# Patient Record
Sex: Female | Born: 1945 | Race: White | Hispanic: No | Marital: Married | Smoking: Former smoker
Health system: Southern US, Community
[De-identification: ages and names within clinical notes are randomized; demographics above are authoritative.]

## PROBLEM LIST (undated history)

## (undated) DIAGNOSIS — I10 Essential (primary) hypertension: Secondary | ICD-10-CM

## (undated) DIAGNOSIS — Z460 Encounter for fitting and adjustment of spectacles and contact lenses: Secondary | ICD-10-CM

## (undated) DIAGNOSIS — T8859XA Other complications of anesthesia, initial encounter: Secondary | ICD-10-CM

## (undated) DIAGNOSIS — E039 Hypothyroidism, unspecified: Secondary | ICD-10-CM

## (undated) DIAGNOSIS — D131 Benign neoplasm of stomach: Secondary | ICD-10-CM

## (undated) DIAGNOSIS — J189 Pneumonia, unspecified organism: Secondary | ICD-10-CM

## (undated) DIAGNOSIS — R569 Unspecified convulsions: Secondary | ICD-10-CM

## (undated) DIAGNOSIS — J8410 Pulmonary fibrosis, unspecified: Secondary | ICD-10-CM

## (undated) DIAGNOSIS — T4145XA Adverse effect of unspecified anesthetic, initial encounter: Secondary | ICD-10-CM

## (undated) DIAGNOSIS — Z800 Family history of malignant neoplasm of digestive organs: Secondary | ICD-10-CM

## (undated) DIAGNOSIS — K219 Gastro-esophageal reflux disease without esophagitis: Secondary | ICD-10-CM

## (undated) DIAGNOSIS — K635 Polyp of colon: Secondary | ICD-10-CM

## (undated) DIAGNOSIS — K222 Esophageal obstruction: Secondary | ICD-10-CM

## (undated) DIAGNOSIS — K52832 Lymphocytic colitis: Secondary | ICD-10-CM

## (undated) DIAGNOSIS — C801 Malignant (primary) neoplasm, unspecified: Secondary | ICD-10-CM

## (undated) DIAGNOSIS — D126 Benign neoplasm of colon, unspecified: Secondary | ICD-10-CM

## (undated) DIAGNOSIS — F329 Major depressive disorder, single episode, unspecified: Secondary | ICD-10-CM

## (undated) DIAGNOSIS — K449 Diaphragmatic hernia without obstruction or gangrene: Secondary | ICD-10-CM

## (undated) DIAGNOSIS — F411 Generalized anxiety disorder: Secondary | ICD-10-CM

## (undated) DIAGNOSIS — R0600 Dyspnea, unspecified: Secondary | ICD-10-CM

## (undated) DIAGNOSIS — D496 Neoplasm of unspecified behavior of brain: Secondary | ICD-10-CM

## (undated) DIAGNOSIS — E669 Obesity, unspecified: Secondary | ICD-10-CM

## (undated) DIAGNOSIS — IMO0002 Reserved for concepts with insufficient information to code with codable children: Secondary | ICD-10-CM

## (undated) DIAGNOSIS — E785 Hyperlipidemia, unspecified: Secondary | ICD-10-CM

## (undated) DIAGNOSIS — F3289 Other specified depressive episodes: Secondary | ICD-10-CM

## (undated) DIAGNOSIS — Z87442 Personal history of urinary calculi: Secondary | ICD-10-CM

## (undated) HISTORY — PX: APPENDECTOMY: SHX54

## (undated) HISTORY — DX: Polyp of colon: K63.5

## (undated) HISTORY — DX: Diaphragmatic hernia without obstruction or gangrene: K44.9

## (undated) HISTORY — DX: Gastro-esophageal reflux disease without esophagitis: K21.9

## (undated) HISTORY — DX: Obesity, unspecified: E66.9

## (undated) HISTORY — DX: Hypothyroidism, unspecified: E03.9

## (undated) HISTORY — DX: Major depressive disorder, single episode, unspecified: F32.9

## (undated) HISTORY — DX: Esophageal obstruction: K22.2

## (undated) HISTORY — DX: Pulmonary fibrosis, unspecified: J84.10

## (undated) HISTORY — DX: Benign neoplasm of colon, unspecified: D12.6

## (undated) HISTORY — PX: BACK SURGERY: SHX140

## (undated) HISTORY — PX: ABDOMINAL HYSTERECTOMY: SHX81

## (undated) HISTORY — PX: CARPAL TUNNEL RELEASE: SHX101

## (undated) HISTORY — PX: EYE SURGERY: SHX253

## (undated) HISTORY — DX: Generalized anxiety disorder: F41.1

## (undated) HISTORY — DX: Reserved for concepts with insufficient information to code with codable children: IMO0002

## (undated) HISTORY — DX: Other specified depressive episodes: F32.89

## (undated) HISTORY — DX: Hyperlipidemia, unspecified: E78.5

## (undated) HISTORY — DX: Lymphocytic colitis: K52.832

## (undated) HISTORY — DX: Benign neoplasm of stomach: D13.1

## (undated) HISTORY — PX: CRANIOTOMY: SHX93

## (undated) HISTORY — DX: Family history of malignant neoplasm of digestive organs: Z80.0

---
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
## 1981-10-29 ENCOUNTER — Encounter (INDEPENDENT_AMBULATORY_CARE_PROVIDER_SITE_OTHER): Payer: Self-pay | Admitting: *Deleted

## 1982-03-04 ENCOUNTER — Encounter (INDEPENDENT_AMBULATORY_CARE_PROVIDER_SITE_OTHER): Payer: Self-pay | Admitting: *Deleted

## 1987-04-19 ENCOUNTER — Encounter (INDEPENDENT_AMBULATORY_CARE_PROVIDER_SITE_OTHER): Payer: Self-pay | Admitting: *Deleted

## 1995-11-10 ENCOUNTER — Encounter (INDEPENDENT_AMBULATORY_CARE_PROVIDER_SITE_OTHER): Payer: Self-pay | Admitting: *Deleted

## 1997-07-27 HISTORY — PX: BRAIN SURGERY: SHX531

## 1997-08-31 ENCOUNTER — Encounter (INDEPENDENT_AMBULATORY_CARE_PROVIDER_SITE_OTHER): Payer: Self-pay | Admitting: *Deleted

## 1997-09-13 ENCOUNTER — Encounter: Payer: Self-pay | Admitting: Internal Medicine

## 1997-11-26 ENCOUNTER — Encounter (INDEPENDENT_AMBULATORY_CARE_PROVIDER_SITE_OTHER): Payer: Self-pay | Admitting: *Deleted

## 1998-01-03 ENCOUNTER — Inpatient Hospital Stay (HOSPITAL_COMMUNITY): Admission: EM | Admit: 1998-01-03 | Discharge: 1998-01-08 | Payer: Self-pay | Admitting: *Deleted

## 1998-01-08 ENCOUNTER — Inpatient Hospital Stay (HOSPITAL_COMMUNITY)
Admission: RE | Admit: 1998-01-08 | Discharge: 1998-02-01 | Payer: Self-pay | Admitting: Physical Medicine and Rehabilitation

## 1998-02-04 ENCOUNTER — Encounter
Admission: RE | Admit: 1998-02-04 | Discharge: 1998-05-05 | Payer: Self-pay | Admitting: Physical Medicine and Rehabilitation

## 1998-04-27 ENCOUNTER — Encounter
Admission: RE | Admit: 1998-04-27 | Discharge: 1998-07-26 | Payer: Self-pay | Admitting: Physical Medicine and Rehabilitation

## 1998-11-26 ENCOUNTER — Encounter (INDEPENDENT_AMBULATORY_CARE_PROVIDER_SITE_OTHER): Payer: Self-pay | Admitting: *Deleted

## 1999-09-22 ENCOUNTER — Encounter
Admission: RE | Admit: 1999-09-22 | Discharge: 1999-12-21 | Payer: Self-pay | Admitting: Physical Medicine and Rehabilitation

## 1999-10-01 ENCOUNTER — Ambulatory Visit (HOSPITAL_COMMUNITY): Admission: RE | Admit: 1999-10-01 | Discharge: 1999-10-01 | Payer: Self-pay | Admitting: Neurosurgery

## 1999-10-01 ENCOUNTER — Encounter: Payer: Self-pay | Admitting: Neurosurgery

## 2001-02-18 ENCOUNTER — Encounter (INDEPENDENT_AMBULATORY_CARE_PROVIDER_SITE_OTHER): Payer: Self-pay | Admitting: *Deleted

## 2002-02-27 ENCOUNTER — Encounter (INDEPENDENT_AMBULATORY_CARE_PROVIDER_SITE_OTHER): Payer: Self-pay | Admitting: *Deleted

## 2002-04-11 ENCOUNTER — Encounter: Admission: RE | Admit: 2002-04-11 | Discharge: 2002-07-10 | Payer: Self-pay | Admitting: *Deleted

## 2003-03-09 ENCOUNTER — Encounter: Payer: Self-pay | Admitting: Neurosurgery

## 2003-03-13 ENCOUNTER — Encounter: Payer: Self-pay | Admitting: Neurosurgery

## 2003-03-13 ENCOUNTER — Inpatient Hospital Stay (HOSPITAL_COMMUNITY): Admission: RE | Admit: 2003-03-13 | Discharge: 2003-03-16 | Payer: Self-pay | Admitting: Neurosurgery

## 2003-05-09 ENCOUNTER — Encounter: Admission: RE | Admit: 2003-05-09 | Discharge: 2003-07-26 | Payer: Self-pay | Admitting: Neurosurgery

## 2004-05-06 ENCOUNTER — Encounter: Payer: Self-pay | Admitting: Internal Medicine

## 2006-09-09 ENCOUNTER — Encounter: Admission: RE | Admit: 2006-09-09 | Discharge: 2006-11-18 | Payer: Self-pay | Admitting: Orthopedic Surgery

## 2006-10-05 ENCOUNTER — Ambulatory Visit: Payer: Self-pay | Admitting: Licensed Clinical Social Worker

## 2006-10-19 ENCOUNTER — Ambulatory Visit: Payer: Self-pay | Admitting: Licensed Clinical Social Worker

## 2006-10-29 ENCOUNTER — Ambulatory Visit: Payer: Self-pay | Admitting: Licensed Clinical Social Worker

## 2006-11-09 ENCOUNTER — Ambulatory Visit: Payer: Self-pay | Admitting: Licensed Clinical Social Worker

## 2006-11-19 ENCOUNTER — Ambulatory Visit: Payer: Self-pay | Admitting: Licensed Clinical Social Worker

## 2006-11-29 ENCOUNTER — Ambulatory Visit: Payer: Self-pay | Admitting: Licensed Clinical Social Worker

## 2006-12-14 ENCOUNTER — Ambulatory Visit: Payer: Self-pay | Admitting: Licensed Clinical Social Worker

## 2007-09-01 ENCOUNTER — Encounter: Admission: RE | Admit: 2007-09-01 | Discharge: 2007-09-01 | Payer: Self-pay | Admitting: Neurosurgery

## 2007-09-12 ENCOUNTER — Ambulatory Visit: Payer: Self-pay | Admitting: Internal Medicine

## 2007-09-30 ENCOUNTER — Ambulatory Visit: Payer: Self-pay | Admitting: Internal Medicine

## 2007-12-16 ENCOUNTER — Encounter: Admission: RE | Admit: 2007-12-16 | Discharge: 2007-12-16 | Payer: Self-pay | Admitting: Orthopedic Surgery

## 2008-11-22 DIAGNOSIS — K649 Unspecified hemorrhoids: Secondary | ICD-10-CM | POA: Insufficient documentation

## 2008-11-22 DIAGNOSIS — D126 Benign neoplasm of colon, unspecified: Secondary | ICD-10-CM | POA: Insufficient documentation

## 2008-11-22 DIAGNOSIS — F411 Generalized anxiety disorder: Secondary | ICD-10-CM | POA: Insufficient documentation

## 2008-11-22 DIAGNOSIS — IMO0002 Reserved for concepts with insufficient information to code with codable children: Secondary | ICD-10-CM

## 2008-11-22 DIAGNOSIS — E785 Hyperlipidemia, unspecified: Secondary | ICD-10-CM

## 2008-11-22 DIAGNOSIS — I499 Cardiac arrhythmia, unspecified: Secondary | ICD-10-CM | POA: Insufficient documentation

## 2008-11-22 DIAGNOSIS — K219 Gastro-esophageal reflux disease without esophagitis: Secondary | ICD-10-CM

## 2008-11-22 DIAGNOSIS — K449 Diaphragmatic hernia without obstruction or gangrene: Secondary | ICD-10-CM

## 2008-11-22 DIAGNOSIS — F329 Major depressive disorder, single episode, unspecified: Secondary | ICD-10-CM

## 2008-12-10 ENCOUNTER — Ambulatory Visit: Payer: Self-pay | Admitting: Internal Medicine

## 2008-12-10 DIAGNOSIS — K648 Other hemorrhoids: Secondary | ICD-10-CM | POA: Insufficient documentation

## 2008-12-10 DIAGNOSIS — K644 Residual hemorrhoidal skin tags: Secondary | ICD-10-CM | POA: Insufficient documentation

## 2008-12-10 DIAGNOSIS — Z860100 Personal history of colon polyps, unspecified: Secondary | ICD-10-CM | POA: Insufficient documentation

## 2010-04-15 ENCOUNTER — Encounter
Admission: RE | Admit: 2010-04-15 | Discharge: 2010-07-01 | Payer: Self-pay | Source: Home / Self Care | Admitting: Family Medicine

## 2010-07-23 ENCOUNTER — Telehealth: Payer: Self-pay | Admitting: Internal Medicine

## 2010-07-24 ENCOUNTER — Ambulatory Visit
Admission: RE | Admit: 2010-07-24 | Discharge: 2010-07-24 | Payer: Self-pay | Source: Home / Self Care | Attending: Internal Medicine | Admitting: Internal Medicine

## 2010-07-24 DIAGNOSIS — R197 Diarrhea, unspecified: Secondary | ICD-10-CM

## 2010-07-25 ENCOUNTER — Encounter: Payer: Self-pay | Admitting: Nurse Practitioner

## 2010-08-28 NOTE — Progress Notes (Signed)
Summary: Triage   Phone Note Call from Patient Call back at Home Phone [phone] Call back at [phone]   Caller: Patient Call For: Dr. Marina Goodell Reason for Call: Talk to Nurse Summary of Call: pt has had diarrhea for two weeks and she wants to been seen ASAP Initial call taken by: [place] Johnson,  July 23, 2010 4:48 PM  Follow-up for Phone Call        Loose stools x 2 weeks using Immodium but even sm. amts. of soft foods precipitate stool. Denies blood in stool and has no pain. Poor appetite.Given appt. with N.P. for this afternoon as Dr.Obadiah Dennard is supervising dr. No hx. of recent antibiotics. Follow-up by: Teryl Lucy RN,  July 24, 2010 8:16 AM

## 2010-08-28 NOTE — Assessment & Plan Note (Signed)
Summary: diarrhea x2 weeks    History of Present Illness Visit Type: Follow-up Visit Primary GI MD: Yancey Flemings MD Primary Provider: Yancey Flemings MD Chief Complaint: Diarrhea for [redacted]  weeks along with rectal bleeding for 2 weeks, Pt had to change to generic Lipitor, Changes in bowel habits after that History of Present Illness:   Patient is a 65 year old white female with a history of GERD,  colon polyps, hypertension, dyslipidemia. Here for a two week history of diarrhea. Having 3-4 loose stools a day, some of which are nocturnal. There is associated rectal bleeding (tissue) but that isn't new for her, she has known hemorrhoids.  She complains of urgency. Her stomach is noisy but she doesn't have any pain.  Immodium does help. No nausea. No fevers.  Recently started generic for Lipitor, no other new medications. She tried stopping the medication but didn't make any difference in the diarrha. No antibiotics in last few months. No sick contacts or out of country travel.  Colon polyps tubular adenoma March 2011   GI Review of Systems      Denies abdominal pain, acid reflux, belching, bloating, chest pain, dysphagia with liquids, dysphagia with solids, heartburn, loss of appetite, nausea, vomiting, vomiting blood, weight loss, and  weight gain.      Reports change in bowel habits, diarrhea, hemorrhoids, and  rectal bleeding.     Denies anal fissure, black tarry stools, constipation, diverticulosis, fecal incontinence, heme positive stool, irritable bowel syndrome, jaundice, light color stool, liver problems, and  rectal pain.    Current Medications (verified): 1)  Lipitor 40 Mg Tabs (Atorvastatin Calcium) .Marland Kitchen.. 1 By Mouth Once Daily 2)  Lipitor 20 Mg Tabs (Atorvastatin Calcium) .Marland Kitchen.. 1 Tablet By Mouth Once Daily 3)  Effexor Xr 75 Mg Xr24h-Cap (Venlafaxine Hcl) .Marland Kitchen.. 1 Tablet By Mouth Once Daily 4)  Hydrochlorothiazide 25 Mg Tabs (Hydrochlorothiazide) .Marland Kitchen.. 1 Tablet By Mouth Once Daily 5)  Caltrate  600+d 600-400 Mg-Unit Tabs (Calcium Carbonate-Vitamin D) .... 2 Tablets By Mouth Once Daily 6)  Vitamin C Cr 1000 Mg Cr-Tabs (Ascorbic Acid) .Marland Kitchen.. 1 Tablet By Mouth Once Daily 7)  Multivitamins  Tabs (Multiple Vitamin) .Marland Kitchen.. 1 Tablet By Mouth Once Daily 8)  Nabumetone 500 Mg Tabs (Nabumetone) .Marland Kitchen.. 1 Tablet By Mouth Two Times A Day 9)  Aspirin Ec 81 Mg Tbec (Aspirin) .Marland Kitchen.. 1 Tablet By Mouth Two Times A Day 10)  Nexium 40 Mg Cpdr (Esomeprazole Magnesium) .Marland Kitchen.. 1 Capsule By Mouth Once Daily 11)  Kls Aller-Tec 10 Mg Tabs (Cetirizine Hcl) 12)  Anusol-Hc 25 Mg Supp (Hydrocortisone Acetate) .... Insert 1 Per Rectum As Needed At Bedtime 13)  Anamantle Hc 3-0.5 % Crea (Lidocaine-Hydrocortisone Ace) .... Apply To Rectum As Needed 14)  Melatonin 5 Mg Caps (Melatonin) .... 2 By Mouth Once Daily  Allergies (verified): 1)  ! 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 1/2 pack a day Alcohol Use - yes: one drink a day Daily Caffeine Use: 4 cups a day Illicit Drug Use - no Patient  does not get regular exercise.   Review of Systems  The patient denies allergy/sinus, anemia, anxiety-new, arthritis/joint pain, back pain, blood in urine, breast changes/lumps, change in vision, confusion, cough, coughing up blood, depression-new, fainting, fatigue,  fever, headaches-new, hearing problems, heart murmur, heart rhythm changes, itching, menstrual pain, muscle pains/cramps, night sweats, nosebleeds, pregnancy symptoms, shortness of breath, skin rash, sleeping problems, sore throat, swelling of feet/legs, swollen lymph glands, thirst - excessive , urination - excessive , urination changes/pain, urine leakage, vision changes, and voice change.    Vital Signs:  Patient profile:   65 year old female Height:      65 inches Weight:      195 pounds  Vitals Entered By: Merri Ray CMA Duncan Dull) (July 24, 2010 3:26 PM)  Physical Exam  General:  Well developed, well nourished, no acute distress. Head:  Normocephalic and atraumatic. Eyes:  Conjunctiva pink, no icterus.  Neck:  no obvious masses  Chest Wall:  Symmetrical;  no deformities or tenderness. Lungs:  Clear throughout to auscultation. Heart:  Regular rate and rhythm;  Soft Murmur Abdomen:  Abdomen soft, nontender, nondistended. No obvious masses or hepatomegaly.Normal bowel sounds.  Rectal:  Huge external and internal hemorrhoids. Msk:  Symmetrical with no gross deformities. Normal posture. Extremities:  No palmar erythema, no edema.  Neurologic:  Alert and  oriented x4;  grossly normal neurologically. Skin:  Intact without significant lesions or rashes. Cervical Nodes:  No significant cervical adenopathy. Psych:  Alert and cooperative. Normal mood and affect.   Impression & Recommendations:  Problem # 1:  DIARRHEA-PRESUMED INFECTIOUS (ICD-009.3) Assessment New Two weeks of diarrhea, rule out infectious etiology. Abdominal exam benign, she looks okay. Will obtain stool studies, treat empirically with Flagyl. Low residue diet until antibiotics complete. Follow up with Dr. Marina Goodell.  Orders: T-C diff by PCR ([id number]) T-Culture, Stool ([id number]) T-Fecal WBC ([id number])  Problem # 2:  FAMILY HX COLON CANCER (ICD-V16.0) Assessment: Comment Only Up to date on her  colonoscopy  Problem # 3:  HEMORRHOIDS-INTERNAL (ICD-455.0) Assessment: Deteriorated Large external and internal hemorrhoids as well. Internal hemorrhoids largely inflamed. Patient continues to have rectal bleeding, especially if she doesn't use Anamantle on daily basis. Restart Analmantle daily for one week    Orders: T-C diff by PCR ([id number]) T-Culture, Stool ([id number]) T-Fecal WBC ([id number])  Patient Instructions: 1)  Please go to lab, basement level. 2)  We sent a perscription  for Metronidazole to [facility] [address] 3)  Continue Anamantle cream for 1 week.  4)  We made you an appointment with Dr Marina Goodell for Sep 15, 2010.   5)  Appointment card given. 6)  The medication list was reviewed and reconciled.  All changed / newly prescribed medications were explained.  A complete medication list was provided to the patient / caregiver. Prescriptions: METRONIDAZOLE 500 MG TABS (METRONIDAZOLE) Take tab 3 times x 10 days  #30 x 0   Entered by:   Lowry Ram NCMA   Authorized by:   Willette Cluster NP   Signed by:   Lowry Ram NCMA on 07/24/2010   Method used:   Electronically to        [facility] [address] #[id number]* (retail)       [address]       [place], [place]  [address]       Ph: [phone]       Fax: [phone]   RxID:   [id number]  ANUSOL-HC 25 MG SUPP (HYDROCORTISONE ACETATE) insert 1 Per Rectum as needed at bedtime  #24 x 0   Entered by:   Lowry Ram NCMA   Authorized by:   Willette Cluster NP   Signed by:   Lowry Ram NCMA on 07/24/2010   Method used:   Electronically to        [facility] [address] #[id number]* (retail)       [address]       [place], [place]  [address]       Ph: [phone]       Fax: [phone]   RxID:   [id number]  Pt decided to use [facility]. I called both perscriptions in.

## 2010-09-15 ENCOUNTER — Ambulatory Visit (INDEPENDENT_AMBULATORY_CARE_PROVIDER_SITE_OTHER): Payer: BC Managed Care – PPO | Admitting: Internal Medicine

## 2010-09-15 ENCOUNTER — Encounter: Payer: Self-pay | Admitting: Internal Medicine

## 2010-09-15 ENCOUNTER — Telehealth: Payer: Self-pay | Admitting: Internal Medicine

## 2010-09-15 DIAGNOSIS — Z9283 Personal history of failed moderate sedation: Secondary | ICD-10-CM

## 2010-09-15 DIAGNOSIS — R197 Diarrhea, unspecified: Secondary | ICD-10-CM

## 2010-09-15 DIAGNOSIS — Z86010 Personal history of colonic polyps: Secondary | ICD-10-CM

## 2010-09-23 NOTE — Procedures (Signed)
Summary: Colonoscopy/[place]  Colonoscopy/[place]   Imported By: Sherian Rein 09/16/2010 12:25:04  _____________________________________________________________________  External Attachment:    Type:   Image     Comment:   External Document

## 2010-09-23 NOTE — Progress Notes (Signed)
Summary[facility]   Imported By: Sherian Rein 09/16/2010 12:30:48  _____________________________________________________________________  External Attachment:    Type:   Image     Comment:   External Document

## 2010-09-23 NOTE — Assessment & Plan Note (Signed)
Summary: Follow up for diarrhea and rectal bleeding     History of Present Illness Visit Type: Follow-up Visit Primary GI MD: Yancey Flemings MD Primary Provider: Leverne Humbles, MD  Requesting Provider: na Chief Complaint: F/u from office visit with Gunnar Fusi for rectal bleeding and diarrhea. 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 She presents today for followup. She was seen in 7 weeks ago for a 2 week history of diarrhea and rectal bleeding. Bleeding secondary to hemorrhoids. Prescribe topical therapy. Stool studies negative except for  lactoferrin. Prescribed metronidazole without help. Stop Lipitor without help. Imodium ineffective. Some abnormal cramping with urgency. 4 episodes of incontinence , most recently last week. history of 4 in her small bowel movements per day. last colonoscopy, for family history of colon cancer in her mother , March 2009. the exam revealed hemorrhoids and  adenomatous polyps.   GI Review of Systems      Denies abdominal pain, acid reflux, belching, bloating, chest pain, dysphagia with liquids, dysphagia with solids, heartburn, loss of appetite, nausea, vomiting, vomiting blood, weight loss, and  weight gain.      Reports change in bowel habits, fecal incontinence, hemorrhoids, and  rectal bleeding.     Denies anal fissure, black tarry stools, constipation, diarrhea, diverticulosis, heme positive stool, irritable bowel syndrome, jaundice, light color stool, liver problems, and  rectal pain.    Current Medications (verified): 1)  Lipitor 40 Mg Tabs (Atorvastatin Calcium) .Marland Kitchen.. 1 By Mouth Once Daily 2)  Lipitor 20 Mg Tabs (Atorvastatin Calcium) .Marland Kitchen.. 1 Tablet By Mouth Once Daily 3)  Effexor Xr 75 Mg Xr24h-Cap (Venlafaxine Hcl) .... 3 Tablets By Mouth Once  Daily 4)  Hydrochlorothiazide 25 Mg Tabs (Hydrochlorothiazide) .Marland Kitchen.. 1 Tablet By Mouth Once Daily 5)  Caltrate 600+d 600-400 Mg-Unit Tabs (Calcium Carbonate-Vitamin D) .Marland Kitchen.. 1 Tablet By Mouth Two Times A Day 6)  Vitamin C Cr 1000 Mg Cr-Tabs (Ascorbic Acid) .Marland Kitchen.. 1 Tablet By Mouth Once Daily 7)  Multivitamins  Tabs (Multiple Vitamin) .Marland Kitchen.. 1 Tablet By Mouth Once Daily 8)  Nabumetone 500 Mg Tabs (Nabumetone) .Marland Kitchen.. 1 Tablet By Mouth Two Times A Day 9)  Aspirin Ec 81 Mg Tbec (Aspirin) .Marland Kitchen.. 1 Tablet By Mouth Two Times A Day 10)  Nexium 40 Mg Cpdr (Esomeprazole Magnesium) .Marland Kitchen.. 1 Capsule By Mouth Once Daily 11)  Kls Aller-Tec 10 Mg Tabs (Cetirizine Hcl) .... One Tablet By Mouth Once Daily 12)  Anusol-Hc 25 Mg Supp (Hydrocortisone Acetate) .... Insert 1 Per Rectum As Needed At Bedtime 13)  Anamantle Hc 3-0.5 % Crea (Lidocaine-Hydrocortisone Ace) .... Apply To Rectum As Needed 14)  Melatonin 5 Mg Caps (Melatonin) .... 2 By Mouth Once Daily 15)  Synthroid 50 Mcg Tabs (Levothyroxine Sodium) .... One Tablet By Mouth Once Daily  Allergies (verified): 1)  ! 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 craniotomy back surgery hysterectomy appendectomy Carpal Tunnel Release  Family History: Reviewed history from 12/10/2008 and no changes required. Family History of Breast Cancer: Mother  Family History of Colon Cancer:Mother  Lung Cancer: Uncle Bone Cancer: Grandmother MI: Father  Social History:  Reviewed history from 12/10/2008 and no changes required. Occupation: Retired Patient is a former smoker. 1/2 pack a day Alcohol Use - yes: one drink a day Daily Caffeine Use: 4 cups a  day Illicit Drug Use - no Patient does not get regular exercise.   Review of Systems  The patient denies allergy/sinus, anemia, anxiety-new, arthritis/joint pain, back pain, blood in urine, breast changes/lumps, change in vision, confusion, cough, coughing up blood, depression-new, fainting, fatigue, fever, headaches-new, hearing problems, heart murmur, heart rhythm changes, itching, menstrual pain, muscle pains/cramps, night sweats, nosebleeds, pregnancy symptoms, shortness of breath, skin rash, sleeping problems, sore throat, swelling of feet/legs, swollen lymph glands, thirst - excessive , urination - excessive , urination changes/pain, urine leakage, vision changes, and voice change.    Vital Signs:  Patient profile:   65 year old female Height:      65 inches Weight:      201 pounds BMI:     33.57 BSA:     1.98 Pulse rate:   72 / minute Pulse rhythm:   regular BP sitting:   128 / 64  (left arm) Cuff size:   regular  Vitals Entered By: Ok Anis CMA (September 15, 2010 1:50 PM)  Physical Exam  General:  Well developed, well nourished, no acute distress. Head:  Normocephalic and atraumatic. Eyes:  PERRLA, no icterus. Mouth:  No deformity or lesions,. Neck:  Supple; no masses or thyromegaly. Lungs:  Clear throughout to auscultation. Heart:  Regular rate and rhythm; no murmurs, rubs,  or bruits. Abdomen:  Soft, nontender and nondistended. No masses, hepatosplenomegaly or hernias noted. Normal bowel sounds. Rectal:   deferred Msk:  Symmetrical with no gross deformities. Normal posture. Pulses:  Normal pulses noted. Extremities:   no edema Neurologic:  Alert and  oriented x   Impression & Recommendations:  Problem # 1:  DIARRHEA (ICD-787.91)  9 week history of change in bowel habits. Now with cramping followed by urgency and loose stools. Some incontinence. suspect post infectious IBS or possibly microscopic colitis.   Plan :   #1. colonoscopy with biopsies. the nature of  the procedure as well as the risks, benefits, and alternatives were reviewed. she understood and agreed to proceed.  movi prep  prescribed. the patient instructed on  its use  #2. Difficult to sedate last time. We'll use propofol  Problem # 2:  PERSONAL HISTORY OF FAILED MODERATE SEDATION (ICD-V15.80) Assessment: Comment Only  Problem # 3:  PERSONAL HX COLONIC POLYPS (ICD-V12.72)  personal history of adenomatous polyps in 2009.  Problem # 4:  FAMILY HX COLON CANCER (ICD-V16.0)  mother  in her 23s  Problem # 5:  HEMORRHOIDS-INTERNAL (ICD-455.0)  likely cause for intermittent bleeding. will assess further colonoscopy.  Other Orders: Colonoscopy (Colon)  Patient Instructions: 1)  colonoscopy with Propoful 10/03/10 9:30 am arrive at 8:30 am 2)  Movi prep instructions given. 3)  Movi prep prescription sent to pharmacy for you to pick up. 4)  Colonoscopy and Flexible Sigmoidoscopy brochure given.  5)  Copy sent to : Leverne Humbles, MD  6)  The medication list was reviewed and reconciled.  All changed / newly prescribed medications were explained.  A complete medication list was provided to the patient / caregiver. Prescriptions: MOVIPREP 100 GM  SOLR (PEG-KCL-NACL-NASULF-NA ASC-C) As per prep instructions.  #1 x 0   Entered by:   Milford Cage NCMA   Authorized by:   Hilarie Fredrickson MD   Signed by:   Britta Mccreedy  Kem Boroughs on 09/15/2010   Method used:   Electronically to        [facility] #[id number]* (retail)       [address]       [place], [place]  [address]       Ph: [phone]       Fax: [phone]   RxID:   [id number]

## 2010-09-23 NOTE — Progress Notes (Signed)
Summary: resend rx    Phone Note Call from Patient Call back at Home Phone [phone]   Caller: Patient Call For: Dr Marina Goodell Summary of Call: rx was sent to the wrong pharmacy [phone] Initial call taken by: Tawni Levy,  September 15, 2010 4:58 PM    Prescriptions: MOVIPREP 100 GM  SOLR (PEG-KCL-NACL-NASULF-NA ASC-C) As per prep instructions.  #1 x 0   Entered by:   Milford Cage NCMA   Authorized by:   Hilarie Fredrickson MD   Signed by:   Russie Gulledge NCMA on 09/17/2010   Method used:   [phone]       [address]       [place], [place]  [address]       Ph: [phone]       Fax: [phone]   RxID:   [id number]

## 2010-09-23 NOTE — Procedures (Signed)
Summary: Colonoscopy/[place]  Colonoscopy/   Imported By: Sherian Rein 09/16/2010 12:21:22  _____________________________________________________________________  External Attachment:    Type:   Image     Comment:   External Document

## 2010-09-23 NOTE — Progress Notes (Signed)
[facility]   Imported By: Sherian Rein 09/16/2010 12:35:06  _____________________________________________________________________  External Attachment:    Type:   Image     Comment:   External Document

## 2010-09-23 NOTE — Letter (Signed)
Summary: [facility] Instructions  [facility]  [address] [place], [place] [address]   Phone: [phone]  Fax: [phone]       SHANEKQUA SCHAPER    1945-09-10    MRN: [id number]        Procedure Day /Date:FRIDAY 10/03/10     Arrival Time:8:30 AM     Procedure Time:9:30 AM     Location of Procedure:                    [facility] (4th Floor)  PREPARATION FOR COLONOSCOPY WITH MOVIPREP WITH PROPOFUL   Starting 5 days prior to your procedure _ _ do not eat nuts, seeds, popcorn, corn, beans, peas,  salads, or any raw vegetables.  Do not take any fiber supplements (e.g. Metamucil, Citrucel, and Benefiber).  THE DAY BEFORE YOUR PROCEDURE         DATE: 10/02/10  DAY: THURSDAY  1.  Drink clear liquids the entire day-NO SOLID FOOD  2.  Do not drink anything colored red or purple.  Avoid juices with pulp.  No orange juice.  3.  Drink at least 64 oz. (8 glasses) of fluid/clear liquids during the day to prevent dehydration and help the prep work efficiently.  CLEAR LIQUIDS INCLUDE: Water Jello Ice Popsicles Tea (sugar ok, no milk/cream) Powdered fruit flavored drinks Coffee (sugar ok, no milk/cream) Gatorade Juice: apple, white grape, white cranberry  Lemonade Clear bullion, consomm, broth Carbonated beverages (any kind) Strained chicken noodle soup Hard Candy                             4.  In the morning, mix first dose of MoviPrep solution:    Empty 1 Pouch A and 1 Pouch B into the disposable container    Add lukewarm drinking water to the top line of the container. Mix to dissolve    Refrigerate (mixed solution should be used within 24 hrs)  5.  Begin drinking the prep at 5:00 p.m. The MoviPrep container is divided by 4 marks.   Every 15 minutes drink the solution down to the next mark (approximately 8 oz) until the full liter is complete.   6.  Follow completed prep with 16 oz of clear liquid of your choice (Nothing red or purple).   Continue to drink clear liquids until bedtime.  7.  Before going to bed, mix second dose of MoviPrep solution:    Empty 1 Pouch A and 1 Pouch B into the disposable container    Add lukewarm drinking water to the top line of the container. Mix to dissolve    Refrigerate  THE DAY OF YOUR PROCEDURE      DATE: 10/03/10 DAY: FRIDAY  Beginning at 4:30 a.m. (5 hours before procedure):         1. Every 15 minutes, drink the solution down to the next mark (approx 8 oz) until the full liter is complete.  2. Follow completed prep with 16 oz. of clear liquid of your choice.    3. You may drink clear liquids until 7:30 AM (2 HOURS BEFORE PROCEDURE).   MEDICATION INSTRUCTIONS  Unless otherwise instructed, you should take regular prescription medications with a small sip of water   as early as possible the morning of your procedure.         OTHER INSTRUCTIONS  You will need a responsible adult at least 66 years of  age to accompany you and drive you home.   This person must remain in the waiting room during your procedure.  Wear loose fitting clothing that is easily removed.  Leave jewelry and other valuables at home.  However, you may wish to bring a book to read or  an iPod/MP3 player to listen to music as you wait for your procedure to start.  Remove all body piercing jewelry and leave at home.  Total time from sign-in until discharge is approximately 2-3 hours.  You should go home directly after your procedure and rest.  You can resume normal activities the  day after your procedure.  The day of your procedure you should not:   Drive   Make legal decisions   Operate machinery   Drink alcohol   Return to work  You will receive specific instructions about eating, activities and medications before you leave.    The above instructions have been reviewed and explained to me by   _______________________    I fully understand and can verbalize these instructions  _____________________________ Date _________

## 2010-09-23 NOTE — Progress Notes (Signed)
[facility]   Imported By: Sherian Rein 09/16/2010 12:32:22  _____________________________________________________________________  External Attachment:    Type:   Image     Comment:   External Document

## 2010-09-23 NOTE — Progress Notes (Signed)
Summary: [facility]   Imported By: Sherian Rein 09/16/2010 12:33:45  _____________________________________________________________________  External Attachment:    Type:   Image     Comment:   External Document

## 2010-09-23 NOTE — Progress Notes (Signed)
Summary: [facility]   Imported By: Sherian Rein 09/16/2010 12:36:21  _____________________________________________________________________  External Attachment:    Type:   Image     Comment:   External Document

## 2010-09-23 NOTE — Procedures (Signed)
Summary: Panendoscopy/  Panendoscopy/   Imported By: Sherian Rein 09/16/2010 12:23:00  _____________________________________________________________________  External Attachment:    Type:   Image     Comment:   External Document

## 2010-09-23 NOTE — Progress Notes (Signed)
Summary: [facility]   Imported By: Sherian Rein 09/16/2010 12:29:47  _____________________________________________________________________  External Attachment:    Type:   Image     Comment:   External Document

## 2010-10-03 ENCOUNTER — Encounter: Payer: Self-pay | Admitting: Internal Medicine

## 2010-10-03 ENCOUNTER — Other Ambulatory Visit: Payer: Self-pay | Admitting: Internal Medicine

## 2010-10-03 ENCOUNTER — Encounter (AMBULATORY_SURGERY_CENTER): Payer: BC Managed Care – PPO | Admitting: Internal Medicine

## 2010-10-03 DIAGNOSIS — K5289 Other specified noninfective gastroenteritis and colitis: Secondary | ICD-10-CM

## 2010-10-03 DIAGNOSIS — R197 Diarrhea, unspecified: Secondary | ICD-10-CM

## 2010-10-03 DIAGNOSIS — Z86010 Personal history of colonic polyps: Secondary | ICD-10-CM

## 2010-10-03 DIAGNOSIS — K648 Other hemorrhoids: Secondary | ICD-10-CM

## 2010-10-03 DIAGNOSIS — Z1211 Encounter for screening for malignant neoplasm of colon: Secondary | ICD-10-CM

## 2010-10-06 ENCOUNTER — Encounter: Payer: Self-pay | Admitting: Internal Medicine

## 2010-10-07 NOTE — Miscellaneous (Signed)
Summary: Levsinex rx  Clinical Lists Changes  Medications: Added new medication of HYOSCYAMINE SULFATE CR 0.375 MG XR12H-TAB (HYOSCYAMINE SULFATE) 1 by mouth two times a day - Signed Rx of HYOSCYAMINE SULFATE CR 0.375 MG XR12H-TAB (HYOSCYAMINE SULFATE) 1 by mouth two times a day;  #60 x 3;  Signed;  Entered by: Jennye Boroughs RN;  Authorized by: Hilarie Fredrickson MD;  Method used: [facility] [phone]*, [address] [place], [place], [place]  [address], Ph: [phone], Fax: [phone]    Prescriptions: HYOSCYAMINE SULFATE CR 0.375 MG XR12H-TAB (HYOSCYAMINE SULFATE) 1 by mouth two times a day  #60 x 3   Entered by:   Jennye Boroughs RN   Authorized by:   Hilarie Fredrickson MD   Signed by:   Jennye Boroughs RN on 10/03/2010   Method used:   Electronically to        [facility] [phone]* (retail)       [address]       [place], [place]  [address]       Ph: [phone]       Fax: [phone]   RxID:   [id number]

## 2010-10-07 NOTE — Procedures (Addendum)
Summary: Colonoscopy  Patient: [name] Note: All result statuses are Final unless otherwise noted.  Tests: (1) Colonoscopy (COL)   COL Colonoscopy           DONE     [facility]     [address]     [place], [place]  [address]          COLONOSCOPY PROCEDURE REPORT          PATIENT:  [name], [name]  MR#:  [id number]     BIRTHDATE:  May 09, 1946, 64 yrs. old  GENDER:  female     ENDOSCOPIST:  Wilhemina Bonito. Eda Keys, MD     REF. BY:  Office     PROCEDURE DATE:  10/03/2010     PROCEDURE:  Colonoscopy with biopsies     ASA CLASS:  Class II     INDICATIONS:  history of pre-cancerous (adenomatous) colon polyps,     surveillance and high-risk screening, unexplained diarrhea, family     history of colon cancer ; mom 46s CRC; inex [address]147 w/ TAs     MEDICATIONS:   MAC sedation, administered by CRNA, propofol     (Diprivan) 360 mg IV          DESCRIPTION OF PROCEDURE:   After the risks benefits and     alternatives of the procedure were thoroughly explained, informed     consent was obtained.  Digital rectal exam was performed and     revealed external hemorrhoids.   The LB CF-[id number]     endoscope was introduced through the anus and advanced to the     cecum, which was identified by both the appendix and ileocecal     valve, without limitations.Time to cecum = 7:06 min.  The quality     of the prep was excellent, using MoviPrep.  The instrument was     then slowly withdrawn (time = 13:19 min) as the colon was fully     examined.     <<PROCEDUREIMAGES>>          FINDINGS:  The terminal ileum appeared normal x 2cm.  A normal     appearing cecum, ileocecal valve, and appendiceal orifice were     identified. The ascending, hepatic flexure, transverse, splenic     flexure, descending, sigmoid colon, and rectum appeared     unremarkable.   Retroflexed views in the rectum revealed internal     hemorrhoids. Random colon bx taken.   The scope was then withdrawn     from the  patient and the procedure completed.          COMPLICATIONS:  None     ENDOSCOPIC IMPRESSION:     1) Normal terminal ileum     2) Normal colon     3) Internal and external hemorrhoids     RECOMMENDATIONS:     1) Await biopsy results     2) Levsinex 0.375 g po bid; # 60; 3 refills     3) Follow up colonoscopy in 5 years (MAC)     4) Out patient follow-up in 6 weeks.     ______________________________     Wilhemina Bonito. Eda Keys, MD          CC:  Bernadette Hoit, MD; The Patient          n.     eSIGNED:   Wilhemina Bonito. Eda Keys at 10/03/2010 10:44 AM  [place], [place], [phone]  Note: An exclamation mark (!) indicates a result that was not dispersed into the flowsheet. Document Creation Date: 10/03/2010 10:44 AM _______________________________________________________________________  (1) Order result status: Final Collection or observation date-time: 10/03/2010 10:34 Requested date-time:  Receipt date-time:  Reported date-time:  Referring Physician:   Ordering Physician: Fransico Setters [phone]) Specimen Source:  Source: Launa Grill Order Number: [id number] Lab site:   Appended Document: Colonoscopy recall 5 yrs     Procedures Next Due Date:    Colonoscopy: 09/2015

## 2010-10-11 ENCOUNTER — Encounter: Payer: Self-pay | Admitting: Internal Medicine

## 2010-10-14 NOTE — Letter (Addendum)
Summary: Patient Notice- Colon Biospy Results   Gastroenterology  [address] [place], [place] [address]   Phone: [phone]  Fax: [phone]        October 11, 2010 MRN: [id number]    [facility] [address] 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 Generally this is not a serious condition, but likely explains your problems with ongoing diarrhea.   Additional information/recommendations:   1. My nurse should have called you and initiated Entocort (budesonide) therapy. If not, please call.  2. Keep follow up office visit in about 6 weeks.  3. You should have a repeat colonoscopy examination because of your history of polyp in 5 years.   Please call us if you are having persistent problems or have questions about your condition that have not been fully answered at this time.  Sincerely,  Hilarie Fredrickson MD   This letter has been electronically signed by your physician.  Appended Document: Patient Notice- Colon Biospy Results letter mailed

## 2010-10-14 NOTE — Letter (Signed)
Summary: Appt Reminder 2  [facility]  [address] [place], [place] [address]   Phone: [phone]  Fax: [phone]        October 06, 2010 MRN: [id number]    [facility] [address] [place], [place]  [address]    Dear Ms. [name],   You have a return appointment with Dr. Marina Goodell on 11/17/10 at 10am.  Please remember to bring a complete list of the medicines you are taking, your insurance card and your co-pay.  If you have to cancel or reschedule this appointment, please call before 5:00 pm the evening before to avoid a cancellation fee.  If you have any questions or concerns, please call [phone].    Sincerely,    Selinda Michaels RN  Appended Document: Appt Reminder 2 Letter is mailed to the patient's home address

## 2010-10-27 ENCOUNTER — Telehealth: Payer: Self-pay | Admitting: Internal Medicine

## 2010-10-29 NOTE — Telephone Encounter (Signed)
Pt. Is wanting to know if she can have Rx.for Lomotil.  She is has been taking the Entocort and Hyoscyamine as prescribed but is having several loose stools per day.  She wants to try it and see if it helps.  Please advise.  Thanks.

## 2010-10-29 NOTE — Telephone Encounter (Signed)
Yes. Lomotil disp # 60; 1-2 po qd prn (1 refill)

## 2010-10-30 MED ORDER — DIPHENOXYLATE-ATROPINE 2.5-0.025 MG PO TABS: 1.0000 | ORAL_TABLET | Freq: Every day | ORAL | Status: DC | PRN

## 2010-10-30 MED ORDER — DIPHENOXYLATE-ATROPINE 2.5-0.025 MG PO TABS: 1.0000 | ORAL_TABLET | Freq: Every day | ORAL | Status: AC | PRN

## 2010-10-30 NOTE — Telephone Encounter (Signed)
Printed and faxed to pharmacy.  Pt. Notified.  L/M for pt. To call with name of pharmacy to fax to

## 2010-10-30 NOTE — Telephone Encounter (Signed)
Rx. Printed and faxed to [facility]

## 2010-10-30 NOTE — Telephone Encounter (Signed)
[facility]

## 2010-11-17 ENCOUNTER — Ambulatory Visit (INDEPENDENT_AMBULATORY_CARE_PROVIDER_SITE_OTHER): Payer: BC Managed Care – PPO | Admitting: Internal Medicine

## 2010-11-17 ENCOUNTER — Encounter: Payer: Self-pay | Admitting: Internal Medicine

## 2010-11-17 VITALS — BP 126/74 | HR 88 | Ht 65.0 in | Wt 208.0 lb

## 2010-11-17 DIAGNOSIS — Z860100 Personal history of colon polyps, unspecified: Secondary | ICD-10-CM

## 2010-11-17 DIAGNOSIS — K5289 Other specified noninfective gastroenteritis and colitis: Secondary | ICD-10-CM

## 2010-11-17 DIAGNOSIS — K644 Residual hemorrhoidal skin tags: Secondary | ICD-10-CM

## 2010-11-17 DIAGNOSIS — R197 Diarrhea, unspecified: Secondary | ICD-10-CM

## 2010-11-17 NOTE — Patient Instructions (Signed)
Resume Entocort daily as prescribed by Dr. Marina Goodell Resume Hemorrhoid medication prescribed by Dr. Marina Goodell. Follow-up in 2 months for follow-up.

## 2010-11-17 NOTE — Progress Notes (Signed)
HISTORY OF PRESENT ILLNESS:  [name] is a 65 y.o. female with the below listed medical history who was evaluated in February 2012 for chronic diarrhea. She subsequently underwent colonoscopy with ileal intubation 10/03/2010. The examination was normal except for internal and external hemorrhoids. Random colon biopsies returned showing changes consistent with lymphocytic colitis. On 10/11/2010 she was prescribed Entocort 9 mg daily. She took for approximately one week, then, for no good reason, discontinue the medication. She requested Lomotil. She takes Lomotil one daily. This seems to help her bowels. However, off medication, recurrent diarrhea. Also complaining of difficulties with her hemorrhoids. Suppositories and cream previously prescribed. She is not using either. No new complaints.  REVIEW OF SYSTEMS:  All non-GI ROS negative or unchanged from previous visit.  Past Medical History  Diagnosis Date  . Benign neoplasm of colon   . Depressive disorder, not elsewhere classified   . Anxiety state, unspecified   . Other and unspecified hyperlipidemia   . Cardiac dysrhythmia, unspecified   . Complications affecting other specified body systems, hypertension   . Esophageal reflux   . Obesity   . Hypothyroidism   . Hemorrhoids   . Colon polyps     hyperplastic  . Family history of colon cancer mother    Past Surgical History  Procedure Date  . Craniotomy   . Back surgery   . Abdominal hysterectomy   . Appendectomy   . Carpal tunnel release     Social History [name]  reports that she has quit smoking. She does not have any smokeless tobacco history on file. She reports that she drinks alcohol. She reports that she does not use illicit drugs.  family history includes Bone cancer in an unspecified family member; Breast cancer in her mother; Colon cancer in her mother; Heart attack in her father; and Lung cancer in an unspecified family member.  Allergies  Allergen  Reactions  . Sulfonamide Derivatives        PHYSICAL EXAMINATION:  Vital signs: BP 126/74  Pulse 88  Ht 5\' 5"  (1.651 m)  Wt 208 lb (94.348 kg)  BMI 34.61 kg/m2 General: Well-developed, well-nourished, no acute distress HEENT: Sclerae are anicteric, conjunctiva pink. Oral mucosa intact Lungs: Clear Heart: Regular Abdomen: soft, nontender, nondistended, no obvious ascites, no peritoneal signs, normal bowel sounds. No organomegaly. Extremities: No edema Psychiatric: alert and oriented x3. Cooperative   ASSESSMENT:  #1. Lymphocytic colitis. This explains her chronic diarrhea. #2. Internal and external hemorrhoids. Symptomatic #3. History of adenomatous polyps. No neoplasia on recent exam.   PLAN:  #1. Resume Entocort 9 mg daily #2. Lomotil when necessary. Might be able to eliminate if Entocort therapy effective #3. Resume her more medications and sitz baths #4. Routine office followup in 2 months. Contact the office in the interim for questions or problems #5. Routine surveillance colonoscopy around March 2017

## 2010-12-09 NOTE — Assessment & Plan Note (Signed)
[facility]                         GASTROENTEROLOGY OFFICE NOTE   Mackenzie, Brady                       MRN:          [id number]  DATE:09/12/2007                            DOB:          Apr 17, 1946    REASON FOR CONSULTATION:  Rectal bleeding.   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  She presents today regarding rectal bleeding.  The  patient does have a family history of colon cancer, and last underwent  colonoscopy in October of 2005.  She was found to have external  hemorrhoids, diverticulosis, and a 4-mm sessile sigmoid colon polyp  which was removed and found to be hyperplastic.  She reports to me  problems with intermittent rectal bleeding over the past 2 years.  However, this seems to be worse in the past 4 months.  As well, she has  had change in her bowel habits.  Previously regular bowel movements in  the morning after a cup of coffee, now more erratic bowel movements with  varying frequency.  She denies any abdominal pain or rectal pain  associated with the bleeding.  Blood has been noticed on the tissue, in  the bowl, and mixed with stool.  She has had no weight loss.   PAST MEDICAL HISTORY:  As above.   PAST SURGICAL HISTORY:  1. Status post craniotomy for removal of benign brain tumor about 10      years ago.  2. Status post back surgery.  3. Status post hysterectomy.  4. Status post appendectomy.  5. Status post carpel tunnel release.   ALLERGIES:  The patient is allergic to SULFA and SHELLFISH.   CURRENT MEDICATIONS:  1. Nasacort.  2. Lipitor 60 mg daily.  3. Effexor 75 mg daily.  4. Hydrochlorothiazide 25 mg daily.  5. Caltrate.  6. Vitamin C.  7. Multivitamin.  8. Nabumetone 500 mg b.i.d.  9. Aspirin 81 mg daily.  10.Nexium  40 mg daily.  11.Aller-Tec once daily.   FAMILY HISTORY:  Mother with colon cancer diagnosed in her late 5s.  Father with heart disease and diabetes.   SOCIAL HISTORY:  The patient is married with 3 children.  She has a high  school education.  She lives with her husband and she works as a Pensions consultant  for [employer].  She smokes a half a pack of cigarettes per day.  Occasionally uses alcohol.   REVIEW OF SYSTEMS:  Per diagnostic evaluation form.   PHYSICAL EXAMINATION:  A well-appearing female in no acute distress.  Blood pressure is 140/82, heart rate is 72, weight is 204.6 pounds.  She  is 5 feet 5 inches in height.  HEENT:  Sclerae anicteric, conjunctivae are pink, oral mucosa is intact,  no adenopathy.  LUNGS:  Clear.  HEART:  Regular.  ABDOMEN:  Soft without tenderness, masses or hernia.  Good bowel sounds  heard.  EXTREMITIES:  Without edema.  RECTAL EXAM:  Reveals non-inflamed external hemorrhoids.  Also appears  to be some internal hemorrhoids.  Stool is brown, soft, Hemoccult  negative.   IMPRESSION:  1. Recurrent rectal bleeding, likely due to hemorrhoids.  Today      hemorrhoids not inflamed appearing, and stool Hemoccult negative.  2. A change in bowel habits, nonspecific.  3. Family history of colon cancer.  4. Gastroesophageal reflux disease, currently asymptomatic on Nexium.  5. History of diverticulosis and hyperplastic colon polyp on most      recent colonoscopy.  6. Multiple general medical problems, under the care of Dr. Riley Nearing.   RECOMMENDATIONS:  1. Prescribe Anusol suppositories for presumed bleeding due to      hemorrhoids.  2. Schedule colonoscopy to rule out other causes for rectal bleeding.      The nature of the procedure, as well as the risks, benefits, and      alternatives were reviewed.  She understood and agreed to proceed.  3. Recommend fiber supplementation in the form of Metamucil for      irregular bowel habits.  4. Ongoing general medical  care with Dr. Riley Nearing.     [name]. Marina Goodell, MD  Electronically Signed    JNP/MedQ  DD: 09/12/2007  DT: 09/13/2007  Job #: [id number]   cc:   Mackenzie Bales, MD

## 2010-12-12 NOTE — Discharge Summary (Signed)
   NAMESHARIECE, VIVEIROS                          ACCOUNT NO.:  [id number]   MEDICAL RECORD NO.:  [id number]                   PATIENT TYPE:  INP   LOCATION:  3019                                 FACILITY:  [facility]   PHYSICIAN:  Hilda Lias, M.D.                DATE OF BIRTH:  [birth year]   DATE OF ADMISSION:  03/13/2003  DATE OF DISCHARGE:  03/16/2003                                 DISCHARGE SUMMARY   ADMISSION DIAGNOSES:  1. 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  2. Left L5-S1 synovial cyst   DISCHARGE DIAGNOSES:  1. 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  2. Left L5-S1 synovial cyst.   CLINICAL HISTORY:  The patient was admitted because of back and left leg  pain.  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   LABORATORY DATA:  Normal.   HOSPITAL COURSE:  The patient was taken to surgery where a left L5  hemilaminectomy with removal of herniated disk and foraminotomy and removal  of cyst was done.  After surgery, the patient complained of back and left  leg pain.  She had been seen by physical therapy and occupational therapy,  and she is feeling better today.  The pain has decreased.  She has not  __________of weakness or spasticity because of the brain tumor.  The wound  looks fine.  She is being discharged today.   CONDITION ON DISCHARGE:  Improvement.   MEDICATIONS:  Percocet, Neurontin, Diazepam.   DIET:  Regular.   ACTIVITY:  Not to drive for at least two weeks.   FOLLOWUP:  To be seen by me in 4-5 weeks.                                                    Hilda Lias, M.D.   EB/MEDQ  D:  03/16/2003  T:  03/17/2003  Job:  [id number]

## 2010-12-12 NOTE — H&P (Signed)
   [name], [name]                          ACCOUNT NO.:  [id number]   MEDICAL RECORD NO.:  [id number]                   PATIENT TYPE:  INP   LOCATION:  3172                                 FACILITY:  [facility]   PHYSICIAN:  Hilda Lias, M.D.                DATE OF BIRTH:  [birth year]   DATE OF ADMISSION:  03/13/2003  DATE OF DISCHARGE:                                HISTORY & PHYSICAL   Ms. [name] is a lady who came to see me because of back pain with radiation to  the left thigh and down to the knee which is getting worse.  The patient was  seen in the emergency room at [facility] and later on was seen by  [place] in the office.  The pain, when she came to see me about 10 days ago, was  going all the way down to the foot associated with weakness.  In the past,  this lady had a right brain tumor which resulted in some weakness in the  left foot.   PAST MEDICAL HISTORY:  1. Craniotomy many years ago.  2. Appendectomy.  3. Hysterectomy.   ALLERGIES:  SULFA.   SOCIAL HISTORY:  Positive for smoking and drinking socially.   PHYSICAL EXAMINATION:  GENERAL:  When the patient came to my office, she was  limping from the left leg.  HEENT:  There is a scar from previous surgery.  NECK:  Normal.  LUNGS: Clear.  CARDIAC:  Heart sounds normal.  ABDOMEN:  Normal.  EXTREMITIES:  Normal pulses.  NEUROLOGIC:  Mental status normal.  Cranial nerves normal.  Strength: She  has weak dorsiflexion and plantar flexion with some rigidity that could be  secondary to the old injury from the brain tumor.  Sensation normal.  Reflexes: She has hyperactivity with increased left ankle jerk.   LABORATORY DATA:  MRI showed that she has a severe case of arthropathy at  the level of 4-5 and 5-1 with synovial cyst.  There is evidence of  subluxation with flexion or extension.   CLINICAL IMPRESSION:  1. 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  2. Left L5-S1 herniated disk with a synovial cyst.    RECOMMENDATIONS:  The patient wants to proceed with surgery.  The procedure  will be a left L5 hemilaminectomy with diskectomy and removal of synovial  cyst.  She knows about the risks of infection, CSF leak, worsening of pain,  paralysis, need for further surgery.                                                Hilda Lias, M.D.    EB/MEDQ  D:  03/13/2003  T:  03/13/2003  Job:  [id number]

## 2010-12-12 NOTE — Op Note (Signed)
NAMELAKEIA, BRADSHAW                          ACCOUNT NO.:  [id number]   MEDICAL RECORD NO.:  [id number]                   PATIENT TYPE:  INP   LOCATION:  3019                                 FACILITY:  [facility]   PHYSICIAN:  Hilda Lias, M.D.                DATE OF BIRTH:  [birth year]   DATE OF PROCEDURE:  03/13/2003  DATE OF DISCHARGE:                                 OPERATIVE REPORT   PREOPERATIVE DIAGNOSES:  1. 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  2. Left L5-S1 herniated disk plus synovial cyst.   POSTOPERATIVE DIAGNOSES:  1. 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  2. Left L5-S1 herniated disk plus synovial cyst.   PROCEDURE:  Left L5 hemilaminectomy, left L4-5 diskectomy with removal of  free fragment, left L5-S1 diskectomy with removal of calcified synovial  cyst.   SURGEON:  Hilda Lias, M.D.   CLINICAL HISTORY:  Ms. Albright was admitted because of back pain with radiation  to the left leg. The pain is getting worse up to the point that she is  having quite a bit of weakness on the left side. The patient previously had  a brain tumor which left her some weakness in the left foot. Nevertheless,  the pain and the weakness are different. X-rays showed some changes at the  level of L5-S1 and L4-5 and surgery was advised.   DESCRIPTION OF PROCEDURE:  The patient then was taken to the OR and a  midline incision from L4-5 down to S1 was made. Muscle and fascia were  retracted laterally. Then, we found that the patient has quite a bit of  facet arthropathy to the point and there was no normal anatomy with the  lamina. We started drilling the lamina of L5 all the way until we did  hemilaminectomy. We found that in this, the patient has a calcified  ligament.  Removal was made with the help of the microscope. Then we  identified the S1 nerve root. We followed the S1 nerve root into the foramen  and later on proximally into the disk space. There was a bulging disk. We  entered the disk with doing  total gross diskectomy. Then, our attention was  at the level of L4-5. The L5 nerve root was surrounded by osteophytes and it  was swollen and reddish. Retraction finally at the L5 was made and indeed  there was a herniated disk with fragment going to the body of L4. An  incision was made and total gross diskectomy medial and laterally was  achieved. At the end, we have good decompression of the L5-S1 as well as the  L4 nerve root. Having done this, the area was irrigated. __________ and Depo-  Medrol were left in the dural space and the wound was closed with Vicryl and  Steri-Strips. The husband knew there was a high possibility that this lady  in the future may require fusion.  Hilda Lias, M.D.    EB/MEDQ  D:  03/13/2003  T:  03/14/2003  Job:  [id number]

## 2010-12-23 NOTE — Telephone Encounter (Signed)
Taken care of

## 2011-01-15 ENCOUNTER — Encounter: Payer: Self-pay | Admitting: Internal Medicine

## 2011-01-15 ENCOUNTER — Ambulatory Visit (INDEPENDENT_AMBULATORY_CARE_PROVIDER_SITE_OTHER): Payer: BC Managed Care – PPO | Admitting: Internal Medicine

## 2011-01-15 VITALS — BP 140/72 | HR 80 | Ht 65.0 in | Wt 211.4 lb

## 2011-01-15 DIAGNOSIS — Z86010 Personal history of colonic polyps: Secondary | ICD-10-CM

## 2011-01-15 DIAGNOSIS — K5289 Other specified noninfective gastroenteritis and colitis: Secondary | ICD-10-CM

## 2011-01-15 DIAGNOSIS — K52832 Lymphocytic colitis: Secondary | ICD-10-CM

## 2011-01-15 DIAGNOSIS — K219 Gastro-esophageal reflux disease without esophagitis: Secondary | ICD-10-CM

## 2011-01-15 NOTE — Progress Notes (Signed)
HISTORY OF PRESENT ILLNESS:  [name] is a 65 y.o. female who is followed in this office for GERD, adenomatous colon polyps, and lymphocytic colitis diagnosed March 2012. Last seen in the office 11/17/2010. At that time, on Lomotil only. She was prescribed and initiated Entocort 9 mg daily. She presents for followup. At this time, she is markedly improved. She continues on Entocort 9 mg daily as well as Lomotil, approximately once daily. She generally has 1 bowel movement per day which is formed. She may have up to 3 bowel movements per day, but may go several days without a bowel movement. She's had no further difficulties with urgency or incontinence. She is pleased. She is tolerating her medications without side effects. No new issues or complaints.  REVIEW OF SYSTEMS:  All non-GI ROS negative at this time.  Past Medical History  Diagnosis Date  . Benign neoplasm of colon   . Depressive disorder, not elsewhere classified   . Anxiety state, unspecified   . Other and unspecified hyperlipidemia   . Cardiac dysrhythmia, unspecified   . Complications affecting other specified body systems, hypertension   . Esophageal reflux   . Obesity   . Hypothyroidism   . Hemorrhoids   . Colon polyps     hyperplastic  . Family history of colon cancer mother  . Lymphocytic colitis     Past Surgical History  Procedure Date  . Craniotomy   . Back surgery   . Abdominal hysterectomy   . Appendectomy   . Carpal tunnel release     right    Social History [name]  reports that she quit smoking about 4 months ago. She does not have any smokeless tobacco history on file. She reports that she drinks alcohol. She reports that she does not use illicit drugs.  family history includes Bone cancer in her maternal grandmother; Breast cancer in her mother; Colon cancer in her mother; Heart attack in her father; and Lung cancer in her maternal uncle.  Allergies  Allergen Reactions  . Sulfonamide  Derivatives        PHYSICAL EXAMINATION: Vital signs: BP 140/72  Pulse 80  Ht 5\' 5"  (1.651 m)  Wt 211 lb 6.4 oz (95.89 kg)  BMI 35.18 kg/m2 General: Well-developed, well-nourished, no acute distress HEENT: Sclerae are anicteric, conjunctiva pink. Oral mucosa intact Lungs: Clear Heart: Regular Abdomen: soft, nontender, nondistended, no obvious ascites, no peritoneal signs, normal bowel sounds. No organomegaly. Extremities: No edema Psychiatric: alert and oriented x3. Cooperative      ASSESSMENT:  #1. Lymphocytic colitis. Currently asymptomatic on medical therapy #2. GERD. Asymptomatic on Nexium #3. History of adenomatous polyps. No recurrent neoplasia on recent colonoscopy   PLAN:  #1. Continue Entocort 9 mg daily for one month, then taper by 3 mg each month thereafter until discontinuing the medication. These instructions were written out for her by hand #2. Lomotil when necessary #3. Continue reflux precautions and Nexium #4. Surveillance colonoscopy around March 2017 #5. Routine GI followup in 3 months

## 2011-01-15 NOTE — Patient Instructions (Signed)
Office visit in 3 months. Call if needed. Follow Entocort instructions given by Dr. Marina Goodell.

## 2011-04-01 ENCOUNTER — Telehealth: Payer: Self-pay | Admitting: Internal Medicine

## 2011-04-01 NOTE — Telephone Encounter (Signed)
1. Continue on dose of Entocort to control diarrhea 2. Continue suppositories and cream for hemorrhoids 3. Sitz baths bid 4. Add fiber supplement such as metamucil 2 tablespoons in 12 oz water daily

## 2011-04-01 NOTE — Telephone Encounter (Signed)
Pt aware of Dr. Perry's recommendations. 

## 2011-04-01 NOTE — Telephone Encounter (Signed)
Pt has h/o Lymphocytic colitis, last seen on 01/15/11. Pt is supposed to follow-up in Sept. Pt was taking entocort 9mg  daily for 1 month and tapering down by 3mg  each month until discontinuing. Pt states she was down to 1 pill a day and started having 4-5 stools a day. She has started taking 6mg  of entocort in the am, she reports she is not taking any lomotil. All of the bowel movements have caused her hemorrhoids to flare. She is having pain and bleeding. She states she is using Anusol HC supp and some Lidocaine/cortisone cream but they are not helping. Dr. Marina Goodell please advise.

## 2011-04-15 ENCOUNTER — Encounter: Payer: Self-pay | Admitting: *Deleted

## 2011-04-16 ENCOUNTER — Encounter: Payer: Self-pay | Admitting: Internal Medicine

## 2011-04-16 ENCOUNTER — Ambulatory Visit (INDEPENDENT_AMBULATORY_CARE_PROVIDER_SITE_OTHER): Payer: BC Managed Care – PPO | Admitting: Internal Medicine

## 2011-04-16 VITALS — BP 114/84 | HR 84 | Ht 65.0 in | Wt 212.0 lb

## 2011-04-16 DIAGNOSIS — K52832 Lymphocytic colitis: Secondary | ICD-10-CM

## 2011-04-16 DIAGNOSIS — K5289 Other specified noninfective gastroenteritis and colitis: Secondary | ICD-10-CM

## 2011-04-16 DIAGNOSIS — Z860100 Personal history of colon polyps, unspecified: Secondary | ICD-10-CM

## 2011-04-16 DIAGNOSIS — K648 Other hemorrhoids: Secondary | ICD-10-CM

## 2011-04-16 DIAGNOSIS — K644 Residual hemorrhoidal skin tags: Secondary | ICD-10-CM

## 2011-04-16 MED ORDER — PRAMOXINE-HC 1-1 % EX CREA: TOPICAL_CREAM | CUTANEOUS | Status: AC

## 2011-04-16 MED ORDER — HYDROCORTISONE ACETATE 25 MG RE SUPP: 25.0000 mg | Freq: Every evening | RECTAL | Status: DC | PRN

## 2011-04-16 NOTE — Progress Notes (Signed)
HISTORY OF PRESENT ILLNESS:  [name] is a 65 y.o. female with the below listed medical history who presents today for followup regarding management of lymphocytic colitis diagnosed in March of 2012. She was last seen in June 2012. At that time she was on Entocort 9 mg daily and Lomotil one daily, reporting one formed bowel movement daily. She tapered Entocort as directed. However, at the 3 mg daily dose she experienced recurrent diarrhea. She returned to Entocort 6 mg daily and now reports one formed bowel movement daily. She is not requiring Lomotil. Her only other complaint is that of intermittent symptomatic hemorrhoids. She takes Metamucil, which he states helps.  REVIEW OF SYSTEMS:  All non-GI ROS negative.  Past Medical History  Diagnosis Date  . Benign neoplasm of colon   . Depressive disorder, not elsewhere classified   . Anxiety state, unspecified   . Other and unspecified hyperlipidemia   . Cardiac dysrhythmia, unspecified   . Complications affecting other specified body systems, hypertension   . Esophageal reflux   . Obesity   . Hypothyroidism   . Hemorrhoids   . Colon polyps     hyperplastic  . Family history of colon cancer mother  . Lymphocytic colitis     Past Surgical History  Procedure Date  . Craniotomy   . Back surgery   . Abdominal hysterectomy   . Appendectomy   . Carpal tunnel release     right    Social History [name]  reports that she quit smoking about 7 months ago. She has never used smokeless tobacco. She reports that she drinks alcohol. She reports that she does not use illicit drugs.  family history includes Bone cancer in her maternal grandmother; Breast cancer in her mother; Colon cancer in her mother; Heart attack in her father; and Lung cancer in her maternal uncle.  Allergies  Allergen Reactions  . Sulfonamide Derivatives        PHYSICAL EXAMINATION: Vital signs: BP 114/84  Pulse 84  Ht 5\' 5"  (1.651 m)  Wt 212 lb  (96.163 kg)  BMI 35.28 kg/m2  SpO2 99% General: Well-developed, well-nourished, no acute distress Abdomen: Not reexamined. Psychiatric: alert and oriented x3. Cooperative    ASSESSMENT:  #1. Lymphocytic colitis. Currently asymptomatic on Entocort 6 mg daily as monotherapy #2. Hematocrit hemorrhoids #3. History of adenomatous colon polyps and a family history of colon cancer in her mother at age 80s   PLAN:  #1. Continue Entocort 6 mg daily. We again discussed attempts at weaning the drug, to find the lowest dose to control symptoms. #2. Prescribe Anusol suppositories and Analpram cream for hemorrhoids #3. Continue Metamucil #4. Surveillance colonoscopy around March 2012 #5. Routine GI followup in 6 months. Contact the office in the interim for any questions or problems

## 2011-04-16 NOTE — Patient Instructions (Signed)
Anusol Suppositories and Analpram sent to your pharmacy. Follow-up in 6 months

## 2011-11-05 ENCOUNTER — Ambulatory Visit (INDEPENDENT_AMBULATORY_CARE_PROVIDER_SITE_OTHER): Payer: BC Managed Care – PPO | Admitting: Internal Medicine

## 2011-11-05 ENCOUNTER — Encounter: Payer: Self-pay | Admitting: Internal Medicine

## 2011-11-05 VITALS — BP 114/74 | HR 88 | Ht 65.0 in | Wt 198.5 lb

## 2011-11-05 DIAGNOSIS — K52832 Lymphocytic colitis: Secondary | ICD-10-CM

## 2011-11-05 DIAGNOSIS — K5289 Other specified noninfective gastroenteritis and colitis: Secondary | ICD-10-CM

## 2011-11-05 DIAGNOSIS — K219 Gastro-esophageal reflux disease without esophagitis: Secondary | ICD-10-CM

## 2011-11-05 DIAGNOSIS — K644 Residual hemorrhoidal skin tags: Secondary | ICD-10-CM

## 2011-11-05 DIAGNOSIS — Z86010 Personal history of colonic polyps: Secondary | ICD-10-CM

## 2011-11-05 DIAGNOSIS — K648 Other hemorrhoids: Secondary | ICD-10-CM

## 2011-11-05 NOTE — Patient Instructions (Signed)
Please follow up in one year 

## 2011-11-05 NOTE — Progress Notes (Signed)
HISTORY OF PRESENT ILLNESS:  [name] is a 66 y.o. female with the below listed medical history who is followed in this office for GERD, hemorrhoids, and lymphocytic colitis. She presents today for routine followup regarding management of these issues. She was last seen 04/16/2011. At that time, she was on Entocort 6 mg daily for her lymphocytic colitis. She was asymptomatic. We discussed her allergies toward weaning off drug. For her hemorrhoids she has used fiber and Analpram. Finally, for GERD she takes Nexium daily. She was able to wean off Entocort successfully. She has been on no therapy for lymphocytic colitis for about 4 months. She describes one formed bowel movement daily. She is pleased. 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 In terms of GERD, she has no symptoms on Nexium. Her last colonoscopy was in March of 2012. She does have a history of adenomatous polyps.  REVIEW OF SYSTEMS:  All non-GI ROS negative except for sinus an allergy trouble, arthritis, back pain, visual change, cough, depression, headaches, itching, muscle pain, muscle cramps, night sweats, nosebleeds, shortness of breath, insomnia  Past Medical History  Diagnosis Date  . Benign neoplasm of colon   . Depressive disorder, not elsewhere classified   . Anxiety state, unspecified   . Other and unspecified hyperlipidemia   . Cardiac dysrhythmia, unspecified   . Complications affecting other specified body systems, hypertension   . Esophageal reflux   . Obesity   . Hypothyroidism   . Hemorrhoids   . Colon polyps     hyperplastic  . Family history of colon cancer mother  . Lymphocytic colitis     Past Surgical History  Procedure Date  . Craniotomy   . Back surgery   . Abdominal hysterectomy   . Appendectomy   . Carpal tunnel release     right    Social History YAHAIRA BRUSKI  reports that she quit smoking about 14 months ago. She has never used smokeless  tobacco. She reports that she drinks alcohol. She reports that she does not use illicit drugs.  family history includes Bone cancer in her maternal grandmother; Breast cancer in her mother; Colon cancer in her mother; Heart attack in her father; and Lung cancer in her maternal uncle.  Allergies  Allergen Reactions  . Sulfonamide Derivatives        PHYSICAL EXAMINATION: Vital signs: BP 114/74  Pulse 88  Ht 5\' 5"  (1.651 m)  Wt 198 lb 8 oz (90.039 kg)  BMI 33.03 kg/m2 General: Well-developed, well-nourished, no acute distress HEENT: Sclerae are anicteric, conjunctiva pink. Oral mucosa intact Lungs: Clear Heart: Regular Abdomen: soft, nontender, nondistended, no obvious ascites, no peritoneal signs, normal bowel sounds. No organomegaly. Extremities: No edema Psychiatric: alert and oriented x3. Cooperative    ASSESSMENT:  #1. Lymphocytic colitis. Successfully weaned off Entocort. Asymptomatic #2. GERD. Asymptomatic on PPI #3. Hemorrhoids. Still with intermittent problems #4. History of adenomatous polyps. Last colonoscopy March of 2012 #5. General medical problems   PLAN:  #1. Expectant management with regards to lymphocytic colitis. She was advised that she could have a relapse. If she believes that she is having a relapse, she has been instructed to contact the office as we may wish to resume Entocort therapy #2. Continue reflux precautions and PPI for GERD #3.daily fiber, Analpram, and sitz baths for hemorrhoids #4. Surveillance colonoscopy around March of 2017 #5. Routine office followup in 1 year

## 2012-04-25 ENCOUNTER — Other Ambulatory Visit: Payer: Self-pay | Admitting: Internal Medicine

## 2012-04-26 ENCOUNTER — Telehealth: Payer: Self-pay

## 2012-04-26 NOTE — Telephone Encounter (Signed)
Pt had called saying her prescription for Lomotil was not at [facility] - It was faxed yesterday so I called the pharmacy, verified the fax number and told them to watch for it.  I then called the pt and told her I had refaxed it and to call me back if she had any more problems

## 2012-05-24 ENCOUNTER — Other Ambulatory Visit: Payer: Self-pay | Admitting: Internal Medicine

## 2012-06-07 ENCOUNTER — Telehealth: Payer: Self-pay | Admitting: Internal Medicine

## 2012-06-07 MED ORDER — BUDESONIDE 9 MG PO TB24: 9.0000 mg | ORAL_TABLET | Freq: Every day | ORAL | Status: DC

## 2012-06-07 NOTE — Telephone Encounter (Signed)
Resume entocort 9 mg daily. Follow up in 3 weeks

## 2012-06-07 NOTE — Telephone Encounter (Signed)
Pt states she has been having loose stools for about a month. States that every time she goes to the bathroom to urinate she has a loose stool. Pt is afraid that her colitis may be flaring up again. Per last OV note may consider resuming the entocort if symptoms return. Dr. Marina Goodell please advise.

## 2012-06-07 NOTE — Telephone Encounter (Signed)
Pt aware and rx sent to the pharmacy. Pt scheduled for OV with Dr. Marina Goodell 06/28/12@10 :45am.

## 2012-06-28 ENCOUNTER — Encounter: Payer: Self-pay | Admitting: Internal Medicine

## 2012-06-28 ENCOUNTER — Ambulatory Visit (INDEPENDENT_AMBULATORY_CARE_PROVIDER_SITE_OTHER): Payer: BC Managed Care – PPO | Admitting: Internal Medicine

## 2012-06-28 VITALS — BP 132/80 | HR 80 | Ht 63.5 in | Wt 196.1 lb

## 2012-06-28 DIAGNOSIS — Z860100 Personal history of colon polyps, unspecified: Secondary | ICD-10-CM

## 2012-06-28 DIAGNOSIS — K5289 Other specified noninfective gastroenteritis and colitis: Secondary | ICD-10-CM

## 2012-06-28 DIAGNOSIS — R197 Diarrhea, unspecified: Secondary | ICD-10-CM

## 2012-06-28 DIAGNOSIS — K219 Gastro-esophageal reflux disease without esophagitis: Secondary | ICD-10-CM

## 2012-06-28 DIAGNOSIS — K52831 Collagenous colitis: Secondary | ICD-10-CM

## 2012-06-28 MED ORDER — BUDESONIDE 9 MG PO TB24: 3.0000 mg | ORAL_TABLET | Freq: Three times a day (TID) | ORAL | Status: DC

## 2012-06-28 NOTE — Progress Notes (Signed)
HISTORY OF PRESENT ILLNESS:  [name] is a 66 y.o. female with the below listed medical history who is followed in this office for GERD,adenomatous colon polyps, hemorrhoids, and lymphocytic colitis. She was last seen in the office 11/05/2011 at which time she was asymptomatic off Entocort. She contacted the office 06/07/2012 reporting a one-month history of loose stools reminiscent of her problems with lymphocytic colitis. She was placed on Entocort 9 mg daily and presents today for followup. She reports improvement in symptoms. She is moving her bowels once or twice daily with semi-formed stool. Previously, 4-5 loose stools daily. She is having some inflammation and bleeding from her hemorrhoids. Last colonoscopy March 2012. Reflux symptoms are well controlled on PPI. No additional GI complaints such as abdominal pain or fever.  REVIEW OF SYSTEMS:  All non-GI ROS negative except for hearing problems, back pain, shortness of breath, insomnia, foot problems with anticipated surgery on the left foot  Past Medical History  Diagnosis Date  . Benign neoplasm of colon   . Depressive disorder, not elsewhere classified   . Anxiety state, unspecified   . Other and unspecified hyperlipidemia   . Cardiac dysrhythmia, unspecified   . Complications affecting other specified body systems, hypertension   . Esophageal reflux   . Obesity   . Hypothyroidism   . Hemorrhoids   . Colon polyps     hyperplastic  . Family history of colon cancer mother  . Lymphocytic colitis     Past Surgical History  Procedure Date  . Craniotomy   . Back surgery   . Abdominal hysterectomy   . Appendectomy   . Carpal tunnel release     right    Social History [name]  reports that she quit smoking about 21 months ago. She has never used smokeless tobacco. She reports that she drinks alcohol. She reports that she does not use illicit drugs.  family history includes Bone cancer in her maternal grandmother;  Breast cancer in her mother; Colon cancer in her mother; Heart attack in her father; and Lung cancer in her maternal uncle.  Allergies  Allergen Reactions  . Sulfonamide Derivatives        PHYSICAL EXAMINATION:  Vital signs: BP 132/80  Pulse 80  Ht 5' 3.5" (1.613 m)  Wt 196 lb 2 oz (88.962 kg)  BMI 34.20 kg/m2 General: Well-developed, well-nourished, no acute distress HEENT: Sclerae are anicteric, conjunctiva pink. Oral mucosa intact Lungs: Clear Heart: Regular Abdomen: soft,obese, nontender, nondistended, no obvious ascites, no peritoneal signs, normal bowel sounds. No organomegaly. Extremities: No edema Psychiatric: alert and oriented x3. Cooperative     ASSESSMENT:  #1. Lymphocytic colitis. Flare off Entocort. Improve with resumption of Entocort #2. Symptomatic hemorrhoids #3. GERD. Asymptomatic on Nexium #4. History of adenomatous colon polyps. Family history of colon cancer in her mother age 64's. No neoplasia on most recent exam  PLAN:  #1. Continue Entocort 9 mg daily. Medication refilled. #2. Antidiarrheals as needed #3. Hemorrhoid care information and medicated suppositories #4. Routine office followup in 6 weeks. Contact the office in the interim if needed. #5. Surveillance colonoscopy around March 2017

## 2012-06-28 NOTE — Patient Instructions (Addendum)
We have sent the following medications to your pharmacy for you to pick up at your convenience:  Entocort

## 2012-07-06 ENCOUNTER — Encounter (HOSPITAL_BASED_OUTPATIENT_CLINIC_OR_DEPARTMENT_OTHER): Payer: Self-pay | Admitting: *Deleted

## 2012-07-06 NOTE — Progress Notes (Signed)
Pt will need istat-called her pcp for ekg-notes and sleep study-did not need cpap

## 2012-07-07 ENCOUNTER — Ambulatory Visit (HOSPITAL_BASED_OUTPATIENT_CLINIC_OR_DEPARTMENT_OTHER)
Admission: RE | Admit: 2012-07-07 | Discharge: 2012-07-07 | Disposition: A | Discharge status: Home / Self Care | Payer: BC Managed Care – PPO | Source: Ambulatory Visit | Attending: Orthopedic Surgery | Admitting: Orthopedic Surgery

## 2012-07-07 ENCOUNTER — Encounter (HOSPITAL_BASED_OUTPATIENT_CLINIC_OR_DEPARTMENT_OTHER): Payer: Self-pay | Admitting: Anesthesiology

## 2012-07-07 ENCOUNTER — Encounter (HOSPITAL_BASED_OUTPATIENT_CLINIC_OR_DEPARTMENT_OTHER): Payer: Self-pay

## 2012-07-07 ENCOUNTER — Ambulatory Visit (HOSPITAL_BASED_OUTPATIENT_CLINIC_OR_DEPARTMENT_OTHER): Payer: BC Managed Care – PPO | Admitting: Anesthesiology

## 2012-07-07 ENCOUNTER — Encounter (HOSPITAL_BASED_OUTPATIENT_CLINIC_OR_DEPARTMENT_OTHER)
Admission: RE | Disposition: A | Discharge status: Home / Self Care | Payer: Self-pay | Source: Ambulatory Visit | Attending: Orthopedic Surgery

## 2012-07-07 DIAGNOSIS — Z90710 Acquired absence of both cervix and uterus: Secondary | ICD-10-CM | POA: Insufficient documentation

## 2012-07-07 DIAGNOSIS — E669 Obesity, unspecified: Secondary | ICD-10-CM | POA: Insufficient documentation

## 2012-07-07 DIAGNOSIS — E039 Hypothyroidism, unspecified: Secondary | ICD-10-CM | POA: Insufficient documentation

## 2012-07-07 DIAGNOSIS — M2040 Other hammer toe(s) (acquired), unspecified foot: Secondary | ICD-10-CM | POA: Insufficient documentation

## 2012-07-07 DIAGNOSIS — K219 Gastro-esophageal reflux disease without esophagitis: Secondary | ICD-10-CM | POA: Insufficient documentation

## 2012-07-07 DIAGNOSIS — F411 Generalized anxiety disorder: Secondary | ICD-10-CM | POA: Insufficient documentation

## 2012-07-07 DIAGNOSIS — Z803 Family history of malignant neoplasm of breast: Secondary | ICD-10-CM | POA: Insufficient documentation

## 2012-07-07 DIAGNOSIS — Z860100 Personal history of colon polyps, unspecified: Secondary | ICD-10-CM | POA: Insufficient documentation

## 2012-07-07 DIAGNOSIS — I10 Essential (primary) hypertension: Secondary | ICD-10-CM | POA: Insufficient documentation

## 2012-07-07 DIAGNOSIS — Z6833 Body mass index (BMI) 33.0-33.9, adult: Secondary | ICD-10-CM | POA: Insufficient documentation

## 2012-07-07 DIAGNOSIS — F3289 Other specified depressive episodes: Secondary | ICD-10-CM | POA: Insufficient documentation

## 2012-07-07 DIAGNOSIS — Z9089 Acquired absence of other organs: Secondary | ICD-10-CM | POA: Insufficient documentation

## 2012-07-07 DIAGNOSIS — F329 Major depressive disorder, single episode, unspecified: Secondary | ICD-10-CM | POA: Insufficient documentation

## 2012-07-07 DIAGNOSIS — Z800 Family history of malignant neoplasm of digestive organs: Secondary | ICD-10-CM | POA: Insufficient documentation

## 2012-07-07 HISTORY — DX: Encounter for fitting and adjustment of spectacles and contact lenses: Z46.0

## 2012-07-07 HISTORY — PX: HAMMERTOE RECONSTRUCTION WITH WEIL OSTEOTOMY: SHX5631

## 2012-07-07 LAB — POCT I-STAT, CHEM 8
BUN: 24 mg/dL — ABNORMAL HIGH (ref 6–23)
Calcium, Ion: 1.29 mmol/L (ref 1.13–1.30)
Chloride: 106 mEq/L (ref 96–112)
Creatinine, Ser: 0.7 mg/dL (ref 0.50–1.10)
Glucose, Bld: 119 mg/dL — ABNORMAL HIGH (ref 70–99)

## 2012-07-07 SURGERY — HAMMERTOE RECONSTRUCTION WITH WEIL OSTEOTOMY
Anesthesia: General | Site: Foot | Laterality: Left | Wound class: Clean

## 2012-07-07 MED ORDER — BUPIVACAINE HCL (PF) 0.5 % IJ SOLN
INTRAMUSCULAR | Status: DC | PRN
  Administered 2012-07-07: 20 mL

## 2012-07-07 MED ORDER — ONDANSETRON HCL 4 MG/2ML IJ SOLN
INTRAMUSCULAR | Status: DC | PRN
  Administered 2012-07-07: 4 mg via INTRAVENOUS

## 2012-07-07 MED ORDER — DEXAMETHASONE SODIUM PHOSPHATE 4 MG/ML IJ SOLN
INTRAMUSCULAR | Status: DC | PRN
  Administered 2012-07-07: 4 mg

## 2012-07-07 MED ORDER — BUPIVACAINE-EPINEPHRINE PF 0.5-1:200000 % IJ SOLN
INTRAMUSCULAR | Status: DC | PRN
  Administered 2012-07-07: 20 mL

## 2012-07-07 MED ORDER — BACITRACIN ZINC 500 UNIT/GM EX OINT
TOPICAL_OINTMENT | CUTANEOUS | Status: DC | PRN
  Administered 2012-07-07: 1 via TOPICAL

## 2012-07-07 MED ORDER — OXYCODONE HCL 5 MG/5ML PO SOLN: 5.0000 mg | Freq: Once | ORAL | Status: DC | PRN

## 2012-07-07 MED ORDER — DEXAMETHASONE SODIUM PHOSPHATE 4 MG/ML IJ SOLN
INTRAMUSCULAR | Status: DC | PRN
  Administered 2012-07-07: 10 mg via INTRAVENOUS

## 2012-07-07 MED ORDER — PROPOFOL 10 MG/ML IV BOLUS
INTRAVENOUS | Status: DC | PRN
  Administered 2012-07-07: 30 mg via INTRAVENOUS
  Administered 2012-07-07: 160 mg via INTRAVENOUS

## 2012-07-07 MED ORDER — LIDOCAINE HCL (CARDIAC) 20 MG/ML IV SOLN
INTRAVENOUS | Status: DC | PRN
  Administered 2012-07-07: 50 mg via INTRAVENOUS

## 2012-07-07 MED ORDER — OXYCODONE HCL 5 MG PO TABS: 5.0000 mg | ORAL_TABLET | ORAL | Status: DC | PRN

## 2012-07-07 MED ORDER — 0.9 % SODIUM CHLORIDE (POUR BTL) OPTIME
TOPICAL | Status: DC | PRN
  Administered 2012-07-07: 200 mL

## 2012-07-07 MED ORDER — LACTATED RINGERS IV SOLN
INTRAVENOUS | Status: DC
  Administered 2012-07-07 (×2): via INTRAVENOUS

## 2012-07-07 MED ORDER — MIDAZOLAM HCL 2 MG/2ML IJ SOLN
1.0000 mg | INTRAMUSCULAR | Status: DC | PRN
  Administered 2012-07-07: 2 mg via INTRAVENOUS

## 2012-07-07 MED ORDER — PROMETHAZINE HCL 25 MG/ML IJ SOLN: 6.2500 mg | INTRAMUSCULAR | Status: DC | PRN

## 2012-07-07 MED ORDER — OXYCODONE HCL 5 MG PO TABS: 5.0000 mg | ORAL_TABLET | Freq: Once | ORAL | Status: DC | PRN

## 2012-07-07 MED ORDER — FENTANYL CITRATE 0.05 MG/ML IJ SOLN
50.0000 ug | Freq: Once | INTRAMUSCULAR | Status: AC
  Administered 2012-07-07: 100 ug via INTRAVENOUS

## 2012-07-07 MED ORDER — HYDROMORPHONE HCL PF 1 MG/ML IJ SOLN: 0.2500 mg | INTRAMUSCULAR | Status: DC | PRN

## 2012-07-07 MED ORDER — CEFAZOLIN SODIUM-DEXTROSE 2-3 GM-% IV SOLR
INTRAVENOUS | Status: DC | PRN
  Administered 2012-07-07: 2 g via INTRAVENOUS

## 2012-07-07 MED ORDER — FENTANYL CITRATE 0.05 MG/ML IJ SOLN
INTRAMUSCULAR | Status: DC | PRN
  Administered 2012-07-07: 25 ug via INTRAVENOUS

## 2012-07-07 SURGICAL SUPPLY — 79 items
BANDAGE CONFORM 2  STR LF (GAUZE/BANDAGES/DRESSINGS) ×1 IMPLANT
BANDAGE CONFORM 3  STR LF (GAUZE/BANDAGES/DRESSINGS) ×4 IMPLANT
BANDAGE ESMARK 6X9 LF (GAUZE/BANDAGES/DRESSINGS) ×1 IMPLANT
BIT DRILL 2 (BIT) ×2 IMPLANT
BLADE AVERAGE 25X9 (BLADE) ×3 IMPLANT
BLADE SURG 15 STRL LF DISP TIS (BLADE) ×4 IMPLANT
BLADE SURG 15 STRL SS (BLADE) ×6
BNDG CMPR 9X6 STRL LF SNTH (GAUZE/BANDAGES/DRESSINGS) ×2
BNDG COHESIVE 4X5 TAN STRL (GAUZE/BANDAGES/DRESSINGS) ×3 IMPLANT
BNDG COHESIVE 6X5 TAN STRL LF (GAUZE/BANDAGES/DRESSINGS) ×3 IMPLANT
BNDG ESMARK 6X9 LF (GAUZE/BANDAGES/DRESSINGS) ×3
CHLORAPREP W/TINT 26ML (MISCELLANEOUS) ×3 IMPLANT
CLOTH BEACON ORANGE TIMEOUT ST (SAFETY) ×3 IMPLANT
COVER TABLE BACK 60X90 (DRAPES) ×3 IMPLANT
CUFF TOURNIQUET SINGLE 18IN (TOURNIQUET CUFF) IMPLANT
CUFF TOURNIQUET SINGLE 34IN LL (TOURNIQUET CUFF) ×2 IMPLANT
DRAPE C-ARM 42X72 X-RAY (DRAPES) ×1 IMPLANT
DRAPE EXTREMITY T 121X128X90 (DRAPE) ×3 IMPLANT
DRAPE INCISE IOBAN 66X45 STRL (DRAPES) ×1 IMPLANT
DRAPE OEC MINIVIEW 54X84 (DRAPES) ×4 IMPLANT
DRAPE U-SHAPE 47X51 STRL (DRAPES) ×3 IMPLANT
DRAPE U-SHAPE 76X120 STRL (DRAPES) ×1 IMPLANT
DRSG EMULSION OIL 3X3 NADH (GAUZE/BANDAGES/DRESSINGS) ×3 IMPLANT
DRSG PAD ABDOMINAL 8X10 ST (GAUZE/BANDAGES/DRESSINGS) ×3 IMPLANT
ELECT REM PT RETURN 9FT ADLT (ELECTROSURGICAL) ×3
ELECTRODE REM PT RTRN 9FT ADLT (ELECTROSURGICAL) ×2 IMPLANT
GAUZE SPONGE 4X4 16PLY XRAY LF (GAUZE/BANDAGES/DRESSINGS) IMPLANT
GLOVE BIO SURGEON STRL SZ 6.5 (GLOVE) ×2 IMPLANT
GLOVE BIO SURGEON STRL SZ8 (GLOVE) ×3 IMPLANT
GLOVE BIOGEL M STRL SZ7.5 (GLOVE) ×2 IMPLANT
GLOVE BIOGEL PI IND STRL 8 (GLOVE) ×3 IMPLANT
GLOVE BIOGEL PI INDICATOR 8 (GLOVE) ×2
GLOVE ECLIPSE 6.5 STRL STRAW (GLOVE) ×2 IMPLANT
GLOVE INDICATOR 6.5 STRL GRN (GLOVE) ×2 IMPLANT
GOWN BRE IMP PREV XXLGXLNG (GOWN DISPOSABLE) ×2 IMPLANT
GOWN PREVENTION PLUS XLARGE (GOWN DISPOSABLE) ×3 IMPLANT
GOWN PREVENTION PLUS XXLARGE (GOWN DISPOSABLE) ×3 IMPLANT
IMPLANT SMART TOE 19MM (Toe) IMPLANT
IMPLANT SMART TOE W/POST H 19M (Toe) ×2 IMPLANT
IMPLANT SMARTTOE 16MM STRAIGHT (Toe) ×6 IMPLANT
NEEDLE HYPO 22GX1.5 SAFETY (NEEDLE) IMPLANT
NS IRRIG 1000ML POUR BTL (IV SOLUTION) ×5 IMPLANT
PACK BASIN DAY SURGERY FS (CUSTOM PROCEDURE TRAY) ×3 IMPLANT
PAD CAST 4YDX4 CTTN HI CHSV (CAST SUPPLIES) ×3 IMPLANT
PADDING CAST ABS 4INX4YD NS (CAST SUPPLIES) ×1
PADDING CAST ABS COTTON 4X4 ST (CAST SUPPLIES) ×2 IMPLANT
PADDING CAST COTTON 4X4 STRL (CAST SUPPLIES) ×6
PADDING CAST COTTON 6X4 STRL (CAST SUPPLIES) ×3 IMPLANT
PASSER SUT SWANSON 36MM LOOP (INSTRUMENTS) IMPLANT
PENCIL BUTTON HOLSTER BLD 10FT (ELECTRODE) ×2 IMPLANT
SCREW SNAP OFF 2.0X12 (Screw) ×4 IMPLANT
SCREW SNAP OFF 2.0X14 (Screw) ×2 IMPLANT
SHEET MEDIUM DRAPE 40X70 STRL (DRAPES) ×4 IMPLANT
SLEEVE SCD COMPRESS KNEE MED (MISCELLANEOUS) ×2 IMPLANT
SPLINT FAST PLASTER 5X30 (CAST SUPPLIES)
SPLINT PLASTER CAST FAST 5X30 (CAST SUPPLIES) ×20 IMPLANT
SPONGE GAUZE 4X4 12PLY (GAUZE/BANDAGES/DRESSINGS) ×5 IMPLANT
SPONGE LAP 18X18 X RAY DECT (DISPOSABLE) ×3 IMPLANT
STOCKINETTE 6  STRL (DRAPES) ×1
STOCKINETTE 6 STRL (DRAPES) ×2 IMPLANT
SUCTION FRAZIER TIP 10 FR DISP (SUCTIONS) IMPLANT
SUT ETHILON 3 0 FSL (SUTURE) IMPLANT
SUT ETHILON 3 0 PS 1 (SUTURE) ×7 IMPLANT
SUT MERSILENE 2.0 SH NDLE (SUTURE) IMPLANT
SUT MNCRL AB 3-0 PS2 18 (SUTURE) ×3 IMPLANT
SUT MNCRL AB 4-0 PS2 18 (SUTURE) IMPLANT
SUT VIC AB 0 SH 27 (SUTURE) ×3 IMPLANT
SUT VIC AB 2-0 CT1 27 (SUTURE)
SUT VIC AB 2-0 CT1 TAPERPNT 27 (SUTURE) IMPLANT
SUT VIC AB 2-0 SH 18 (SUTURE) IMPLANT
SUT VIC AB 3-0 PS1 18 (SUTURE)
SUT VIC AB 3-0 PS1 18XBRD (SUTURE) IMPLANT
SYR BULB 3OZ (MISCELLANEOUS) ×3 IMPLANT
SYR CONTROL 10ML LL (SYRINGE) IMPLANT
TOWEL OR 17X24 6PK STRL BLUE (TOWEL DISPOSABLE) ×5 IMPLANT
TUBE CONNECTING 20X1/4 (TUBING) IMPLANT
UNDERPAD 30X30 INCONTINENT (UNDERPADS AND DIAPERS) ×3 IMPLANT
WATER STERILE IRR 1000ML POUR (IV SOLUTION) ×1 IMPLANT
YANKAUER SUCT BULB TIP NO VENT (SUCTIONS) IMPLANT

## 2012-07-07 NOTE — Addendum Note (Signed)
Addendum  created 07/07/12 1127 by Gar Gibbon, CRNA   Modules edited:Charges VN

## 2012-07-07 NOTE — H&P (Signed)
[name] is an 66 y.o. female.   Chief Complaint: left forefoot pain HPI: 66 y/o woman with painful left forefoot due to hammertoes and metatarsal overload.  She presents now for operative treatment.  Past Medical History  Diagnosis Date  . Benign neoplasm of colon   . Depressive disorder, not elsewhere classified   . Anxiety state, unspecified   . Other and unspecified hyperlipidemia   . Cardiac dysrhythmia, unspecified   . Complications affecting other specified body systems, hypertension   . Esophageal reflux   . Obesity   . Hypothyroidism   . Hemorrhoids   . Colon polyps     hyperplastic  . Family history of colon cancer mother  . Lymphocytic colitis   . Contact lens/glasses fitting     wears glassses or contacts    Past Surgical History  Procedure Date  . Craniotomy   . Back surgery   . Abdominal hysterectomy   . Appendectomy   . Carpal tunnel release     right  . Brain surgery 1999    benign tumor    Family History  Problem Relation Age of Onset  . Breast cancer Mother   . Colon cancer Mother   . Lung cancer Maternal Uncle   . Bone cancer Maternal Grandmother   . Heart attack Father    Social History:  reports that she quit smoking about 22 months ago. She has never used smokeless tobacco. She reports that she drinks alcohol. She reports that she does not use illicit drugs.  Allergies:  Allergies  Allergen Reactions  . Codeine Other (See Comments)    nightmares  . Sulfonamide Derivatives     Medications Prior to Admission  Medication Sig Dispense Refill  . Ascorbic Acid (VITAMIN C) 1000 MG tablet Take 1,000 mg by mouth daily.        Marland Kitchen aspirin 81 MG tablet Take 81 mg by mouth 2 (two) times daily.       Marland Kitchen atorvastatin (LIPITOR) 40 MG tablet Take 1 tablets by mouth once daily.      . Budesonide 9 MG TB24 Take 3 mg by mouth 3 (three) times daily.      Marland Kitchen buPROPion (WELLBUTRIN SR) 150 MG 12 hr tablet Take 150 mg by mouth daily.       . Calcium  Carbonate-Vitamin D 600-400 MG-UNIT per tablet Take 1 tablet by mouth 2 (two) times daily.        . cetirizine (ZYRTEC) 10 MG tablet Take 10 mg by mouth daily.        . cyclobenzaprine (FLEXERIL) 10 MG tablet Take 10 mg by mouth every 8 (eight) hours.      . diphenoxylate-atropine (LOMOTIL) 2.5-0.025 MG per tablet TAKE 1 TO 2 TABLETS BY MOUTH DAILY AS NEEDED FOR DIARRHEA/LOOSE STOOLS  60 tablet  1  . esomeprazole (NEXIUM) 40 MG capsule Take 40 mg by mouth daily before breakfast.        . hydrochlorothiazide 25 MG tablet Take 25 mg by mouth daily.       . hydrocortisone (ANUSOL-HC) 25 MG suppository PLACE 1 SUPPOSITORY (25 MG TOTAL) RECTALLY AT BEDTIME AS NEEDED.  100 suppository  0  . levothyroxine (SYNTHROID, LEVOTHROID) 88 MCG tablet Take 88 mcg by mouth daily.      . Melatonin 5 MG CAPS Take 1 capsule by mouth daily.       . Multiple Vitamin (MULTIVITAMINS PO) Take 1 tablet by mouth daily.        Marland Kitchen  nabumetone (RELAFEN) 500 MG tablet Take 500 mg by mouth 2 (two) times daily.       . Omega-3 Fatty Acids (FISH OIL) 1000 MG CAPS Take 1 capsule by mouth daily.       . sertraline (ZOLOFT) 25 MG tablet Take 25 mg by mouth daily.      . traMADol (ULTRAM) 50 MG tablet Take 50 mg by mouth every 8 (eight) hours as needed.        Results for orders placed during the hospital encounter of 2012/07/14 (from the past 48 hour(s))  POCT I-STAT, CHEM 8     Status: Abnormal   Collection Time   07-14-2012  6:53 AM      Component Value Range Comment   Sodium 140  135 - 145 mEq/L    Potassium 3.9  3.5 - 5.1 mEq/L    Chloride 106  96 - 112 mEq/L    BUN 24 (*) 6 - 23 mg/dL    Creatinine, Ser 4.09  0.50 - 1.10 mg/dL    Glucose, Bld 811 (*) 70 - 99 mg/dL    Calcium, Ion 9.14  7.82 - 1.30 mmol/L    TCO2 25  0 - 100 mmol/L    Hemoglobin 13.3  12.0 - 15.0 g/dL    HCT 95.6  21.3 - 08.6 %    No results found.  ROS  No recent f/c/v/n/wt loss  Blood pressure 116/78, pulse 71, temperature 98.2 F (36.8 C),  temperature source Oral, resp. rate 18, height 5' 3.5" (1.613 m), weight 88.111 kg (194 lb 4 oz), SpO2 97.00%. Physical Exam wn wd female in nad.  A and O x 4.  Mood and affect normal.  EOMI.  Respirations unlabored.  L forefoot with hammertoes.  Skin healthy and intact.  Pulses palpable.  Feels LT normally.  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  The risks and benefits of the alternative treatment options have been discussed in detail.  The patient wishes to proceed with surgery and specifically understands risks of bleeding, infection, nerve damage, blood clots, need for additional surgery, amputation and death.   [name] Jul 14, 2012, 7:23 AM

## 2012-07-07 NOTE — Anesthesia Preprocedure Evaluation (Signed)
Anesthesia Evaluation  Patient identified by MRN, date of birth, ID band Patient awake    Reviewed: Allergy & Precautions, H&P , NPO status , Patient's Chart, lab work & pertinent test results  Airway Mallampati: II TM Distance: >3 FB Neck ROM: Full    Dental   Pulmonary  breath sounds clear to auscultation        Cardiovascular hypertension, + Peripheral Vascular Disease Rhythm:Regular Rate:Normal     Neuro/Psych Anxiety Depression    GI/Hepatic GERD-  ,  Endo/Other  Hypothyroidism   Renal/GU      Musculoskeletal   Abdominal (+) + obese,   Peds  Hematology   Anesthesia Other Findings   Reproductive/Obstetrics                           Anesthesia Physical Anesthesia Plan  ASA: II  Anesthesia Plan: General   Post-op Pain Management:    Induction: Intravenous  Airway Management Planned: LMA  Additional Equipment:   Intra-op Plan:   Post-operative Plan: Extubation in OR  Informed Consent: I have reviewed the patients History and Physical, chart, labs and discussed the procedure including the risks, benefits and alternatives for the proposed anesthesia with the patient or authorized representative who has indicated his/her understanding and acceptance.     Plan Discussed with: CRNA and Surgeon  Anesthesia Plan Comments:         Anesthesia Quick Evaluation

## 2012-07-07 NOTE — Anesthesia Postprocedure Evaluation (Signed)
  Anesthesia Post-op Note  Patient: [name]  Procedure(s) Performed: Procedure(s) (LRB) with comments: HAMMERTOE RECONSTRUCTION WITH WEIL OSTEOTOMY (Left) - Left Second through Fourth Hammertoe Correction; Left Fifth Toe Flexor Tendon Release, and Left Second through Fourth Weil Osteotomy  Patient Location: PACU  Anesthesia Type:GA combined with regional for post-op pain  Level of Consciousness: awake and alert   Airway and Oxygen Therapy: Patient Spontanous Breathing  Post-op Pain: none  Post-op Assessment: Post-op Vital signs reviewed, Patient's Cardiovascular Status Stable, Respiratory Function Stable, Patent Airway, No signs of Nausea or vomiting, Adequate PO intake and Pain level controlled  Post-op Vital Signs: stable  Complications: No apparent anesthesia complications

## 2012-07-07 NOTE — Transfer of Care (Signed)
Immediate Anesthesia Transfer of Care Note  Patient: [name]  Procedure(s) Performed: Procedure(s) (LRB) with comments: HAMMERTOE RECONSTRUCTION WITH WEIL OSTEOTOMY (Left) - Left Second through Fourth Hammertoe Correction; Left Fifth Toe Flexor Tendon Release, and Left Second through Fourth Weil Osteotomy  Patient Location: PACU  Anesthesia Type:GA combined with regional for post-op pain  Level of Consciousness: awake and patient cooperative  Airway & Oxygen Therapy: Patient Spontanous Breathing and Patient connected to face mask oxygen  Post-op Assessment: Report given to PACU RN and Post -op Vital signs reviewed and stable  Post vital signs: Reviewed and stable  Complications: No apparent anesthesia complications

## 2012-07-07 NOTE — Brief Op Note (Signed)
07/07/2012  9:35 AM  PATIENT:  [name]  66 y.o. female  PRE-OPERATIVE DIAGNOSIS:  Left Second through Fifth  Hammertoes  POST-OPERATIVE DIAGNOSIS:  Left Second through Fifth  Hammertoes  Procedure(s): 1.  Left 2nd - 4th MT weil osteotomies 2.  Left 2nd -4th hammertoe corrections (PIP arthrodeses) 3.  Left 2nd - 4th percutaneous FDL tenotomies 4.  Left 5th toe open FDL and FDB tenotomies 5.  Fluoro  SURGEON:  Toni Arthurs, MD  ASSISTANT: n/a  ANESTHESIA:   General, regional  EBL:  minimal   TOURNIQUET:   Total Tourniquet Time Documented: Thigh (Left) - 87 minutes  COMPLICATIONS:  None apparent  DISPOSITION:  Extubated, awake and stable to recovery.  DICTATION ID:  [id number]

## 2012-07-07 NOTE — Progress Notes (Signed)
Assisted Dr. Gypsy Balsam with left, popliteal/saphenous block. Side rails up, monitors on throughout procedure. See vital signs in flow sheet. Tolerated Procedure well.

## 2012-07-07 NOTE — Anesthesia Procedure Notes (Addendum)
Anesthesia Regional Block:  Popliteal block  Pre-Anesthetic Checklist: ,, timeout performed, Correct Patient, Correct Site, Correct Laterality, Correct Procedure, Correct Position, site marked, Risks and benefits discussed,  Surgical consent,  Pre-op evaluation,  At surgeon's request and post-op pain management  Laterality: Left  Prep: chloraprep       Needles:  Injection technique: Single-shot  Needle Type: Echogenic Stimulator Needle      Needle Gauge: 22 and 22 G    Additional Needles:  Procedures: nerve stimulator Popliteal block  Nerve Stimulator or Paresthesia:  Response: 0.5 mA,   Additional Responses:   Narrative:  Start time: 07/07/2012 7:14 AM End time: 07/07/2012 7:25 AM Injection made incrementally with aspirations every 5 mL. Anesthesiologist: Dr Gypsy Balsam  Additional Notes: 4540-9811 L Popliteal N Block POP CHG prep, sterile tech #22 echo/stim needle w/stim down to .5ma Multiple neg asp Marc .5% 20cc+Marc .5% w/epi 1:200000 20cc+ decadron 4mg  No compl Dr Gypsy Balsam  Popliteal block Procedure Name: LMA Insertion Date/Time: 07/07/2012 7:43 AM Performed by: Gar Gibbon Pre-anesthesia Checklist: Patient identified, Emergency Drugs available, Suction available and Patient being monitored Patient Re-evaluated:Patient Re-evaluated prior to inductionOxygen Delivery Method: Circle System Utilized Preoxygenation: Pre-oxygenation with 100% oxygen Intubation Type: IV induction Ventilation: Mask ventilation without difficulty LMA: LMA with gastric port inserted LMA Size: 4.0 Number of attempts: 1 Placement Confirmation: positive ETCO2 Tube secured with: Tape Dental Injury: Teeth and Oropharynx as per pre-operative assessment

## 2012-07-07 NOTE — Op Note (Signed)
[name], ROES NO.:  [id number]  MEDICAL RECORD NO.:  [id number]  LOCATION:                                 FACILITY:  PHYSICIAN:  Toni Arthurs, MD             DATE OF BIRTH:  DATE OF PROCEDURE:  07/07/2012 DATE OF DISCHARGE:                              OPERATIVE REPORT   PREOPERATIVE DIAGNOSIS:  Left 2nd through 5th hammertoes.  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  PROCEDURE: 1. Left 2nd, 3rd, and 4th metatarsal Weil osteotomies. 2. Left 2nd, 3rd, and 4th hammertoe corrections (PIP arthrodesis). 3. Left 2nd, 3rd, and 4th toe percutaneous flexor digitorum longus     tenotomies. 4. Left 5th toe open flexor digitorum longus and flexor digitorum     brevis tenotomies. 5. Intraoperative interpretation of fluoroscopic imaging.  SURGEON:  Toni Arthurs, MD  ANESTHESIA:  General, regional.  ESTIMATED BLOOD LOSS:  Minimal.  TOURNIQUET TIME:  87 minutes at 225 mmHg.  COMPLICATIONS:  None apparent.  DISPOSITION:  Extubated awake and stable to recovery.  INDICATIONS FOR PROCEDURE:  The patient is a 66 year old female with a painful left forefoot deformity.  She has 2nd through 5th hammertoes. These strike the floor at the tips and cause painful calluses.  She presents now for operative treatment of these conditions.  She understands the risks and benefits, alternative treatment options and elects surgical treatment.  She specifically understands risks of bleeding, infection, nerve damage, blood clots, need for additional surgery, amputation, and death.  PROCEDURE IN DETAIL:  After preoperative consent was obtained and the correct operative site was identified, the patient was brought to the operating room and placed supine on the operating table.  General anesthesia was induced.  Preoperative antibiotics were administered. Surgical time-out was taken.  The left lower extremity was prepped and draped in standard sterile fashion  with tourniquet around the thigh. The extremity was exsanguinated and tourniquet was inflated to 225 mmHg. A longitudinal incision was made over the 2nd webspace.  The 2nd metatarsal head was exposed.  A Weil osteotomy was created and a small wedge of bone was removed dorsally.  The metatarsal head was allowed to translate proximally several mm.  The osteotomy was fixed with a partially threaded cancellous screw.  This was a Stryker spin screw. The 3rd metatarsal head was similarly exposed and a Weil osteotomy was made and fixed again with a partially threaded cancellous screw.  An incision was then made over the 4th metatarsal.  The  metatarsal head was exposed.  Again a Weil osteotomy was made.  The head was translated proximally several mm and fixed with a partially threaded cancellous screw.  Attention was then turned to the second toe PIP joint.  A transverse incision was made and sharp dissection was carried down to the level of the head of the proximal phalanx.  An oscillating saw was used to resect the head of the proximal phalanx just below the subchondral bone.  A 2 mm drill bit was then inserted up the shaft of the proximal phalanx and then down the base of the middle phalanx into the shaft  of the middle phalanx.  A small reamer was then used to resect the articular cartilage from the base of the middle phalanx.  A Stryker Smart toe device was then placed in the proximal phalanx.  The middle phalanx was reduced over the tip and the joint was held in a compressed position while the device warmed.  The positioning pin was then removed.  AP and lateral fluoroscopic images confirmed appropriate reduction of the joint and appropriate position of the implant.  This procedure was then repeated for the third and fourth toes.  Prior to inserting the devices, percutaneous flexor tenotomies were carried out by making a small stab incision in the distal flexion crease and releasing the  flexor digitorum longus tendon from the distal phalanx.  The FDL percutaneous tenotomies were done for the 2nd, 3rd, and 4th toes.  An oblique incision was then made over the plantar aspect of the 5th toe.  Sharp dissection was carried down through the skin and subcutaneous tissue.  The flexor tendon sheath was identified and opened sharply.  The flexor digitorum longus and flexor digitorum brevis tendons were both incised releasing them appropriately.  This allowed passive correction of the 5th toe.  Final AP and lateral views showed appropriate position and length of all hardware and appropriate reduction of the PIP joints and the Weil metatarsal osteotomies.  Wounds were all irrigated copiously.  The dorsal longitudinal incisions were closed with inverted simple sutures of 3-0 Monocryl and a running 3-0 nylon.  The transverse incisions were all closed with horizontal mattress sutures of 3-0 nylon.  The plantar incisions were all closed with simple sutures of 3-0 nylon.  Sterile dressings were applied followed by a compression wrap.  The tourniquet was released at 87 minutes.  The patient was awakened from anesthesia and transported to recovery room in stable condition.  FOLLOWUP PLAN:  The patient will be weightbearing as tolerated on her heel in a Darco shoe.  She will follow up with me in 2 weeks for suture removal and wound check.     Toni Arthurs, MD     JH/MEDQ  D:  07/07/2012  T:  07/07/2012  Job:  [id number]

## 2012-07-11 ENCOUNTER — Encounter (HOSPITAL_BASED_OUTPATIENT_CLINIC_OR_DEPARTMENT_OTHER): Payer: Self-pay | Admitting: Orthopedic Surgery

## 2012-07-27 HISTORY — PX: BACK SURGERY: SHX140

## 2012-12-28 ENCOUNTER — Other Ambulatory Visit: Payer: Self-pay | Admitting: Neurosurgery

## 2012-12-28 DIAGNOSIS — M545 Low back pain: Secondary | ICD-10-CM

## 2013-01-24 ENCOUNTER — Ambulatory Visit
Admission: RE | Admit: 2013-01-24 | Discharge: 2013-01-24 | Disposition: A | Discharge status: Home / Self Care | Payer: BC Managed Care – PPO | Source: Ambulatory Visit | Attending: Neurosurgery | Admitting: Neurosurgery

## 2013-01-24 VITALS — BP 130/78 | HR 67

## 2013-01-24 DIAGNOSIS — M545 Low back pain: Secondary | ICD-10-CM

## 2013-01-24 MED ORDER — MEPERIDINE HCL 100 MG/ML IJ SOLN
75.0000 mg | Freq: Once | INTRAMUSCULAR | Status: AC
  Administered 2013-01-24: 75 mg via INTRAMUSCULAR

## 2013-01-24 MED ORDER — DIAZEPAM 5 MG PO TABS
5.0000 mg | ORAL_TABLET | Freq: Once | ORAL | Status: AC
  Administered 2013-01-24: 5 mg via ORAL

## 2013-01-24 MED ORDER — ONDANSETRON HCL 4 MG/2ML IJ SOLN
4.0000 mg | Freq: Once | INTRAMUSCULAR | Status: AC
  Administered 2013-01-24: 4 mg via INTRAMUSCULAR

## 2013-01-24 MED ORDER — IOHEXOL 180 MG/ML  SOLN
15.0000 mL | Freq: Once | INTRAMUSCULAR | Status: AC | PRN
  Administered 2013-01-24: 15 mL via INTRATHECAL

## 2013-01-24 NOTE — Progress Notes (Signed)
Pt states she has been off wellbutrin, tramadol and zoloft for the past 3 days. Discharge instructions explained to pt.

## 2013-03-09 ENCOUNTER — Other Ambulatory Visit: Payer: Self-pay | Admitting: Neurosurgery

## 2013-03-31 ENCOUNTER — Encounter (HOSPITAL_COMMUNITY)
Admission: RE | Admit: 2013-03-31 | Discharge: 2013-03-31 | Disposition: A | Discharge status: Home / Self Care | Payer: BC Managed Care – PPO | Source: Ambulatory Visit | Attending: Anesthesiology | Admitting: Anesthesiology

## 2013-03-31 ENCOUNTER — Encounter (HOSPITAL_COMMUNITY): Payer: Self-pay | Admitting: Pharmacy Technician

## 2013-03-31 ENCOUNTER — Encounter (HOSPITAL_COMMUNITY): Payer: Self-pay

## 2013-03-31 ENCOUNTER — Encounter (HOSPITAL_COMMUNITY)
Admission: RE | Admit: 2013-03-31 | Discharge: 2013-03-31 | Disposition: A | Discharge status: Home / Self Care | Payer: BC Managed Care – PPO | Source: Ambulatory Visit | Attending: Neurosurgery | Admitting: Neurosurgery

## 2013-03-31 DIAGNOSIS — Z01812 Encounter for preprocedural laboratory examination: Secondary | ICD-10-CM | POA: Insufficient documentation

## 2013-03-31 DIAGNOSIS — Z01810 Encounter for preprocedural cardiovascular examination: Secondary | ICD-10-CM | POA: Insufficient documentation

## 2013-03-31 DIAGNOSIS — Z01818 Encounter for other preprocedural examination: Secondary | ICD-10-CM | POA: Insufficient documentation

## 2013-03-31 HISTORY — DX: Unspecified convulsions: R56.9

## 2013-03-31 HISTORY — DX: Neoplasm of unspecified behavior of brain: D49.6

## 2013-03-31 LAB — CBC
HCT: 39.2 % (ref 36.0–46.0)
Hemoglobin: 13.3 g/dL (ref 12.0–15.0)
MCH: 32 pg (ref 26.0–34.0)
MCHC: 33.9 g/dL (ref 30.0–36.0)
MCV: 94.5 fL (ref 78.0–100.0)
Platelets: 262 10*3/uL (ref 150–400)
RBC: 4.15 MIL/uL (ref 3.87–5.11)
RDW: 13.2 % (ref 11.5–15.5)
WBC: 9 10*3/uL (ref 4.0–10.5)

## 2013-03-31 LAB — COMPREHENSIVE METABOLIC PANEL
ALT: 28 U/L (ref 0–35)
AST: 28 U/L (ref 0–37)
Albumin: 4.2 g/dL (ref 3.5–5.2)
Alkaline Phosphatase: 94 U/L (ref 39–117)
BUN: 22 mg/dL (ref 6–23)
CO2: 27 mEq/L (ref 19–32)
Calcium: 11.2 mg/dL — ABNORMAL HIGH (ref 8.4–10.5)
Chloride: 98 mEq/L (ref 96–112)
Creatinine, Ser: 0.7 mg/dL (ref 0.50–1.10)
GFR calc Af Amer: >90 mL/min (ref >90)
GFR calc non Af Amer: 88 mL/min — ABNORMAL LOW (ref >90)
Glucose, Bld: 116 mg/dL — ABNORMAL HIGH (ref 70–99)
Potassium: 3.8 mEq/L (ref 3.5–5.1)
Sodium: 138 mEq/L (ref 135–145)
Total Bilirubin: 0.4 mg/dL (ref 0.3–1.2)
Total Protein: 7.1 g/dL (ref 6.0–8.3)

## 2013-03-31 LAB — ABO/RH: ABO/RH(D): AB NEG

## 2013-03-31 LAB — SURGICAL PCR SCREEN: MRSA, PCR: NEGATIVE

## 2013-03-31 LAB — TYPE AND SCREEN: ABO/RH(D): AB NEG

## 2013-03-31 NOTE — Pre-Procedure Instructions (Signed)
[name] [facility]  03/31/2013   Your procedure is scheduled on: September 17  Report to [facility] at 08:15 AM.  Call this number if you have problems the morning of surgery: [phone]   Remember:   Do not eat food or drink liquids after midnight.   Take these medicines the morning of surgery with A SIP OF WATER: Synthroid, Bupropion, Zyrtec, Lomotil, Nexium, Flonase, Hydrocodone (if needed), Sertraline, Tramadol (if needed)   STOP Vitamin C, Aspirin, Calcium, Fish Oil, Relafen September 10  Do not take Aspirin, Aleve, Naproxen, Advil, Ibuprofen, Vitamin, Herbs, or Supplements starting today  Do not wear jewelry, make-up or nail polish.  Do not wear lotions, powders, or perfumes. You may wear deodorant.  Do not shave 48 hours prior to surgery. Men may shave face and neck.  Do not bring valuables to the hospital.  [facility] is not responsible                   for any belongings or valuables.  Contacts, dentures or bridgework may not be worn into surgery.  Leave suitcase in the car. After surgery it may be brought to your room.  For patients admitted to the hospital, checkout time is 11:00 AM the day of  discharge.   Special Instructions: Shower using CHG 2 nights before surgery and the night before surgery.  If you shower the day of surgery use CHG.  Use special wash - you have one bottle of CHG for all showers.  You should use approximately 1/3 of the bottle for each shower.   Please read over the following fact sheets that you were given: Pain Booklet, Coughing and Deep Breathing, Blood Transfusion Information, MRSA Information and Surgical Site Infection Prevention

## 2013-04-03 ENCOUNTER — Encounter (HOSPITAL_COMMUNITY): Payer: Self-pay

## 2013-04-03 NOTE — Progress Notes (Signed)
Anesthesia Chart Review:  Patient is a 67 year old female scheduled for L3-4, L4-5, L5-S1 laminectomy/fusion on 04/12/13 by Dr. Jeral Fruit.  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 PCP is Dr Bernadette Hoit.  EKG on 03/31/13 showed NSR, cannot rule out anterior infarct (age undetermined), non-specific ST/T wave changes, LAD.  Non-specific ST abnormality is new since 03/09/03, but appears similar to her EKG on 04/12/12 from her PCP office--which was not felt to be acutely changed from here prior EKG there.  She has no documented history CAD/MI/CHF or DM.  No CV symptoms were documented from her PAT visit.  She had a sleep study in 06/2010 that did not show any significant OSA, but she did have very fragmented sleep with very poor sleep efficiency.  Preoperative CXR and labs noted.  She will be evaluated by her assigned anesthesiologist on the day of surgery, but if she remains asymptomatic from a CV standpoint then I would anticipate that she could proceed as planned.  [facility]/Anesthesiology Phone [phone] 04/03/2013 3:55 PM

## 2013-04-05 ENCOUNTER — Telehealth: Payer: Self-pay

## 2013-04-05 MED ORDER — BUDESONIDE 9 MG PO TB24: 3.0000 mg | ORAL_TABLET | Freq: Two times a day (BID) | ORAL | Status: DC

## 2013-04-05 MED ORDER — BUDESONIDE 3 MG PO CP24: 3.0000 mg | ORAL_CAPSULE | Freq: Three times a day (TID) | ORAL | Status: DC

## 2013-04-05 NOTE — Telephone Encounter (Signed)
Pharmacist called to clarify rx for Budesonide.  I sent rx for 6mg  instead of 9mg  total.  Sent new rx for 9mg  - 3mg  tablets 3 times a day

## 2013-04-05 NOTE — Telephone Encounter (Signed)
Refilled Entocort 

## 2013-04-11 MED ORDER — CEFAZOLIN SODIUM-DEXTROSE 2-3 GM-% IV SOLR
2.0000 g | INTRAVENOUS | Status: AC
  Administered 2013-04-12: 2 g via INTRAVENOUS
  Filled 2013-04-11: qty 50

## 2013-04-12 ENCOUNTER — Encounter (HOSPITAL_COMMUNITY)
Admission: RE | Disposition: A | Discharge status: Rehabilitation Facility | Payer: Self-pay | Source: Ambulatory Visit | Attending: Neurosurgery

## 2013-04-12 ENCOUNTER — Inpatient Hospital Stay (HOSPITAL_COMMUNITY): Payer: BC Managed Care – PPO | Admitting: *Deleted

## 2013-04-12 ENCOUNTER — Inpatient Hospital Stay (HOSPITAL_COMMUNITY)
Admission: RE | Admit: 2013-04-12 | Discharge: 2013-04-19 | DRG: 755 | Disposition: A | Discharge status: Rehabilitation Facility | Payer: BC Managed Care – PPO | Source: Ambulatory Visit | Attending: Neurosurgery | Admitting: Neurosurgery

## 2013-04-12 ENCOUNTER — Encounter (HOSPITAL_COMMUNITY): Payer: Self-pay | Admitting: Vascular Surgery

## 2013-04-12 ENCOUNTER — Encounter (HOSPITAL_COMMUNITY): Payer: Self-pay | Admitting: *Deleted

## 2013-04-12 ENCOUNTER — Inpatient Hospital Stay (HOSPITAL_COMMUNITY): Payer: BC Managed Care – PPO

## 2013-04-12 DIAGNOSIS — M216X9 Other acquired deformities of unspecified foot: Secondary | ICD-10-CM | POA: Diagnosis present

## 2013-04-12 DIAGNOSIS — Z87891 Personal history of nicotine dependence: Secondary | ICD-10-CM

## 2013-04-12 DIAGNOSIS — Z800 Family history of malignant neoplasm of digestive organs: Secondary | ICD-10-CM

## 2013-04-12 DIAGNOSIS — M51379 Other intervertebral disc degeneration, lumbosacral region without mention of lumbar back pain or lower extremity pain: Secondary | ICD-10-CM | POA: Diagnosis present

## 2013-04-12 DIAGNOSIS — E785 Hyperlipidemia, unspecified: Secondary | ICD-10-CM | POA: Diagnosis present

## 2013-04-12 DIAGNOSIS — D126 Benign neoplasm of colon, unspecified: Secondary | ICD-10-CM | POA: Diagnosis present

## 2013-04-12 DIAGNOSIS — F411 Generalized anxiety disorder: Secondary | ICD-10-CM | POA: Diagnosis present

## 2013-04-12 DIAGNOSIS — R569 Unspecified convulsions: Secondary | ICD-10-CM | POA: Diagnosis present

## 2013-04-12 DIAGNOSIS — M5137 Other intervertebral disc degeneration, lumbosacral region: Secondary | ICD-10-CM | POA: Diagnosis present

## 2013-04-12 DIAGNOSIS — F339 Major depressive disorder, recurrent, unspecified: Secondary | ICD-10-CM | POA: Diagnosis present

## 2013-04-12 DIAGNOSIS — E039 Hypothyroidism, unspecified: Secondary | ICD-10-CM | POA: Diagnosis present

## 2013-04-12 DIAGNOSIS — I10 Essential (primary) hypertension: Secondary | ICD-10-CM | POA: Diagnosis present

## 2013-04-12 DIAGNOSIS — E669 Obesity, unspecified: Secondary | ICD-10-CM | POA: Diagnosis present

## 2013-04-12 DIAGNOSIS — Z7982 Long term (current) use of aspirin: Secondary | ICD-10-CM

## 2013-04-12 DIAGNOSIS — Z6832 Body mass index (BMI) 32.0-32.9, adult: Secondary | ICD-10-CM

## 2013-04-12 DIAGNOSIS — Z79899 Other long term (current) drug therapy: Secondary | ICD-10-CM

## 2013-04-12 DIAGNOSIS — M5126 Other intervertebral disc displacement, lumbar region: Principal | ICD-10-CM | POA: Diagnosis present

## 2013-04-12 DIAGNOSIS — Z86011 Personal history of benign neoplasm of the brain: Secondary | ICD-10-CM

## 2013-04-12 DIAGNOSIS — K219 Gastro-esophageal reflux disease without esophagitis: Secondary | ICD-10-CM | POA: Diagnosis present

## 2013-04-12 SURGERY — POSTERIOR LUMBAR FUSION 3 LEVEL
Anesthesia: General | Site: Back | Wound class: Clean

## 2013-04-12 MED ORDER — DIAZEPAM 5 MG PO TABS
ORAL_TABLET | ORAL | Status: AC
  Filled 2013-04-12: qty 1

## 2013-04-12 MED ORDER — 0.9 % SODIUM CHLORIDE (POUR BTL) OPTIME
TOPICAL | Status: DC | PRN
  Administered 2013-04-12: 1000 mL

## 2013-04-12 MED ORDER — CEFAZOLIN SODIUM-DEXTROSE 2-3 GM-% IV SOLR
INTRAVENOUS | Status: AC
  Filled 2013-04-12: qty 50

## 2013-04-12 MED ORDER — DIPHENOXYLATE-ATROPINE 2.5-0.025 MG PO TABS: 1.0000 | ORAL_TABLET | Freq: Two times a day (BID) | ORAL | Status: DC | PRN

## 2013-04-12 MED ORDER — BUDESONIDE 3 MG PO CP24
3.0000 mg | ORAL_CAPSULE | Freq: Three times a day (TID) | ORAL | Status: DC
  Administered 2013-04-12 – 2013-04-16 (×13): 3 mg via ORAL
  Filled 2013-04-12 (×19): qty 1

## 2013-04-12 MED ORDER — ACETAMINOPHEN 325 MG PO TABS: 650.0000 mg | ORAL_TABLET | ORAL | Status: DC | PRN

## 2013-04-12 MED ORDER — ACETAMINOPHEN 650 MG RE SUPP: 650.0000 mg | RECTAL | Status: DC | PRN

## 2013-04-12 MED ORDER — NEOSTIGMINE METHYLSULFATE 1 MG/ML IJ SOLN
INTRAMUSCULAR | Status: DC | PRN
  Administered 2013-04-12: 5 mg via INTRAVENOUS

## 2013-04-12 MED ORDER — DIPHENHYDRAMINE HCL 12.5 MG/5ML PO ELIX: 12.5000 mg | ORAL_SOLUTION | Freq: Four times a day (QID) | ORAL | Status: DC | PRN

## 2013-04-12 MED ORDER — ONDANSETRON HCL 4 MG/2ML IJ SOLN
4.0000 mg | INTRAMUSCULAR | Status: DC | PRN
  Administered 2013-04-13 (×2): 4 mg via INTRAVENOUS
  Filled 2013-04-12 (×2): qty 2

## 2013-04-12 MED ORDER — LACTATED RINGERS IV SOLN
INTRAVENOUS | Status: DC | PRN
  Administered 2013-04-12 (×3): via INTRAVENOUS

## 2013-04-12 MED ORDER — HYDROMORPHONE HCL PF 1 MG/ML IJ SOLN
0.2500 mg | INTRAMUSCULAR | Status: DC | PRN
  Administered 2013-04-12 (×4): 0.5 mg via INTRAVENOUS

## 2013-04-12 MED ORDER — ROCURONIUM BROMIDE 100 MG/10ML IV SOLN
INTRAVENOUS | Status: DC | PRN
  Administered 2013-04-12: 30 mg via INTRAVENOUS
  Administered 2013-04-12: 50 mg via INTRAVENOUS

## 2013-04-12 MED ORDER — CEFAZOLIN SODIUM 1-5 GM-% IV SOLN
1.0000 g | Freq: Three times a day (TID) | INTRAVENOUS | Status: AC
  Administered 2013-04-12 – 2013-04-13 (×2): 1 g via INTRAVENOUS
  Filled 2013-04-12 (×3): qty 50

## 2013-04-12 MED ORDER — MORPHINE SULFATE (PF) 1 MG/ML IV SOLN
INTRAVENOUS | Status: AC
  Filled 2013-04-12: qty 25

## 2013-04-12 MED ORDER — MORPHINE SULFATE (PF) 1 MG/ML IV SOLN
INTRAVENOUS | Status: DC
  Administered 2013-04-12: 3 mg via INTRAVENOUS
  Administered 2013-04-12: 16:00:00 via INTRAVENOUS
  Administered 2013-04-12: 25 mg via INTRAVENOUS
  Filled 2013-04-12: qty 25

## 2013-04-12 MED ORDER — FENTANYL CITRATE 0.05 MG/ML IJ SOLN
INTRAMUSCULAR | Status: DC | PRN
  Administered 2013-04-12 (×3): 50 ug via INTRAVENOUS
  Administered 2013-04-12 (×2): 100 ug via INTRAVENOUS

## 2013-04-12 MED ORDER — HYDROCHLOROTHIAZIDE 25 MG PO TABS
25.0000 mg | ORAL_TABLET | Freq: Every day | ORAL | Status: DC
  Administered 2013-04-13 – 2013-04-19 (×5): 25 mg via ORAL
  Filled 2013-04-12 (×7): qty 1

## 2013-04-12 MED ORDER — OXYCODONE-ACETAMINOPHEN 5-325 MG PO TABS
1.0000 | ORAL_TABLET | ORAL | Status: DC | PRN
  Administered 2013-04-12 – 2013-04-13 (×3): 2 via ORAL
  Filled 2013-04-12 (×4): qty 2

## 2013-04-12 MED ORDER — PHENOL 1.4 % MT LIQD: 1.0000 | OROMUCOSAL | Status: DC | PRN

## 2013-04-12 MED ORDER — BUPROPION HCL ER (SR) 150 MG PO TB12
150.0000 mg | ORAL_TABLET | Freq: Every morning | ORAL | Status: DC
  Administered 2013-04-13 – 2013-04-18 (×6): 150 mg via ORAL
  Filled 2013-04-12 (×7): qty 1

## 2013-04-12 MED ORDER — SERTRALINE HCL 50 MG PO TABS
50.0000 mg | ORAL_TABLET | Freq: Every day | ORAL | Status: DC
  Administered 2013-04-13 – 2013-04-19 (×7): 50 mg via ORAL
  Filled 2013-04-12 (×7): qty 1

## 2013-04-12 MED ORDER — SODIUM CHLORIDE 0.9 % IJ SOLN: 3.0000 mL | INTRAMUSCULAR | Status: DC | PRN

## 2013-04-12 MED ORDER — MIDAZOLAM HCL 5 MG/5ML IJ SOLN
INTRAMUSCULAR | Status: DC | PRN
  Administered 2013-04-12: 2 mg via INTRAVENOUS

## 2013-04-12 MED ORDER — ARTIFICIAL TEARS OP OINT
TOPICAL_OINTMENT | OPHTHALMIC | Status: DC | PRN
  Administered 2013-04-12: 1 via OPHTHALMIC

## 2013-04-12 MED ORDER — HYDROMORPHONE HCL PF 1 MG/ML IJ SOLN
INTRAMUSCULAR | Status: AC
  Filled 2013-04-12: qty 1

## 2013-04-12 MED ORDER — SODIUM CHLORIDE 0.9 % IV SOLN: 250.0000 mL | INTRAVENOUS | Status: DC

## 2013-04-12 MED ORDER — LEVOTHYROXINE SODIUM 88 MCG PO TABS
88.0000 ug | ORAL_TABLET | Freq: Every day | ORAL | Status: DC
  Administered 2013-04-13 – 2013-04-19 (×7): 88 ug via ORAL
  Filled 2013-04-12 (×7): qty 1

## 2013-04-12 MED ORDER — LIDOCAINE HCL (CARDIAC) 20 MG/ML IV SOLN
INTRAVENOUS | Status: DC | PRN
  Administered 2013-04-12: 100 mg via INTRAVENOUS

## 2013-04-12 MED ORDER — GLYCOPYRROLATE 0.2 MG/ML IJ SOLN
INTRAMUSCULAR | Status: DC | PRN
  Administered 2013-04-12: .8 mg via INTRAVENOUS

## 2013-04-12 MED ORDER — DIPHENHYDRAMINE HCL 50 MG/ML IJ SOLN: 12.5000 mg | Freq: Four times a day (QID) | INTRAMUSCULAR | Status: DC | PRN

## 2013-04-12 MED ORDER — SODIUM CHLORIDE 0.9 % IV SOLN
INTRAVENOUS | Status: DC
  Administered 2013-04-13: 11:00:00 via INTRAVENOUS

## 2013-04-12 MED ORDER — LACTATED RINGERS IV SOLN
INTRAVENOUS | Status: DC
  Administered 2013-04-12: 09:00:00 via INTRAVENOUS

## 2013-04-12 MED ORDER — NALOXONE HCL 0.4 MG/ML IJ SOLN: 0.4000 mg | INTRAMUSCULAR | Status: DC | PRN

## 2013-04-12 MED ORDER — PROPOFOL 10 MG/ML IV BOLUS
INTRAVENOUS | Status: DC | PRN
  Administered 2013-04-12: 200 mg via INTRAVENOUS

## 2013-04-12 MED ORDER — ZOLPIDEM TARTRATE 5 MG PO TABS
5.0000 mg | ORAL_TABLET | Freq: Every evening | ORAL | Status: DC | PRN
  Administered 2013-04-13 – 2013-04-16 (×4): 5 mg via ORAL
  Filled 2013-04-12 (×4): qty 1

## 2013-04-12 MED ORDER — SODIUM CHLORIDE 0.9 % IJ SOLN
3.0000 mL | Freq: Two times a day (BID) | INTRAMUSCULAR | Status: DC
  Administered 2013-04-12 – 2013-04-18 (×12): 3 mL via INTRAVENOUS

## 2013-04-12 MED ORDER — PHENYLEPHRINE HCL 10 MG/ML IJ SOLN
INTRAMUSCULAR | Status: DC | PRN
  Administered 2013-04-12: 40 ug via INTRAVENOUS
  Administered 2013-04-12 (×2): 80 ug via INTRAVENOUS
  Administered 2013-04-12: 40 ug via INTRAVENOUS

## 2013-04-12 MED ORDER — BUPROPION HCL ER (SR) 150 MG PO TB12
150.0000 mg | ORAL_TABLET | Freq: Every day | ORAL | Status: DC
  Filled 2013-04-12: qty 1

## 2013-04-12 MED ORDER — ONDANSETRON HCL 4 MG/2ML IJ SOLN: 4.0000 mg | Freq: Once | INTRAMUSCULAR | Status: DC | PRN

## 2013-04-12 MED ORDER — ONDANSETRON HCL 4 MG/2ML IJ SOLN
INTRAMUSCULAR | Status: DC | PRN
  Administered 2013-04-12: 4 mg via INTRAVENOUS

## 2013-04-12 MED ORDER — THROMBIN 20000 UNITS EX SOLR
CUTANEOUS | Status: DC | PRN
  Administered 2013-04-12: 13:00:00 via TOPICAL

## 2013-04-12 MED ORDER — PANTOPRAZOLE SODIUM 40 MG PO TBEC: 40.0000 mg | DELAYED_RELEASE_TABLET | Freq: Every day | ORAL | Status: DC

## 2013-04-12 MED ORDER — PANTOPRAZOLE SODIUM 40 MG PO TBEC
40.0000 mg | DELAYED_RELEASE_TABLET | Freq: Every day | ORAL | Status: DC
  Administered 2013-04-12 – 2013-04-18 (×6): 40 mg via ORAL
  Filled 2013-04-12 (×7): qty 1

## 2013-04-12 MED ORDER — SODIUM CHLORIDE 0.9 % IJ SOLN: 9.0000 mL | INTRAMUSCULAR | Status: DC | PRN

## 2013-04-12 MED ORDER — BUPIVACAINE-EPINEPHRINE PF 0.5-1:200000 % IJ SOLN
INTRAMUSCULAR | Status: DC | PRN
  Administered 2013-04-12: 10 mL

## 2013-04-12 MED ORDER — LORATADINE 10 MG PO TABS
10.0000 mg | ORAL_TABLET | Freq: Every day | ORAL | Status: DC
  Administered 2013-04-13 – 2013-04-19 (×7): 10 mg via ORAL
  Filled 2013-04-12 (×7): qty 1

## 2013-04-12 MED ORDER — SENNA 8.6 MG PO TABS
1.0000 | ORAL_TABLET | Freq: Two times a day (BID) | ORAL | Status: DC
  Administered 2013-04-12 – 2013-04-19 (×14): 8.6 mg via ORAL
  Filled 2013-04-12 (×14): qty 1

## 2013-04-12 MED ORDER — ATORVASTATIN CALCIUM 40 MG PO TABS
40.0000 mg | ORAL_TABLET | Freq: Every day | ORAL | Status: DC
  Administered 2013-04-12 – 2013-04-18 (×7): 40 mg via ORAL
  Filled 2013-04-12 (×8): qty 1

## 2013-04-12 MED ORDER — DIAZEPAM 5 MG PO TABS
5.0000 mg | ORAL_TABLET | Freq: Four times a day (QID) | ORAL | Status: DC | PRN
  Administered 2013-04-12 – 2013-04-17 (×16): 5 mg via ORAL
  Filled 2013-04-12 (×15): qty 1

## 2013-04-12 MED ORDER — MENTHOL 3 MG MT LOZG: 1.0000 | LOZENGE | OROMUCOSAL | Status: DC | PRN

## 2013-04-12 MED ORDER — ONDANSETRON HCL 4 MG/2ML IJ SOLN: 4.0000 mg | Freq: Four times a day (QID) | INTRAMUSCULAR | Status: DC | PRN

## 2013-04-12 SURGICAL SUPPLY — 77 items
APL SKNCLS STERI-STRIP NONHPOA (GAUZE/BANDAGES/DRESSINGS) ×1
BENZOIN TINCTURE PRP APPL 2/3 (GAUZE/BANDAGES/DRESSINGS) ×2 IMPLANT
BLADE SURG ROTATE 9660 (MISCELLANEOUS) IMPLANT
BUR ACORN 6.0 (BURR) ×2 IMPLANT
BUR MATCHSTICK NEURO 3.0 LAGG (BURR) ×2 IMPLANT
CANISTER SUCTION 2500CC (MISCELLANEOUS) ×2 IMPLANT
CAP LOCKING REVERE (Cap) ×8 IMPLANT
CLOTH BEACON ORANGE TIMEOUT ST (SAFETY) ×2 IMPLANT
CONT SPEC 4OZ CLIKSEAL STRL BL (MISCELLANEOUS) ×2 IMPLANT
COVER BACK TABLE 24X17X13 BIG (DRAPES) IMPLANT
COVER TABLE BACK 60X90 (DRAPES) ×2 IMPLANT
DRAPE C-ARM 42X72 X-RAY (DRAPES) ×4 IMPLANT
DRAPE LAPAROTOMY 100X72X124 (DRAPES) ×2 IMPLANT
DRAPE POUCH INSTRU U-SHP 10X18 (DRAPES) ×2 IMPLANT
DRSG OPSITE POSTOP 4X10 (GAUZE/BANDAGES/DRESSINGS) ×1 IMPLANT
DRSG PAD ABDOMINAL 8X10 ST (GAUZE/BANDAGES/DRESSINGS) IMPLANT
DURAPREP 26ML APPLICATOR (WOUND CARE) ×2 IMPLANT
ELECT BLADE 4.0 EZ CLEAN MEGAD (MISCELLANEOUS) ×2
ELECT REM PT RETURN 9FT ADLT (ELECTROSURGICAL) ×2
ELECTRODE BLDE 4.0 EZ CLN MEGD (MISCELLANEOUS) IMPLANT
ELECTRODE REM PT RTRN 9FT ADLT (ELECTROSURGICAL) ×1 IMPLANT
EVACUATOR 1/8 PVC DRAIN (DRAIN) IMPLANT
GAUZE SPONGE 4X4 16PLY XRAY LF (GAUZE/BANDAGES/DRESSINGS) ×2 IMPLANT
GLOVE BIO SURGEON STRL SZ8 (GLOVE) ×1 IMPLANT
GLOVE BIOGEL M 8.0 STRL (GLOVE) ×3 IMPLANT
GLOVE BIOGEL PI IND STRL 7.5 (GLOVE) IMPLANT
GLOVE BIOGEL PI INDICATOR 7.5 (GLOVE) ×3
GLOVE ECLIPSE 7.0 STRL STRAW (GLOVE) ×1 IMPLANT
GLOVE EXAM NITRILE LRG STRL (GLOVE) ×2 IMPLANT
GLOVE EXAM NITRILE MD LF STRL (GLOVE) IMPLANT
GLOVE EXAM NITRILE XL STR (GLOVE) IMPLANT
GLOVE EXAM NITRILE XS STR PU (GLOVE) IMPLANT
GLOVE INDICATOR 7.5 STRL GRN (GLOVE) ×1 IMPLANT
GLOVE INDICATOR 8.5 STRL (GLOVE) ×1 IMPLANT
GLOVE SURG SS PI 8.0 STRL IVOR (GLOVE) ×4 IMPLANT
GOWN BRE IMP SLV AUR LG STRL (GOWN DISPOSABLE) ×3 IMPLANT
GOWN BRE IMP SLV AUR XL STRL (GOWN DISPOSABLE) ×3 IMPLANT
GOWN STRL REIN 2XL LVL4 (GOWN DISPOSABLE) ×3 IMPLANT
KIT BASIN OR (CUSTOM PROCEDURE TRAY) ×2 IMPLANT
KIT INFUSE LRG II (Orthopedic Implant) ×1 IMPLANT
KIT ROOM TURNOVER OR (KITS) ×2 IMPLANT
MASTERGRAFT MATX STRIP 24CC (Orthopedic Implant) ×2 IMPLANT
MATRIX MASTERGRAFT STRIP 24CC (Orthopedic Implant) IMPLANT
MILL MEDIUM DISP (BLADE) ×1 IMPLANT
NDL HYPO 18GX1.5 BLUNT FILL (NEEDLE) IMPLANT
NDL HYPO 21X1.5 SAFETY (NEEDLE) IMPLANT
NDL HYPO 25X1 1.5 SAFETY (NEEDLE) IMPLANT
NEEDLE HYPO 18GX1.5 BLUNT FILL (NEEDLE) IMPLANT
NEEDLE HYPO 21X1.5 SAFETY (NEEDLE) IMPLANT
NEEDLE HYPO 25X1 1.5 SAFETY (NEEDLE) IMPLANT
NS IRRIG 1000ML POUR BTL (IV SOLUTION) ×2 IMPLANT
PACK LAMINECTOMY NEURO (CUSTOM PROCEDURE TRAY) ×2 IMPLANT
PAD ARMBOARD 7.5X6 YLW CONV (MISCELLANEOUS) ×6 IMPLANT
PATTIES SURGICAL .5 X1 (DISPOSABLE) ×2 IMPLANT
PATTIES SURGICAL .5 X3 (DISPOSABLE) IMPLANT
ROD CURVED 5.5MMX85MM (Rod) ×2 IMPLANT
SCREW PEDICLE 5.5MMX40MM (Screw) ×6 IMPLANT
SCREW PEDICLE 5.5MMX50MM (Screw) ×2 IMPLANT
SPACER SUSTAIN O SM 8X22 10M (Spacer) ×1 IMPLANT
SPACER SUSTAIN O SML 8X22 12MM (Spacer) ×1 IMPLANT
SPONGE GAUZE 4X4 12PLY (GAUZE/BANDAGES/DRESSINGS) ×2 IMPLANT
SPONGE LAP 4X18 X RAY DECT (DISPOSABLE) IMPLANT
SPONGE NEURO XRAY DETECT 1X3 (DISPOSABLE) IMPLANT
SPONGE SURGIFOAM ABS GEL 100 (HEMOSTASIS) ×2 IMPLANT
STRIP CLOSURE SKIN 1/2X4 (GAUZE/BANDAGES/DRESSINGS) ×3 IMPLANT
SUT VIC AB 1 CT1 18XBRD ANBCTR (SUTURE) ×2 IMPLANT
SUT VIC AB 1 CT1 8-18 (SUTURE) ×4
SUT VIC AB 2-0 CP2 18 (SUTURE) ×4 IMPLANT
SUT VIC AB 3-0 SH 8-18 (SUTURE) ×4 IMPLANT
SYR 20CC LL (SYRINGE) IMPLANT
SYR 20ML ECCENTRIC (SYRINGE) ×2 IMPLANT
SYR 5ML LL (SYRINGE) IMPLANT
T CONNECTOR ADJ 33MM-41MM (Connector) ×1 IMPLANT
TOWEL OR 17X24 6PK STRL BLUE (TOWEL DISPOSABLE) ×2 IMPLANT
TOWEL OR 17X26 10 PK STRL BLUE (TOWEL DISPOSABLE) ×2 IMPLANT
TRAY FOLEY CATH 14FRSI W/METER (CATHETERS) ×2 IMPLANT
WATER STERILE IRR 1000ML POUR (IV SOLUTION) ×2 IMPLANT

## 2013-04-12 NOTE — Anesthesia Postprocedure Evaluation (Signed)
  Anesthesia Post-op Note  Patient: [name]  Procedure(s) Performed: Procedure(s) with comments: Lumbar Three Four, Lumbar Four Five, Lumbar Five Sacral One Laminectomy/Fusion (N/A) - POSTERIOR LUMBAR FUSION 3 LEVEL  Patient Location: PACU  Anesthesia Type:General  Level of Consciousness: awake, alert , oriented and patient cooperative  Airway and Oxygen Therapy: Patient Spontanous Breathing  Post-op Pain: moderate  Post-op Assessment: Post-op Vital signs reviewed, Patient's Cardiovascular Status Stable, Respiratory Function Stable, Patent Airway, No signs of Nausea or vomiting and Pain level controlled  Post-op Vital Signs: stable  Complications: No apparent anesthesia complications

## 2013-04-12 NOTE — Preoperative (Signed)
Beta Blockers   Reason not to administer Beta Blockers:Not Applicable 

## 2013-04-12 NOTE — Transfer of Care (Signed)
Immediate Anesthesia Transfer of Care Note  Patient: [name]  Procedure(s) Performed: Procedure(s) with comments: Lumbar Three Four, Lumbar Four Five, Lumbar Five Sacral One Laminectomy/Fusion (N/A) - POSTERIOR LUMBAR FUSION 3 LEVEL  Patient Location: PACU  Anesthesia Type:General  Level of Consciousness: awake, alert  and oriented  Airway & Oxygen Therapy: Patient Spontanous Breathing and Patient connected to nasal cannula oxygen  Post-op Assessment: Report given to PACU RN, Post -op Vital signs reviewed and stable and Patient moving all extremities X 4  Post vital signs: Reviewed and stable  Complications: No apparent anesthesia complications

## 2013-04-12 NOTE — Progress Notes (Signed)
Orthopedic Tech Progress Note Patient Details:  [name] 1946/03/24 [id number]  Patient ID: [name], female   DOB: 04/06/1946, 67 y.o.   MRN: [id number] Called [employer] with brace order  Nikki Dom 04/12/2013, 7:25 PM

## 2013-04-12 NOTE — Progress Notes (Signed)
Patient transferred to floor without brace. Paged ortho tech regarding patients lumbar brace. Awaiting call back.

## 2013-04-12 NOTE — Progress Notes (Signed)
Op note [phone]

## 2013-04-12 NOTE — H&P (Signed)
[name] is an 67 y.o. female.   Chief Complaint: lbp HPI: patient who came to my office with the complain of lbp, radiation to both legs, for several months. Pain gets better with sitting and worse with ambulation. 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 Benign neoplasm of colon   . Depressive disorder, not elsewhere classified   . Anxiety state, unspecified   . Other and unspecified hyperlipidemia   . Esophageal reflux   . Obesity   . Hypothyroidism   . Hemorrhoids   . Colon polyps     hyperplastic  . Family history of colon cancer mother  . Lymphocytic colitis   . Contact lens/glasses fitting     wears glassses or contacts  . Complications affecting other specified body systems, hypertension     No cardiologist   . Seizures     x 1  . Brain tumor     Past Surgical History  Procedure Laterality Date  . Craniotomy    . Back surgery    . Abdominal hysterectomy    . Appendectomy    . Carpal tunnel release      right  . Brain surgery  1999    benign tumor  . Hammertoe reconstruction with weil osteotomy  07/07/2012    Procedure: HAMMERTOE RECONSTRUCTION WITH WEIL OSTEOTOMY;  Surgeon: Toni Arthurs, MD;  Location: [facility];  Service: Orthopedics;  Laterality: Left;  Left Second through Fourth Hammertoe Correction; Left Fifth Toe Flexor Tendon Release, and Left Second through Fourth Weil Osteotomy    Family History  Problem Relation Age of Onset  . Breast cancer Mother   . Colon cancer Mother   . Lung cancer Maternal Uncle   . Bone cancer Maternal Grandmother   . Heart attack Father    Social History:  reports that she quit smoking about 2 years ago. She has never used smokeless tobacco. She reports that she drinks about 3.5 ounces of alcohol per week. She reports that she does not use illicit drugs.  Allergies:  Allergies  Allergen Reactions  . Codeine Other (See Comments)    nightmares  .  Hydrocodone-Acetaminophen Itching    Has to take benadryl with to tolerate  . Hydrocodone Itching    Has to take benadryl to tolerate med  . Sulfonamide Derivatives Rash    Medications Prior to Admission  Medication Sig Dispense Refill  . Ascorbic Acid (VITAMIN C) 1000 MG tablet Take 1,000 mg by mouth daily.        Marland Kitchen aspirin 81 MG tablet Take 81 mg by mouth 2 (two) times daily.       Marland Kitchen atorvastatin (LIPITOR) 40 MG tablet Take 1 tablets by mouth once daily.      Marland Kitchen buPROPion (WELLBUTRIN SR) 150 MG 12 hr tablet Take 150 mg by mouth daily.       . Calcium Carbonate-Vitamin D 600-400 MG-UNIT per tablet Take 1 tablet by mouth 2 (two) times daily.        . cetirizine (ZYRTEC) 10 MG tablet Take 10 mg by mouth daily. "aller-tec"      . diphenoxylate-atropine (LOMOTIL) 2.5-0.025 MG per tablet Take 1 tablet by mouth 2 (two) times daily.      Marland Kitchen esomeprazole (NEXIUM) 40 MG capsule Take 40 mg by mouth daily before breakfast.        . fluticasone (FLONASE) 50 MCG/ACT nasal spray Place 2 sprays into  the nose daily.      . hydrochlorothiazide 25 MG tablet Take 25 mg by mouth daily.       Marland Kitchen HYDROcodone-acetaminophen (NORCO) 10-325 MG per tablet Take 1 tablet by mouth every 6 (six) hours as needed for pain.      . hydrocortisone (ANUSOL-HC) 25 MG suppository PLACE 1 SUPPOSITORY (25 MG TOTAL) RECTALLY AT BEDTIME AS NEEDED.  100 suppository  0  . hydrocortisone acetate 0.5 % cream Apply 1 application topically daily as needed (for hemorrhoids).      Marland Kitchen levothyroxine (SYNTHROID, LEVOTHROID) 88 MCG tablet Take 88 mcg by mouth daily.      . Melatonin 5 MG CAPS Take 10 mg by mouth daily.       . nabumetone (RELAFEN) 500 MG tablet Take 500 mg by mouth 2 (two) times daily.       . Omega-3 Fatty Acids (FISH OIL) 1000 MG CAPS Take 1 capsule by mouth 2 (two) times daily.       . sertraline (ZOLOFT) 50 MG tablet Take 50 mg by mouth daily.      . traMADol (ULTRAM) 50 MG tablet Take 50 mg by mouth every 8 (eight) hours as  needed.      . budesonide (ENTOCORT EC) 3 MG 24 hr capsule Take 1 capsule (3 mg total) by mouth 3 (three) times daily.  270 capsule  1  . Budesonide 9 MG TB24 Take 3 mg by mouth 2 (two) times daily.  540 tablet  1    No results found for this or any previous visit (from the past 48 hour(s)). No results found.  Review of Systems  Constitutional: Negative.   HENT: Positive for neck pain.   Eyes: Negative.   Respiratory: Negative.   Cardiovascular:       Arterial hypertension  Gastrointestinal:       Indigestion  Genitourinary: Positive for urgency.  Musculoskeletal: Positive for back pain.  Skin: Negative.   Neurological: Positive for sensory change and focal weakness.  Psychiatric/Behavioral: Positive for depression.    There were no vitals taken for this visit. Physical Exam hent, scar from previous craniotomy. Neck, nl. Cv, nl. Lungs, clear. Abdomen, soft. Extremities foot drop. Neuro, mild df right foot. Foot dropleft . SLR  Positive at 50 degrees. 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   Assessment/Plan Decompression and fusion from l3 to s1. Patient and husband are aware of risks and benefits with the surgery  [name] 04/12/2013, 8:50 AM

## 2013-04-12 NOTE — Anesthesia Preprocedure Evaluation (Addendum)
Anesthesia Evaluation  Patient identified by MRN, date of birth, ID band Patient awake    Reviewed: Allergy & Precautions, H&P , NPO status , Patient's Chart, lab work & pertinent test results  Airway Mallampati: II  Neck ROM: Full  Mouth opening: Limited Mouth Opening  Dental  (+) Teeth Intact and Dental Advisory Given   Pulmonary former smoker,          Cardiovascular hypertension, + dysrhythmias     Neuro/Psych Seizures -,  PSYCHIATRIC DISORDERS    GI/Hepatic GERD-  ,  Endo/Other  Hypothyroidism   Renal/GU      Musculoskeletal   Abdominal   Peds  Hematology   Anesthesia Other Findings Hx of brain tumor  Reproductive/Obstetrics                          Anesthesia Physical Anesthesia Plan  ASA: II  Anesthesia Plan: General   Post-op Pain Management:    Induction: Intravenous  Airway Management Planned: Oral ETT  Additional Equipment:   Intra-op Plan:   Post-operative Plan: Extubation in OR  Informed Consent: I have reviewed the patients History and Physical, chart, labs and discussed the procedure including the risks, benefits and alternatives for the proposed anesthesia with the patient or authorized representative who has indicated his/her understanding and acceptance.     Plan Discussed with: CRNA, Anesthesiologist and Surgeon  Anesthesia Plan Comments:         Anesthesia Quick Evaluation

## 2013-04-13 ENCOUNTER — Encounter (HOSPITAL_COMMUNITY): Payer: Self-pay | Admitting: *Deleted

## 2013-04-13 ENCOUNTER — Other Ambulatory Visit: Payer: Self-pay

## 2013-04-13 ENCOUNTER — Telehealth: Payer: Self-pay

## 2013-04-13 MED ORDER — DEXAMETHASONE SODIUM PHOSPHATE 4 MG/ML IJ SOLN
6.0000 mg | INTRAMUSCULAR | Status: AC
  Administered 2013-04-13 – 2013-04-14 (×3): 6 mg via INTRAVENOUS
  Filled 2013-04-13 (×3): qty 2

## 2013-04-13 MED ORDER — DIPHENOXYLATE-ATROPINE 2.5-0.025 MG PO TABS: 1.0000 | ORAL_TABLET | Freq: Two times a day (BID) | ORAL | Status: DC

## 2013-04-13 MED ORDER — INFLUENZA VAC SPLIT QUAD 0.5 ML IM SUSP
0.5000 mL | INTRAMUSCULAR | Status: AC
  Administered 2013-04-14: 0.5 mL via INTRAMUSCULAR
  Filled 2013-04-13: qty 0.5

## 2013-04-13 MED ORDER — OXYCODONE HCL 5 MG PO TABS
15.0000 mg | ORAL_TABLET | ORAL | Status: DC | PRN
  Administered 2013-04-13 (×3): 15 mg via ORAL
  Filled 2013-04-13 (×4): qty 3

## 2013-04-13 MED ORDER — PNEUMOCOCCAL VAC POLYVALENT 25 MCG/0.5ML IJ INJ
0.5000 mL | INJECTION | INTRAMUSCULAR | Status: AC
  Administered 2013-04-14: 0.5 mL via INTRAMUSCULAR
  Filled 2013-04-13: qty 0.5

## 2013-04-13 MED ORDER — OXYCODONE-ACETAMINOPHEN 5-325 MG PO TABS
1.0000 | ORAL_TABLET | ORAL | Status: DC | PRN
  Administered 2013-04-13 – 2013-04-14 (×4): 2 via ORAL
  Filled 2013-04-13 (×5): qty 2

## 2013-04-13 MED FILL — Heparin Sodium (Porcine) Inj 1000 Unit/ML: INTRAMUSCULAR | Qty: 30 | Status: AC

## 2013-04-13 MED FILL — Sodium Chloride IV Soln 0.9%: INTRAVENOUS | Qty: 1000 | Status: AC

## 2013-04-13 NOTE — Consult Note (Signed)
Physical Medicine and Rehabilitation Consult Reason for Consult: Lumbar radiculopathy Referring Physician: Dr. Jeral Fruit   HPI: [name] is a 67 y.o. 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 Admitted 04/12/2013 with progressive low back pain radiating to her extremities. X-rays and imaging revealed degenerative disc disease as well as lumbar L3-4 central disc protrusion with stenosis/radiculopathy. Underwent bilateral L3, 4, 5 laminectomy facetectomy was interbody fusion with pedicle screws 04/13/2013 per Dr. Jeral Fruit. Postoperative pain management. Back brace when out of bed. Physical and occupational therapy evaluations pending. M.D. is requested physical medicine rehabilitation consult to consider inpatient rehabilitation services   Review of Systems  Gastrointestinal:       GERD  Musculoskeletal: Positive for back pain and falls.  Neurological: Positive for seizures and weakness.  Psychiatric/Behavioral: Positive for depression.       Anxiety  All other systems reviewed and are negative.   Past Medical History  Diagnosis Date  . Benign neoplasm of colon   . Depressive disorder, not elsewhere classified   . Anxiety state, unspecified   . Other and unspecified hyperlipidemia   . Esophageal reflux   . Obesity   . Hypothyroidism   . Hemorrhoids   . Colon polyps     hyperplastic  . Family history of colon cancer mother  . Lymphocytic colitis   . Contact lens/glasses fitting     wears glassses or contacts  . Complications affecting other specified body systems, hypertension     No cardiologist   . Seizures     x 1  . Brain tumor    Past Surgical History  Procedure Laterality Date  . Craniotomy    . Back surgery    . Abdominal hysterectomy    . Appendectomy    . Carpal tunnel release     right  . Brain surgery  1999    benign tumor  . Hammertoe reconstruction with weil osteotomy  07/07/2012    Procedure: HAMMERTOE RECONSTRUCTION WITH WEIL OSTEOTOMY;  Surgeon: Toni Arthurs, MD;  Location: [facility];  Service: Orthopedics;  Laterality: Left;  Left Second through Fourth Hammertoe Correction; Left Fifth Toe Flexor Tendon Release, and Left Second through Fourth Weil Osteotomy   Family History  Problem Relation Age of Onset  . Breast cancer Mother   . Colon cancer Mother   . Lung cancer Maternal Uncle   . Bone cancer Maternal Grandmother   . Heart attack Father    Social History:  reports that she quit smoking about 2 years ago. She has never used smokeless tobacco. She reports that she drinks about 3.5 ounces of alcohol per week. She reports that she does not use illicit drugs. Allergies:  Allergies  Allergen Reactions  . Codeine Other (See Comments)    nightmares  . Hydrocodone-Acetaminophen Itching    Has to take benadryl with to tolerate  . Hydrocodone Itching    Has to take benadryl to tolerate med  . Sulfonamide Derivatives Rash   Medications Prior to Admission  Medication Sig Dispense Refill  . Ascorbic Acid (VITAMIN C) 1000 MG tablet Take 1,000 mg by mouth daily.        Marland Kitchen aspirin 81 MG tablet Take 81 mg by mouth 2 (two) times daily.       Marland Kitchen atorvastatin (LIPITOR) 40 MG tablet Take 1 tablets by mouth once daily.      Marland Kitchen  budesonide (ENTOCORT EC) 3 MG 24 hr capsule Take 1 capsule (3 mg total) by mouth 3 (three) times daily.  270 capsule  1  . buPROPion (WELLBUTRIN SR) 150 MG 12 hr tablet Take 150 mg by mouth daily.       . Calcium Carbonate-Vitamin D 600-400 MG-UNIT per tablet Take 1 tablet by mouth 2 (two) times daily.        . cetirizine (ZYRTEC) 10 MG tablet Take 10 mg by mouth daily. "aller-tec"      . diphenoxylate-atropine (LOMOTIL) 2.5-0.025 MG per tablet Take 1 tablet by mouth 2 (two) times daily.      Marland Kitchen esomeprazole (NEXIUM) 40 MG capsule  Take 40 mg by mouth daily before breakfast.        . fluticasone (FLONASE) 50 MCG/ACT nasal spray Place 2 sprays into the nose daily.      . hydrochlorothiazide 25 MG tablet Take 25 mg by mouth daily.       Marland Kitchen HYDROcodone-acetaminophen (NORCO) 10-325 MG per tablet Take 1 tablet by mouth every 6 (six) hours as needed for pain.      . hydrocortisone (ANUSOL-HC) 25 MG suppository PLACE 1 SUPPOSITORY (25 MG TOTAL) RECTALLY AT BEDTIME AS NEEDED.  100 suppository  0  . hydrocortisone acetate 0.5 % cream Apply 1 application topically daily as needed (for hemorrhoids).      Marland Kitchen levothyroxine (SYNTHROID, LEVOTHROID) 88 MCG tablet Take 88 mcg by mouth daily.      . Melatonin 5 MG CAPS Take 10 mg by mouth daily.       . nabumetone (RELAFEN) 500 MG tablet Take 500 mg by mouth 2 (two) times daily.       . Omega-3 Fatty Acids (FISH OIL) 1000 MG CAPS Take 1 capsule by mouth 2 (two) times daily.       . sertraline (ZOLOFT) 50 MG tablet Take 50 mg by mouth daily.      . traMADol (ULTRAM) 50 MG tablet Take 50 mg by mouth every 8 (eight) hours as needed.      . Budesonide 9 MG TB24 Take 3 mg by mouth 2 (two) times daily.  540 tablet  1    Home: Home Living Family/patient expects to be discharged to:: Private residence Living Arrangements: Spouse/significant other  Functional History:   Functional Status:  Mobility:          ADL:    Cognition: Cognition Orientation Level: Oriented X4    Blood pressure 112/56, pulse 86, temperature 98.9 F (37.2 C), temperature source Oral, resp. rate 18, SpO2 94.00%. Physical Exam  Vitals reviewed. Constitutional: She is oriented to person, place, and time. She appears well-developed.  Eyes: EOM are normal.  Neck: Normal range of motion. Neck supple. No thyromegaly present.  Cardiovascular: Normal rate and regular rhythm.   Pulmonary/Chest: Effort normal and breath sounds normal. No respiratory distress.  Abdominal: Soft. Bowel sounds are normal. She exhibits  no distension.  Neurological: She is alert and oriented to person, place, and time.  Follows full commands  Skin:  Back incision is dressed    No results found for this or any previous visit (from the past 24 hour(s)). Dg Lumbar Spine 2-3 Views  04/12/2013   CLINICAL DATA:  Lumbar fusion L3 through S1.  EXAM: LUMBAR SPINE - 2-3 VIEW; DG C-ARM 1-60 MIN  COMPARISON:  CT myelogram 01/24/2013  FINDINGS: AP and lateral C-arm images were obtained. Placement of bilateral pedicle screws at L3, L4, L5, and S1. Posterior  rods are not placed at this time. Interbody spacers at L3-4 and L4-5 in good position.  IMPRESSION: Lumbar fusion as above.   Electronically Signed   By: Marlan Palau M.D.   On: 04/12/2013 15:37   Dg Lumbar Spine 1 View  04/12/2013   CLINICAL DATA:  L3-S1 posterior fusion.  EXAM: LUMBAR SPINE - 1 VIEW  COMPARISON:  CT myelogram 01/24/2013  FINDINGS: Posterior surgical instruments are noted projecting over the superior aspect of the L4 vertebral body and the S1-2 level.  IMPRESSION: Intraoperative localization as above.   Electronically Signed   By: Charlett Nose M.D.   On: 04/12/2013 15:30   Dg C-arm 1-60 Min  04/12/2013   CLINICAL DATA:  Lumbar fusion L3 through S1.  EXAM: LUMBAR SPINE - 2-3 VIEW; DG C-ARM 1-60 MIN  COMPARISON:  CT myelogram 01/24/2013  FINDINGS: AP and lateral C-arm images were obtained. Placement of bilateral pedicle screws at L3, L4, L5, and S1. Posterior rods are not placed at this time. Interbody spacers at L3-4 and L4-5 in good position.  IMPRESSION: Lumbar fusion as above.   Electronically Signed   By: Marlan Palau M.D.   On: 04/12/2013 15:37    Assessment/Plan: Diagnosis: lumbar hnp s/p fusion  Both PT and OT are already recommending HH services. I will defer a formal consult.   Thanks  Ranelle Oyster, MD, [facility] Physical Medicine & Rehabilitation    04/13/2013

## 2013-04-13 NOTE — Progress Notes (Signed)
Patients PCA turned off and morphine turned off during the night due to patient not being able to rest with all the beeping and nasal cannula and pt felt she did not need it at this time. No morphine has been self delivered since last night. Pt currently is taking percocet and valium for pain and is tolerating well. Will ask MD to d/c PCA orders upon rounds this AM. Brace delivered this morning and foley catheter removed at 0940. Catheter output was 280cc amber/cloudy since 0700. Fluids encouraged. Will continue to monitor. Marcee Jacobs, [place] 04/13/2013 9:46 AM

## 2013-04-13 NOTE — Evaluation (Signed)
Occupational Therapy Evaluation Patient Details Name: [name] MRN: [id number] DOB: 13-Nov-1945 Today's Date: 04/13/2013 Time: 5621-3086 OT Time Calculation (min): 38 min  OT Assessment / Plan / Recommendation History of present illness 67 yo female s/p Bilateral L3, L4, L5 laminectomy and facetectomy. PLIF L3-4 L4-5 L5 - S1 Pt with hx of craniotomy and Lt foot droop   Clinical Impression   Patient is s/p 3 level fusion surgery resulting in functional limitations due to the deficits listed below (see OT problem list).  Patient will benefit from skilled OT acutely to increase independence and safety with ADLS to allow discharge HHOT. Pt currently with Lt LE buckle and will need improvement in controlling LT LE prior to safe d/c home. If pt does not progress with LT LE then recommendation could change.     OT Assessment  Patient needs continued OT Services    Follow Up Recommendations  Home health OT    Barriers to Discharge      Equipment Recommendations  None recommended by OT    Recommendations for Other Services    Frequency  Min 2X/week    Precautions / Restrictions Precautions Precautions: Back Precaution Comments: back handout provided Required Braces or Orthoses: Other Brace/Splint Other Brace/Splint: Left foot AFO   Pertinent Vitals/Pain 3 out 10 at EOB sitting    ADL  Eating/Feeding: Set up Where Assessed - Eating/Feeding: Chair Grooming: Teeth care;Wash/dry face;Set up Where Assessed - Grooming: Supported sitting Lower Body Dressing: Moderate assistance Where Assessed - Lower Body Dressing: Unsupported sitting Toilet Transfer: Moderate assistance Toilet Transfer Method: Sit to stand Toilet Transfer Equipment: Raised toilet seat with arms (or 3-in-1 over toilet) Toileting - Clothing Manipulation and Hygiene: Maximal assistance Where Assessed - Toileting Clothing Manipulation and Hygiene: Sit to stand from 3-in-1 or toilet Equipment Used: Back brace;Gait  belt;Rolling walker Transfers/Ambulation Related to ADLs: Pt with static standing and demonstrates Lt LE weakness. pt completed stand pivot  and static standing during session. pt with left LE buckle during session x3. Pt with a fear of falling due to past falls ADL Comments: Pt supine on arrival and educated on back precautions. Pt provided handout and educated. Pt s spouse present for all education. Pt educated on brace proper positioning and will need further return demo from patient in next session. pt completed oral care in chair due to Lt LE weakness. Pt with heavy use of BIL UE on rw at this time and not safe for static standing at sink    OT Diagnosis: Generalized weakness;Acute pain  OT Problem List: Decreased strength;Decreased activity tolerance;Impaired balance (sitting and/or standing);Decreased safety awareness;Decreased knowledge of use of DME or AE;Decreased knowledge of precautions;Pain OT Treatment Interventions: Self-care/ADL training;Therapeutic exercise;DME and/or AE instruction;Therapeutic activities;Patient/family education;Balance training   OT Goals(Current goals can be found in the care plan section) Acute Rehab OT Goals Patient Stated Goal: to return home to dogs OT Goal Formulation: With patient/family Time For Goal Achievement: 04/27/13 Potential to Achieve Goals: Good  Visit Information  Last OT Received On: 04/13/13 Assistance Needed: +1 History of Present Illness: 67 yo female s/p Bilateral L3, L4, L5 laminectomy and facetectomy. PLIF L3-4 L4-5 L5 - S1 Pt with hx of craniotomy and Lt foot droop       Prior Functioning     Home Living Family/patient expects to be discharged to:: Private residence Living Arrangements: Spouse/significant other (2 dogs boxers) Available Help at Discharge: Available PRN/intermittently (husband works from home but does require some travel) Type of  Home: House Home Access: Stairs to enter [employer] of Steps:  3 Entrance Stairs-Rails: None Home Layout: One level;Able to live on main level with bedroom/bathroom Home Equipment: Shower seat - built in;Hand held shower head;Grab bars - tub/shower;Grab bars - toilet;Cane - single point;Walker - 2 wheels;Crutches Prior Function Level of Independence: Independent Communication Communication: No difficulties Dominant Hand: Right         Vision/Perception Vision - History Baseline Vision: Wears contacts Patient Visual Report: No change from baseline Vision - Assessment Vision Assessment: Vision not tested   Cognition  Cognition Arousal/Alertness: Awake/alert Behavior During Therapy: WFL for tasks assessed/performed Overall Cognitive Status: Within Functional Limits for tasks assessed    Extremity/Trunk Assessment Upper Extremity Assessment Upper Extremity Assessment: Overall WFL for tasks assessed Lower Extremity Assessment Lower Extremity Assessment: Defer to PT evaluation (Left LE buckling during evaluation) Cervical / Trunk Assessment Cervical / Trunk Assessment: Normal     Mobility Bed Mobility Bed Mobility: Supine to Sit;Sitting - Scoot to [place] of Bed;Rolling Left;Left Sidelying to Sit Rolling Left: 4: Min guard;With rail Left Sidelying to Sit: 4: Min guard;HOB elevated;With rails Supine to Sit: 4: Min assist;With rails;HOB elevated Sitting - Scoot to Edge of Bed: 4: Min assist Details for Bed Mobility Assistance: pt educated on sequence and good return demo Transfers Transfers: Sit to Stand;Stand to [employer] to Stand: 3: Mod assist;With upper extremity assist;From bed Stand to Sit: 3: Mod assist;With upper extremity assist;To chair/3-in-1 Details for Transfer Assistance: Pt with uncontrolled descend to chair. Pt states "that has been a problem for me"     Exercise     Balance     End of Session OT - End of Session Equipment Utilized During Treatment: Gait belt Activity Tolerance: Patient tolerated treatment well Patient  left: in chair;with call bell/phone within reach;with family/visitor present Nurse Communication: Mobility status;Precautions  GO     [name] 04/13/2013, 9:31 AM Pager: [phone]

## 2013-04-13 NOTE — Progress Notes (Signed)
Pt had 300 ml of urine output during shift. Urine is very concentrated. Leaving foley in until brace arrives. Will pass on to oncoming shift to inform Dr. Jeral Fruit. PO fluids encouraged. Bladder scan showed various amounts in the bladder, none surpassing 100 ml. Will continue to monitor.

## 2013-04-13 NOTE — Progress Notes (Signed)
Removed hemovac per MD order. Site looks unremarkable with no drainage, incision site with steris clean,dry,intact. No new drainage on opsite dressing since last marked at 0800. Voiding post foley removal a total of 400cc dark, yellow concentrated urine. Continued running IV fluids due to occasional nausea and lack of appetite. Ambulated in hallway 75 feet with min assist + walker. Encouraged to drink plenty of water and to continue trying to ambulate in halls with assistance as tolerated to ease pain in her legs. Will continue to monitor.

## 2013-04-13 NOTE — Telephone Encounter (Signed)
Refilled Lomotil 

## 2013-04-13 NOTE — Progress Notes (Signed)
Patient ID: [name], female   DOB: 01-15-1946, 67 y.o.   MRN: [id number] doing well. Off morphine. ambulating with help. Minimal drainage.

## 2013-04-13 NOTE — Progress Notes (Signed)
Utilization review completed. Virginio Isidore, RN, BSN. 

## 2013-04-13 NOTE — Evaluation (Signed)
Physical Therapy Evaluation Patient Details Name: [name] MRN: [id number] DOB: 1945-12-07 Today's Date: 04/13/2013 Time: 0931-1002 PT Time Calculation (min): 31 min  PT Assessment / Plan / Recommendation History of Present Illness  67 yo female s/p Bilateral L3, L4, L5 laminectomy and facetectomy. PLIF L3-4 L4-5 L5 - S1 Pt with hx of craniotomy and Lt foot droop  Clinical Impression  Patient is s/p Bilateral L3, L4, L5 laminectomy and facetectomy surgery resulting in the deficits listed below (see PT Problem List).  Patient will benefit from skilled PT to increase their independence and safety with mobility (while adhering to their precautions) to allow discharge home with her spouse and follow-up HHPT. Pt does demonstrate Left quad weakness and has some knee buckling with transfers and gait. Will continue to focus on LE strengthening and increasing Independence for d/c home.     PT Assessment  Patient needs continued PT services    Follow Up Recommendations  Home health PT    Does the patient have the potential to tolerate intense rehabilitation      Barriers to Discharge        Equipment Recommendations  None recommended by PT    Recommendations for Other Services     Frequency Min 5X/week    Precautions / Restrictions Precautions Precautions: Back Precaution Booklet Issued: No Precaution Comments: Pt able to recall 3/3 back precautions from OT training. Required Braces or Orthoses: Spinal Brace Spinal Brace: Lumbar corset;Applied in sitting position Other Brace/Splint: L  AFO Restrictions Weight Bearing Restrictions: No   Pertinent Vitals/Pain       Mobility  Bed Mobility Bed Mobility: Sit to Supine Rolling Left: 4: Min guard;With rail Left Sidelying to Sit: 4: Min guard;HOB elevated;With rails Supine to Sit: 4: Min assist;With rails;HOB elevated Sitting - Scoot to Edge of Bed: 4: Min assist Sit to Supine: 4: Min assist;HOB flat Details for Bed Mobility  Assistance: cues for technique Transfers Sit to Stand: 4: Min guard;With upper extremity assist;From chair/3-in-1 Stand to Sit: 4: Min guard;To bed;With upper extremity assist Details for Transfer Assistance: cues for hand placement, definite need for UE use and weight shifted over R LE due to Left quad weakness. Ambulation/Gait Ambulation/Gait Assistance: 4: Min assist Ambulation Distance (Feet): 25 Feet Assistive device: Rolling walker Ambulation/Gait Assistance Details: Left knee bucking with turns to left and when weight shifting over Left LE Gait Pattern: Step-to pattern;Decreased stride length;Decreased weight shift to left;Left steppage Gait velocity: decreased Stairs: No    Exercises Total Joint Exercises Long Arc Quad: AROM;Strengthening;Left;10 reps;Seated   PT Diagnosis: Difficulty walking  PT Problem List: Decreased strength;Decreased activity tolerance;Decreased balance;Decreased mobility;Decreased knowledge of precautions PT Treatment Interventions: DME instruction;Gait training;Stair training;Functional mobility training;Therapeutic activities;Therapeutic exercise;Balance training;Patient/family education     PT Goals(Current goals can be found in the care plan section) Acute Rehab PT Goals Patient Stated Goal: To go home PT Goal Formulation: With patient Time For Goal Achievement: 04/20/13 Potential to Achieve Goals: Good  Visit Information  Last PT Received On: 04/13/13 Assistance Needed: +1 History of Present Illness: 67 yo female s/p Bilateral L3, L4, L5 laminectomy and facetectomy. PLIF L3-4 L4-5 L5 - S1 Pt with hx of craniotomy and Lt foot droop       Prior Functioning  Home Living Family/patient expects to be discharged to:: Private residence Living Arrangements: Spouse/significant other (2 dogs boxers) Available Help at Discharge: Available PRN/intermittently (husband works from home but does require some travel) Type of Home: House Home Access: Stairs  to enter Entrance Stairs-Number of Steps: 3 Entrance Stairs-Rails: None Home Layout: One level;Able to live on main level with bedroom/bathroom Home Equipment: Shower seat - built in;Hand held shower head;Grab bars - tub/shower;Grab bars - toilet;Cane - single point;Walker - 2 wheels;Crutches Prior Function Level of Independence: Independent Communication Communication: No difficulties Dominant Hand: Right    Cognition  Cognition Arousal/Alertness: Awake/alert Behavior During Therapy: WFL for tasks assessed/performed Overall Cognitive Status: Within Functional Limits for tasks assessed    Extremity/Trunk Assessment Upper Extremity Assessment Upper Extremity Assessment: Defer to OT evaluation Lower Extremity Assessment Lower Extremity Assessment: LLE deficits/detail LLE Deficits / Details: left quad 3/5, hip flexion 2/5 ankle H/O foot drop ( NT due to in AFO and sneakers) Cervical / Trunk Assessment Cervical / Trunk Assessment: Normal   Balance Balance Balance Assessed: Yes Static Sitting Balance Static Sitting - Balance Support: No upper extremity supported Static Sitting - Level of Assistance: 7: Independent Static Standing Balance Static Standing - Balance Support: Bilateral upper extremity supported Static Standing - Level of Assistance: 5: Stand by assistance  End of Session PT - End of Session Equipment Utilized During Treatment: Gait belt;Back brace;Other (comment) (Left AFO) Activity Tolerance: Patient tolerated treatment well Patient left: in bed;with call bell/phone within reach;with family/visitor present Nurse Communication: Mobility status  GP     Greggory Stallion 04/13/2013, 10:14 AM

## 2013-04-13 NOTE — Op Note (Signed)
NAMEJERRIS, KELTZ NO.:  [id number]  MEDICAL RECORD NO.:  [id number]  LOCATION:  [id number]                        FACILITY:  [facility]  PHYSICIAN:  Hilda Lias, M.D.   DATE OF BIRTH:  August 08, 1945  DATE OF PROCEDURE:  04/12/2013 DATE OF DISCHARGE:                              OPERATIVE REPORT   PREOPERATIVE DIAGNOSIS:  L3-4, L4-5, L5-S1 degenerative disk disease. Chronic radiculopathy.  Status post lumbar diskectomy.  Status post cerebral meningioma.  POSTOPERATIVE DIAGNOSIS:  L3-4, L4-5, L5-S1 degenerative disk disease. Chronic radiculopathy.  Status post lumbar diskectomy.  Status post cerebral meningioma.  PROCEDURE:  Bilateral L3, L4, L5 laminectomy and facetectomy. Diskectomy bilateral at the level of 3-4.  Diskectomy at the level 4-5 right.  Lysis of adhesions.  Interbody fusion with 1 single cage of 12 at L3-4, 1 single cage of staying at the L4-5.  Pedicle screws at L3-L4, L5-S1 bilaterally, posterolateral arthrodesis with BMP and autograft. Cell Saver.  C-arm.  SURGEON:  Hilda Lias, M.D.  ASSISTANT:  Dr. Wynetta Emery.  CLINICAL HISTORY:  Ms. Kreuser is a lady who in the past had craniotomy by me because of a meningioma.  The patient has a chronic footdrop.  Many years ago, she had lumbar intervention.  Lately, she had been complaining of back pain with radiation to both legs which gets worse with activity.  Now, she has difficulty walking because of the pain that she is having.  She had failed with conservative treatment.  X-rays show severe degenerative disk disease with foraminal stenosis at the level of 3-4, 4-5, 5-1.  In view of no improvement, surgery was advised.  She and her family knew the risks and benefits of the surgery.  DESCRIPTION OF PROCEDURE:  The patient was taken to the OR, and after intubation, she was positioned in a prone manner.  The back was cleaned first with Betadine and later on with DuraPrep.  Drapes were applied. Midline  incision from L2-3 down to L5-S1 was made and muscle were retracted all the way laterally.  We identified the spinous process.  I will remove the spinous process of L3, 4, 5.  With the drill, with bilateral laminectomy, we proceed with a facetectomy.  The facet of the L5-1 was quite loose.  Then, we proceeded with lysis of adhesions mostly on the left side over the previous surgery.  We entered the disk space at the L3-4, 1st in the right side and then in the left side and total diskectomy was done.  We were able to introduce one single cage of 12 x 22 in the midline.  The cage had BMP and autograft.  At the level of 4- 5, I was only able to go into the disk space from the right side with total diskectomy was accomplished.  Because of the history, I was able to go from the left side.  Then, a single cage of 12 x 22 was inserted. At the level 5-1, the area was quite narrow.  It was difficult to insert including a needle.  Then, the 2 disk with filled up with the rest of the bone graft and BMP.  Then, using the C-arm 1st in  AP view and then a lateral view, we probed the pedicle for 3, 4, 5, 1.  We introduced a total of 8 screws with a size 5.5 x 40 only at the level of S1, it was a 50 mm.  Prior to introduction of the screws, we feel the hole just to be sure that we were surrounded by bone.  Then, the screw were connected with the rod and kept in place with caps.  A cross-link from right to left was done.  Then, we went laterally and we removed the periosteum of 3-4, 4-5, 5-1, and a mix of BMP Vitoss and autograft was used for arthrodesis.  We went back and look at the foramen and there was plenty of space for the nerve root.  Then, the area was irrigated.  Valsalva maneuver was negative. From then on, the wound was closed with Vicryl and Steri-Strips.  The drain was left in the epidural space.          ______________________________ Hilda Lias, M.D.     EB/MEDQ  D:  04/12/2013   T:  04/13/2013  Job:  [id number]

## 2013-04-14 MED ORDER — GABAPENTIN 300 MG PO CAPS
300.0000 mg | ORAL_CAPSULE | Freq: Three times a day (TID) | ORAL | Status: DC
  Administered 2013-04-14 – 2013-04-19 (×15): 300 mg via ORAL
  Filled 2013-04-14 (×17): qty 1

## 2013-04-14 MED ORDER — OXYCODONE HCL 5 MG PO TABS
15.0000 mg | ORAL_TABLET | ORAL | Status: DC | PRN
  Administered 2013-04-14 – 2013-04-17 (×11): 15 mg via ORAL
  Filled 2013-04-14 (×11): qty 3

## 2013-04-14 NOTE — Progress Notes (Signed)
Occupational Therapy Treatment Patient Details Name: SHADELL BRENN MRN: [id number] DOB: [birth year] Today's Date: 04/14/2013 Time: 8469-6295 OT Time Calculation (min): 49 min  OT Assessment / Plan / Recommendation  History of present illness 67 yo female s/p Bilateral L3, L4, L5 laminectomy and facetectomy. PLIF L3-4 L4-5 L5 - S1 Pt with hx of craniotomy and Lt foot droop   OT comments  Pt with incr muscle spasms and pain in LT LE this session. Pt limited by inability to maintain static standing on LT LE. Pt relies heavily on Rt LE and BIL UE with RW use. Pt states "I don't know why it wants to curl up like this" Pt with poor rest and eventful PM hours 04/13/13. Pt progressing with education during LB dressing task.   Follow Up Recommendations  Home health OT    Barriers to Discharge       Equipment Recommendations  None recommended by OT    Recommendations for Other Services    Frequency Min 2X/week   Progress towards OT Goals Progress towards OT goals: Progressing toward goals  Plan Discharge plan remains appropriate    Precautions / Restrictions Precautions Precautions: Back Precaution Comments: recalled 3 out 3 precautions Spinal Brace: Lumbar corset;Applied in sitting position Other Brace/Splint: don AFO shoe left LE Restrictions Weight Bearing Restrictions: No   Pertinent Vitals/Pain 7 out 10 RN entered room and medication requested and educated on spasms in LT LE    ADL  Eating/Feeding: Set up Where Assessed - Eating/Feeding: Chair Grooming: Teeth care;Minimal assistance Where Assessed - Grooming: Supported standing (pt unable to maintain static standing on LT LE) Lower Body Dressing: Minimal assistance Where Assessed - Lower Body Dressing: Supported sitting (using AE to help don socks / shoes, AFO) Toilet Transfer: Moderate assistance Toilet Transfer Method: Sit to stand Toilet Transfer Equipment: Raised toilet seat with arms (or 3-in-1 over toilet) Toileting -  Clothing Manipulation and Hygiene: Moderate assistance Where Assessed - Toileting Clothing Manipulation and Hygiene: Sit to stand from 3-in-1 or toilet Equipment Used: Back brace;Gait belt;Rolling walker Transfers/Ambulation Related to ADLs: pt with excellent sequence to bathroom since. ONce at sink pt with Lt LE muscle spasms and unable to maintain weight bearing on LT LE. Pt TWB at this time. Pt educated on using RW to back up to 3n1 over toilet . Pt using heavy UE use on RW and RT LE to complete transfer. Pt needed repeat education for sequence to exit bathroom due to attempting to lead iwth RT LE incr fall risk. Pt with education reinforced that sequence is opposite for ambulation forward vs back ward ADL Comments: Pt in chair this AM tearful and painful. Pt reports pain throughout session 6-7 out 10. Pt reports a rough night with use of bed pan and urgency to void. Pt with multiple linen changes due to spilled bed pans. Pt and spouse state "it was just aweful" Pt s/p breakfast with some nausea. Pt educated on using shoe horn , sock aid and reacher for LB dressing. Pt needed (A) with Lt LE due to foot drop. Pt would benefit from continued practice. Pt completed sink grooming and toilet transfer this session. Pt return to supine due to fatigue and pain.    OT Diagnosis:    OT Problem List:   OT Treatment Interventions:     OT Goals(current goals can now be found in the care plan section) Acute Rehab OT Goals Patient Stated Goal: to return hom to dogs OT Goal Formulation: With patient/family  Time For Goal Achievement: 04/27/13 Potential to Achieve Goals: Good ADL Goals Pt Will Perform Grooming: with supervision;standing Pt Will Perform Lower Body Bathing: with supervision;sit to/from stand Pt Will Perform Lower Body Dressing: with supervision;sit to/from stand Pt Will Transfer to Toilet: with supervision;bedside commode Additional ADL Goal #1: Pt will don/ doff  brace MOD I  Visit  Information  Last OT Received On: 04/14/13 Assistance Needed: +1 History of Present Illness: 67 yo female s/p Bilateral L3, L4, L5 laminectomy and facetectomy. PLIF L3-4 L4-5 L5 - S1 Pt with hx of craniotomy and Lt foot droop    Subjective Data      Prior Functioning       Cognition  Cognition Arousal/Alertness: Awake/alert Behavior During Therapy: WFL for tasks assessed/performed Overall Cognitive Status: Within Functional Limits for tasks assessed    Mobility  Bed Mobility Bed Mobility: Sit to Sidelying Left;Sit to Supine Sit to Supine: 3: Mod assist;HOB flat Sit to Sidelying Left: 3: Mod assist;HOB flat Details for Bed Mobility Assistance: pt needed (A) to lift LtLE into bed surface. Pt educated on preventing twisting with UB. Pt asking "when will my left side be strong again" Transfers Transfers: Sit to Stand;Stand to Sit Sit to Stand: 3: Mod assist;With upper extremity assist;From chair/3-in-1 Stand to Sit: 3: Mod assist;With upper extremity assist;To chair/3-in-1 Details for Transfer Assistance: pt with uncontrolled descend to 3n1 and bed surface. Pt needed cues for safe hand placement. Pt pushing with UB from chair and needing (A) due to LT LE weakness    Exercises      Balance Static Standing Balance Static Standing - Balance Support: Bilateral upper extremity supported;During functional activity Static Standing - Level of Assistance: 3: Mod assist;Other (comment) (LT LE weakness muscle spasms)   End of Session OT - End of Session Activity Tolerance: Patient tolerated treatment well Patient left: in bed;with call bell/phone within reach;with family/visitor present Nurse Communication: Mobility status;Precautions  GO     Boone Master B 04/14/2013, 9:35 AM Pager: [phone]

## 2013-04-14 NOTE — Progress Notes (Signed)
Patient ID: [name], female   DOB: June 29, 1946, 67 y.o.   MRN: [id number] Having more incisional pain and pain in left leg. Wound dry. Will get rehabilitation to re-evaluate her.

## 2013-04-14 NOTE — Clinical Social Work Note (Signed)
CSW received consult for SNF placement. At this time, PT is recommending home health PT for pt at time of discharge. CSW signing off. Please re-consult if discharge disposition changes.  Darlyn Chamber, MSW, LCSWA Clinical Social Work [phone]

## 2013-04-14 NOTE — Progress Notes (Signed)
Physical Therapy Treatment Patient Details Name: [name] MRN: [id number] DOB: 03/01/1946 Today's Date: 04/14/2013 Time: 4098-1191 PT Time Calculation (min): 23 min  PT Assessment / Plan / Recommendation  History of Present Illness 67 yo female s/p Bilateral L3, L4, L5 laminectomy and facetectomy. PLIF L3-4 L4-5 L5 - S1 Pt with hx of craniotomy and Lt foot droop   PT Comments   Patient seems discouraged by slow progress and continued pain with mobility.  Despite pain medication very painful in back of hips with ambulation.  May need increased time to rehab and unless supportive help at home, may benefit from CIR versus SNF.  Will discuss next session prior to updating recommendations.  Follow Up Recommendations  Home health PT;Supervision/Assistance - 24 hour     Does the patient have the potential to tolerate intense rehabilitation   Potentially  Barriers to Discharge        Equipment Recommendations  None recommended by PT    Recommendations for Other Services    Frequency Min 5X/week   Progress towards PT Goals Progress towards PT goals: Progressing toward goals  Plan Current plan remains appropriate    Precautions / Restrictions Precautions Precautions: Back Precaution Booklet Issued: No Required Braces or Orthoses: Spinal Brace Spinal Brace: Lumbar corset;Applied in sitting position Other Brace/Splint: don AFO shoe left LE   Pertinent Vitals/Pain C/o hip pain with ambulation (unrated)    Mobility  Bed Mobility Rolling Left: 4: Min assist;With rail Left Sidelying to Sit: 4: Min guard;With rails;HOB flat Sit to Sidelying Left: 3: Mod assist Details for Bed Mobility Assistance: pt needed (A) to lift LtLE into bed surface. Pt educated on preventing twisting with UB Transfers Sit to Stand: 4: Min assist;3: Mod assist;With upper extremity assist;From chair/3-in-1;From bed Stand to Sit: 4: Min assist;With upper extremity assist;To chair/3-in-1;To bed Details for  Transfer Assistance: cues for hand placement, demonstrated controlled descent, increased assist from low surface of chair Ambulation/Gait Ambulation/Gait Assistance: 4: Min assist Ambulation Distance (Feet): 35 Feet Assistive device: Rolling walker Ambulation/Gait Assistance Details: pain with each step in back of hips; one episode left knee buckling Gait Pattern: Step-to pattern;Decreased stride length;Decreased weight shift to left;Left steppage     PT Goals (current goals can now be found in the care plan section)    Visit Information  Last PT Received On: 04/14/13 Assistance Needed: +1 History of Present Illness: 67 yo female s/p Bilateral L3, L4, L5 laminectomy and facetectomy. PLIF L3-4 L4-5 L5 - S1 Pt with hx of craniotomy and Lt foot droop    Subjective Data   I know this is good for me, but it is so hard   Cognition  Cognition Arousal/Alertness: Awake/alert Behavior During Therapy: WFL for tasks assessed/performed Overall Cognitive Status: Within Functional Limits for tasks assessed    Balance     End of Session PT - End of Session Equipment Utilized During Treatment: Gait belt;Back brace Activity Tolerance: Patient limited by fatigue;Patient limited by pain Patient left: in bed;with call bell/phone within reach;with family/visitor present   GP     [facility] 04/14/2013, 5:40 PM Sheran Lawless, PT [phone] 04/14/2013

## 2013-04-14 NOTE — Progress Notes (Signed)
Patient ID: [name], female   DOB: November 09, 1945, 67 y.o.   MRN: [id number] Pain under better control. Continue pt/ot

## 2013-04-15 MED ORDER — MORPHINE SULFATE 2 MG/ML IJ SOLN
2.0000 mg | INTRAMUSCULAR | Status: DC | PRN
  Administered 2013-04-15 – 2013-04-16 (×2): 4 mg via INTRAVENOUS
  Administered 2013-04-16 (×3): 2 mg via INTRAVENOUS
  Filled 2013-04-15 (×5): qty 1
  Filled 2013-04-15: qty 2

## 2013-04-15 MED ORDER — KETOROLAC TROMETHAMINE 30 MG/ML IJ SOLN
30.0000 mg | Freq: Four times a day (QID) | INTRAMUSCULAR | Status: AC
  Administered 2013-04-15 – 2013-04-17 (×9): 30 mg via INTRAVENOUS
  Filled 2013-04-15 (×11): qty 1

## 2013-04-15 MED ORDER — MORPHINE SULFATE 4 MG/ML IJ SOLN
INTRAMUSCULAR | Status: AC
  Administered 2013-04-15: 4 mg
  Filled 2013-04-15: qty 1

## 2013-04-15 NOTE — Progress Notes (Signed)
Physical Therapy Treatment Patient Details Name: [name] MRN: [id number] DOB: 05-Dec-1945 Today's Date: 04/15/2013 Time: 4098-1191 PT Time Calculation (min): 42 min  PT Assessment / Plan / Recommendation  History of Present Illness 67 yo female Underwent Bilateral L3, L4, L5 laminectomy and facetectomy. PLIF L3-4 L4-5 L5 - S1 Pt with hx of craniotomy and Lt foot droop   PT Comments   Lengthy discussion with pt re: d/c plans. She may have her daughter come stay with her (when her husband is traveling) or go stay with her husband's aunt. Either way, she is sure she can have 24 hr assist if husband must go out of town. Currently her LLE strength has diminished compared to prior to surgery and she is having more difficulty placing her LLE and maintaining knee extension. Will continue to follow and consider increased level of care for additional rehab if progress remains slow. Pt is open to this idea, however hopes to go directly home.    Follow Up Recommendations  Home health PT;Supervision/Assistance - 24 hour     Does the patient have the potential to tolerate intense rehabilitation     Barriers to Discharge        Equipment Recommendations  None recommended by PT    Recommendations for Other Services    Frequency Min 5X/week   Progress towards PT Goals Progress towards PT goals: Progressing toward goals  Plan Current plan remains appropriate    Precautions / Restrictions Precautions Precautions: Back Precaution Comments: lethargic with difficulty remembering; able to recall 3/3 with cues Required Braces or Orthoses: Spinal Brace Spinal Brace: Lumbar corset;Applied in sitting position Other Brace/Splint: don AFO shoe left LE   Pertinent Vitals/Pain Reports pain in her lower back, buttocks, and posterior/upper thighs; pt reports she doesn't like to use the stronger pain medicine because it makes her too "loopy"    Mobility  Bed Mobility Bed Mobility: Rolling Left;Left  Sidelying to Sit;Sitting - Scoot to Edge of Bed Rolling Left: 4: Min guard;With rail Left Sidelying to Sit: 4: Min assist;With rails Sitting - Scoot to Edge of Bed: 5: Supervision Details for Bed Mobility Assistance: required use of rail Transfers Transfers: Sit to Stand;Stand to Sit Sit to Stand: 4: Min assist;With upper extremity assist Stand to Sit: 4: Min assist;With upper extremity assist Details for Transfer Assistance: observed PT (A) required for sit<>Stand Ambulation/Gait Ambulation/Gait Assistance: 3: Mod assist;4: Min assist Ambulation Distance (Feet): 55 Feet Assistive device: Rolling walker;Other (Comment) (Lt AFO; shoes) Ambulation/Gait Assistance Details: vc for safe use of RW (pt with tendency to push it too far ahead); vc for placing Lt foot flat (she tends to take a short step and only place ball of foot down, knee then less stable and near buckles) Gait Pattern: Step-to pattern;Decreased stride length;Decreased dorsiflexion - left;Left flexed knee in stance;Antalgic;Wide base of support Gait velocity: decreased    Exercises     PT Diagnosis:    PT Problem List:   PT Treatment Interventions:     PT Goals (current goals can now be found in the care plan section) Acute Rehab PT Goals Patient Stated Goal: to return hom to dogs  Visit Information  Last PT Received On: 04/15/13 Assistance Needed: +1 History of Present Illness: 67 yo female Underwent Bilateral L3, L4, L5 laminectomy and facetectomy. PLIF L3-4 L4-5 L5 - S1 Pt with hx of craniotomy and Lt foot droop    Subjective Data  Subjective: Reports her LLE is weaker now than prior to  surgery Patient Stated Goal: to return hom to dogs   Cognition  Cognition Arousal/Alertness: Awake/alert Behavior During Therapy: WFL for tasks assessed/performed Overall Cognitive Status: Within Functional Limits for tasks assessed    Balance     End of Session PT - End of Session Equipment Utilized During Treatment: Gait  belt;Back brace Activity Tolerance: Patient limited by pain;Patient limited by fatigue Patient left: in chair;with call bell/phone within reach Nurse Communication: Mobility status   GP     [name] 04/15/2013, 3:40 PM Pager [phone]

## 2013-04-15 NOTE — Progress Notes (Signed)
Pt c/o of severe pain,had tab oxycodone IR 15mg  at 0351,next due at 0800,Dr Jones(on call)paged and notified,ordered iv morphine 2-4mg  PRN for pain,same commenced at 0630,pt reassured,will continue to monitor. [name], [name]

## 2013-04-15 NOTE — Progress Notes (Addendum)
Occupational Therapy Treatment Patient Details Name: [name] MRN: [id number] DOB: 30-Nov-1945 Today's Date: 04/15/2013 Time: 8413-2440 OT Time Calculation (min): 24 min  OT Assessment / Plan / Recommendation  History of present illness 67 yo female s/p Bilateral L3, L4, L5 laminectomy and facetectomy. PLIF L3-4 L4-5 L5 - S1 Pt with hx of craniotomy and Lt foot droop    OT comments  Pt demonstrates incr use of AE for don of AFO however remains requiring (A). Pt is unable to complete LB dressing without (A) and breaking precautions. Pt plans to have spouse and extended family (A) upon dc. Pt is unable to complete sit<>stand without physical (A). Pt may need continued therapy prior to return home. Ot to continue to follow.  Follow Up Recommendations  Home health OT    Barriers to Discharge       Equipment Recommendations  None recommended by OT    Recommendations for Other Services    Frequency Min 2X/week   Progress towards OT Goals Progress towards OT goals: Progressing toward goals  Plan Discharge plan remains appropriate    Precautions / Restrictions Precautions Precautions: Back Required Braces or Orthoses: Spinal Brace Spinal Brace: Lumbar corset;Applied in sitting position Other Brace/Splint: don AFO shoe left LE   Pertinent Vitals/Pain Reports fatigue    ADL  Lower Body Dressing: Minimal assistance Where Assessed - Lower Body Dressing: Unsupported sitting (use of AE required. see adl section) Equipment Used: Gait belt;Back brace;Rolling walker;Other (comment) (AFO shoe) Transfers/Ambulation Related to ADLs: Pt needing assistance for sit <>Stand position ADL Comments: session focused on donning shoes. Pt provided tennis shoes, AE (reacher sock aide, shoe horn. Pt currently has no show footie socks which has made sock aid use difficult. pt only able to don first half of sock on sock aid due to size of sock. Pt pulling sock aid and sock coming off mid foot. Pt using  reacher to don right sock fully. Pt needed min (A) with right sock. Pt could have an improved level of independence with a longer sock. Pt with much improved use of shoe horn to keep shoe open. Pt able to place left foot into shoe and get shoe on left foot. Pt needed (A) with tying bil shoes and recommend elastic shoe laces. Pt needed total (A) to strap AFO . Pt is unable to use reacher to pull straps onto AFO.     OT Diagnosis:    OT Problem List:   OT Treatment Interventions:     OT Goals(current goals can now be found in the care plan section) Acute Rehab OT Goals Patient Stated Goal: to return hom to dogs OT Goal Formulation: With patient/family Time For Goal Achievement: 04/27/13 Potential to Achieve Goals: Good ADL Goals Pt Will Perform Grooming: with supervision;standing Pt Will Perform Lower Body Bathing: with supervision;sit to/from stand Pt Will Perform Lower Body Dressing: with supervision;sit to/from stand Pt Will Transfer to Toilet: with supervision;bedside commode Additional ADL Goal #1: Pt will don/ doff  brace MOD I  Visit Information  Last OT Received On: 04/15/13 Assistance Needed: +1 History of Present Illness: 67 yo female s/p fall with T12 & L1 compression fxs. Underwent Bilateral L3, L4, L5 laminectomy and facetectomy. PLIF L3-4 L4-5 L5 - S1 Pt with hx of craniotomy and Lt foot droop    Subjective Data      Prior Functioning  Home Living Family/patient expects to be discharged to:: Private residence Living Arrangements: Spouse/significant other Available Help at Discharge: Available  PRN/intermittently Type of Home: House Home Access: Stairs to enter [employer] of Steps: 3 Entrance Stairs-Rails: None Home Layout: One level;Able to live on main level with bedroom/bathroom    Cognition  Cognition Arousal/Alertness: Awake/alert Behavior During Therapy: WFL for tasks assessed/performed Overall Cognitive Status: Within Functional Limits for tasks  assessed    Mobility  Bed Mobility Rolling Left: 4: Min guard;With rail Left Sidelying to Sit: 4: Min assist;With rails Details for Bed Mobility Assistance: required use of rail Transfers Details for Transfer Assistance: observed PT (A) required for sit<>Stand    Exercises      Balance     End of Session OT - End of Session Activity Tolerance: Patient tolerated treatment well Patient left: Other (comment);in bed (with PT Larita Fife) Nurse Communication: Mobility status;Precautions  GO     Harolyn Rutherford 04/15/2013, 3:32 PM Pager: [phone]

## 2013-04-15 NOTE — Progress Notes (Signed)
Filed Vitals:   04/14/13 1700 04/14/13 2200 04/15/13 0127 04/15/13 0534  BP: 131/68 134/52 145/66 112/89  Pulse:  82 84 80  Temp:  98.8 F (37.1 C) 98.5 F (36.9 C) 98.4 F (36.9 C)  TempSrc:  Oral Oral Oral  Resp:  18 18 18   Height:      Weight:      SpO2: 97% 94% 93% 98%    The patient complaining about pain in the buttocks extending to the posterior thighs. Has weakness in the lower extremities, that limits activity. Started on IV morphine, as well as Percocet and OxyIR by mouth. Also taking muscle relaxants. Working with PT and OT, progress limited so far. Dressing dry and intact.  Plan: Continued to progress through postoperative recovery. Continue PT and OT. Add Toradol 30 mg IV every 6 hours for 2 days. Spoke with patient and her husband about plans for care.  Hewitt Shorts, MD 04/15/2013, 7:51 AM

## 2013-04-16 NOTE — Progress Notes (Signed)
Patient ID: [name], female   DOB: 1946-07-08, 67 y.o.   MRN: [id number] She still has considerable pain in her hips and legs. She is somewhat better after treatment with Decadron. She still has weakness in her legs left greater than right. Some of it is chronic but she feels like it is worse after surgery. Her incision looks okay. She has significant foot drop on the left but she states this is old. Continue therapy. I think she probably need rehabilitation.

## 2013-04-16 NOTE — Plan of Care (Signed)
Problem: Acute Rehab PT Goals(only PT should resolve) Goal: Pt Will Ambulate Outcome: Not Progressing Patient with increased pain, limiting mobility today

## 2013-04-16 NOTE — Progress Notes (Signed)
Physical Therapy Treatment Patient Details Name: [name] MRN: [id number] DOB: May 10, 1946 Today's Date: 04/16/2013 Time: 4098-1191 PT Time Calculation (min): 23 min  PT Assessment / Plan / Recommendation  History of Present Illness 67 yo female Underwent Bilateral L3, L4, L5 laminectomy and facetectomy. PLIF L3-4 L4-5 L5 - S1 Pt with hx of craniotomy and Lt foot droop   PT Comments   Patient having more difficulty with mobility today due to increased pain and weakness.  Do not feel patient will be able to discharge home at this point.  Recommend Inpatient Rehab consult for more intense therapies prior to returning home.  Follow Up Recommendations  CIR     Does the patient have the potential to tolerate intense rehabilitation     Barriers to Discharge        Equipment Recommendations  None recommended by PT    Recommendations for Other Services Rehab consult  Frequency Min 5X/week   Progress towards PT Goals Progress towards PT goals: Not progressing toward goals - comment (Patient with increased weakness/pain limiting mobility)  Plan Discharge plan needs to be updated    Precautions / Restrictions Precautions Precautions: Back Required Braces or Orthoses: Spinal Brace Spinal Brace: Lumbar corset;Applied in sitting position Other Brace/Splint: AFO Lt foot Restrictions Weight Bearing Restrictions: No   Pertinent Vitals/Pain Increased pain with mobility today.    Mobility  Bed Mobility Bed Mobility: Rolling Left;Left Sidelying to Sit Rolling Left: 4: Min guard;With rail Left Sidelying to Sit: 4: Min assist;HOB elevated;With rails Details for Bed Mobility Assistance: Verbal cues for technique.  Assist to raise trunk to sitting position. Transfers Transfers: Sit to Stand;Stand to Sit Sit to Stand: 3: Mod assist;With upper extremity assist;From bed Stand to Sit: 4: Min assist;With upper extremity assist;With armrests;To bed Details for Transfer Assistance: Verbal cues  for hand placement.  Patient with difficulty coming to standing today, she reports due to back pain. Ambulation/Gait Ambulation/Gait Assistance: 3: Mod assist (+1 for chair) Ambulation Distance (Feet): 6 Feet Assistive device: Rolling walker Ambulation/Gait Assistance Details: Patient with flexed posture.  Difficulty getting rt foot flat during stance.  Lt knee buckling during gait.   Gait Pattern: Step-to pattern;Decreased stride length;Decreased dorsiflexion - left;Left flexed knee in stance;Antalgic;Wide base of support Gait velocity: decreased General Gait Details: Distance limited today due to pain, and weakness LLE.      PT Goals (current goals can now be found in the care plan section)    Visit Information  Last PT Received On: 04/16/13 Assistance Needed: +1 History of Present Illness: 67 yo female Underwent Bilateral L3, L4, L5 laminectomy and facetectomy. PLIF L3-4 L4-5 L5 - S1 Pt with hx of craniotomy and Lt foot droop    Subjective Data  Subjective: Patient c/o back pain.  Requiring encouragement to participate.   Cognition  Cognition Arousal/Alertness: Awake/alert Behavior During Therapy: WFL for tasks assessed/performed Overall Cognitive Status: Within Functional Limits for tasks assessed    Balance     End of Session PT - End of Session Equipment Utilized During Treatment: Gait belt;Back brace (Lt LE AFO) Activity Tolerance: Patient limited by pain;Patient limited by fatigue Patient left: in chair;with call bell/phone within reach;with family/visitor present Nurse Communication: Mobility status;Patient requests pain meds   GP     Vena Austria 04/16/2013, 3:49 PM Durenda Hurt. Renaldo Fiddler, [facility] Acute Rehab Services Pager [phone]

## 2013-04-17 ENCOUNTER — Inpatient Hospital Stay (HOSPITAL_COMMUNITY): Payer: BC Managed Care – PPO

## 2013-04-17 DIAGNOSIS — M47817 Spondylosis without myelopathy or radiculopathy, lumbosacral region: Secondary | ICD-10-CM

## 2013-04-17 DIAGNOSIS — IMO0002 Reserved for concepts with insufficient information to code with codable children: Secondary | ICD-10-CM

## 2013-04-17 LAB — URINE MICROSCOPIC-ADD ON

## 2013-04-17 LAB — URINALYSIS, ROUTINE W REFLEX MICROSCOPIC
Bilirubin Urine: NEGATIVE
Glucose, UA: NEGATIVE mg/dL
Hgb urine dipstick: NEGATIVE
Ketones, ur: NEGATIVE mg/dL
pH: 7.5 (ref 5.0–8.0)

## 2013-04-17 MED ORDER — HYDROMORPHONE HCL 2 MG PO TABS: 4.0000 mg | ORAL_TABLET | ORAL | Status: DC | PRN

## 2013-04-17 MED ORDER — HYDROMORPHONE HCL 2 MG PO TABS
2.0000 mg | ORAL_TABLET | ORAL | Status: DC | PRN
  Administered 2013-04-17 – 2013-04-19 (×4): 2 mg via ORAL
  Filled 2013-04-17 (×4): qty 1

## 2013-04-17 MED ORDER — CYCLOBENZAPRINE HCL 10 MG PO TABS
10.0000 mg | ORAL_TABLET | Freq: Three times a day (TID) | ORAL | Status: DC | PRN
  Administered 2013-04-17 – 2013-04-18 (×2): 10 mg via ORAL
  Filled 2013-04-17 (×2): qty 1

## 2013-04-17 MED ORDER — DEXAMETHASONE 4 MG PO TABS
4.0000 mg | ORAL_TABLET | Freq: Four times a day (QID) | ORAL | Status: DC
  Administered 2013-04-17 – 2013-04-19 (×9): 4 mg via ORAL
  Filled 2013-04-17 (×12): qty 1

## 2013-04-17 NOTE — Progress Notes (Signed)
Pt.'s daughter on the unit and notified of suicide precautions in place, as she has been sitting with the patient the majority of the day. Per pt.'s daughter she has noticed the pt. talking to people that are currently deceased and also people currently living. Pt.'s daughter verbalized understanding of precautions and is aware that a staff member must be with the patient at all times.

## 2013-04-17 NOTE — Progress Notes (Signed)
Upon this RN's assessment of pt.; pt.'s husband stated that he was "frustrated and angry with wife's current condition" and inability to bear weight on the LLE. Also, noted per pt. has been having to void multiple times per hour; c/o urgency as well. Bladder Scan obtained showing 571 cc. Pt. In and out cathed for 600 cc clear yellow urine. Dr. Jeral Fruit paged and notified of husband's concerns and that pt. Is retaining urine; new orders received. Will monitor.

## 2013-04-17 NOTE — Consult Note (Signed)
Physical Medicine and Rehabilitation Consult Reason for Consult:weakness,  Referring Physician: Newell Coral   HPI: [name] is a 67 y.o. 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 Admitted 04/12/2013 with progressive low back pain radiating to her extremities. X-rays and imaging revealed degenerative disc disease as well as lumbar L3-4 central disc protrusion with stenosis/radiculopathy. Underwent bilateral L3, 4, 5 laminectomy facetectomy was interbody fusion with pedicle screws 04/13/2013 per Dr. Jeral Fruit. Postoperative pain management. Back brace when out of bed. Physical and occupational therapy evaluations pending. M.D. is requested physical medicine rehabilitation consult to consider inpatient rehabilitation services after she failed to progress with therapies.    Left LE weakness, back pain A 12 point review of systems has been performed and if not noted above is otherwise negative.   Past Medical History  Diagnosis Date  . Benign neoplasm of colon   . Depressive disorder, not elsewhere classified   . Anxiety state, unspecified   . Other and unspecified hyperlipidemia   . Esophageal reflux   . Obesity   . Hypothyroidism   . Hemorrhoids   . Colon polyps     hyperplastic  . Family history of colon cancer mother  . Lymphocytic colitis   . Contact lens/glasses fitting     wears glassses or contacts  . Complications affecting other specified body systems, hypertension     No cardiologist   . Seizures     x 1  . Brain tumor    Past Surgical History  Procedure Laterality Date  . Craniotomy    . Back surgery    . Abdominal hysterectomy    . Appendectomy    . Carpal tunnel release      right  . Brain surgery  1999    benign tumor  . Hammertoe reconstruction with weil osteotomy  07/07/2012    Procedure:  HAMMERTOE RECONSTRUCTION WITH WEIL OSTEOTOMY;  Surgeon: Toni Arthurs, MD;  Location: [facility];  Service: Orthopedics;  Laterality: Left;  Left Second through Fourth Hammertoe Correction; Left Fifth Toe Flexor Tendon Release, and Left Second through Fourth Weil Osteotomy   Family History  Problem Relation Age of Onset  . Breast cancer Mother   . Colon cancer Mother   . Lung cancer Maternal Uncle   . Bone cancer Maternal Grandmother   . Heart attack Father    Social History:  reports that she quit smoking about 2 years ago. She has never used smokeless tobacco. She reports that she drinks about 3.5 ounces of alcohol per week. She reports that she does not use illicit drugs. Allergies:  Allergies  Allergen Reactions  . Codeine Other (See Comments)    nightmares  . Hydrocodone-Acetaminophen Itching    Has to take benadryl with to tolerate  . Hydrocodone Itching    Has to take benadryl to tolerate med  . Sulfonamide Derivatives Rash   Medications Prior to Admission  Medication Sig Dispense Refill  . Ascorbic Acid (VITAMIN C) 1000 MG tablet Take 1,000 mg by mouth daily.        Marland Kitchen aspirin 81 MG tablet Take 81 mg by mouth 2 (two) times daily.       Marland Kitchen atorvastatin (LIPITOR) 40 MG tablet Take 1 tablets by mouth once daily.      . budesonide (ENTOCORT EC) 3 MG 24 hr capsule Take 1 capsule (  3 mg total) by mouth 3 (three) times daily.  270 capsule  1  . buPROPion (WELLBUTRIN SR) 150 MG 12 hr tablet Take 150 mg by mouth daily.       . Calcium Carbonate-Vitamin D 600-400 MG-UNIT per tablet Take 1 tablet by mouth 2 (two) times daily.        . cetirizine (ZYRTEC) 10 MG tablet Take 10 mg by mouth daily. "aller-tec"      . esomeprazole (NEXIUM) 40 MG capsule Take 40 mg by mouth daily before breakfast.        . fluticasone (FLONASE) 50 MCG/ACT nasal spray Place 2 sprays into the nose daily.      . hydrochlorothiazide 25 MG tablet Take 25 mg by mouth daily.       Marland Kitchen  HYDROcodone-acetaminophen (NORCO) 10-325 MG per tablet Take 1 tablet by mouth every 6 (six) hours as needed for pain.      . hydrocortisone (ANUSOL-HC) 25 MG suppository PLACE 1 SUPPOSITORY (25 MG TOTAL) RECTALLY AT BEDTIME AS NEEDED.  100 suppository  0  . hydrocortisone acetate 0.5 % cream Apply 1 application topically daily as needed (for hemorrhoids).      Marland Kitchen levothyroxine (SYNTHROID, LEVOTHROID) 88 MCG tablet Take 88 mcg by mouth daily.      . Melatonin 5 MG CAPS Take 10 mg by mouth daily.       . nabumetone (RELAFEN) 500 MG tablet Take 500 mg by mouth 2 (two) times daily.       . Omega-3 Fatty Acids (FISH OIL) 1000 MG CAPS Take 1 capsule by mouth 2 (two) times daily.       . sertraline (ZOLOFT) 50 MG tablet Take 50 mg by mouth daily.      . traMADol (ULTRAM) 50 MG tablet Take 50 mg by mouth every 8 (eight) hours as needed.      . Budesonide 9 MG TB24 Take 3 mg by mouth 2 (two) times daily.  540 tablet  1    Home: Home Living Family/patient expects to be discharged to:: Private residence Living Arrangements: Spouse/significant other Available Help at Discharge: Available PRN/intermittently Type of Home: House Home Access: Stairs to enter [employer] of Steps: 3 Entrance Stairs-Rails: None Home Layout: One level;Able to live on main level with bedroom/bathroom Home Equipment: Shower seat - built in;Hand held shower head;Grab bars - tub/shower;Grab bars - toilet;Cane - single point;Walker - 2 wheels;Crutches  Functional History:   Functional Status:  Mobility: Bed Mobility Bed Mobility: Rolling Left;Left Sidelying to Sit Rolling Left: 4: Min guard;With rail Left Sidelying to Sit: 4: Min assist;HOB elevated;With rails Supine to Sit: 4: Min assist;With rails;HOB elevated Sitting - Scoot to Edge of Bed: 5: Supervision Sit to Supine: 3: Mod assist;HOB flat Sit to Sidelying Left: 3: Mod assist Transfers Transfers: Sit to Stand;Stand to Sit Sit to Stand: 3: Mod assist;With  upper extremity assist;From bed Stand to Sit: 4: Min assist;With upper extremity assist;With armrests;To bed Ambulation/Gait Ambulation/Gait Assistance: 3: Mod assist (+1 for chair) Ambulation Distance (Feet): 6 Feet Assistive device: Rolling walker Ambulation/Gait Assistance Details: Patient with flexed posture.  Difficulty getting rt foot flat during stance.  Lt knee buckling during gait.   Gait Pattern: Step-to pattern;Decreased stride length;Decreased dorsiflexion - left;Left flexed knee in stance;Antalgic;Wide base of support Gait velocity: decreased General Gait Details: Distance limited today due to pain, and weakness LLE. Stairs: No    ADL: ADL Eating/Feeding: Set up Where Assessed - Eating/Feeding: Chair Grooming: Teeth care;Minimal assistance  Where Assessed - Grooming: Supported standing (pt unable to maintain static standing on LT LE) Lower Body Dressing: Minimal assistance Where Assessed - Lower Body Dressing: Unsupported sitting (use of AE required. see adl section) Toilet Transfer: Moderate assistance Toilet Transfer Method: Sit to stand Toilet Transfer Equipment: Raised toilet seat with arms (or 3-in-1 over toilet) Equipment Used: Gait belt;Back brace;Rolling walker;Other (comment) (AFO shoe) Transfers/Ambulation Related to ADLs: Pt needing assistance for sit <>Stand position ADL Comments: session focused on donning shoes. Pt provided tennis shoes, AE (reacher sock aide, shoe horn. Pt currently has no show footie socks which has made sock aid use difficult. pt only able to don first half of sock on sock aid due to size of sock. Pt pulling sock aid and sock coming off mid foot. Pt using reacher to don right sock fully. Pt needed min (A) with right sock. Pt could have an improved level of independence with a longer sock. Pt with much improved use of shoe horn to keep shoe open. Pt able to place left foot into shoe and get shoe on left foot. Pt needed (A) with tying bil shoes  and recommend elastic shoe laces. Pt needed total (A) to strap AFO . Pt is unable to use reacher to pull straps onto AFO.   Cognition: Cognition Overall Cognitive Status: Within Functional Limits for tasks assessed Orientation Level: Oriented X4 Cognition Arousal/Alertness: Awake/alert Behavior During Therapy: WFL for tasks assessed/performed Overall Cognitive Status: Within Functional Limits for tasks assessed  Blood pressure 145/68, pulse 75, temperature 97.6 F (36.4 C), temperature source Axillary, resp. rate 18, height 5\' 5"  (1.651 m), weight 87.5 kg (192 lb 14.4 oz), SpO2 97.00%.   General: Alert and oriented x 3, No apparent distress HEENT: Head is normocephalic, atraumatic, PERRLA, EOMI, sclera anicteric, oral mucosa pink and moist, dentition intact, ext ear canals clear,  Neck: Supple without JVD or lymphadenopathy Heart: Reg rate and rhythm. No murmurs rubs or gallops Chest: CTA bilaterally without wheezes, rales, or rhonchi; no distress Abdomen: Soft, non-tender, non-distended, bowel sounds positive. Extremities: No clubbing, cyanosis, or edema. Pulses are 2+ Skin: Clean and intact without signs of breakdown Neuro: Mild LLE weakness, heel cord contracture, mild left deltoid weakness, some pain inhibition.  Musculoskeletal: back expectedly tender.  Psych: Pt's affect is appropriate. Pt is cooperative   No results found for this or any previous visit (from the past 24 hour(s)). No results found.  Assessment/Plan: Diagnosis: lumbar DDD with stenosis and radiculopathy s/p L3-5 fusion, lami, prior brain tumor resection 1. Does the need for close, 24 hr/day medical supervision in concert with the patient's rehab needs make it unreasonable for this patient to be served in a less intensive setting? Yes 2. Co-Morbidities requiring supervision/potential complications: obesity, significant pain 3. Due to bladder management, bowel management, safety, skin/wound care, disease  management, medication administration, pain management and patient education, does the patient require 24 hr/day rehab nursing? Yes 4. Does the patient require coordinated care of a physician, rehab nurse, PT (1-2 hrs/day, 5 days/week) and OT (1-2 hrs/day, 5 days/week) to address physical and functional deficits in the context of the above medical diagnosis(es)? Yes Addressing deficits in the following areas: balance, endurance, locomotion, strength, transferring, bowel/bladder control, bathing, dressing, feeding, grooming and toileting 5. Can the patient actively participate in an intensive therapy program of at least 3 hrs of therapy per day at least 5 days per week? Yes 6. The potential for patient to make measurable gains while on inpatient rehab is  good 7. Anticipated functional outcomes upon discharge from inpatient rehab are supervision with PT, supervision to minimal assist with OT, n/a with SLP. 8. Estimated rehab length of stay to reach the above functional goals is: 2 weeks 9. Does the patient have adequate social supports to accommodate these discharge functional goals? Yes 10. Anticipated D/C setting: Home 11. Anticipated post D/C treatments: HH therapy 12. Overall Rehab/Functional Prognosis: good  RECOMMENDATIONS: This patient's condition is appropriate for continued rehabilitative care in the following setting: CIR Patient has agreed to participate in recommended program. Yes Note that insurance prior authorization may be required for reimbursement for recommended care.  Comment: Rehab RN to follow up. The pt's pain has been a major factor and has inhibited her expected progress. She's also had some issues related to the tolerance of pain meds as well residual weakness from her brain tumore/resection   Ranelle Oyster, MD, [place] [facility]     04/17/2013

## 2013-04-17 NOTE — Progress Notes (Signed)
Occupational Therapy Treatment Patient Details Name: [name] MRN: [id number] DOB: [birth year] Today's Date: 04/17/2013 Time: 1314-1400 OT Time Calculation (min): 46 min  OT Assessment / Plan / Recommendation  History of present illness 67 yo female Underwent Bilateral L3, L4, L5 laminectomy and facetectomy. PLIF L3-4 L4-5 L5 - S1 Pt with hx of craniotomy and Lt foot droop   OT comments  Recommending CIR due to poor progression toward goals with incr LT LE weakness. Pt currently does not tolerate static standing on LT LE. Pt with cognitive deficits questions due to medications. Pt is unsafe to d/c home at this time. Family agreeable to CIR consult.  Follow Up Recommendations  CIR    Barriers to Discharge       Equipment Recommendations  Other (comment) (Defer to CIR)    Recommendations for Other Services Rehab consult  Frequency Min 2X/week   Progress towards OT Goals Progress towards OT goals: Not progressing toward goals - comment  Plan Discharge plan needs to be updated    Precautions / Restrictions Precautions Precautions: Back Required Braces or Orthoses: Spinal Brace Spinal Brace: Lumbar corset;Applied in sitting position Other Brace/Splint: AFO Lt foot   Pertinent Vitals/Pain 8 out 10 left hip pain    ADL  Lower Body Bathing: +2 Total assistance Lower Body Bathing: Patient Percentage: 0% Where Assessed - Lower Body Bathing: Supported sit to stand (total (A) for peri - two person assist to static stand) Toilet Transfer: Maximal assistance Toilet Transfer Method: Stand pivot Toilet Transfer Equipment: Raised toilet seat with arms (or 3-in-1 over toilet) Toileting - Clothing Manipulation and Hygiene: +2 Total assistance Toileting - Clothing Manipulation and Hygiene: Patient Percentage: 0% Where Assessed - Toileting Clothing Manipulation and Hygiene: Sit to stand from 3-in-1 or toilet Tub/Shower Transfer: Maximal assistance (chair to 3n1 in shower no  threshold) Tub/Shower Transfer Method: Stand pivot Equipment Used: Gait belt;Back brace;Rolling walker (AFO not don for pending shower) Transfers/Ambulation Related to ADLs: Pt completed sit<>Stand with posterior lean. pt started crying standing "I can't" Pt impulsive and attempting to return to sitting. Pt transferred sit<>stand  stand pivot right to chair. pt pushed in chair to bathroom. Pt describes shoot paining and will have a sudden pushing posteriorly (retropulsion). Pt transfered chair<>3n1. Pt required 3n1<>chair<>bed to return to supine ADL Comments: Pt supine in bed on arrival. Family descibes patient has having hallunications and seeing deceased parents at night. Pt does not report to OT any cognitive changes at this time. Pt describes pain supine. pt agreeable to OOB> pt very impulsive and decr ability to complete bed transfer. pt with decr sequence for bed mobility compared to previous session. pt sit<>stand with only TWD weight bearing and progressively pulling LT LE into NWB status. pt unable to complete any transfer with weight bearing. pt static standing on RT LE only. Pt holding LT LE in a hip extension, knee flexion, posteriorly tucked position. Pt encouraged to demonstrate knee extension pt tearful and states "I CAN"T" Pt describes shooting pain at the hip. RN Victorino Dike arriving and providing muscle relaxer at this time. Pt while on 3n1 for possible shower voiding bladder. pt stating "i can't take a shower put me back in the bed" Pt then voiding bladder to due poor recall of no bucket with pending shower. pt demonstrates sustained attention level and poor recall of short term memory. Pt with cognitive deficits present. Pt positioned in supine at end of session due  unable to tolerate upright posture.    OT  Diagnosis:    OT Problem List:   OT Treatment Interventions:     OT Goals(current goals can now be found in the care plan section) Acute Rehab OT Goals Patient Stated Goal: to  return hom to dogs OT Goal Formulation: With patient/family Time For Goal Achievement: 04/27/13 Potential to Achieve Goals: Good ADL Goals Pt Will Perform Grooming: with supervision;standing Pt Will Perform Lower Body Bathing: with supervision;sit to/from stand Pt Will Perform Lower Body Dressing: with supervision;sit to/from stand Pt Will Transfer to Toilet: with supervision;bedside commode Additional ADL Goal #1: Pt will don/ doff  brace MOD I  Visit Information  Last OT Received On: 04/17/13 Assistance Needed: +2 (Lt LE buckling- need anything more than stand pivot) History of Present Illness: 67 yo female Underwent Bilateral L3, L4, L5 laminectomy and facetectomy. PLIF L3-4 L4-5 L5 - S1 Pt with hx of craniotomy and Lt foot droop    Subjective Data      Prior Functioning       Cognition  Cognition Arousal/Alertness: Suspect due to medications;Lethargic Behavior During Therapy: Impulsive Overall Cognitive Status: Impaired/Different from baseline Area of Impairment: Attention;Memory;Safety/judgement;Awareness;Problem solving Current Attention Level: Sustained Memory: Decreased short-term memory Safety/Judgement: Decreased awareness of safety;Decreased awareness of deficits Awareness: Emergent;Anticipatory Problem Solving: Slow processing;Difficulty sequencing    Mobility  Bed Mobility Bed Mobility: Supine to Sit;Rolling Left;Left Sidelying to Sit;Sitting - Scoot to Edge of Bed Rolling Left: 3: Mod assist;With rail Left Sidelying to Sit: 3: Mod assist;HOB elevated;With rails Supine to Sit: 3: Mod assist;With rails;HOB elevated Sitting - Scoot to Edge of Bed: 3: Mod assist;With rail Details for Bed Mobility Assistance: v/c for sequence and (A) for pad to complete progression to EOB. Pt with Lt LE catching on bed rail without awareness Transfers Transfers: Sit to Stand;Stand to Sit Sit to Stand: 2: Max assist;With upper extremity assist;From bed Stand to Sit: 2: Max  assist;With upper extremity assist;To bed Details for Transfer Assistance: vc for hand placement, upright posture, and left LE placement    Exercises      Balance Static Sitting Balance Static Sitting - Balance Support: Bilateral upper extremity supported;Feet supported Static Sitting - Level of Assistance: 5: Stand by assistance;2: Max assist (sudden retroplusion posterior pushing backward) Static Standing Balance Static Standing - Balance Support: Bilateral upper extremity supported;During functional activity Static Standing - Level of Assistance: 2: Max assist   End of Session OT - End of Session Activity Tolerance: Patient limited by pain;Other (comment) (impulsive) Patient left: in bed;with call bell/phone within reach;with family/visitor present (daughter) Nurse Communication: Mobility status;Precautions  GO   Pt attempted this AM and awaiting MD Botero arrival. Family and patient agreeable to CIR consult. Family very concerned with poor progression and mental changes starting Saturday PM hours. Family educated on OT plan for treatment. Family requesting a shower and agreeable to s/p meeting MD Botero this AM. MD Jeral Fruit agreeable to Shower after this visit. Pt transferred to shower and declining shower once in bathroom. Daughter present and educated on patients decline to shower.  Boone Master B 04/17/2013, 3:32 PM Pager: [phone]

## 2013-04-17 NOTE — Progress Notes (Signed)
Rehab Admissions Coordinator Note:  Patient was screened by Brock Ra for appropriateness for an Inpatient Acute Rehab Consult.  At this time, pt already has order for an Inpatient Rehab consult.  Brock Ra 04/17/2013, 8:55 AM  I can be reached at [phone].

## 2013-04-17 NOTE — Progress Notes (Signed)
PT Cancellation Note  Patient Details Name: [name] MRN: [id number] DOB: July 03, 1946   Cancelled Treatment:    Reason Eval/Treat Not Completed: Pain limiting ability to participate;Medical issues which prohibited therapy.  Patient with increased agitation/decreased cognitition today - ?due to pain meds.  OT reported to PT difficulty during OT session. Will hold PT and return in am.   Vena Austria 04/17/2013, 3:45 PM Durenda Hurt. Renaldo Fiddler, [facility] Acute Rehab Services Pager [phone]

## 2013-04-17 NOTE — Progress Notes (Signed)
This RN called to pt.'s room for assistance with using the bedpan. Upon entering pt.'s room; pt. stated "I just want to slit my throat." When this writer addressed this statement with the patient, the pt. stated " I'm so sorry. I have 6 grand-children and I don't want to miss a minute, I would never harm myself. I was just frustrated at everything." Pt. asked to notify staff ASAP if thoughts of harming herself arise. However, pt. remains confused and hallucinatory intermittently. Dr. Jeral Fruit notified; pt. to be placed on Suicide precautions with sitter at bedside. Dr. Jeral Fruit to call Psych Consult. CN and AC made aware.

## 2013-04-17 NOTE — Progress Notes (Signed)
I met with pt and her daughter at bedside. Discussed an inpt rehab admission pending insurance approval. I will begin approval process today. [phone]

## 2013-04-17 NOTE — Progress Notes (Signed)
Patient ID: [name], female   DOB: 12-06-1945, 67 y.o.   MRN: [id number] Crying because of sciatic pain, no weakness in right foot. Left foot weakness is old. Wound dry. Seen by rehabilitation medicine.encouraged to be oob and take a shower

## 2013-04-18 DIAGNOSIS — F339 Major depressive disorder, recurrent, unspecified: Secondary | ICD-10-CM

## 2013-04-18 MED ORDER — BUPROPION HCL ER (SR) 100 MG PO TB12
200.0000 mg | ORAL_TABLET | Freq: Every morning | ORAL | Status: DC
  Administered 2013-04-19: 200 mg via ORAL
  Filled 2013-04-18: qty 2

## 2013-04-18 MED ORDER — FLEET ENEMA 7-19 GM/118ML RE ENEM
1.0000 | ENEMA | Freq: Every day | RECTAL | Status: DC | PRN
  Administered 2013-04-18: 1 via RECTAL
  Filled 2013-04-18: qty 1

## 2013-04-18 NOTE — Progress Notes (Signed)
Physical Therapy Treatment Patient Details Name: [name] MRN: [id number] DOB: [birth year] Today's Date: 04/18/2013 Time: 1478-2956 PT Time Calculation (min): 30 min  PT Assessment / Plan / Recommendation  History of Present Illness 67 yo female Underwent Bilateral L3, L4, L5 laminectomy and facetectomy. PLIF L3-4 L4-5 L5 - S1 Pt with hx of craniotomy and Lt foot droop   PT Comments   Pt cognitively much better today. Had pain medicine ~one hour prior to session, however still having 8/10 pain limiting her mobility. Pt very motivated; has had slow progress due to pain, incr LLE weakness post-op, and recent confusion (likely due to pain medicine?).    Follow Up Recommendations  CIR     Does the patient have the potential to tolerate intense rehabilitation     Barriers to Discharge        Equipment Recommendations  None recommended by PT    Recommendations for Other Services    Frequency Min 5X/week   Progress towards PT Goals Progress towards PT goals: Progressing toward goals  Plan Current plan remains appropriate    Precautions / Restrictions Precautions Precautions: Back Precaution Comments: able to state 1/3 precautions; re-instructed Required Braces or Orthoses: Spinal Brace Spinal Brace: Lumbar corset;Applied in sitting position Other Brace/Splint: AFO Lt foot   Pertinent Vitals/Pain 8/10 back/buttocks during mobility; 6/10 at rest in chair    Mobility  Bed Mobility Bed Mobility: Rolling Left;Left Sidelying to Sit;Sitting - Scoot to [place] of Bed Rolling Left: 4: Min assist;With rail Left Sidelying to Sit: 4: Min assist;With rails;HOB flat Sitting - Scoot to [place] of Bed: 4: Min guard Details for Bed Mobility Assistance: vc for technique/sequencing to maintain back precautions Transfers Transfers: Sit to Stand;Stand to Sit;Stand Pivot Transfers Sit to Stand: 1: +2 Total assist;With upper extremity assist Sit to Stand: Patient Percentage: 60% Stand to Sit: 4: Min  assist;With upper extremity assist Stand Pivot Transfers: 1: +2 Total assist Stand Pivot Transfers: Patient Percentage: 70% Details for Transfer Assistance: bed to [facility] without AFO due to urgency with pt able to bear weight through LLE; prior to stand from [facility], AFO and shoes donned by PT; assist to extend hips/knees--pt limited due to pain and weakness Ambulation/Gait Ambulation/Gait Assistance: 1: +2 Total assist Ambulation/Gait: Patient Percentage: 70% Ambulation Distance (Feet): 5 Feet Assistive device: Rolling walker Ambulation/Gait Assistance Details: Better able to keep Lt foot in contact with the floor, although still on ball of foot (?flexor tone causing knee flexion) Gait Pattern: Step-to pattern;Decreased stride length;Decreased dorsiflexion - left;Left flexed knee in stance;Antalgic;Wide base of support Gait velocity: decreased    Exercises     PT Diagnosis:    PT Problem List:   PT Treatment Interventions:     PT Goals (current goals can now be found in the care plan section)    Visit Information  Last PT Received On: 04/18/13 Assistance Needed: +2 (occ Lt knee buckles) History of Present Illness: 67 yo female Underwent Bilateral L3, L4, L5 laminectomy and facetectomy. PLIF L3-4 L4-5 L5 - S1 Pt with hx of craniotomy and Lt foot droop    Subjective Data  Subjective: Reports sciatic type pain   Cognition  Cognition Arousal/Alertness: Awake/alert Behavior During Therapy: WFL for tasks assessed/performed Overall Cognitive Status: Within Functional Limits for tasks assessed    Balance  Static Sitting Balance Static Sitting - Balance Support: Bilateral upper extremity supported;Feet supported Static Sitting - Level of Assistance: 4: Min Oncologist Standing - Balance Support: Bilateral  upper extremity supported Static Standing - Level of Assistance: 4: Min assist  End of Session PT - End of Session Equipment Utilized During Treatment: Gait  belt;Back brace;Other (comment) (Lt AFO) Activity Tolerance: Patient limited by fatigue;Patient limited by pain Patient left: in chair;with call bell/phone within reach;with family/visitor present   GP     Latrecia Capito 04/18/2013, 9:54 AM Pager [phone]

## 2013-04-18 NOTE — PMR Pre-admission (Signed)
PMR Admission Coordinator Pre-Admission Assessment  Patient: [name] is an 67 y.o., female MRN: [id number] DOB: [birth year] Height: 5\' 5"  (165.1 cm) Weight: 87.5 kg (192 lb 14.4 oz)              Insurance Information HMO:     PPO: yes     PCP:      IPA:      80/20:      OTHER:  PRIMARY: Charlotte Crumb      Policy#: [id number]      Subscriber: spouse CM Name: Darl Pikes      Phone#: [phone]     Fax#: [phone] Pre-Cert#: [id number]      Employer: spouse update needed in 7 days Benefits:  Phone #: [phone]     Name: 04/17/13 Eff. Date: 02/25/12     Deduct: $750      Out of Pocket Max: $3000 includes deductible      Life Max: none CIR: 80%      SNF: 80% 60 days per year Outpatient: 80%     Co-Pay: 50 visits combined Home Health: 80%      Co-Pay: 20% DME: 80%     Co-Pay: 20% Providers: in network  SECONDARY: Medicare part A only      Policy#: [id number]      Subscriber: pt Has never used [facility]  Emergency Contact Information Contact Information   Name Relation Home Work Mobile   Serpa,Alan Spouse [phone]  [phone]     Current Medical History  Patient Admitting Diagnosis: lumbar DDD with stenosis and radiculopathy s/p L3-5 fusion, lami, prior brain tumor resection  History of Present Illness: [name] is a 67 y.o. 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   Admitted 04/12/2013 with progressive low back pain radiating to her extremities. X-rays and imaging revealed degenerative disc disease as well as lumbar L3-4 central disc protrusion with stenosis/radiculopathy. Underwent bilateral L3, 4, 5 laminectomy facetectomy was interbody fusion with pedicle screws 04/13/2013 per Dr. Jeral Fruit. Postoperative pain limiting her functional progress to date. Back brace when out of bed.   Psychiatry consulted on  9/23 due to verbalizations on 9/22 of depression and suicidal ideations. Safety sitter at bedside. Patient endorsed symptoms of depression and anxiety but denied suicidal or homicidal ideation. Major depressive disorder with recommendations to contract for safety, increase Wellbutrin to 200 mg po QD, continue Zoloft 50 mg po QD.  Past Medical History  Past Medical History  Diagnosis Date  . Benign neoplasm of colon   . Depressive disorder, not elsewhere classified   . Anxiety state, unspecified   . Other and unspecified hyperlipidemia   . Esophageal reflux   . Obesity   . Hypothyroidism   . Hemorrhoids   . Colon polyps     hyperplastic  . Family history of colon cancer mother  . Lymphocytic colitis   . Contact lens/glasses fitting     wears glassses or contacts  . Complications affecting other specified body systems, hypertension     No cardiologist   . Seizures     x 1  . Brain tumor     Family History  family history includes Bone cancer in her maternal grandmother; Breast cancer in her mother; Colon cancer in her mother; Heart attack in her father; Lung cancer in her maternal uncle.  Prior Rehab/Hospitalizations: CIR 1999 after  brain tumor resection  Current Medications  Current facility-administered medications:0.9 %  sodium chloride infusion, 250 mL, Intravenous, Continuous, Karn Cassis, MD;  0.9 %  sodium chloride infusion, , Intravenous, Continuous, Karn Cassis, MD, Last Rate: 75 mL/hr at 04/13/13 1030;  acetaminophen (TYLENOL) suppository 650 mg, 650 mg, Rectal, Q4H PRN, Karn Cassis, MD;  acetaminophen (TYLENOL) tablet 650 mg, 650 mg, Oral, Q4H PRN, Karn Cassis, MD atorvastatin (LIPITOR) tablet 40 mg, 40 mg, Oral, QHS, Karn Cassis, MD, 40 mg at 04/18/13 2114;  buPROPion [facility] SR) 12 hr tablet 200 mg, 200 mg, Oral, q morning - 10a, Nehemiah Settle, MD;  cyclobenzaprine (FLEXERIL) tablet 10 mg, 10 mg, Oral, TID PRN, Karn Cassis, MD,  10 mg at 04/18/13 1017;  dexamethasone (DECADRON) tablet 4 mg, 4 mg, Oral, Q6H, Karn Cassis, MD, 4 mg at 04/19/13 0506 diphenoxylate-atropine (LOMOTIL) 2.5-0.025 MG per tablet 1 tablet, 1 tablet, Oral, BID PRN, Karn Cassis, MD;  gabapentin (NEURONTIN) capsule 300 mg, 300 mg, Oral, TID, Karn Cassis, MD, 300 mg at 04/18/13 2114;  hydrochlorothiazide (HYDRODIURIL) tablet 25 mg, 25 mg, Oral, Daily, Karn Cassis, MD, 25 mg at 04/18/13 1015;  HYDROmorphone (DILAUDID) tablet 2-4 mg, 2-4 mg, Oral, Q4H PRN, Karn Cassis, MD, 2 mg at 04/18/13 2330 lactated ringers infusion, , Intravenous, Continuous, Karn Cassis, MD, Last Rate: 50 mL/hr at 04/12/13 0911;  levothyroxine (SYNTHROID, LEVOTHROID) tablet 88 mcg, 88 mcg, Oral, Daily, Karn Cassis, MD, 88 mcg at 04/18/13 1016;  loratadine (CLARITIN) tablet 10 mg, 10 mg, Oral, Daily, Karn Cassis, MD, 10 mg at 04/18/13 1015;  menthol-cetylpyridinium (CEPACOL) lozenge 3 mg, 1 lozenge, Oral, PRN, Karn Cassis, MD ondansetron [facility]) injection 4 mg, 4 mg, Intravenous, Q4H PRN, Karn Cassis, MD, 4 mg at 04/13/13 2035;  pantoprazole (PROTONIX) EC tablet 40 mg, 40 mg, Oral, QHS, Lauren Bajbus, RPH, 40 mg at 04/18/13 2117;  phenol (CHLORASEPTIC) mouth spray 1 spray, 1 spray, Mouth/Throat, PRN, Karn Cassis, MD;  senna [facility]) tablet 8.6 mg, 1 tablet, Oral, BID, Karn Cassis, MD, 8.6 mg at 04/18/13 2114 sertraline (ZOLOFT) tablet 50 mg, 50 mg, Oral, Daily, Karn Cassis, MD, 50 mg at 04/18/13 1016;  sodium chloride 0.9 % injection 3 mL, 3 mL, Intravenous, Q12H, Karn Cassis, MD, 3 mL at 04/18/13 2115;  sodium chloride 0.9 % injection 3 mL, 3 mL, Intravenous, PRN, Karn Cassis, MD;  sodium phosphate (FLEET) 7-19 GM/118ML enema 1 enema, 1 enema, Rectal, Daily PRN, Karn Cassis, MD, 1 enema at 04/18/13 1529  Patients Current Diet: General  Precautions / Restrictions Precautions Precautions: Back Precaution Booklet  Issued: No Precaution Comments: able to state 1/3 precautions; re-instructed Spinal Brace: Lumbar corset;Applied in sitting position Other Brace/Splint: AFO Lt foot Restrictions Weight Bearing Restrictions: No   Prior Activity Level  patient actvie transporting grandchildren to activities pta. Drove, wore l AFO and used cane pta Home Assistive Devices / Equipment Home Assistive Devices/Equipment: Cane (specify quad or straight) Home Equipment: Shower seat - built in;Hand held shower head;Grab bars - tub/shower;Grab bars - toilet;Cane - single point;Walker - 2 wheels;Crutches  Prior Functional Level Prior Function Level of Independence: Independent  Current Functional Level Cognition  Overall Cognitive Status: Within Functional Limits for tasks assessed Current Attention Level: Sustained Orientation Level: Oriented to person;Oriented to place Safety/Judgement: Decreased awareness of safety;Decreased awareness of deficits    Extremity Assessment (includes Sensation/Coordination)  ADLs  Eating/Feeding: Set up Where Assessed - Eating/Feeding: Chair Grooming: Teeth care;Minimal assistance Where Assessed - Grooming: Supported standing (pt unable to maintain static standing on LT LE) Lower Body Bathing: +2 Total assistance Lower Body Bathing: Patient Percentage: 0% Where Assessed - Lower Body Bathing: Supported sit to stand (total (A) for peri - two person assist to static stand) Lower Body Dressing: Minimal assistance Where Assessed - Lower Body Dressing: Unsupported sitting (use of AE required. see adl section) Toilet Transfer: Maximal assistance Toilet Transfer Method: Stand pivot Toilet Transfer Equipment: Raised toilet seat with arms (or 3-in-1 over toilet) Toileting - Clothing Manipulation and Hygiene: +2 Total assistance Toileting - Clothing Manipulation and Hygiene: Patient Percentage: 0% Where Assessed - Toileting Clothing Manipulation and Hygiene: Sit to stand  from 3-in-1 or toilet Tub/Shower Transfer: Maximal assistance (chair to 3n1 in shower no threshold) Tub/Shower Transfer Method: Stand pivot Equipment Used: Gait belt;Back brace;Rolling walker (AFO not don for pending shower) Transfers/Ambulation Related to ADLs: Pt completed sit<>Stand with posterior lean. pt started crying standing "I can't" Pt impulsive and attempting to return to sitting. Pt transferred sit<>stand  stand pivot right to chair. pt pushed in chair to bathroom. Pt describes shoot paining and will have a sudden pushing posteriorly (retropulsion). Pt transfered chair<>3n1. Pt required 3n1<>chair<>bed to return to supine ADL Comments: Pt supine in bed on arrival. Family descibes patient has having hallunications and seeing deceased parents at night. Pt does not report to OT any cognitive changes at this time. Pt describes pain supine. pt agreeable to OOB> pt very impulsive and decr ability to complete bed transfer. pt with decr sequence for bed mobility compared to previous session. pt sit<>stand with only TWD weight bearing and progressively pulling LT LE into NWB status. pt unable to complete any transfer with weight bearing. pt static standing on RT LE only. Pt holding LT LE in a hip extension, knee flexion, posteriorly tucked position. Pt encouraged to demonstrate knee extension pt tearful and states "I CAN"T" Pt describes shooting pain at the hip. RN Victorino Dike arriving and providing muscle relaxer at this time. Pt while on 3n1 for possible shower voiding bladder. pt stating "i can't take a shower put me back in the bed" Pt then voiding bladder to due poor recall of no bucket with pending shower. pt demonstrates sustained attention level and poor recall of short term memory. Pt with cognitive deficits present. Pt positioned in supine at end of session due  unable to tolerate upright posture.    Mobility  Bed Mobility: Rolling Left;Left Sidelying to Sit;Sitting - Scoot to [place] of Bed Rolling  Left: 4: Min assist;With rail Left Sidelying to Sit: 4: Min assist;With rails;HOB flat Supine to Sit: 3: Mod assist;With rails;HOB elevated Sitting - Scoot to Edge of Bed: 4: Min guard Sit to Supine: 3: Mod assist;HOB flat Sit to Sidelying Left: 3: Mod assist    Transfers  Transfers: Sit to Stand;Stand to Sit;Stand Pivot Transfers Sit to Stand: 1: +2 Total assist;With upper extremity assist Sit to Stand: Patient Percentage: 60% Stand to Sit: 4: Min assist;With upper extremity assist Stand Pivot Transfers: 1: +2 Total assist Stand Pivot Transfers: Patient Percentage: 70%    Ambulation / Gait / Stairs / Wheelchair Mobility  Ambulation/Gait Ambulation/Gait Assistance: 1: +2 Total assist Ambulation/Gait: Patient Percentage: 70% Ambulation Distance (Feet): 5 Feet Assistive device: Rolling walker Ambulation/Gait Assistance Details: Better able to keep Lt foot in contact with the floor, although still on ball of foot (?flexor tone causing  knee flexion) Gait Pattern: Step-to pattern;Decreased stride length;Decreased dorsiflexion - left;Left flexed knee in stance;Antalgic;Wide base of support Gait velocity: decreased General Gait Details: Distance limited today due to pain, and weakness LLE. Stairs: No    Posture / Balance Static Sitting Balance Static Sitting - Balance Support: Bilateral upper extremity supported;Feet supported Static Sitting - Level of Assistance: 4: Min Oncologist Standing - Balance Support: Bilateral upper extremity supported Static Standing - Level of Assistance: 4: Min assist    Special needs/care consideration Bowel mgmt: continent, enema 9/23 with results Bladder mgmt: continent    Previous Home Environment Living Arrangements: Spouse/significant other  Lives With: Spouse Available Help at Discharge: Available PRN/intermittently Type of Home: House Home Layout: One level;Able to live on main level with bedroom/bathroom Home  Access: Stairs to enter Entrance Stairs-Rails: None Entrance Stairs-Number of Steps: 3 Bathroom Shower/Tub: Health visitor: Handicapped height Home Care Services: No  Discharge Living Setting Plans for Discharge Living Setting: Patient's home;Lives with (comment);Other (Comment) (spouse) Type of Home at Discharge: House Discharge Home Access: Stairs to enter Entrance Stairs-Rails: None Entrance Stairs-Number of Steps: 3 steps Discharge Bathroom Shower/Tub: Walk-in shower Discharge Bathroom Toilet: Handicapped height Does the patient have any problems obtaining your medications?: No  Social/Family/Support Systems Patient Roles: Spouse;Parent Contact Information: Talana Slatten, spouse Anticipated Caregiver's Contact Information: home [phone]; cell [phone] Ability/Limitations of Caregiver: spouse works in home and travels at times; Forensic scientist Discharge Plan Discussed with Primary Caregiver: Yes Is Caregiver In Agreement with Plan?: Yes Does Caregiver/Family have Issues with Lodging/Transportation while Pt is in Rehab?: No    Goals/Additional Needs Patient/Family Goal for Rehab: supervision with PT, supervision to min assist OT, n/a SLP Expected length of stay: [facility] 2 weeks Special Service Needs: psych consult 9/23 with depressive disorder diagnosis and not suicidal sitter to be removed Pt/Family Agrees to Admission and willing to participate: Yes Program Orientation Provided & Reviewed with Pt/Caregiver Including Roles  & Responsibilities: Yes   Decrease burden of Care through IP rehab admission: n/a  Possible need for SNF placement upon discharge:not anticipated  Patient Condition: This patient's medical and functional status has changed since the consult dated: 04/17/13 in which the Rehabilitation Physician determined and documented that the patient's condition is appropriate for intensive rehabilitative care in an inpatient rehabilitation facility.  See "History of Present Illness" (above) for medical update. Functional changes are: overall mod assist with transfers and mobilized 5 feet. Patient's medical and functional status update has been discussed with the Rehabilitation physician and patient remains appropriate for inpatient rehabilitation. Will admit to inpatient rehab today.  Preadmission Screen Completed By:  Clois Dupes, 04/19/2013 9:19 AM ______________________________________________________________________   Discussed status with Dr. Riley Kill on 04/19/13 at  0919 and received telephone approval for admission today.  Admission Coordinator:  Kandas, Oliveto, time 6578 Date 04/19/13.

## 2013-04-18 NOTE — Progress Notes (Signed)
Pt evaluated by Psychiatric  Md , denied any suicidal ideation family at bed side and twin granddaughters visiting .pt calm , alert  awake and oriented x3 Ate 75% of dinner.new orders noted but safety sitter order not d/c by Md. RN will continue to monitor.

## 2013-04-18 NOTE — Progress Notes (Signed)
I met with patient, spouse, and sitter at bedside. Await insurance approval and psych clearance to admit pt to inpt rehab hopefully tomorrow. [phone]

## 2013-04-18 NOTE — Consult Note (Signed)
Reason for Consult: Depression and suicidal ideations Referring Physician: Dr. [name] is an 67 y.o. female.  HPI: [name] is a 67 y.o. female with history of depression and has been taking medication from PCP and no previous history of [facility] admissions. She endorses symptoms of depression and anxiety but denied suicidal or homicidal ideation. She has no history of suicidal attempt or behaviors. Reportedly has strong support from her family and she has attachment with her six grand children and has faith which works against her suicidal thoughts. She has son who fought with cancer. She stated that she dealt with worse moments of her life.   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 Admitted 04/12/2013 with progressive low back pain radiating to her extremities. X-rays and imaging revealed degenerative disc disease as well as lumbar L3-4 central disc protrusion with stenosis/radiculopathy. Underwent bilateral L3, 4, 5 laminectomy facetectomy was interbody fusion with pedicle screws 04/13/2013 per Dr. Jeral Fruit. Postoperative pain management. Back brace when out of bed. Physical and occupational therapy evaluations pending. M.D. is requested physical medicine rehabilitation consult to consider inpatient rehabilitation services after she failed to progress with therapies   MSE: Patient has depressed mood and tearful affect. She has normal speech and thought process. She has denied SI/HI and no current evidence of psychosis  Past Medical History  Diagnosis Date  . Benign neoplasm of colon   . Depressive disorder, not elsewhere classified   . Anxiety state, unspecified   . Other and unspecified hyperlipidemia   . Esophageal reflux   . Obesity   . Hypothyroidism   . Hemorrhoids   . Colon polyps    hyperplastic  . Family history of colon cancer mother  . Lymphocytic colitis   . Contact lens/glasses fitting     wears glassses or contacts  . Complications affecting other specified body systems, hypertension     No cardiologist   . Seizures     x 1  . Brain tumor     Past Surgical History  Procedure Laterality Date  . Craniotomy    . Back surgery    . Abdominal hysterectomy    . Appendectomy    . Carpal tunnel release      right  . Brain surgery  1999    benign tumor  . Hammertoe reconstruction with weil osteotomy  07/07/2012    Procedure: HAMMERTOE RECONSTRUCTION WITH WEIL OSTEOTOMY;  Surgeon: Toni Arthurs, MD;  Location: [facility];  Service: Orthopedics;  Laterality: Left;  Left Second through Fourth Hammertoe Correction; Left Fifth Toe Flexor Tendon Release, and Left Second through Fourth Weil Osteotomy    Family History  Problem Relation Age of Onset  . Breast cancer Mother   . Colon cancer Mother   . Lung cancer Maternal Uncle   . Bone cancer Maternal Grandmother   . Heart attack Father     Social History:  reports that she quit smoking about 2 years ago. She has never used smokeless tobacco. She reports that she drinks about 3.5 ounces of alcohol per week. She reports that she does not use illicit drugs.  Allergies:  Allergies  Allergen Reactions  . Codeine Other (See Comments)    nightmares  . Hydrocodone-Acetaminophen Itching    Has to take benadryl with to tolerate  . Hydrocodone Itching  Has to take benadryl to tolerate med  . Sulfonamide Derivatives Rash    Medications: I have reviewed the patient's current medications.  Results for orders placed during the hospital encounter of 04/12/13 (from the past 48 hour(s))  URINALYSIS, ROUTINE W REFLEX MICROSCOPIC     Status: Abnormal   Collection Time    04/17/13 11:51 AM      Result Value Range   Color, Urine YELLOW  YELLOW   APPearance CLOUDY (*) CLEAR   Specific Gravity, Urine 1.008   1.005 - 1.030   pH 7.5  5.0 - 8.0   Glucose, UA NEGATIVE  NEGATIVE mg/dL   Hgb urine dipstick NEGATIVE  NEGATIVE   Bilirubin Urine NEGATIVE  NEGATIVE   Ketones, ur NEGATIVE  NEGATIVE mg/dL   Protein, ur NEGATIVE  NEGATIVE mg/dL   Urobilinogen, UA 1.0  0.0 - 1.0 mg/dL   Nitrite NEGATIVE  NEGATIVE   Leukocytes, UA MODERATE (*) NEGATIVE  URINE MICROSCOPIC-ADD ON     Status: Abnormal   Collection Time    04/17/13 11:51 AM      Result Value Range   Squamous Epithelial / LPF FEW (*) RARE   WBC, UA 3-6  <3 WBC/hpf   Bacteria, UA RARE  RARE   Urine-Other AMORPHOUS URATES/PHOSPHATES      Dg Lumbar Spine 2-3 Views  04/17/2013   *RADIOLOGY REPORT*  Clinical Data: Bilateral hip pain after lumbar fusion 04/12/2013  LUMBAR SPINE - 2-3 VIEW  Comparison: Intraoperative imaging 04/12/2013  Findings: Evidence of L3 S1 fusion re-identified without evidence for hardware failure.  Stable changes of disc degenerative change at L3-L4 and L5 S1 predominately.  Normal alignment allowing for technique.  IMPRESSION: Expected postoperative appearance after L3 S1 diffusion.   Original Report Authenticated By: Christiana Pellant, M.D.    Positive for anxiety, bad mood, depression and sleep disturbance Blood pressure 133/62, pulse 81, temperature 98.2 F (36.8 C), temperature source Oral, resp. rate 18, height 5\' 5"  (1.651 m), weight 87.5 kg (192 lb 14.4 oz), SpO2 98.00%.   Assessment/Plan: Major depressive disorder Recurrent  Recommendation:  Patient contract for safety and does not meet criteria of acute psych hospital Increase Wellbutrin 200 mg PO QD Continue Zoloft 50 mg PO QD Monitor for adverse effects Appreciate psych consult and may conatct [phone] for further assistance if needed  Zohan Shiflet,JANARDHAHA R. 04/18/2013, 5:06 PM

## 2013-04-18 NOTE — Progress Notes (Signed)
Patient ID: [name], female   DOB: [birth year], 67 y.o.   MRN: [id number] Much better than yesterday. Decrease of pain. C/o some spasms. Wound dry. Rehabilitation seeing about transfer if ok with her insurance.

## 2013-04-18 NOTE — Progress Notes (Signed)
Spoke with Dr Jeral Fruit to notify of need for Psych consult to clear patient and remove suicide sitter to facilitate transfer to CIR.  Dr Jeral Fruit to call psych consult.

## 2013-04-19 ENCOUNTER — Encounter (HOSPITAL_COMMUNITY): Payer: Self-pay | Admitting: *Deleted

## 2013-04-19 ENCOUNTER — Inpatient Hospital Stay (HOSPITAL_COMMUNITY)
Admission: RE | Admit: 2013-04-19 | Discharge: 2013-04-26 | DRG: 462 | Disposition: A | Discharge status: Home / Self Care | Payer: BC Managed Care – PPO | Source: Intra-hospital | Attending: Physical Medicine & Rehabilitation | Admitting: Physical Medicine & Rehabilitation

## 2013-04-19 DIAGNOSIS — Z86011 Personal history of benign neoplasm of the brain: Secondary | ICD-10-CM

## 2013-04-19 DIAGNOSIS — F329 Major depressive disorder, single episode, unspecified: Secondary | ICD-10-CM | POA: Diagnosis present

## 2013-04-19 DIAGNOSIS — M5137 Other intervertebral disc degeneration, lumbosacral region: Secondary | ICD-10-CM | POA: Diagnosis present

## 2013-04-19 DIAGNOSIS — F3289 Other specified depressive episodes: Secondary | ICD-10-CM | POA: Diagnosis present

## 2013-04-19 DIAGNOSIS — Z981 Arthrodesis status: Secondary | ICD-10-CM

## 2013-04-19 DIAGNOSIS — G4700 Insomnia, unspecified: Secondary | ICD-10-CM | POA: Diagnosis present

## 2013-04-19 DIAGNOSIS — Z5189 Encounter for other specified aftercare: Principal | ICD-10-CM

## 2013-04-19 DIAGNOSIS — M216X9 Other acquired deformities of unspecified foot: Secondary | ICD-10-CM | POA: Diagnosis present

## 2013-04-19 DIAGNOSIS — F411 Generalized anxiety disorder: Secondary | ICD-10-CM | POA: Diagnosis present

## 2013-04-19 DIAGNOSIS — I10 Essential (primary) hypertension: Secondary | ICD-10-CM | POA: Diagnosis present

## 2013-04-19 DIAGNOSIS — A498 Other bacterial infections of unspecified site: Secondary | ICD-10-CM | POA: Diagnosis present

## 2013-04-19 DIAGNOSIS — N390 Urinary tract infection, site not specified: Secondary | ICD-10-CM

## 2013-04-19 DIAGNOSIS — IMO0002 Reserved for concepts with insufficient information to code with codable children: Secondary | ICD-10-CM

## 2013-04-19 DIAGNOSIS — D126 Benign neoplasm of colon, unspecified: Secondary | ICD-10-CM | POA: Diagnosis present

## 2013-04-19 DIAGNOSIS — K219 Gastro-esophageal reflux disease without esophagitis: Secondary | ICD-10-CM | POA: Diagnosis present

## 2013-04-19 DIAGNOSIS — M79609 Pain in unspecified limb: Secondary | ICD-10-CM

## 2013-04-19 DIAGNOSIS — E039 Hypothyroidism, unspecified: Secondary | ICD-10-CM | POA: Diagnosis present

## 2013-04-19 DIAGNOSIS — K5900 Constipation, unspecified: Secondary | ICD-10-CM | POA: Diagnosis present

## 2013-04-19 DIAGNOSIS — E669 Obesity, unspecified: Secondary | ICD-10-CM | POA: Diagnosis present

## 2013-04-19 DIAGNOSIS — M7989 Other specified soft tissue disorders: Secondary | ICD-10-CM

## 2013-04-19 DIAGNOSIS — E785 Hyperlipidemia, unspecified: Secondary | ICD-10-CM | POA: Diagnosis present

## 2013-04-19 DIAGNOSIS — M47817 Spondylosis without myelopathy or radiculopathy, lumbosacral region: Secondary | ICD-10-CM

## 2013-04-19 DIAGNOSIS — M51379 Other intervertebral disc degeneration, lumbosacral region without mention of lumbar back pain or lower extremity pain: Secondary | ICD-10-CM | POA: Diagnosis present

## 2013-04-19 DIAGNOSIS — Z87891 Personal history of nicotine dependence: Secondary | ICD-10-CM

## 2013-04-19 DIAGNOSIS — M5416 Radiculopathy, lumbar region: Secondary | ICD-10-CM

## 2013-04-19 MED ORDER — PANTOPRAZOLE SODIUM 40 MG PO TBEC
40.0000 mg | DELAYED_RELEASE_TABLET | Freq: Every day | ORAL | Status: DC
  Administered 2013-04-19 – 2013-04-25 (×7): 40 mg via ORAL
  Filled 2013-04-19 (×7): qty 1

## 2013-04-19 MED ORDER — GABAPENTIN 300 MG PO CAPS
300.0000 mg | ORAL_CAPSULE | Freq: Three times a day (TID) | ORAL | Status: DC
  Administered 2013-04-19 – 2013-04-26 (×21): 300 mg via ORAL
  Filled 2013-04-19 (×24): qty 1

## 2013-04-19 MED ORDER — DEXAMETHASONE 2 MG PO TABS
2.0000 mg | ORAL_TABLET | Freq: Four times a day (QID) | ORAL | Status: DC
  Administered 2013-04-19 – 2013-04-25 (×23): 2 mg via ORAL
  Filled 2013-04-19 (×28): qty 1

## 2013-04-19 MED ORDER — LORATADINE 10 MG PO TABS
10.0000 mg | ORAL_TABLET | Freq: Every day | ORAL | Status: DC
  Administered 2013-04-20 – 2013-04-26 (×7): 10 mg via ORAL
  Filled 2013-04-19 (×8): qty 1

## 2013-04-19 MED ORDER — SERTRALINE HCL 50 MG PO TABS
50.0000 mg | ORAL_TABLET | Freq: Every day | ORAL | Status: DC
  Administered 2013-04-20 – 2013-04-26 (×7): 50 mg via ORAL
  Filled 2013-04-19 (×9): qty 1

## 2013-04-19 MED ORDER — ONDANSETRON HCL 4 MG PO TABS: 4.0000 mg | ORAL_TABLET | Freq: Four times a day (QID) | ORAL | Status: DC | PRN

## 2013-04-19 MED ORDER — ONDANSETRON HCL 4 MG/2ML IJ SOLN: 4.0000 mg | Freq: Four times a day (QID) | INTRAMUSCULAR | Status: DC | PRN

## 2013-04-19 MED ORDER — ATORVASTATIN CALCIUM 40 MG PO TABS
40.0000 mg | ORAL_TABLET | Freq: Every day | ORAL | Status: DC
  Administered 2013-04-19 – 2013-04-25 (×7): 40 mg via ORAL
  Filled 2013-04-19 (×8): qty 1

## 2013-04-19 MED ORDER — BUPROPION HCL ER (SR) 100 MG PO TB12
200.0000 mg | ORAL_TABLET | Freq: Every day | ORAL | Status: DC
  Administered 2013-04-20 – 2013-04-24 (×5): 200 mg via ORAL
  Filled 2013-04-19 (×6): qty 2

## 2013-04-19 MED ORDER — HYDROMORPHONE HCL 2 MG PO TABS
2.0000 mg | ORAL_TABLET | ORAL | Status: DC | PRN
  Administered 2013-04-19 – 2013-04-20 (×2): 4 mg via ORAL
  Administered 2013-04-24 – 2013-04-25 (×3): 2 mg via ORAL
  Filled 2013-04-19: qty 1
  Filled 2013-04-19 (×2): qty 2
  Filled 2013-04-19 (×2): qty 1

## 2013-04-19 MED ORDER — SENNA 8.6 MG PO TABS
1.0000 | ORAL_TABLET | Freq: Two times a day (BID) | ORAL | Status: DC
  Administered 2013-04-19 – 2013-04-26 (×14): 8.6 mg via ORAL
  Filled 2013-04-19 (×16): qty 1

## 2013-04-19 MED ORDER — METHOCARBAMOL 500 MG PO TABS
500.0000 mg | ORAL_TABLET | Freq: Four times a day (QID) | ORAL | Status: DC | PRN
  Administered 2013-04-24 (×2): 500 mg via ORAL
  Filled 2013-04-19 (×2): qty 1

## 2013-04-19 MED ORDER — LEVOTHYROXINE SODIUM 88 MCG PO TABS
88.0000 ug | ORAL_TABLET | Freq: Every day | ORAL | Status: DC
Start: 2013-04-20 — End: 2013-04-26
  Administered 2013-04-20 – 2013-04-26 (×7): 88 ug via ORAL
  Filled 2013-04-19 (×8): qty 1

## 2013-04-19 MED ORDER — DIPHENOXYLATE-ATROPINE 2.5-0.025 MG PO TABS: 1.0000 | ORAL_TABLET | Freq: Two times a day (BID) | ORAL | Status: DC | PRN

## 2013-04-19 MED ORDER — HYDROCHLOROTHIAZIDE 25 MG PO TABS
25.0000 mg | ORAL_TABLET | Freq: Every day | ORAL | Status: DC
  Administered 2013-04-20 – 2013-04-26 (×7): 25 mg via ORAL
  Filled 2013-04-19 (×8): qty 1

## 2013-04-19 MED ORDER — ACETAMINOPHEN 325 MG PO TABS
650.0000 mg | ORAL_TABLET | ORAL | Status: DC | PRN
  Administered 2013-04-21 (×2): 650 mg via ORAL
  Administered 2013-04-22 – 2013-04-23 (×3): 325 mg via ORAL
  Administered 2013-04-23: 650 mg via ORAL
  Filled 2013-04-19 (×6): qty 2

## 2013-04-19 MED ORDER — ACETAMINOPHEN 650 MG RE SUPP: 650.0000 mg | RECTAL | Status: DC | PRN

## 2013-04-19 NOTE — Progress Notes (Signed)
OT NOTE  Cir approval with pending admission today per [facility] admission coordinator. Ot to defer next session to CIR therapist.    Mateo Flow   OTR/L Pager: [phone] Office: [phone] .

## 2013-04-19 NOTE — Progress Notes (Signed)
Report given to rehab nurse.  Patient to be transferred to 4w23 after 1300.  Will continue to monitor. Lance Bosch, RN

## 2013-04-19 NOTE — Progress Notes (Signed)
Bilateral lower extremity venous duplex:  No evidence of DVT, superficial thrombosis, or Baker's Cyst.   

## 2013-04-19 NOTE — Progress Notes (Signed)
Subjective: Patient report less pain  Objective: Vital signs in last 24 hours: Temp:  [98.2 F (36.8 C)-98.7 F (37.1 C)] 98.7 F (37.1 C) (09/24 0936) Pulse Rate:  [68-88] 80 (09/24 0936) Resp:  [18] 18 (09/24 0936) BP: (103-146)/(62-94) 117/72 mmHg (09/24 0936) SpO2:  [96 %-98 %] 98 % (09/24 0936)  Intake/Output from previous day: 09/23 0701 - 09/24 0700 In: 960 [P.O.:960] Out: 550 [Urine:550] Intake/Output this shift: Total I/O In: 240 [P.O.:240] Out: -  Wound dry. Able to walk more.   Lab Results: No results found for this basename: WBC, HGB, HCT, PLT,  in the last 72 hours BMET No results found for this basename: NA, K, CL, CO2, GLUCOSE, BUN, CREATININE, CALCIUM,  in the last 72 hours  Studies/Results: No results found.  Assessment/Plan: Waiting to be transfer to rehabilitation  LOS: 7 days  To rehab   [name] M 04/19/2013, 12:01 PM

## 2013-04-19 NOTE — Progress Notes (Signed)
I met with pt, spouse, and sitter at bedside. I have insurance approval to admit pt to inpt rehab today. I will contact Dr. Jeral Fruit to give medical clearance to admit today and to clarify need for sitter. [phone]

## 2013-04-19 NOTE — Progress Notes (Signed)
Patient being transferred to 4W23.  Lance Bosch, RN

## 2013-04-19 NOTE — Discharge Summary (Signed)
Physician Discharge Summary  Patient ID: [name] MRN: [id number] DOB/AGE: March 01, 1946 67 y.o.  Admit date: 04/19/2013 Discharge date: 04/19/2013  Admission Diagnoses:lumbar degenerative disc disease  Discharge Diagnoses: same Active Problems:   * No active hospital problems. *   Discharged Condition: stable  Hospital Course: surgery  Consults: psych  Significant Diagnostic Studies myelo  Treatments:  3 level lumbar fusion  Discharge Exam: There were no vitals taken for this visit. Wound dry. Old foot drop  Disposition: rehabilitation medicine     Medication List    ASK your doctor about these medications       aspirin 81 MG tablet  Take 81 mg by mouth 2 (two) times daily.     atorvastatin 40 MG tablet  Commonly known as:  LIPITOR  Take 1 tablets by mouth once daily.     Budesonide 9 MG Tb24  Take 3 mg by mouth 2 (two) times daily.     budesonide 3 MG 24 hr capsule  Commonly known as:  ENTOCORT EC  Take 1 capsule (3 mg total) by mouth 3 (three) times daily.     buPROPion 150 MG 12 hr tablet  Commonly known as:  WELLBUTRIN SR  Take 150 mg by mouth daily.     Calcium Carbonate-Vitamin D 600-400 MG-UNIT per tablet  Take 1 tablet by mouth 2 (two) times daily.     cetirizine 10 MG tablet  Commonly known as:  ZYRTEC  Take 10 mg by mouth daily. "aller-tec"     diphenoxylate-atropine 2.5-0.025 MG per tablet  Commonly known as:  LOMOTIL  Take 1 tablet by mouth 2 (two) times daily.     esomeprazole 40 MG capsule  Commonly known as:  NEXIUM  Take 40 mg by mouth daily before breakfast.     Fish Oil 1000 MG Caps  Take 1 capsule by mouth 2 (two) times daily.     fluticasone 50 MCG/ACT nasal spray  Commonly known as:  FLONASE  Place 2 sprays into the nose daily.     hydrochlorothiazide 25 MG tablet  Commonly known as:  HYDRODIURIL  Take 25 mg by mouth daily.     HYDROcodone-acetaminophen 10-325 MG per tablet  Commonly known as:  NORCO  Take 1  tablet by mouth every 6 (six) hours as needed for pain.     hydrocortisone 25 MG suppository  Commonly known as:  ANUSOL-HC  PLACE 1 SUPPOSITORY (25 MG TOTAL) RECTALLY AT BEDTIME AS NEEDED.     hydrocortisone acetate 0.5 % cream  Apply 1 application topically daily as needed (for hemorrhoids).     levothyroxine 88 MCG tablet  Commonly known as:  SYNTHROID, LEVOTHROID  Take 88 mcg by mouth daily.     Melatonin 5 MG Caps  Take 10 mg by mouth daily.     nabumetone 500 MG tablet  Commonly known as:  RELAFEN  Take 500 mg by mouth 2 (two) times daily.     sertraline 50 MG tablet  Commonly known as:  ZOLOFT  Take 50 mg by mouth daily.     traMADol 50 MG tablet  Commonly known as:  ULTRAM  Take 50 mg by mouth every 8 (eight) hours as needed.     vitamin C 1000 MG tablet  Take 1,000 mg by mouth daily.         Signed: Karn Cassis 04/19/2013, 3:02 PM

## 2013-04-19 NOTE — H&P (Signed)
Physical Medicine and Rehabilitation Admission H&P      CC: Back and leg pain, confusion. : HPI: [name] is a 67 y.o. 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 Admitted 04/12/2013 with progressive low back pain radiating to her extremities. X-rays and imaging revealed degenerative disc disease as well as lumbar L3-4 central disc protrusion with stenosis/radiculopathy. Underwent bilateral L3, 4, 5 laminectomy facetectomy was interbody fusion with pedicle screws 04/13/2013 per Dr. Jeral Fruit. Postoperative pain management with Dilaudid as well as Decadron tapered to 2 mg every 6 hours 04/19/2013.. Back brace when out of bed. There was some reported postoperative confusion and suicidal ideations. Patient was placed on suicide watch with psychiatry followup and her Wellbutrin was increased to 200 mg daily also advised to continue Zoloft. Her mental status continued to improve suicide watch was discontinued. Physical and occupational therapy evaluations completed with recommendations for physical medicine rehabilitation consult to consider inpatient rehabilitation services..  Patient was felt to be a good candidate for inpatient rehabilitation services and was admitted for a comprehensive rehabilitation program   Review of Systems  Gastrointestinal:        GERD  Musculoskeletal: Positive for myalgias and joint pain.  Neurological: Positive for headaches.  Psychiatric/Behavioral: Positive for depression. The patient has insomnia.         Anxiety  All other systems reviewed and are negative.    Past Medical History   Diagnosis  Date   .  Benign neoplasm of colon     .  Depressive disorder, not elsewhere classified     .  Anxiety state, unspecified     .  Other and unspecified hyperlipidemia     .  Esophageal reflux     .  Obesity      .  Hypothyroidism     .  Hemorrhoids     .  Colon polyps         hyperplastic   .  Family history of colon cancer  mother   .  Lymphocytic colitis     .  Contact lens/glasses fitting         wears glassses or contacts   .  Complications affecting other specified body systems, hypertension         No cardiologist    .  Seizures         x 1   .  Brain tumor      Past Surgical History   Procedure  Laterality  Date   .  Craniotomy       .  Back surgery       .  Abdominal hysterectomy       .  Appendectomy       .  Carpal tunnel release           right   .  Brain surgery    1999       benign tumor   .  Hammertoe reconstruction with weil osteotomy    07/07/2012       Procedure: HAMMERTOE RECONSTRUCTION WITH WEIL OSTEOTOMY;  Surgeon: Toni Arthurs, MD;  Location: [facility];  Service: Orthopedics;  Laterality: Left;  Left Second through Fourth Hammertoe Correction; Left Fifth Toe Flexor Tendon Release, and Left Second through Fourth Weil Osteotomy    Family History   Problem  Relation  Age  of Onset   .  Breast cancer  Mother     .  Colon cancer  Mother     .  Lung cancer  Maternal Uncle     .  Bone cancer  Maternal Grandmother     .  Heart attack  Father      Social History: reports that she quit smoking about 2 years ago. She has never used smokeless tobacco. She reports that she drinks about 3.5 ounces of alcohol per week. She reports that she does not use illicit drugs. Allergies:   Allergies   Allergen  Reactions   .  Codeine  Other (See Comments)       nightmares   .  Hydrocodone-Acetaminophen  Itching       Has to take benadryl with to tolerate   .  Hydrocodone  Itching       Has to take benadryl to tolerate med   .  Sulfonamide Derivatives  Rash    Medications Prior to Admission   Medication  Sig  Dispense  Refill   .  Ascorbic Acid (VITAMIN C) 1000 MG tablet  Take 1,000 mg by mouth daily.           Marland Kitchen  aspirin 81 MG tablet  Take 81 mg by mouth 2  (two) times daily.          Marland Kitchen  atorvastatin (LIPITOR) 40 MG tablet  Take 1 tablets by mouth once daily.         .  budesonide (ENTOCORT EC) 3 MG 24 hr capsule  Take 1 capsule (3 mg total) by mouth 3 (three) times daily.   270 capsule   1   .  buPROPion (WELLBUTRIN SR) 150 MG 12 hr tablet  Take 150 mg by mouth daily.          .  Calcium Carbonate-Vitamin D 600-400 MG-UNIT per tablet  Take 1 tablet by mouth 2 (two) times daily.           .  cetirizine (ZYRTEC) 10 MG tablet  Take 10 mg by mouth daily. "aller-tec"         .  esomeprazole (NEXIUM) 40 MG capsule  Take 40 mg by mouth daily before breakfast.           .  fluticasone (FLONASE) 50 MCG/ACT nasal spray  Place 2 sprays into the nose daily.         .  hydrochlorothiazide 25 MG tablet  Take 25 mg by mouth daily.          Marland Kitchen  HYDROcodone-acetaminophen (NORCO) 10-325 MG per tablet  Take 1 tablet by mouth every 6 (six) hours as needed for pain.         .  hydrocortisone (ANUSOL-HC) 25 MG suppository  PLACE 1 SUPPOSITORY (25 MG TOTAL) RECTALLY AT BEDTIME AS NEEDED.   100 suppository   0   .  hydrocortisone acetate 0.5 % cream  Apply 1 application topically daily as needed (for hemorrhoids).         Marland Kitchen  levothyroxine (SYNTHROID, LEVOTHROID) 88 MCG tablet  Take 88 mcg by mouth daily.         .  Melatonin 5 MG CAPS  Take 10 mg by mouth daily.          .  nabumetone (RELAFEN) 500 MG tablet  Take 500 mg by mouth 2 (two) times daily.          .  Omega-3  Fatty Acids (FISH OIL) 1000 MG CAPS  Take 1 capsule by mouth 2 (two) times daily.          .  sertraline (ZOLOFT) 50 MG tablet  Take 50 mg by mouth daily.         .  traMADol (ULTRAM) 50 MG tablet  Take 50 mg by mouth every 8 (eight) hours as needed.         .  Budesonide 9 MG TB24  Take 3 mg by mouth 2 (two) times daily.   540 tablet   1      Home: Home Living Family/patient expects to be discharged to:: Private residence Living Arrangements: Spouse/significant other Available Help at Discharge:  Available PRN/intermittently Type of Home: House Home Access: Stairs to enter [employer] of Steps: 3 Entrance Stairs-Rails: None Home Layout: One level;Able to live on main level with bedroom/bathroom Home Equipment: Shower seat - built in;Hand held shower head;Grab bars - tub/shower;Grab bars - toilet;Cane - single point;Walker - 2 wheels;Crutches  Lives With: Spouse    Functional History:   Functional Status:   Mobility: Bed Mobility Bed Mobility: Rolling Left;Left Sidelying to Sit;Sitting - Scoot to [place] of Bed Rolling Left: 4: Min assist;With rail Left Sidelying to Sit: 4: Min assist;With rails;HOB flat Supine to Sit: 3: Mod assist;With rails;HOB elevated Sitting - Scoot to Edge of Bed: 4: Min guard Sit to Supine: 3: Mod assist;HOB flat Sit to Sidelying Left: 3: Mod assist Transfers Transfers: Sit to Stand;Stand to Sit;Stand Pivot Transfers Sit to Stand: 1: +2 Total assist;With upper extremity assist Sit to Stand: Patient Percentage: 60% Stand to Sit: 4: Min assist;With upper extremity assist Stand Pivot Transfers: 1: +2 Total assist Stand Pivot Transfers: Patient Percentage: 70% Ambulation/Gait Ambulation/Gait Assistance: 1: +2 Total assist Ambulation/Gait: Patient Percentage: 70% Ambulation Distance (Feet): 5 Feet Assistive device: Rolling walker Ambulation/Gait Assistance Details: Better able to keep Lt foot in contact with the floor, although still on ball of foot (?flexor tone causing knee flexion) Gait Pattern: Step-to pattern;Decreased stride length;Decreased dorsiflexion - left;Left flexed knee in stance;Antalgic;Wide base of support Gait velocity: decreased General Gait Details: Distance limited today due to pain, and weakness LLE. Stairs: No   ADL: ADL Eating/Feeding: Set up Where Assessed - Eating/Feeding: Chair Grooming: Teeth care;Minimal assistance Where Assessed - Grooming: Supported standing (pt unable to maintain static standing on LT  LE) Lower Body Bathing: +2 Total assistance Where Assessed - Lower Body Bathing: Supported sit to stand (total (A) for peri - two person assist to static stand) Lower Body Dressing: Minimal assistance Where Assessed - Lower Body Dressing: Unsupported sitting (use of AE required. see adl section) Toilet Transfer: Maximal assistance Toilet Transfer Method: Stand pivot Toilet Transfer Equipment: Raised toilet seat with arms (or 3-in-1 over toilet) Tub/Shower Transfer: Maximal assistance (chair to 3n1 in shower no threshold) Tub/Shower Transfer Method: Stand pivot Equipment Used: Gait belt;Back brace;Rolling walker (AFO not don for pending shower) Transfers/Ambulation Related to ADLs: Pt completed sit<>Stand with posterior lean. pt started crying standing "I can't" Pt impulsive and attempting to return to sitting. Pt transferred sit<>stand  stand pivot right to chair. pt pushed in chair to bathroom. Pt describes shoot paining and will have a sudden pushing posteriorly (retropulsion). Pt transfered chair<>3n1. Pt required 3n1<>chair<>bed to return to supine ADL Comments: Pt supine in bed on arrival. Family descibes patient has having hallunications and seeing deceased parents at night. Pt does not report to OT any cognitive changes at this time. Pt  describes pain supine. pt agreeable to OOB> pt very impulsive and decr ability to complete bed transfer. pt with decr sequence for bed mobility compared to previous session. pt sit<>stand with only TWD weight bearing and progressively pulling LT LE into NWB status. pt unable to complete any transfer with weight bearing. pt static standing on RT LE only. Pt holding LT LE in a hip extension, knee flexion, posteriorly tucked position. Pt encouraged to demonstrate knee extension pt tearful and states "I CAN"T" Pt describes shooting pain at the hip. RN Victorino Dike arriving and providing muscle relaxer at this time. Pt while on 3n1 for possible shower voiding bladder. pt  stating "i can't take a shower put me back in the bed" Pt then voiding bladder to due poor recall of no bucket with pending shower. pt demonstrates sustained attention level and poor recall of short term memory. Pt with cognitive deficits present. Pt positioned in supine at end of session due  unable to tolerate upright posture.   Cognition: Cognition Overall Cognitive Status: Within Functional Limits for tasks assessed Orientation Level: Oriented to person;Oriented to place Cognition Arousal/Alertness: Awake/alert Behavior During Therapy: WFL for tasks assessed/performed Overall Cognitive Status: Within Functional Limits for tasks assessed Area of Impairment: Attention;Memory;Safety/judgement;Awareness;Problem solving Current Attention Level: Sustained Memory: Decreased short-term memory Safety/Judgement: Decreased awareness of safety;Decreased awareness of deficits Awareness: Emergent;Anticipatory Problem Solving: Slow processing;Difficulty sequencing   Physical Exam: Blood pressure 117/72, pulse 80, temperature 98.7 F (37.1 C), temperature source Oral, resp. rate 18, height 5\' 5"  (1.651 m), weight 87.5 kg (192 lb 14.4 oz), SpO2 98.00%. General: Alert and oriented x 3, No apparent distress . She made good eye contact with examiner. She did have some difficulty recalling the past few days but long-term memory was intact HEENT: Head is normocephalic, atraumatic, PERRLA, EOMI, sclera anicteric, oral mucosa pink and moist, dentition intact, ext ear canals clear,   Neck: Supple without JVD or lymphadenopathy   Heart: Reg rate and rhythm. No murmurs rubs or gallops   Chest: CTA bilaterally without wheezes, rales, or rhonchi; no distress   Abdomen: Soft, non-tender, non-distended, bowel sounds positive.   Extremities: No clubbing, cyanosis, or edema. Pulses are 2+   Skin: Clean and intact without signs of breakdown and Steri-Strips in place. No drainage Neuro: Mild LLE weakness, heel cord  contracture, mild left deltoid weakness, some pain inhibition. Otherwise strength was 5 RUE, 4+ LUE, 3-4/5 RLE and 3/5 LLE prox greater than distal. No consistent sensory findings.  Improve mentation today although still a little disconnected at times. No focal CN abnormalities.  Musculoskeletal: back expectedly tender.   Psych: Pt's affect is appropriate. Pt is cooperative with exam      Results for orders placed during the hospital encounter of 04/12/13 (from the past 48 hour(s))   URINALYSIS, ROUTINE W REFLEX MICROSCOPIC     Status: Abnormal     Collection Time      04/17/13 11:51 AM       Result  Value  Range     Color, Urine  YELLOW   YELLOW     APPearance  CLOUDY (*)  CLEAR     Specific Gravity, Urine  1.008   1.005 - 1.030     pH  7.5   5.0 - 8.0     Glucose, UA  NEGATIVE   NEGATIVE mg/dL     Hgb urine dipstick  NEGATIVE   NEGATIVE     Bilirubin Urine  NEGATIVE   NEGATIVE  Ketones, ur  NEGATIVE   NEGATIVE mg/dL     Protein, ur  NEGATIVE   NEGATIVE mg/dL     Urobilinogen, UA  1.0   0.0 - 1.0 mg/dL     Nitrite  NEGATIVE   NEGATIVE     Leukocytes, UA  MODERATE (*)  NEGATIVE   URINE MICROSCOPIC-ADD ON     Status: Abnormal     Collection Time      04/17/13 11:51 AM       Result  Value  Range     Squamous Epithelial / LPF  FEW (*)  RARE     WBC, UA  3-6   <3 WBC/hpf     Bacteria, UA  RARE   RARE     Urine-Other  AMORPHOUS URATES/PHOSPHATES       Dg Lumbar Spine 2-3 Views   04/17/2013   *RADIOLOGY REPORT*  Clinical Data: Bilateral hip pain after lumbar fusion 04/12/2013  LUMBAR SPINE - 2-3 VIEW  Comparison: Intraoperative imaging 04/12/2013  Findings: Evidence of L3 S1 fusion re-identified without evidence for hardware failure.  Stable changes of disc degenerative change at L3-L4 and L5 S1 predominately.  Normal alignment allowing for technique.  IMPRESSION: Expected postoperative appearance after L3 S1 diffusion.   Original Report Authenticated By: Christiana Pellant, M.D.      Post Admission Physician Evaluation: Functional deficits secondary  to lumbar stenosis with radiculopathy s/p lumbar decompression and fusion L3-S1. Has hx of right brain tumor s/p resection as well. Patient is admitted to receive collaborative, interdisciplinary care between the physiatrist, rehab nursing staff, and therapy team. Patient's level of medical complexity and substantial therapy needs in context of that medical necessity cannot be provided at a lesser intensity of care such as a SNF. Patient has experienced substantial functional loss from his/her baseline which was documented above under the "Functional History" and "Functional Status" headings.  Judging by the patient's diagnosis, physical exam, and functional history, the patient has potential for functional progress which will result in measurable gains while on inpatient rehab.  These gains will be of substantial and practical use upon discharge  in facilitating mobility and self-care at the household level. Physiatrist will provide 24 hour management of medical needs as well as oversight of the therapy plan/treatment and provide guidance as appropriate regarding the interaction of the two. 24 hour rehab nursing will assist with bladder management, bowel management, safety, skin/wound care, disease management, medication administration, pain management and patient education  and help integrate therapy concepts, techniques,education, etc. PT will assess and treat for/with: Lower extremity strength, range of motion, stamina, balance, functional mobility, safety, adaptive techniques and equipment, NMR, pain mgt, orthotic mgt.   Goals are: supervision to mod I. OT will assess and treat for/with: ADL's, functional mobility, safety, upper extremity strength, adaptive techniques and equipment, NMR, pain mgt, don/doffing of brace.   Goals are: mod I to min assist. SLP will assess and treat for/with: n/a.  Goals are: n/a. Case Management and  Social Worker will assess and treat for psychological issues and discharge planning. Team conference will be held weekly to assess progress toward goals and to determine barriers to discharge. Patient will receive at least 3 hours of therapy per day at least 5 days per week. ELOS: 10-12 days        Prognosis:  excellent     Medical Problem List and Plan: 1. lumbar DDD with stenosis and radiculopathy. Status post L3-5 fusion, laminectomy, prior brain tumor resection 2. DVT  Prophylaxis/Anticoagulation: SCDs. Check vascular studies 3. Pain Management: Neurontin 300 mg 3 times a day, Dilaudid 2-4 mg every 4 hours as needed and Robaxin as needed. Patient also with Decadron therapy with planned taper. Monitor mental status related to narcotics----minimize narcs as possible. Pt understands that she's sensitive to these meds. 4. Mood/depression. Zoloft 50 mg daily, Wellbutrin 200 mg daily. Provide emotional support and 5. Neuropsych: This patient is capable of making decisions on her own behalf. 6. Hypertension. Hydrochlorothiazide 25 mg daily. Monitor with increased mobility 7. Hypothyroidism. Synthroid 8. Hyperlipidemia. Lipitor 9. Constipation. Adjust bowel program as needed 10. Uro: urine malodorous---check urine cultre, ua positive  Ranelle Oyster, MD, [facility] Physical Medicine & Rehabilitation  04/19/2013

## 2013-04-20 ENCOUNTER — Inpatient Hospital Stay (HOSPITAL_COMMUNITY): Payer: BC Managed Care – PPO

## 2013-04-20 ENCOUNTER — Inpatient Hospital Stay (HOSPITAL_COMMUNITY): Payer: BC Managed Care – PPO | Admitting: Occupational Therapy

## 2013-04-20 DIAGNOSIS — IMO0002 Reserved for concepts with insufficient information to code with codable children: Secondary | ICD-10-CM

## 2013-04-20 DIAGNOSIS — M47817 Spondylosis without myelopathy or radiculopathy, lumbosacral region: Secondary | ICD-10-CM

## 2013-04-20 DIAGNOSIS — N390 Urinary tract infection, site not specified: Secondary | ICD-10-CM

## 2013-04-20 LAB — CBC WITH DIFFERENTIAL/PLATELET
Basophils Relative: 0 % (ref 0–1)
Eosinophils Absolute: 0 10*3/uL (ref 0.0–0.7)
Eosinophils Relative: 0 % (ref 0–5)
HCT: 32.2 % — ABNORMAL LOW (ref 36.0–46.0)
Hemoglobin: 10.8 g/dL — ABNORMAL LOW (ref 12.0–15.0)
Lymphocytes Relative: 13 % (ref 12–46)
MCH: 32 pg (ref 26.0–34.0)
MCHC: 33.5 g/dL (ref 30.0–36.0)
MCV: 95.3 fL (ref 78.0–100.0)
Monocytes Absolute: 1.3 10*3/uL — ABNORMAL HIGH (ref 0.1–1.0)
Monocytes Relative: 9 % (ref 3–12)
RBC: 3.38 MIL/uL — ABNORMAL LOW (ref 3.87–5.11)

## 2013-04-20 LAB — COMPREHENSIVE METABOLIC PANEL
Albumin: 3.2 g/dL — ABNORMAL LOW (ref 3.5–5.2)
BUN: 19 mg/dL (ref 6–23)
Creatinine, Ser: 0.69 mg/dL (ref 0.50–1.10)
GFR calc Af Amer: >90 mL/min (ref >90)
Total Protein: 6.8 g/dL (ref 6.0–8.3)

## 2013-04-20 MED ORDER — CIPROFLOXACIN HCL 250 MG PO TABS
250.0000 mg | ORAL_TABLET | Freq: Two times a day (BID) | ORAL | Status: DC
  Administered 2013-04-20 – 2013-04-26 (×13): 250 mg via ORAL
  Filled 2013-04-20 (×15): qty 1

## 2013-04-20 NOTE — Progress Notes (Signed)
Patient information reviewed and entered into eRehab system by Allecia Bells, RN, CRRN, PPS Coordinator.  Information including medical coding and functional independence measure will be reviewed and updated through discharge.     Per nursing patient was given "Data Collection Information Summary for Patients in Inpatient Rehabilitation Facilities with attached "Privacy Act Statement-Health Care Records" upon admission.  

## 2013-04-20 NOTE — Progress Notes (Signed)
Physical Therapy Session Note  Patient Details  Name: [name] MRN: [id number] Date of Birth: 1945-08-08  Today's Date: 04/20/2013 Time: 1300-1330 Time Calculation (min): 30 min  Short Term Goals: Week 1:  PT Short Term Goal 1 (Week 1): Pt will be able to transfer with S with LRAD PT Short Term Goal 2 (Week 1): Pt will be able to gait with LRAD x 150' with S PT Short Term Goal 3 (Week 1): Pt will be able to demonstrate dynamic balance during functional tasks with S PT Short Term Goal 4 (Week 1): Pt will be able to go up/down curb step with min A  Skilled Therapeutic Interventions/Progress Updates:   Pt donned LSO EOB independently and transferred with RW with min A. Session focused on gait training with RW on unit (all the way to the gym!) with extra time and cues for posture, step length with overall steady to min A. Started on Nustep on level 3 x 5 for endurance and strengthening and wanted to continue to work until next therapist came with supervision.  Therapy Documentation Precautions:  Precautions Precautions: Back;Fall Precaution Booklet Issued: No Precaution Comments: Patient able to verbalize 3/3 back precautions with min verbal cues Required Braces or Orthoses: Spinal Brace Spinal Brace: Lumbar corset;Applied in sitting position Other Brace/Splint: AFO Lt foot Restrictions Weight Bearing Restrictions: No  Pain: Denies pain.  See FIM for current functional status  Therapy/Group: Individual Therapy  Karolee Stamps [facility] 04/20/2013, 1:42 PM

## 2013-04-20 NOTE — Progress Notes (Signed)
Subjective/Complaints: Complains of urinary frequency and incontinence. Pain under control A 12 point review of systems has been performed and if not noted above is otherwise negative.   Objective: Vital Signs: Blood pressure 125/68, pulse 68, temperature 98.2 F (36.8 C), temperature source Oral, resp. rate 19, SpO2 95.00%. No results found. No results found for this basename: WBC, HGB, HCT, PLT,  in the last 72 hours No results found for this basename: NA, K, CL, CO, GLUCOSE, BUN, CREATININE, CALCIUM,  in the last 72 hours CBG (last 3)  No results found for this basename: GLUCAP,  in the last 72 hours  Wt Readings from Last 3 Encounters:  04/13/13 87.5 kg (192 lb 14.4 oz)  04/13/13 87.5 kg (192 lb 14.4 oz)  03/31/13 87.454 kg (192 lb 12.8 oz)    Physical Exam:  General: Alert and oriented x 3, No apparent distress . She made good eye contact with examiner. She did have some difficulty recalling the past few days but long-term memory was intact  HEENT: Head is normocephalic, atraumatic, PERRLA, EOMI, sclera anicteric, oral mucosa pink and moist, dentition intact, ext ear canals clear,  Neck: Supple without JVD or lymphadenopathy  Heart: Reg rate and rhythm. No murmurs rubs or gallops  Chest: CTA bilaterally without wheezes, rales, or rhonchi; no distress  Abdomen: Soft, non-tender, non-distended, bowel sounds positive.  Extremities: No clubbing, cyanosis, or edema. Pulses are 2+  Skin: Clean and intact without signs of breakdown and Steri-Strips in place. No drainage  Neuro: Mild LLE weakness, heel cord contracture, mild left deltoid weakness, some pain inhibition. Otherwise strength was 5 RUE, 4+ LUE, 3-4/5 RLE and 3/5 LLE prox greater than distal. No consistent sensory findings. Improve mentation today although still a little disconnected at times. No focal CN abnormalities.  Musculoskeletal: back expectedly tender.  Psych: Pt's affect is appropriate. Pt is cooperative with  exam   Assessment/Plan: 1. Functional deficits secondary to lumbar DDD with stenosis and radiculopathy s/p L3-5 fusion/lami which require 3+ hours per day of interdisciplinary therapy in a comprehensive inpatient rehab setting. Physiatrist is providing close team supervision and 24 hour management of active medical problems listed below. Physiatrist and rehab team continue to assess barriers to discharge/monitor patient progress toward functional and medical goals. FIM:                   Comprehension Comprehension Mode: Auditory Comprehension: 6-Follows complex conversation/direction: With extra time/assistive device  Expression Expression Mode: Verbal Expression: 6-Expresses complex ideas: With extra time/assistive device  Social Interaction Social Interaction: 6-Interacts appropriately with others with medication or extra time (anti-anxiety, antidepressant).  Problem Solving Problem Solving: 6-Solves complex problems: With extra time  Memory Memory: 6-More than reasonable amt of time  Medical Problem List and Plan:  1. lumbar DDD with stenosis and radiculopathy. Status post L3-5 fusion, laminectomy, prior brain tumor resection   -decadron taper 2. DVT Prophylaxis/Anticoagulation: SCDs. Check vascular studies  3. Pain Management: Neurontin 300 mg 3 times a day, Dilaudid 2-4 mg every 4 hours as needed and Robaxin as needed.  Monitor mental status related to narcotics----minimize narcs as possible. Pt understands that she's sensitive to these meds.  4. Mood/depression. Zoloft 50 mg daily, Wellbutrin 200 mg daily. Provide emotional support and  5. Neuropsych: This patient is capable of making decisions on her own behalf.  6. Hypertension. Hydrochlorothiazide 25 mg daily.   7. Hypothyroidism. Synthroid  8. Hyperlipidemia. Lipitor  9. Constipation. Adjust bowel program as needed  10. Uro:  urine malodorous---having incontinence and frequency  -begin empiric cipro with ucx  pending  LOS (Days) 1 A FACE TO FACE EVALUATION WAS PERFORMED  SWARTZ,ZACHARY T 04/20/2013 8:35 AM

## 2013-04-20 NOTE — Evaluation (Signed)
Physical Therapy Assessment and Plan  Patient Details  Name: [name] MRN: [id number] Date of Birth: 07/22/1946  PT Diagnosis: Abnormality of gait, Difficulty walking, Low back pain, Muscle spasms and Muscle weakness Rehab Potential: Good ELOS: 10-12 days   Today's Date: 04/20/2013 Time: 0930-1030 Time Calculation (min): 60 min  Problem List:  Patient Active Problem List   Diagnosis Date Noted  . Lumbar radiculopathy 04/19/2013  . DIARRHEA 09/15/2010  . PERSONAL HISTORY OF FAILED MODERATE SEDATION 09/15/2010  . DIARRHEA-PRESUMED INFECTIOUS 07/24/2010  . HEMORRHOIDS-INTERNAL 12/10/2008  . HEMORRHOIDS-EXTERNAL 12/10/2008  . PERSONAL HX COLONIC POLYPS 12/10/2008  . COLONIC POLYPS, HYPERPLASTIC 11/22/2008  . HYPERLIPIDEMIA 11/22/2008  . ANXIETY 11/22/2008  . DEPRESSION 11/22/2008  . ABNORMAL HEART RHYTHMS 11/22/2008  . HEMORRHOIDS 11/22/2008  . GERD 11/22/2008  . HIATAL HERNIA 11/22/2008  . HYPERTENSION NEC 11/22/2008    Past Medical History:  Past Medical History  Diagnosis Date  . Benign neoplasm of colon   . Depressive disorder, not elsewhere classified   . Anxiety state, unspecified   . Other and unspecified hyperlipidemia   . Esophageal reflux   . Obesity   . Hypothyroidism   . Hemorrhoids   . Colon polyps     hyperplastic  . Family history of colon cancer mother  . Lymphocytic colitis   . Contact lens/glasses fitting     wears glassses or contacts  . Complications affecting other specified body systems, hypertension     No cardiologist   . Seizures     x 1  . Brain tumor    Past Surgical History:  Past Surgical History  Procedure Laterality Date  . Craniotomy    . Back surgery    . Abdominal hysterectomy    . Appendectomy    . Carpal tunnel release      right  . Brain surgery  1999    benign tumor  . Hammertoe reconstruction with weil osteotomy  07/07/2012    Procedure: HAMMERTOE RECONSTRUCTION WITH WEIL OSTEOTOMY;  Surgeon: Toni Arthurs, MD;   Location: [facility];  Service: Orthopedics;  Laterality: Left;  Left Second through Fourth Hammertoe Correction; Left Fifth Toe Flexor Tendon Release, and Left Second through Fourth Weil Osteotomy    Assessment & Plan Clinical Impression: Patient is a 67 y.o. 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 Admitted 04/12/2013 with progressive low back pain radiating to her extremities. X-rays and imaging revealed degenerative disc disease as well as lumbar L3-4 central disc protrusion with stenosis/radiculopathy. Underwent bilateral L3, 4, 5 laminectomy facetectomy was interbody fusion with pedicle screws 04/13/2013 per Dr. Jeral Fruit. Postoperative pain management with Dilaudid as well as Decadron tapered to 2 mg every 6 hours 04/19/2013.. Back brace when out of bed. There was some reported postoperative confusion and suicidal ideations. Patient was placed on suicide watch with psychiatry followup and her Wellbutrin was increased to 200 mg daily also advised to continue Zoloft. Her mental status continued to improve suicide watch was discontinued. Physical and occupational therapy evaluations completed with recommendations for physical medicine rehabilitation consult to consider inpatient rehabilitation services.. Patient was felt to be a good candidate for inpatient rehabilitation services and was admitted for a comprehensive rehabilitation program on 04/19/2013 .   Patient currently requires min to mod  with mobility secondary to muscle weakness and muscle joint  tightness, decreased cardiorespiratoy endurance, unbalanced muscle activation and decreased standing balance, decreased balance strategies and difficulty maintaining precautions.  Prior to hospitalization, patient was modified independent  with  mobility and lived with Spouse in a House home.  Home access is level entry into the basement area; 3 (1 + 1) steps to enter front entranceStairs to enter;Level entry (threshold to garage; 1 step up x 2 -3 for other entrance ).  Patient will benefit from skilled PT intervention to maximize safe functional mobility, minimize fall risk and decrease caregiver burden for planned discharge home with 24 hour supervision.  Anticipate patient will HHPT v OPPT at discharge.  PT - End of Session Endurance Deficit: Yes Endurance Deficit Description: Overall weakness PT Assessment Rehab Potential: Good PT Patient demonstrates impairments in the following area(s): Balance;Endurance;Motor;Pain;Skin Integrity PT Transfers Functional Problem(s): Bed Mobility;Bed to Chair;Car;Furniture;Floor PT Locomotion Functional Problem(s): Ambulation;Stairs PT Plan PT Intensity: Minimum of 1-2 x/day ,45 to 90 minutes PT Frequency: 5 out of 7 days PT Duration Estimated Length of Stay: 10-12 days PT Treatment/Interventions: Ambulation/gait training;Balance/vestibular training;Community reintegration;Discharge planning;Disease management/prevention;DME/adaptive equipment instruction;Functional mobility training;Neuromuscular re-education;Pain management;Patient/family education;Psychosocial support;Skin care/wound management;Splinting/orthotics;Stair training;Therapeutic Activities;Therapeutic Exercise;UE/LE Strength taining/ROM;UE/LE Coordination activities;Wheelchair propulsion/positioning PT Transfers Anticipated Outcome(s): mod I basic transfers; S car PT Locomotion Anticipated Outcome(s): mod I gait; S stairs PT Recommendation Follow Up Recommendations:  (HHPT V OPPT) Patient destination: Home Equipment Recommended: Rolling walker with 5" wheels  Skilled Therapeutic Intervention Individual treatment focused on basic transfers with and without AD, gait training with RW with focus on posture, contraction at knee on LLE  due to instability at knee, stair negotiation, and balance. Pt demonstrated 3 episodes of L knee buckling during mobility requiring mod to max A to recover (most assist needed when knee buckled on stairs).   PT Evaluation Precautions/Restrictions Precautions Precautions: Back;Fall Precaution Booklet Issued: No Precaution Comments: Patient able to verbalize 3/3 back precautions with min verbal cues Required Braces or Orthoses: Spinal Brace Spinal Brace: Lumbar corset;Applied in sitting position Other Brace/Splint: AFO Lt foot Restrictions Weight Bearing Restrictions: No Pain Reports generalized back pain and L ankle feeling tight - no intervention needed at this time per pt. Home Living/Prior Functioning Home Living Available Help at Discharge: Family Type of Home: House Home Access: Stairs to enter;Level entry (threshold to garage; 1 step up x 2 -3 for other entrance ) Entrance Stairs-Number of Steps: level entry into the basement area; 3 (1 + 1) steps to enter front entrance Entrance Stairs-Rails: Can reach both Home Layout: One level;Able to live on main level with bedroom/bathroom Additional Comments: Patient states she will live in the "basement" where her husband works; in the basement there is a full bath, bedroom, and small kitchen  Lives With: Spouse Prior Function Level of Independence: Independent with basic ADLs;Independent with homemaking with ambulation;Independent with gait;Independent with transfers  Able to Take Stairs?: Yes Driving: Yes Vocation: Retired Optometrist - History Baseline Vision: Wears contacts Patient Visual Report: No change from baseline Vision - Assessment Eye Alignment: Within Functional Limits Vision Assessment: Vision not tested Perception Perception: Within Functional Limits Praxis Praxis: Intact  Cognition Overall Cognitive Status: Within Functional Limits for tasks assessed Orientation Level: Oriented X4 Memory: Appears  intact Awareness: Appears intact Safety/Judgment: Appears intact Sensation Sensation Light Touch: Appears Intact Stereognosis: Appears Intact (BUEs) Hot/Cold: Appears Intact (BUEs) Proprioception: Appears Intact (BUEs) Coordination Gross Motor Movements are Fluid and Coordinated: Yes Fine Motor Movements are Fluid and Coordinated: Yes (BUEs) Motor  Motor Motor:  (  generalized weakness; decreased control/strength in L knee)  Locomotion  Ambulation Ambulation/Gait Assistance: 4: Min assist  Trunk/Postural Assessment  Cervical Assessment Cervical Assessment: Within Functional Limits Thoracic Assessment Thoracic Assessment: Exceptions to [facility] (back precautions; lumbar corset) Lumbar Assessment Lumbar Assessment: Exceptions to [facility] (back precautions; lumbar corset) Postural Control Postural Control: Within Functional Limits  Balance Static Sitting Balance Static Sitting - Level of Assistance: 6: Modified independent (Device/Increase time) Static Standing Balance Static Standing - Level of Assistance: 4: Min assist (without AD) Dynamic Standing Balance Dynamic Standing - Level of Assistance: 3: Mod assist (without UE support; min A wit RW) Extremity Assessment  RUE Assessment RUE Assessment: Within Functional Limits (can benefit from BUE strengthening) LUE Assessment LUE Assessment: Within Functional Limits (can benefit from BUE strengthening) RLE Assessment RLE Assessment: Within Functional Limits (decreased muscular endurance) LLE Assessment LLE Assessment: Exceptions to [facility] LLE Strength LLE Overall Strength Comments: grossly 3+/5; decreased ability to maintain contraction at knee; episodes of buckling noted  FIM:  FIM - Bed/Chair Transfer Bed/Chair Transfer Assistive Devices: Bed rails;HOB elevated Bed/Chair Transfer: 3: Chair or W/C > Bed: Mod A (lift or lower assist) FIM - Locomotion: Wheelchair Locomotion: Wheelchair: 1: Total Assistance/staff pushes wheelchair  (Pt<25%) (gait to be primary means) FIM - Locomotion: Ambulation Locomotion: Ambulation Assistive Devices: Walker - Rolling;Orthosis Ambulation/Gait Assistance: 4: Min assist Locomotion: Ambulation: 2: Travels 50 - 149 ft with minimal assistance (Pt.>75%) FIM - Locomotion: Stairs Locomotion: Building control surveyor: Hand rail - 2 Locomotion: Stairs: 1: Up and Down < 4 stairs with maximal assistance (Pt: 25 - 49%) (max A to recover due to L knee buckling)   Refer to Care Plan for Long Term Goals  Recommendations for other services: None  Discharge Criteria: Patient will be discharged from PT if patient refuses treatment 3 consecutive times without medical reason, if treatment goals not met, if there is a change in medical status, if patient makes no progress towards goals or if patient is discharged from hospital.  The above assessment, treatment plan, treatment alternatives and goals were discussed and mutually agreed upon: by patient  Tedd Sias 04/20/2013, 10:47 AM

## 2013-04-20 NOTE — Evaluation (Signed)
Occupational Therapy Assessment and Plan & Session Notes  Patient Details  Name: [name] MRN: [id number] Date of Birth: 04-Dec-1945  OT Diagnosis: abnormal posture, acute pain, lumbago (low back pain) and muscle weakness (generalized) Rehab Potential: Rehab Potential: Excellent ELOS: 10-14 days   Today's Date: 04/20/2013 Time: 0930-1030 Time Calculation (min): 60 min  Problem List:  Patient Active Problem List   Diagnosis Date Noted  . Lumbar radiculopathy 04/19/2013  . DIARRHEA 09/15/2010  . PERSONAL HISTORY OF FAILED MODERATE SEDATION 09/15/2010  . DIARRHEA-PRESUMED INFECTIOUS 07/24/2010  . HEMORRHOIDS-INTERNAL 12/10/2008  . HEMORRHOIDS-EXTERNAL 12/10/2008  . PERSONAL HX COLONIC POLYPS 12/10/2008  . COLONIC POLYPS, HYPERPLASTIC 11/22/2008  . HYPERLIPIDEMIA 11/22/2008  . ANXIETY 11/22/2008  . DEPRESSION 11/22/2008  . ABNORMAL HEART RHYTHMS 11/22/2008  . HEMORRHOIDS 11/22/2008  . GERD 11/22/2008  . HIATAL HERNIA 11/22/2008  . HYPERTENSION NEC 11/22/2008    Past Medical History:  Past Medical History  Diagnosis Date  . Benign neoplasm of colon   . Depressive disorder, not elsewhere classified   . Anxiety state, unspecified   . Other and unspecified hyperlipidemia   . Esophageal reflux   . Obesity   . Hypothyroidism   . Hemorrhoids   . Colon polyps     hyperplastic  . Family history of colon cancer mother  . Lymphocytic colitis   . Contact lens/glasses fitting     wears glassses or contacts  . Complications affecting other specified body systems, hypertension     No cardiologist   . Seizures     x 1  . Brain tumor    Past Surgical History:  Past Surgical History  Procedure Laterality Date  . Craniotomy    . Back surgery    . Abdominal hysterectomy    . Appendectomy    . Carpal tunnel release      right  . Brain surgery  1999    benign tumor  . Hammertoe reconstruction with weil osteotomy  07/07/2012    Procedure: HAMMERTOE RECONSTRUCTION WITH  WEIL OSTEOTOMY;  Surgeon: Toni Arthurs, MD;  Location: [facility];  Service: Orthopedics;  Laterality: Left;  Left Second through Fourth Hammertoe Correction; Left Fifth Toe Flexor Tendon Release, and Left Second through Fourth Weil Osteotomy   Clinical Impression: [name] is a 67 y.o. 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 Admitted 04/12/2013 with progressive low back pain radiating to her extremities. X-rays and imaging revealed degenerative disc disease as well as lumbar L3-4 central disc protrusion with stenosis/radiculopathy. Underwent bilateral L3, 4, 5 laminectomy facetectomy was interbody fusion with pedicle screws 04/13/2013 per Dr. Jeral Fruit. Postoperative pain management with Dilaudid as well as Decadron tapered to 2 mg every 6 hours 04/19/2013.. Back brace when out of bed. There was some reported postoperative confusion and suicidal ideations. Patient was placed on suicide watch with psychiatry followup and her Wellbutrin was increased to 200 mg daily also advised to continue Zoloft. Her mental status continued to improve suicide watch was discontinued. Physical and occupational therapy evaluations completed with recommendations for physical medicine rehabilitation consult to consider inpatient rehabilitation services.. Patient was felt to be a good candidate for inpatient rehabilitation services and was admitted for a comprehensive rehabilitation program. Patient transferred to CIR on 04/19/2013 .    Patient currently requires min-total  assist with basic self-care skills and IADL secondary to  muscle weakness, muscle joint tightness and muscle paralysis and decreased sitting balance, decreased standing balance, decreased postural control, decreased balance strategies and difficulty maintaining precautions.  Prior  to hospitalization, patient could complete ADLs and IADLs independently; with AFO-> RLE.  Patient will benefit from skilled intervention to increase independence with basic self-care skills prior to discharge home with care partner.  Anticipate patient will require intermittent supervision and additional OT is TBD.  OT - End of Session Activity Tolerance: Tolerates 10 - 20 min activity with multiple rests Endurance Deficit: Yes Endurance Deficit Description: Overall weakness OT Assessment Rehab Potential: Excellent Barriers to Discharge:  (none known at this time) OT Patient demonstrates impairments in the following area(s): Balance;Edema;Endurance;Motor;Pain;Safety;Sensory;Skin Integrity OT Basic ADL's Functional Problem(s): Grooming;Dressing;Bathing;Toileting OT Advanced ADL's Functional Problem(s): Light Housekeeping OT Transfers Functional Problem(s): Toilet;Tub/Shower OT Additional Impairment(s): None (can benefit from BUE strengthening) OT Plan OT Intensity: Minimum of 1-2 x/day, 45 to 90 minutes OT Frequency: 5 out of 7 days OT Duration/Estimated Length of Stay: 10-14 days OT Treatment/Interventions: Balance/vestibular training;Community reintegration;Discharge planning;DME/adaptive equipment instruction;Functional mobility training;Pain management;Patient/family education;Psychosocial support;Self Care/advanced ADL retraining;Skin care/wound managment;Splinting/orthotics;Therapeutic Activities;Therapeutic Exercise;UE/LE Strength taining/ROM;UE/LE Coordination activities;Wheelchair propulsion/positioning OT Self Feeding Anticipated Outcome(s): Independent OT Basic Self-Care Anticipated Outcome(s): Set-up -> mod I OT Toileting Anticipated Outcome(s): mod I OT Bathroom Transfers Anticipated Outcome(s): mod I OT Recommendation Recommendations for Other Services:  (none at this time) Patient destination: Home Follow Up Recommendations:  (additional OT is TBD) Equipment Recommended: 3  in 1 bedside comode;Tub/shower bench  Precautions/Restrictions  Precautions Precautions: Back;Fall Precaution Booklet Issued: No Precaution Comments: Patient able to verbalize 3/3 back precautions with min verbal cues Required Braces or Orthoses: Spinal Brace Spinal Brace: Lumbar corset;Applied in sitting position Other Brace/Splint: AFO Lt foot Restrictions Weight Bearing Restrictions: No  General Chart Reviewed: Yes Family/Caregiver Present: No  Pain Pain Assessment Pain Assessment: No/denies pain ("my back feels a little tired") Pain Score: 0-No pain  Home Living/Prior Functioning Home Living Available Help at Discharge: Family (Husband works out of home) Type of Home: House Home Access: Level entry;Stairs to enter [employer] of Steps: level entry into the basement area; 3 steps to enter front entrance Entrance Stairs-Rails: Can reach both (main entrance) Home Layout: One level;Able to live on main level with bedroom/bathroom Additional Comments: Patient states she will live in the "basement" where her husband works; in the basement there is a full bath, bedroom, and small kitchen  Lives With: Spouse IADL History Homemaking Responsibilities: No Current License: Yes 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 Leisure and Hobbies: spend time with grandchildren, travel to [place], [place] to watch shows Prior Function Level of Independence: Independent with basic ADLs;Independent with homemaking with ambulation;Independent with gait;Independent with transfers  Able to Take Stairs?: Yes Driving: Yes Vocation: Retired  ADL - See FIM  Vision/Perception  Vision - History Baseline Vision: Wears contacts Patient Visual Report: No change from baseline Vision - Assessment Eye Alignment: Within Functional Limits Vision Assessment: Vision not tested Perception Perception: Within Functional Limits Praxis Praxis: Intact    Cognition Overall Cognitive Status: Within Functional Limits for tasks assessed Orientation Level: Oriented X4 Memory: Appears intact Awareness: Appears intact Safety/Judgment: Appears intact  Sensation Sensation Light Touch: Appears Intact Stereognosis: Appears Intact (BUEs) Hot/Cold: Appears Intact (BUEs) Proprioception: Appears Intact (BUEs) Coordination Gross Motor Movements are Fluid and Coordinated: Yes Fine Motor Movements are Fluid and Coordinated: Yes (BUEs)  Motor  Motor Motor:  (generalized weakness; decreased control/strength in L knee)  Trunk/Postural Assessment  Cervical Assessment Cervical Assessment: Within Functional Limits Thoracic Assessment Thoracic Assessment: Exceptions to [facility] (back precautions; lumbar corset) Lumbar Assessment Lumbar Assessment: Exceptions to [facility] (back precautions; lumbar corset) Postural Control Postural Control: Within Functional Limits   Balance Static Sitting Balance Static Sitting - Level of Assistance: 6: Modified independent (Device/Increase time) Static Standing Balance Static Standing - Level of Assistance: 4: Min assist (without AD) Dynamic Standing Balance Dynamic Standing - Level of Assistance: 3: Mod assist (without UE support; min A wit RW)  Extremity/Trunk Assessment RUE Assessment RUE Assessment: Within Functional Limits (can benefit from BUE strengthening) LUE Assessment LUE Assessment: Within Functional Limits (can benefit from BUE strengthening)  FIM:  FIM - Eating Eating Activity: 7: Complete independence:no helper FIM - Grooming Grooming Steps: Wash, rinse, dry face;Wash, rinse, dry hands;Oral care, brush teeth, clean dentures;Brush, comb hair Grooming: 5: Set-up assist to obtain items FIM - Bathing Bathing Steps Patient Completed: Chest;Right Arm;Left Arm;Abdomen Bathing: 2: Max-Patient completes 3-4 21f 10 parts or 25-49% FIM - Upper Body Dressing/Undressing Upper body dressing/undressing steps  patient completed: Thread/unthread right bra strap;Thread/unthread left bra strap;Thread/unthread right sleeve of pullover shirt/dresss;Thread/unthread left sleeve of pullover shirt/dress;Put head through opening of pull over shirt/dress;Pull shirt over trunk Upper body dressing/undressing: 4: Min-Patient completed 75 plus % of tasks FIM - Lower Body Dressing/Undressing Lower body dressing/undressing: 1: Total-Patient completed less than 25% of tasks FIM - Press photographer Assistive Devices: Bed rails;HOB elevated Bed/Chair Transfer: 3: Chair or W/C > Bed: Mod A (lift or lower assist)   Refer to Care Plan for Long Term Goals  Recommendations for other services: None at this time.  Discharge Criteria: Patient will be discharged from OT if patient refuses treatment 3 consecutive times without medical reason, if treatment goals not met, if there is a change in medical status, if patient makes no progress towards goals or if patient is discharged from hospital.  The above assessment, treatment plan, treatment alternatives and goals were discussed and mutually agreed upon: by patient  --------------------------------------------------------------------------------------------------------------------------------  SESSION NOTES  Session #1 [phone] - 55 Minutes Individual Therapy No complaints of pain Initial 1:1 occupational therapy evaluation completed. Focused skilled intervention on bed mobility, UB/LB bathing & dressing seated edge of bed, sit<>stands, dynamic standing balance/tolerance/endurance, edge of bed -> w/c transfers, grooming tasks seated at sink in w/c, and overall activity tolerance/endurance. Patient seems motivated to be as independent as possible. At end of session, left patient seated in w/c beside bed with call bell & phone within reach.   Session #2 [id number] - 45 Minutes Individual Therapy No complaints of pain Patient found on NuStep machine.  Patient stood from NuStep then ambulated -> ADL apartment for focus on bed mobility, toilet transfer, shower stall transfer using blue simulated block, functional ambulation/mobility, sit <>stands, and overall activity tolerance/endurance. Patient then ambulated -> gym and therapist administered elastic shoe strings to increase independence with LB dressing. Therapist propelled patient back to room and left patient seated in w/c with call bell & phone within reach.   Maylie Ashton 04/20/2013, 11:07 AM

## 2013-04-21 ENCOUNTER — Inpatient Hospital Stay (HOSPITAL_COMMUNITY): Payer: BC Managed Care – PPO | Admitting: Occupational Therapy

## 2013-04-21 ENCOUNTER — Inpatient Hospital Stay (HOSPITAL_COMMUNITY): Payer: BC Managed Care – PPO

## 2013-04-21 LAB — URINE CULTURE: Colony Count: >=100000

## 2013-04-21 MED ORDER — OXYBUTYNIN CHLORIDE 5 MG PO TABS
5.0000 mg | ORAL_TABLET | Freq: Every day | ORAL | Status: DC
  Administered 2013-04-21 – 2013-04-24 (×4): 5 mg via ORAL
  Filled 2013-04-21 (×5): qty 1

## 2013-04-21 NOTE — Progress Notes (Signed)
Occupational Therapy Session Notes  Patient Details  Name: [name] MRN: [id number] Date of Birth: 20-Sep-1945  Today's Date: 04/21/2013  Short Term Goals: Week 1:  OT Short Term Goal 1 (Week 1): Patient will complete UB/LB bathing at shower level with minimal assistance OT Short Term Goal 2 (Week 1): Patient will complete shower stall transfer with minimal assistance OT Short Term Goal 3 (Week 1): Patient will complete LB dressing with moderate assistance OT Short Term Goal 4 (Week 1): Patient will complete toilet transfer with minimal assistance  Skilled Therapeutic Interventions/Progress Updates:   Session #1 [phone] - 70 Minutes Individual Therapy No complaints of pain Patient found seated in w/c upon entering room. Patient stood with rolling walker, needing min verbal cues to lock w/c brakes. Patient ambulated into bathroom with rolling walker and performed shower stall transfer with minimal assistance onto tub transfer bench. UB/LB bathing completed from here at a supervision/set-up level with use of long handled sponge to increase independence with LB bathing. Patient then ambulated from shower-> w/c with minimal assistance and performed UB/LB dressing in sit<>stand position using reacher, sock aid, and long handled shoe horn prn. Focused skilled intervention on functional ambulation/mobility using RW, functional transfers, sit<>stands, dynamic standing balance/tolerance/endurance, overall activity tolerance/endurance, use of AE prn to increase independence with LB ADLs. Patient ambulated -> dresser drawer to gather some grooming items and patient then sat in w/c at sink to complete grooming tasks. At end of session, left patient seated in w/c with call bell & phone within reach.   Session #2 1305-1330 - 25 Minutes Individual Therapy No complaints of pain Patient found seated in w/c talking with SW. Therapist propelled patient -> therapy gym and patient engaged in BUE  strengthening exercises using ergometer in standing for 10 minutes. Patient's LLE started to buckle and became weak the longer she stood. Therapist assisted patient back to room and left patient seated in w/c with call bell & phone within reach.   Precautions:  Precautions Precautions: Back;Fall Precaution Booklet Issued: No Precaution Comments: Patient able to verbalize 3/3 back precautions with min verbal cues Required Braces or Orthoses: Spinal Brace Spinal Brace: Lumbar corset;Applied in sitting position Other Brace/Splint: AFO Lt foot Restrictions Weight Bearing Restrictions: No  See FIM for current functional status  Lesli Issa 04/21/2013, 7:31 AM

## 2013-04-21 NOTE — Progress Notes (Signed)
Subjective/Complaints: Still having urinary frequency especially at night. Pain overall well controlled. Did well with therapy although she still feels that her left leg wants to give out.  A 12 point review of systems has been performed and if not noted above is otherwise negative.   Objective: Vital Signs: Blood pressure 128/70, pulse 68, temperature 98.2 F (36.8 C), temperature source Oral, resp. rate 18, SpO2 96.00%. No results found.  Recent Labs  04/20/13 0915  WBC 14.6*  HGB 10.8*  HCT 32.2*  PLT 435*    Recent Labs  04/20/13 0915  NA 134*  K 3.9  CL 100  GLUCOSE 128*  BUN 19  CREATININE 0.69  CALCIUM 11.2*   CBG (last 3)  No results found for this basename: GLUCAP,  in the last 72 hours  Wt Readings from Last 3 Encounters:  04/13/13 87.5 kg (192 lb 14.4 oz)  04/13/13 87.5 kg (192 lb 14.4 oz)  03/31/13 87.454 kg (192 lb 12.8 oz)    Physical Exam:  General: Alert and oriented x 3, No apparent distress . She made good eye contact with examiner. She did have some difficulty recalling the past few days but long-term memory was intact  HEENT: Head is normocephalic, atraumatic, PERRLA, EOMI, sclera anicteric, oral mucosa pink and moist, dentition intact, ext ear canals clear,  Neck: Supple without JVD or lymphadenopathy  Heart: Reg rate and rhythm. No murmurs rubs or gallops  Chest: CTA bilaterally without wheezes, rales, or rhonchi; no distress  Abdomen: Soft, non-tender, non-distended, bowel sounds positive.  Extremities: No clubbing, cyanosis, or edema. Pulses are 2+  Skin: Clean and intact without signs of breakdown and Steri-Strips in place. No drainage  Neuro: Mild LLE weakness, heel cord contracture, mild left deltoid weakness, some pain inhibition. Otherwise strength was 5 RUE, 4+ LUE, 3-4/5 RLE and 3/5 LLE prox greater than distal. No consistent sensory findings. Improve mentation today although still a little disconnected at times. No focal CN  abnormalities.  Musculoskeletal: back expectedly tender.  Psych: Pt's affect is appropriate. Pt is cooperative with exam   Assessment/Plan: 1. Functional deficits secondary to lumbar DDD with stenosis and radiculopathy s/p L3-5 fusion/lami which require 3+ hours per day of interdisciplinary therapy in a comprehensive inpatient rehab setting. Physiatrist is providing close team supervision and 24 hour management of active medical problems listed below. Physiatrist and rehab team continue to assess barriers to discharge/monitor patient progress toward functional and medical goals. FIM: FIM - Bathing Bathing Steps Patient Completed: Chest;Right Arm;Left Arm;Abdomen Bathing: 2: Max-Patient completes 3-4 76f 10 parts or 25-49%  FIM - Upper Body Dressing/Undressing Upper body dressing/undressing steps patient completed: Thread/unthread right bra strap;Thread/unthread left bra strap;Thread/unthread right sleeve of pullover shirt/dresss;Thread/unthread left sleeve of pullover shirt/dress;Put head through opening of pull over shirt/dress;Pull shirt over trunk Upper body dressing/undressing: 4: Min-Patient completed 75 plus % of tasks FIM - Lower Body Dressing/Undressing Lower body dressing/undressing: 1: Total-Patient completed less than 25% of tasks     FIM - Diplomatic Services operational officer Devices: Elevated toilet seat;Walker;Grab bars Toilet Transfers: 4-To toilet/BSC: Min A (steadying Pt. > 75%);4-From toilet/BSC: Min A (steadying Pt. > 75%)  FIM - Bed/Chair Transfer Bed/Chair Transfer Assistive Devices: Bed rails;HOB elevated Bed/Chair Transfer: 3: Chair or W/C > Bed: Mod A (lift or lower assist)  FIM - Locomotion: Wheelchair Locomotion: Wheelchair: 1: Total Assistance/staff pushes wheelchair (Pt<25%) (gait to be primary means) FIM - Locomotion: Ambulation Locomotion: Ambulation Assistive Devices: Walker - Rolling;Orthosis Ambulation/Gait Assistance: 4: Min  assist Locomotion: Ambulation: 2: Travels 50 - 149 ft with minimal assistance (Pt.>75%)  Comprehension Comprehension Mode: Auditory Comprehension: 6-Follows complex conversation/direction: With extra time/assistive device  Expression Expression Mode: Verbal Expression: 6-Expresses complex ideas: With extra time/assistive device  Social Interaction Social Interaction: 6-Interacts appropriately with others with medication or extra time (anti-anxiety, antidepressant). (medications)  Problem Solving Problem Solving: 6-Solves complex problems: With extra time  Memory Memory: 6-More than reasonable amt of time  Medical Problem List and Plan:  1. lumbar DDD with stenosis and radiculopathy. Status post L3-5 fusion, laminectomy, prior brain tumor resection   -decadron taper 2. DVT Prophylaxis/Anticoagulation: SCDs. Check vascular studies  3. Pain Management: Neurontin 300 mg 3 times a day, Dilaudid 2-4 mg every 4 hours as needed and Robaxin as needed.  Good balance at present.  4. Mood/depression. Zoloft 50 mg daily, Wellbutrin 200 mg daily.  5. Neuropsych: This patient is capable of making decisions on her own behalf.  6. Hypertension. Hydrochlorothiazide 25 mg daily.   7. Hypothyroidism. Synthroid  8. Hyperlipidemia. Lipitor  9. Constipation. Adjust bowel program as needed  10. Uro: urine malodorous---having incontinence and frequency  -100k e coli, cx sensitivity pending  -will add low dose ditropan temporarily at night to help with some of her frequency   LOS (Days) 2 A FACE TO FACE EVALUATION WAS PERFORMED  [name] T 04/21/2013 8:22 AM

## 2013-04-21 NOTE — Progress Notes (Signed)
Patient ID: [name], female   DOB: [birth year], 67 y.o.   MRN: [id number] Much better. Decrease of pain with increase of activity

## 2013-04-21 NOTE — IPOC Note (Signed)
Overall Plan of Care [facility]) Patient Details Name: [name] MRN: [id number] DOB: [birth year]  Admitting Diagnosis: lumbar fusion  Hospital Problems: Principal Problem:   Lumbar radiculopathy     Functional Problem List: Nursing Bladder;Bowel;Endurance;Medication Management;Pain;Safety;Skin Integrity;Behavior  PT Balance;Endurance;Motor;Pain;Skin Integrity  OT Balance;Edema;Endurance;Motor;Pain;Safety;Sensory;Skin Integrity  SLP    TR         Basic ADL's: OT Grooming;Dressing;Bathing;Toileting     Advanced  ADL's: OT Light Housekeeping     Transfers: PT Bed Mobility;Bed to Chair;Car;Furniture;Floor  OT Toilet;Tub/Shower     Locomotion: PT Ambulation;Stairs     Additional Impairments: OT None (can benefit from BUE strengthening)  SLP        TR      Anticipated Outcomes Item Anticipated Outcome  Self Feeding Independent  Swallowing      Basic self-care  Set-up -> mod I  Toileting  mod I   Bathroom Transfers mod I  Bowel/Bladder  patient remain continent of bowel and  bladder with timed toileting  Transfers  mod I basic transfers; S car  Locomotion  mod I gait; S stairs  Communication     Cognition     Pain  3 or less on scale 0-10  Safety/Judgment  Min assist   Therapy Plan: PT Intensity: Minimum of 1-2 x/day ,45 to 90 minutes PT Frequency: 5 out of 7 days PT Duration Estimated Length of Stay: 10-12 days OT Intensity: Minimum of 1-2 x/day, 45 to 90 minutes OT Frequency: 5 out of 7 days OT Duration/Estimated Length of Stay: 10-14 days         Team Interventions: Nursing Interventions Bladder Management;Bowel Management;Patient/Family Education;Medication Management;Skin Care/Wound Management;Cognitive Remediation/Compensation;Pain Management;Discharge Planning;Psychosocial Support  PT interventions Ambulation/gait training;Balance/vestibular training;Community reintegration;Discharge planning;Disease management/prevention;DME/adaptive  equipment instruction;Functional mobility training;Neuromuscular re-education;Pain management;Patient/family education;Psychosocial support;Skin care/wound management;Splinting/orthotics;Stair training;Therapeutic Activities;Therapeutic Exercise;UE/LE Strength taining/ROM;UE/LE Coordination activities;Wheelchair propulsion/positioning  OT Interventions Balance/vestibular training;Community reintegration;Discharge planning;DME/adaptive equipment instruction;Functional mobility training;Pain management;Patient/family education;Psychosocial support;Self Care/advanced ADL retraining;Skin care/wound managment;Splinting/orthotics;Therapeutic Activities;Therapeutic Exercise;UE/LE Strength taining/ROM;UE/LE Coordination activities;Wheelchair propulsion/positioning  SLP Interventions    TR Interventions    SW/CM Interventions      Team Discharge Planning: Destination: PT-Home ,OT- Home , SLP-  Projected Follow-up: PT- (HHPT V OPPT), OT-   (additional OT is TBD), SLP-  Projected Equipment Needs: PT-Rolling walker with 5" wheels, OT- 3 in 1 bedside comode;Tub/shower bench, SLP-  Patient/family involved in discharge planning: PT- Patient,  OT-Patient, SLP-   MD ELOS: 10-12 days Medical Rehab Prognosis:  Excellent Assessment: The patient has been admitted for CIR therapies. The team will be addressing, functional mobility, strength, stamina, balance, safety, adaptive techniques/equipment, self-care, bowel and bladder mgt, patient and caregiver education, pain mgt, back precautions, donning/doffing of TLSO. Goals have been set at mod I to occasional supervision.    Ranelle Oyster, MD, FAAPMR      See Team Conference Notes for weekly updates to the plan of care

## 2013-04-21 NOTE — Progress Notes (Signed)
Physical Therapy Session Note  Patient Details  Name: [name] MRN: [id number] Date of Birth: 01/22/1946  Today's Date: 04/21/2013 Time: 1530-1600 Time Calculation (min): 30 min  Short Term Goals: Week 1:  PT Short Term Goal 1 (Week 1): Pt will be able to transfer with S with LRAD PT Short Term Goal 2 (Week 1): Pt will be able to gait with LRAD x 150' with S PT Short Term Goal 3 (Week 1): Pt will be able to demonstrate dynamic balance during functional tasks with S PT Short Term Goal 4 (Week 1): Pt will be able to go up/down curb step with min A  Skilled Therapeutic Interventions/Progress Updates:   Family education with pt's husband to check him off on bed/chair and toilet transfers to provide necessary S and education on times when L knee buckles to stay close to provide A if needed. Kinetron in standing for strengthening and focus on knee extension on L with cues to decrease support from UE's to challenge pt. Progressed to balance activity in standing on Kinetron while throwing, bouncing, and catching a ball; LOB to R with min/mod A to recover and use of UE's. Gait back to room with focus on normalizing gait pattern for step through instead of step-to pattern and improved cadence noted as well.  Therapy Documentation Precautions:  Precautions Precautions: Back;Fall Precaution Booklet Issued: No Precaution Comments: Patient able to verbalize 3/3 back precautions with min verbal cues Required Braces or Orthoses: Spinal Brace Spinal Brace: Lumbar corset;Applied in sitting position Other Brace/Splint: AFO Lt foot Restrictions Weight Bearing Restrictions: No  Pain:  Denies pain.  See FIM for current functional status  Therapy/Group: Individual Therapy  Karolee Stamps [facility] 04/21/2013, 4:01 PM

## 2013-04-21 NOTE — Progress Notes (Signed)
Physical Therapy Session Note  Patient Details  Name: [name] MRN: [id number] Date of Birth: 1946/05/18  Today's Date: 04/21/2013 Time: 1000-1055 Time Calculation (min): 55 min  Short Term Goals: Week 1:  PT Short Term Goal 1 (Week 1): Pt will be able to transfer with S with LRAD PT Short Term Goal 2 (Week 1): Pt will be able to gait with LRAD x 150' with S PT Short Term Goal 3 (Week 1): Pt will be able to demonstrate dynamic balance during functional tasks with S PT Short Term Goal 4 (Week 1): Pt will be able to go up/down curb step with min A  Skilled Therapeutic Interventions/Progress Updates:    Session focused on w/c mobility for endurance and strengthening, neuro re-ed for balance training and ankle strategies on compliant surface while reaching outside BOS and completing various activities without use of UEs, side stepping and toe taps for balance and functional strengthening without UE support, and gait with RW x 65' with steady A. No episodes of L knee buckling, but cues to contract at quad when single limb WB.  Therapy Documentation Precautions:  Precautions Precautions: Back;Fall Precaution Booklet Issued: No Precaution Comments: Patient able to verbalize 3/3 back precautions with min verbal cues Required Braces or Orthoses: Spinal Brace Spinal Brace: Lumbar corset;Applied in sitting position Other Brace/Splint: AFO Lt foot Restrictions Weight Bearing Restrictions: No Pain: Pain Assessment Pain Assessment: No/denies pain  See FIM for current functional status  Therapy/Group: Individual Therapy  Karolee Stamps [facility] 04/21/2013, 12:07 PM

## 2013-04-22 DIAGNOSIS — D126 Benign neoplasm of colon, unspecified: Secondary | ICD-10-CM

## 2013-04-22 NOTE — Progress Notes (Signed)
Patient ID: [name], female   DOB: 06-07-1946, 67 y.o.   MRN: [id number] Subjective/Complaints: Pain overall well controlled. Did well with therapy, but reports her left leg has anterior shin numbness   Objective: Vital Signs: Blood pressure 131/63, pulse 70, temperature 97.9 F (36.6 C), temperature source Oral, resp. rate 18, SpO2 99.00%. No results found.  Recent Labs  04/20/13 0915  WBC 14.6*  HGB 10.8*  HCT 32.2*  PLT 435*    Recent Labs  04/20/13 0915  NA 134*  K 3.9  CL 100  GLUCOSE 128*  BUN 19  CREATININE 0.69  CALCIUM 11.2*   CBG (last 3)  No results found for this basename: GLUCAP,  in the last 72 hours  Wt Readings from Last 3 Encounters:  04/13/13 87.5 kg (192 lb 14.4 oz)  04/13/13 87.5 kg (192 lb 14.4 oz)  03/31/13 87.454 kg (192 lb 12.8 oz)    Physical Exam:  General: Alert and oriented x 3, No apparent distress . She made good eye contact with examiner.  Neck: Supple without JVD or lymphadenopathy  Heart: Reg rate and rhythm. No murmurs. No edema BLE Chest: CTA bilaterally without wheezes, rales, or rhonchi; no distress  Skin: Clean and intact without signs of breakdown and Steri-Strips in place. No drainage  Neuro: Mild LLE weakness with foot drop brace in place, chronic heel cord contracture. Otherwise strength was 5 RUE, 4+ LUE, 3-4/5 RLE and 3/5 LLE prox greater than distal. No consistent sensory findings.  No apparent CN abnormalities.  Musculoskeletal: back expectedly tender.  Psych: Pt's affect is appropriate. Pt is cooperative with exam   Assessment/Plan: Functional deficits secondary to lumbar DDD with stenosis and radiculopathy s/p L3-5 fusion/lami which require 3+ hours per day of interdisciplinary therapy in a comprehensive inpatient rehab setting. Physiatrist is providing close team supervision and 24 hour management of active medical problems listed below. Physiatrist and rehab team continue to assess barriers to  discharge/monitor patient progress toward functional and medical goals.   Medical Problem List and Plan:  1. lumbar DDD with stenosis and radiculopathy. 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 DVT Prophylaxis/Anticoagulation: SCDs.   3. Pain Management: Neurontin 300 mg 3 times a day, Dilaudid 2-4 mg every 4 hours as needed and Robaxin as needed.  Good balance at present.  4. Mood/depression. Zoloft 50 mg daily, Wellbutrin 200 mg daily.  5. Neuropsych: This patient is capable of making decisions on her own behalf.  6. Hypertension. Hydrochlorothiazide 25 mg daily.   7. Hypothyroidism. Synthroid  8. Hyperlipidemia. Lipitor  9. Constipation. Adjust bowel program as needed  10.  e coli UTI - on Cipro and low dose ditropan at night to help with some of her frequency   LOS (Days) 3 A FACE TO FACE EVALUATION WAS PERFORMED  Rene Paci 04/22/2013 12:04 PM

## 2013-04-22 NOTE — Plan of Care (Signed)
Problem: SCI BLADDER ELIMINATION Goal: RH STG MANAGE BLADDER WITH ASSISTANCE STG Manage Bladder With min Assistance  Outcome: Progressing Patient began Ditropan this evening  Problem: RH SKIN INTEGRITY Goal: RH STG SKIN FREE OF INFECTION/BREAKDOWN No new skin breakdown or infection while on rehab. Healing of the back incision without any complications.  Outcome: Progressing Incision OTA, clean dry and intact  Problem: RH PAIN MANAGEMENT Goal: RH STG PAIN MANAGED AT OR BELOW PT'S PAIN GOAL 3 or less on scale 0-10  Outcome: Progressing Pain level 1

## 2013-04-23 ENCOUNTER — Inpatient Hospital Stay (HOSPITAL_COMMUNITY): Payer: BC Managed Care – PPO

## 2013-04-23 ENCOUNTER — Inpatient Hospital Stay (HOSPITAL_COMMUNITY): Payer: BC Managed Care – PPO | Admitting: Physical Therapy

## 2013-04-23 NOTE — Progress Notes (Signed)
Occupational Therapy Session Note  Patient Details  Name: [name] MRN: [id number] Date of Birth: 1945/12/27  Today's Date: 04/23/2013 Time: 0800-0900 Time Calculation (min): 60 min  Short Term Goals: Week 1:  OT Short Term Goal 1 (Week 1): Patient will complete UB/LB bathing at shower level with minimal assistance OT Short Term Goal 2 (Week 1): Patient will complete shower stall transfer with minimal assistance OT Short Term Goal 3 (Week 1): Patient will complete LB dressing with moderate assistance OT Short Term Goal 4 (Week 1): Patient will complete toilet transfer with minimal assistance  Skilled Therapeutic Interventions/Progress Updates: Therapeutic activities with emphasis on functional mobility using RW, endurance, transfer skills (toilet and car), and educational review/trial of alternate AD (4-wheeled walker w/seat).   Patient able to complete toileting with standby assist to transfer from RW to toilet and completed clothing management and hygiene, with distant supervision.  Patient donned shoes using reacher and shoe horn with extra time and min assist to problem-solve for donning AFO.   Patient completed functional mobility to rehab gym, approx 300', with 1 rest before standing for upper arm ergometry X 6 minutes, level 1.3.   Patient completed car transfer using simulator with husband present and verbal cues for positioning for safety.   Patient ambulated 20' using 4-wheeled walker w/seat with good safety awareness and control and reported interest in continued use of device, if feasible.    Therapy Documentation Precautions:  Precautions Precautions: Back;Fall Precaution Booklet Issued: No Precaution Comments: Patient able to verbalize 3/3 back precautions with min verbal cues Required Braces or Orthoses: Spinal Brace Spinal Brace: Lumbar corset;Applied in sitting position Other Brace/Splint: AFO Lt foot Restrictions Weight Bearing Restrictions: No General:   Vital  Signs: Therapy Vitals Pulse Rate: 76 BP: 114/79 mmHg Pain: Pain Assessment Pain Assessment: 0-10 Pain Score: 2  Pain Type: Neuropathic pain Pain Location: Leg Pain Orientation: Right Pain Descriptors / Indicators: Burning Pain Frequency: Intermittent Pain Onset: With Activity Patients Stated Pain Goal: 0 Pain Intervention(s): Medication (See eMAR);Distraction  See FIM for current functional status  Therapy/Group: Individual Therapy  Georgeanne Nim 04/23/2013, 9:33 AM

## 2013-04-23 NOTE — Progress Notes (Signed)
Physical Therapy Note  Patient Details  Name: YOANA STAIB MRN: [id number] Date of Birth: October 28, 1945 Today's Date: 04/23/2013  1345-1425 (40 minutes) individual Pain: no reported pain Focus of treatment: therapeutic exercises focused on Left LE strengthening/ activity tolerance Treatment: wc mobility- SBA on unit 120 feet X 2; Nustep level 5 X 10 minutes LEs only; standing with assist of rail LT LE  Hip flexion to 8 inch step X 10 ; hip abduction X 10, up /down 4 inch step focusing on concentric/eccentric control (pt has decreased strength at end of concentric and eccentric range).    Mckoy Bhakta,JIM 04/23/2013, 2:23 PM

## 2013-04-23 NOTE — Progress Notes (Signed)
Occupational Therapy Note  Patient Details  Name: [name] MRN: [id number] Date of Birth: [birth year] Today's Date: 04/23/2013  Time: 1300-1330 Pt denies pain Individual Therapy  Pt sitting in w/c upon arrival.  Focus on doffing/donning Lt shoe and adhering to back precautions.  Pt initially attempted using long metal shoe horn to place at back of shoe to assist with slipping Lt foot into shoe.  This was unsuccessful with multiple attempts.  Attempted same task unsuccessfully with foot stool.  Pt is able to cross LLE onto right knee and don shoe successfully.  Pt requires assistance attaching distal velcro strap on AFO.  Previous therapist recommended sewing a longer strap onto current strap to provided additional material to grab with reacher.  Pt stated she would try this once at home.   Lavone Neri [facility] 04/23/2013, 2:58 PM

## 2013-04-23 NOTE — Progress Notes (Signed)
Physical Therapy Session Note  Patient Details  Name: [name] MRN: [id number] Date of Birth: 18-Jan-1946  Today's Date: 04/23/2013 Time: 0930-1030 Time Calculation (min): 60 min  Short Term Goals: Week 1:  PT Short Term Goal 1 (Week 1): Pt will be able to transfer with S with LRAD PT Short Term Goal 2 (Week 1): Pt will be able to gait with LRAD x 150' with S PT Short Term Goal 3 (Week 1): Pt will be able to demonstrate dynamic balance during functional tasks with S PT Short Term Goal 4 (Week 1): Pt will be able to go up/down curb step with min A  Skilled Therapeutic Interventions/Progress Updates:    Session focused on gait outdoor and off unit for community mobility with RW with emphasis on safety and endurance, stair negotiation x 10 steps with steady A with Bilateral rails, and Nustep on level 5 with LE's only x 10 min for functional strengthening and endurance. Pt's husband present during session and educated on community mobility as well. Pt making excellent progress. Plan to discuss with primary team tomorrow for d/c plan earlier this week.  Therapy Documentation Precautions:  Precautions Precautions: Back;Fall Precaution Booklet Issued: No Precaution Comments: Patient able to verbalize 3/3 back precautions with min verbal cues Required Braces or Orthoses: Spinal Brace Spinal Brace: Lumbar corset;Applied in sitting position Other Brace/Splint: AFO Lt foot Restrictions Weight Bearing Restrictions: No  Pain: Denies pain.  See FIM for current functional status  Therapy/Group: Individual Therapy  Karolee Stamps [facility] 04/23/2013, 12:06 PM

## 2013-04-23 NOTE — Progress Notes (Signed)
Patient ID: [name], female   DOB: [birth year], 67 y.o.   MRN: [id number]  Subjective/Complaints: Pain overall well controlled. Did well with therapy, but reports her left leg has anterior shin numbness   Objective: Vital Signs: Blood pressure 114/79, pulse 76, temperature 97.6 F (36.4 C), temperature source Oral, resp. rate 18, SpO2 98.00%. No results found. No results found for this basename: WBC, HGB, HCT, PLT,  in the last 72 hours No results found for this basename: NA, K, CL, CO, GLUCOSE, BUN, CREATININE, CALCIUM,  in the last 72 hours CBG (last 3)  No results found for this basename: GLUCAP,  in the last 72 hours  Wt Readings from Last 3 Encounters:  04/13/13 87.5 kg (192 lb 14.4 oz)  04/13/13 87.5 kg (192 lb 14.4 oz)  03/31/13 87.454 kg (192 lb 12.8 oz)    Physical Exam:  General: Alert and oriented x 3, No apparent distress . She made good eye contact with examiner.  Neck: Supple without JVD or lymphadenopathy  Heart: Reg rate and rhythm. No murmurs. No edema BLE Chest: CTA bilaterally without wheezes, rales, or rhonchi; no distress  Skin: Clean and intact without signs of breakdown and Steri-Strips in place. No drainage  Neuro: Mild LLE weakness with foot drop brace in place, chronic heel cord contracture. Otherwise strength was 5 RUE, 4+ LUE, 3-4/5 RLE and 3/5 LLE prox greater than distal. No consistent sensory findings.  No apparent CN abnormalities.  Musculoskeletal: back expectedly tender.  Psych: Pt's affect is appropriate. Pt is cooperative with exam   Assessment/Plan: Functional deficits secondary to lumbar DDD with stenosis and radiculopathy s/p L3-5 fusion/lami which require 3+ hours per day of interdisciplinary therapy in a comprehensive inpatient rehab setting. Physiatrist is providing close team supervision and 24 hour management of active medical problems listed below. Physiatrist and rehab team continue to assess barriers to discharge/monitor patient  progress toward functional and medical goals.   Medical Problem List and Plan:  1. lumbar DDD with stenosis and radiculopathy. 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 DVT Prophylaxis/Anticoagulation: SCDs.   3. Pain Management: Neurontin 300 mg 3 times a day, Dilaudid 2-4 mg every 4 hours as needed and Robaxin as needed.  Good balance at present.  4. Mood/depression. Zoloft 50 mg daily, Wellbutrin 200 mg daily.  5. Neuropsych: This patient is capable of making decisions on her own behalf.  6. Hypertension. Hydrochlorothiazide 25 mg daily.   7. Hypothyroidism. Synthroid  8. Hyperlipidemia. Lipitor  9. Constipation. Adjust bowel program as needed  10.  e coli UTI - on Cipro and low dose ditropan at night to help with some of her frequency   LOS (Days) 4 A FACE TO FACE EVALUATION WAS PERFORMED  Rene Paci 04/23/2013 10:29 AM

## 2013-04-23 NOTE — Progress Notes (Signed)
[facility] Individual Statement of Services  Patient Name:  [name]  Date:  04/23/2013  Welcome to the [facility]patient [facility].  Our goal is to provide you with an individualized program based on your diagnosis and situation, designed to meet your specific needs.  With this comprehensive rehabilitation program, you will be expected to participate in at least 3 hours of rehabilitation therapies Monday-Friday, with modified therapy programming on the weekends.  Your rehabilitation program will include the following services:  Physical Therapy (PT), Occupational Therapy (OT), 24 hour per day rehabilitation nursing, Therapeutic Recreaction (TR), Neuropsychology, Case Management (Social Worker), Rehabilitation Medicine, Nutrition Services and Pharmacy Services  Weekly team conferences will be held on Tuesdays to discuss your progress.  Your Social Worker will talk with you frequently to get your input and to update you on team discussions.  Team conferences with you and your family in attendance may also be held.  Expected length of stay: 2 weeks  Overall anticipated outcome: supervision to modified independent  Depending on your progress and recovery, your program may change. Your Social Worker will coordinate services and will keep you informed of any changes. Your Social Worker's name and contact numbers are listed  below.  The following services may also be recommended but are not provided by the Inpatient [facility] Center:   Driving Evaluations  Home Health Rehabiltiation Services  Outpatient Rehabilitation Services  Vocational Rehabilitation   Arrangements will be made to provide these services after discharge if needed.  Arrangements include referral to agencies that provide these services.  Your insurance has been verified to be:  [employer] BCBS, Medicare Part A Your primary doctor is:  Dr. Riley Nearing  Pertinent information will be shared with your  doctor and your insurance company.  Social Worker:  [place], [place] [phone] or [phone]   Information discussed with and copy given to patient by: Amada Jupiter, 04/23/2013, 2:44 PM

## 2013-04-23 NOTE — Progress Notes (Addendum)
Social Work Assessment and Plan Social Work Assessment and Plan  Patient Details  Name: [name] MRN: [id number] Date of Birth: 02-Mar-1946  Today's Date: 04/23/2013  Problem List:  Patient Active Problem List   Diagnosis Date Noted  . Lumbar radiculopathy 04/19/2013  . DIARRHEA 09/15/2010  . PERSONAL HISTORY OF FAILED MODERATE SEDATION 09/15/2010  . DIARRHEA-PRESUMED INFECTIOUS 07/24/2010  . HEMORRHOIDS-INTERNAL 12/10/2008  . HEMORRHOIDS-EXTERNAL 12/10/2008  . PERSONAL HX COLONIC POLYPS 12/10/2008  . COLONIC POLYPS, HYPERPLASTIC 11/22/2008  . HYPERLIPIDEMIA 11/22/2008  . ANXIETY 11/22/2008  . DEPRESSION 11/22/2008  . ABNORMAL HEART RHYTHMS 11/22/2008  . HEMORRHOIDS 11/22/2008  . GERD 11/22/2008  . HIATAL HERNIA 11/22/2008  . HYPERTENSION NEC 11/22/2008   Past Medical History:  Past Medical History  Diagnosis Date  . Benign neoplasm of colon   . Depressive disorder, not elsewhere classified   . Anxiety state, unspecified   . Other and unspecified hyperlipidemia   . Esophageal reflux   . Obesity   . Hypothyroidism   . Hemorrhoids   . Colon polyps     hyperplastic  . Family history of colon cancer mother  . Lymphocytic colitis   . Contact lens/glasses fitting     wears glassses or contacts  . Complications affecting other specified body systems, hypertension     No cardiologist   . Seizures     x 1  . Brain tumor    Past Surgical History:  Past Surgical History  Procedure Laterality Date  . Craniotomy    . Back surgery    . Abdominal hysterectomy    . Appendectomy    . Carpal tunnel release      right  . Brain surgery  1999    benign tumor  . Hammertoe reconstruction with weil osteotomy  07/07/2012    Procedure: HAMMERTOE RECONSTRUCTION WITH WEIL OSTEOTOMY;  Surgeon: Toni Arthurs, MD;  Location: [facility];  Service: Orthopedics;  Laterality: Left;  Left Second through Fourth Hammertoe Correction; Left Fifth Toe Flexor Tendon Release, and  Left Second through Fourth Weil Osteotomy   Social History:  reports that she quit smoking about 2 years ago. She has never used smokeless tobacco. She reports that she drinks about 3.5 ounces of alcohol per week. She reports that she does not use illicit drugs.  Family / Support Systems Marital Status: Married How Long?: 14 yrs  Patient Roles: Spouse;Parent Spouse/Significant Other: spouse, [name] @ [phone] or [phone] Children: pt has 2 adult children (plus one child deceased) - both living locally and close by to pt.  Dtr,[name] and son, [name] Anticipated Caregiver: husband aware pt will likely require 24/7 need to coordinate. Ability/Limitations of Caregiver: spouse works in home and travels at times; Medical illustrator: 24/7 Family Dynamics: pt describes all family as very supportive  Social History Preferred language: English Religion: [employer] Cultural Background: NA Education: HS Read: Yes Write: Yes Employment Status: Retired Date Retired/Disabled/Unemployed: 2005 Fish farm manager Issues: none Guardian/Conservator: none   Abuse/Neglect Physical Abuse: Denies Verbal Abuse: Denies Sexual Abuse: Denies Exploitation of patient/patient's resources: Denies Self-Neglect: Denies  Emotional Status Pt's affect, behavior adn adjustment status: Pt very pleasant and talkative.  Tangential at times and needs subtle redirection.  Speaks mostly about her family and her hopes to regain independence.  Attempted to address her emotional adjustment and questioning specifically about whether she is having any s/s of emotional distress.  (Noted depression and suicidal ideations seen on acute:Marland Kitchen  Pt quickly  states, "I am fine. I feel good.  I have so much support."  She denies any depressing symptoms.  Will need to monitor and potentially request neuropsych involvement. Recent Psychosocial Issues: none Pyschiatric History: Pt reports that "I just  start crying so much easier now when I get frustrated."  Chart indicates h/o depression and anxiety, however, pt notes no s/s currently. Substance Abuse History: None  Patient / Family Perceptions, Expectations & Goals Pt/Family understanding of illness & functional limitations: Pt and family with general understanding of surgery performed and current limitations/ need for CIR. Premorbid pt/family roles/activities: pt was independent at home usually with a cane for mobility. Anticipated changes in roles/activities/participation: Pt will likely require 24/7 at d/c with family needing to assume some caregiver roles. Pt/family expectations/goals: pt hoping for mod i goals if possible  [employer]: None Premorbid Home Care/DME Agencies: None Transportation available at discharge: yes Resource referrals recommended: Neuropsychology  Discharge Planning Living Arrangements: Spouse/significant other Support Systems: Spouse/significant other;Children Type of Residence: Private residence [employer] Resources: Media planner (specify);Medicare Chief Technology Officer BCBS (primary);  Part A only with [facility]) Financial Resources: Restaurant manager, fast food Screen Referred: No Living Expenses: Database administrator Management: Spouse Does the patient have any problems obtaining your medications?: No Home Management: pt and spouse share Patient/Family Preliminary Plans: Pt plans to return home with her spouse and family to support Barriers to Discharge: Family Support (Husband does travel with his work) Social Work Anticipated Follow Up Needs: HH/OP Expected length of stay: ELOS 2 weeks  Clinical Impression Pleasant, talkative woman here after back surgery.  Tangential at times but fully oriented.  Denies any significant emotional distress and describes very supportive family. She denies any concerns about d/c, however, it appears husband may have to leave home for work at times. Will monitor and  assist with d/c planning needs.  [name] 04/23/2013, 2:41 PM

## 2013-04-24 ENCOUNTER — Inpatient Hospital Stay (HOSPITAL_COMMUNITY): Payer: BC Managed Care – PPO | Admitting: Occupational Therapy

## 2013-04-24 ENCOUNTER — Inpatient Hospital Stay (HOSPITAL_COMMUNITY): Payer: BC Managed Care – PPO | Admitting: Physical Therapy

## 2013-04-24 ENCOUNTER — Inpatient Hospital Stay (HOSPITAL_COMMUNITY): Payer: BC Managed Care – PPO

## 2013-04-24 MED ORDER — BUPROPION HCL ER (SR) 150 MG PO TB12
150.0000 mg | ORAL_TABLET | Freq: Every day | ORAL | Status: DC
  Administered 2013-04-25 – 2013-04-26 (×2): 150 mg via ORAL
  Filled 2013-04-24 (×3): qty 1

## 2013-04-24 NOTE — Progress Notes (Signed)
Occupational Therapy Session Notes  Patient Details  Name: [name] MRN: [id number] Date of Birth: 05/27/1946  Today's Date: 04/24/2013  Short Term Goals: Week 1:  OT Short Term Goal 1 (Week 1): Patient will complete UB/LB bathing at shower level with minimal assistance OT Short Term Goal 2 (Week 1): Patient will complete shower stall transfer with minimal assistance OT Short Term Goal 3 (Week 1): Patient will complete LB dressing with moderate assistance OT Short Term Goal 4 (Week 1): Patient will complete toilet transfer with minimal assistance  Skilled Therapeutic Interventions/Progress Updates:   Session #1 [phone] - 55 Minutes Individual Therapy No complaints of pain Patient found seated edge of bed with back brace donned upon entering room, RN giving medications. Patient stood with RW and ambulated around room to gather necessary items. Patient then ambulated into bathroom for toilet transfer onto elevated toilet seat and shower stall transfer onto tub transfer bench. UB/LB bathing at shower level completed from here in seated position; lateral leans for peri care. Patient stayed seated on bench to donn bra, shirt, and back brace. Patient ambulated out of shower and performed LB dressing in sit<>stand position from w/c level using AE appropriately and prn. Patient is motivated to discharge the beginning of this week. Patient is currently functioning at an overall supervision -> mod I level. At end of session, left patient seated in w/c in room with breakfast tray, call bell, and phone within reach.   Session #2 [id number] - 45 Minutes Individual Therapy No complaints of pain Patient found seated in w/c in room. Therapist made patient mod I within room. Patient then ambulated from room -> ADL apartment for light housekeeping task of making bed and furniture transfer on/off couch. Patient then ambulated to therapy gym for therapeutic activity focusing on dynamic standing  balance/tolerance/endurance, adhering to back precautions, functional ambulation, and overall activity tolerance/endurance. After activity, patient engaged in BUE strengthening exercises using ergometer in standing position for 10 minutes then ambulated back to room with RW. Left patient seated in w/c with call bell, phone, and RW within reach.   Precautions:  Precautions Precautions: Back;Fall Precaution Booklet Issued: No Precaution Comments: Patient able to verbalize 3/3 back precautions with min verbal cues Required Braces or Orthoses: Spinal Brace Spinal Brace: Lumbar corset;Applied in sitting position Other Brace/Splint: AFO Lt foot Restrictions Weight Bearing Restrictions: No  See FIM for current functional status  Charonda Hefter 04/24/2013, 7:25 AM

## 2013-04-24 NOTE — Progress Notes (Signed)
Physical Therapy Session Note  Patient Details  Name: [name] MRN: [id number] Date of Birth: 1946-02-16  Today's Date: 04/24/2013 Time: 835-930 Time Calculation (min): 55 min  Short Term Goals: Week 1:  PT Short Term Goal 1 (Week 1): Pt will be able to transfer with S with LRAD PT Short Term Goal 2 (Week 1): Pt will be able to gait with LRAD x 150' with S PT Short Term Goal 3 (Week 1): Pt will be able to demonstrate dynamic balance during functional tasks with S PT Short Term Goal 4 (Week 1): Pt will be able to go up/down curb step with min A  Skilled Therapeutic Interventions/Progress Updates:    Pt started session late in order to finish up with breakfast. Gait down to therapy gym with RW with S; improved cadence noted and cues for posture intermittently. Completed toileting in ADL apartment mod I with RW. Introduced curb step with RW for home entry and completed with steady A x 2 repetitions; safe technique noted. Cues still needed for which foot to lead with going up/down. Focused on gait training without AD (min A needed with 3 episodes of LOB) to challenge balance and reinforce need for AD at this time upon d/c. Administered Berg Balance test (see results below) and discussed with pt need for RW at this time (epsecially with examples pt giving of multiple falls at home due to LOB) and pt in agreement and understanding. She reports she already has 2 available RW's at home that she can use for one inside the house and the other outside the house.   Therapy Documentation Precautions:  Precautions Precautions: Back;Fall Precaution Booklet Issued: No Precaution Comments: Patient able to verbalize 3/3 back precautions with min verbal cues Required Braces or Orthoses: Spinal Brace Spinal Brace: Lumbar corset;Applied in sitting position Other Brace/Splint: AFO Lt foot Restrictions Weight Bearing Restrictions: No   Pain: Pain Assessment Pain Assessment: No/denies pain Pain Score:  0-No pain  Balance: Standardized Balance Assessment Standardized Balance Assessment: Berg Balance Test Berg Balance Test Sit to Stand: Able to stand  independently using hands Standing Unsupported: Able to stand 2 minutes with supervision Sitting with Back Unsupported but Feet Supported on Floor or Stool: Able to sit safely and securely 2 minutes Stand to Sit: Controls descent by using hands Transfers: Able to transfer with verbal cueing and /or supervision Standing Unsupported with Eyes Closed: Able to stand 10 seconds with supervision Standing Ubsupported with Feet Together: Able to place feet together independently and stand for 1 minute with supervision From Standing, Reach Forward with Outstretched Arm: Can reach forward >5 cm safely (2") (limited due to back precautions) From Standing Position, Pick up Object from Floor: Unable to try/needs assist to keep balance (limited due to back precautions ) From Standing Position, Turn to Look Behind Over each Shoulder: Turn sideways only but maintains balance (limited due to back precautions) Turn 360 Degrees: Needs close supervision or verbal cueing Standing Unsupported, Alternately Place Feet on Step/Stool: Needs assistance to keep from falling or unable to try Standing Unsupported, One Foot in Front: Needs help to step but can hold 15 seconds Standing on One Leg: Unable to try or needs assist to prevent fall Total Score: 27  See FIM for current functional status  Therapy/Group: Individual Therapy  Karolee Stamps [facility] 04/24/2013, 11:47 AM

## 2013-04-24 NOTE — Progress Notes (Signed)
Physical Therapy Note  Patient Details  Name: [name] MRN: [id number] Date of Birth: Jun 05, 1946 Today's Date: 04/24/2013  1000-1025 (25 minutes) individual Pain: no reported pain Focus of treatment: Therapeutic exercise focused on bilateral LE strengthening/ activity tolerance Treatment: wc mobility- mod independent on unit > 150 feet level; Nustep Level 5 (increased from level 4 on 9/28) X 10 minutes LEs only (percieived exertion + 13 somewhat hard); up/down 4 inch step alternating LEs for quad control with RW support X 10.    Selah Klang,JIM 04/24/2013, 10:08 AM

## 2013-04-24 NOTE — Progress Notes (Signed)
Subjective/Complaints: Urinary sx better overall. Pain under control. Left leg still feels heavy.   A 12 point review of systems has been performed and if not noted above is otherwise negative.   Objective: Vital Signs: Blood pressure 113/67, pulse 72, temperature 98.1 F (36.7 C), temperature source Oral, resp. rate 18, SpO2 97.00%. No results found. No results found for this basename: WBC, HGB, HCT, PLT,  in the last 72 hours No results found for this basename: NA, K, CL, CO, GLUCOSE, BUN, CREATININE, CALCIUM,  in the last 72 hours CBG (last 3)  No results found for this basename: GLUCAP,  in the last 72 hours  Wt Readings from Last 3 Encounters:  04/13/13 87.5 kg (192 lb 14.4 oz)  04/13/13 87.5 kg (192 lb 14.4 oz)  03/31/13 87.454 kg (192 lb 12.8 oz)    Physical Exam:  General: Alert and oriented x 3, No apparent distress . She made good eye contact with examiner. She did have some difficulty recalling the past few days but long-term memory was intact  HEENT: Head is normocephalic, atraumatic, PERRLA, EOMI, sclera anicteric, oral mucosa pink and moist, dentition intact, ext ear canals clear,  Neck: Supple without JVD or lymphadenopathy  Heart: Reg rate and rhythm. No murmurs rubs or gallops  Chest: CTA bilaterally without wheezes, rales, or rhonchi; no distress  Abdomen: Soft, non-tender, non-distended, bowel sounds positive.  Extremities: No clubbing, cyanosis, or edema. Pulses are 2+  Skin: Clean and intact without signs of breakdown and Steri-Strips in place. No drainage  Neuro: Mild LLE weakness, heel cord contracture, mild left deltoid weakness, some pain inhibition. Otherwise strength was 5 RUE, 4+ LUE, 3-4/5 RLE and 3/5 LLE prox greater than distal. No consistent sensory findings. Improve mentation today although still a little disconnected at times. No focal CN abnormalities.  Musculoskeletal: back expectedly tender.  Psych: Pt's affect is appropriate. Pt is cooperative  with exam   Assessment/Plan: 1. Functional deficits secondary to lumbar DDD with stenosis and radiculopathy s/p L3-5 fusion/lami which require 3+ hours per day of interdisciplinary therapy in a comprehensive inpatient rehab setting. Physiatrist is providing close team supervision and 24 hour management of active medical problems listed below. Physiatrist and rehab team continue to assess barriers to discharge/monitor patient progress toward functional and medical goals.  Encouraged pt that left knee buckling and some of sensory changes are likely related to L3/L4 nerve root involvement. May benefit form a left knee sleev to help support knee in standing.   FIM: FIM - Bathing Bathing Steps Patient Completed: Chest;Right Arm;Left Arm;Abdomen;Front perineal area;Buttocks;Right upper leg;Left upper leg;Right lower leg (including foot);Left lower leg (including foot) Bathing: 6: Assistive device (Comment)  FIM - Upper Body Dressing/Undressing Upper body dressing/undressing steps patient completed: Thread/unthread right sleeve of pullover shirt/dresss;Thread/unthread left sleeve of pullover shirt/dress;Put head through opening of pull over shirt/dress;Pull shirt over trunk Upper body dressing/undressing: 7: Complete Independence: No helper FIM - Lower Body Dressing/Undressing Lower body dressing/undressing steps patient completed: Thread/unthread right underwear leg;Pull underwear up/down;Thread/unthread right pants leg;Thread/unthread left underwear leg;Thread/unthread left pants leg;Pull pants up/down;Don/Doff right sock;Don/Doff left sock;Don/Doff right shoe;Don/Doff left shoe Lower body dressing/undressing: 6: Assistive device (Comment)  FIM - Toileting Toileting steps completed by patient: Adjust clothing prior to toileting;Performs perineal hygiene;Adjust clothing after toileting Toileting Assistive Devices: Grab bar or rail for support Toileting: 6: Assistive device: No helper  FIM -  Diplomatic Services operational officer Devices: Grab bars;Walker;Elevated toilet seat Toilet Transfers: 6-Assistive device: No helper  FIM -  Bed/Chair Transport planner Devices: Therapist, occupational: 5: Bed > Chair or W/C: Supervision (verbal cues/safety issues);5: Chair or W/C > Bed: Supervision (verbal cues/safety issues)  FIM - Locomotion: Wheelchair Locomotion: Wheelchair: 5: Travels 150 ft or more: maneuvers on rugs and over door sills with supervision, cueing or coaxing FIM - Locomotion: Ambulation Locomotion: Ambulation Assistive Devices: Walker - Rolling;Orthosis Ambulation/Gait Assistance: 5: Supervision Locomotion: Ambulation: 5: Travels 150 ft or more with supervision/safety issues  Comprehension Comprehension Mode: Auditory Comprehension: 7-Follows complex conversation/direction: With no assist  Expression Expression Mode: Verbal Expression: 7-Expresses complex ideas: With no assist  Social Interaction Social Interaction: 7-Interacts appropriately with others - No medications needed.  Problem Solving Problem Solving: 6-Solves complex problems: With extra time  Memory Memory: 6-More than reasonable amt of time  Medical Problem List and Plan:  1. lumbar DDD with stenosis and radiculopathy. Status post L3-5 fusion, laminectomy, prior brain tumor resection   -decadron taper 2. DVT Prophylaxis/Anticoagulation: SCDs.   3. Pain Management: Neurontin 300 mg 3 times a day, Dilaudid 2-4 mg every 4 hours as needed and Robaxin as needed.  Good balance at present.  4. Mood/depression. Zoloft 50 mg daily, Wellbutrin 200 mg daily---decrease to 150mg  per pt request 5. Neuropsych: This patient is capable of making decisions on her own behalf.  6. Hypertension. Hydrochlorothiazide 25 mg daily.  Good control in general 7. Hypothyroidism. Synthroid  8. Hyperlipidemia. Lipitor  9. Constipation. Adjust bowel program as needed  10. Uro:    -100k e coli,  sensitive to cipro  -continue qhs ditropan  -encouraged pt that this should continue to improve after dc  LOS (Days) 5 A FACE TO FACE EVALUATION WAS PERFORMED  SWARTZ,ZACHARY T 04/24/2013 8:31 AM

## 2013-04-25 ENCOUNTER — Inpatient Hospital Stay (HOSPITAL_COMMUNITY): Payer: BC Managed Care – PPO

## 2013-04-25 ENCOUNTER — Inpatient Hospital Stay (HOSPITAL_COMMUNITY): Payer: BC Managed Care – PPO | Admitting: Physical Therapy

## 2013-04-25 ENCOUNTER — Inpatient Hospital Stay (HOSPITAL_COMMUNITY): Payer: BC Managed Care – PPO | Admitting: Occupational Therapy

## 2013-04-25 DIAGNOSIS — N390 Urinary tract infection, site not specified: Secondary | ICD-10-CM

## 2013-04-25 DIAGNOSIS — IMO0002 Reserved for concepts with insufficient information to code with codable children: Secondary | ICD-10-CM

## 2013-04-25 DIAGNOSIS — M47817 Spondylosis without myelopathy or radiculopathy, lumbosacral region: Secondary | ICD-10-CM

## 2013-04-25 MED ORDER — OXYBUTYNIN CHLORIDE 5 MG PO TABS
5.0000 mg | ORAL_TABLET | Freq: Every day | ORAL | Status: DC
  Administered 2013-04-25 – 2013-04-26 (×2): 5 mg via ORAL
  Filled 2013-04-25 (×4): qty 1

## 2013-04-25 MED ORDER — OXYBUTYNIN CHLORIDE 5 MG PO TABS
10.0000 mg | ORAL_TABLET | Freq: Every day | ORAL | Status: DC
  Administered 2013-04-25: 10 mg via ORAL
  Filled 2013-04-25 (×3): qty 2

## 2013-04-25 MED ORDER — DEXAMETHASONE 2 MG PO TABS
2.0000 mg | ORAL_TABLET | Freq: Three times a day (TID) | ORAL | Status: DC
  Administered 2013-04-25 – 2013-04-26 (×3): 2 mg via ORAL
  Filled 2013-04-25 (×6): qty 1

## 2013-04-25 NOTE — Discharge Summary (Signed)
NAMELANINA, LARRANAGA NO.:  [id number]  MEDICAL RECORD NO.:  [id number]  LOCATION:  4W23C                        FACILITY:  [facility]  PHYSICIAN:  Ranelle Oyster, M.D.DATE OF BIRTH:  04/19/1946  DATE OF ADMISSION:  04/19/2013 DATE OF DISCHARGE:                              DISCHARGE SUMMARY   DISCHARGE DIAGNOSES: 1. Lumbar degenerative disk disease with stenosis and radiculopathy,     status post fusion laminectomy. 2. Sequential compression devices for deep vein thrombosis     prophylaxis. 3. Pain management. 4. Depression. 5. Hypertension. 6. Hypothyroidism. 7. Hyperlipidemia. 8. Escherichia coli urinary tract infection.  HISTORY OF PRESENT ILLNESS:  This 67 year old female with history of benign brain tumor, status post resection in 1999, with resultant left foot drop and did receive the inpatient rehab services.  Also history of back surgery several years ago.  Admitted April 12, 2013, with progressive low back pain radiating to the lower extremities.  X-rays and imaging revealed degenerative disk disease, as well as lumbar L3-4 disk protrusion stenosis with radiculopathy.  Underwent bilateral L3, 4, 5 laminectomy interbody fusion with pedicle screws April 13, 2013, per Dr. Jeral Fruit.  Postoperative pain management.  She was weaned from Decadron.  Back brace when out of bed.  There was some postoperative confusion with suicidal ideations.  Followup Psychiatry Services.  Her Wellbutrin was adjusted and she continued on Zoloft.  Her mental status improved.  Suicide watch was discontinued.  Physical and occupational therapy ongoing.  The patient was admitted for a comprehensive rehab program.  PAST MEDICAL HISTORY:  See discharge diagnoses.  SOCIAL HISTORY:  She lives with her husband.  FUNCTIONAL HISTORY PRIOR TO ADMISSION:  Independent with left AFO brace.  FUNCTIONAL STATUS:  Upon admission to rehab services with +2 total assist, ambulate 5  feet with the rolling walker.  PHYSICAL EXAMINATION:  VITAL SIGNS:  Blood pressure 117/72, pulse 80, temperature 97, respirations 18. GENERAL:  This was an alert female, in no acute distress, oriented x3. Pupils round and reactive to light. LUNGS:  Clear to auscultation without wheeze or rhonchi. CARDIAC:  Regular rate and rhythm. ABDOMEN:  Soft, nontender.  Good bowel sound. BACK:  Her back incision was clean and dry with Steri-Strips in place.  REHABILITATION HOSPITAL COURSE:  The patient was admitted to Inpatient Rehab Services with therapies initiated on a 3-hour daily basis consisting of physical therapy, occupational therapy, and 24-hour rehabilitation nursing.  The following issues were addressed during the patient's rehabilitation stay.  Pertaining to Ms. Fadness's lumbar stenosis with radiculopathy, she had undergone L3-5 fusion laminectomy per Neurosurgery, Dr. Jeral Fruit.  Back brace when out of bed.  She continued with sequential compression devices for DVT prophylaxis.  Venous Doppler studies negative for DVT.  Pain management with the use of Neurontin 300 mg 3 times daily.  She was weaned from her Decadron.  She continued on Dilaudid for breakthrough pain, as well as Robaxin with good results and monitoring of mental status.  She was completing a course of Cipro for E. coli urinary tract infection.  She denied any dysuria or hematuria. She remained afebrile.  Blood pressures controlled with hydrochlorothiazide.  She would follow up  with her primary MD.  She did have a documented history of depression, anxiety.  Her Wellbutrin had been increased to 200 mg daily.  She continued on Zoloft with emotional support provided.  The patient received weekly collaborative interdisciplinary team conferences to discuss estimated length of stay, family teaching, and any barriers to discharge.  She was modified independent on the unit, ambulating greater than 150 feet AFO brace. She is still  needing some assist for lower body activities of daily living when back brace was in place.  Full family teaching was completed with her husband, and plan was to be discharged to home with ongoing therapies arranged.  DISCHARGE MEDICATIONS: 1. Lipitor 40 mg p.o. at bedtime. 2. Wellbutrin 150 mg p.o. daily. 3. Decadron 2 mg every 6 hours taper as directed. 4. Neurontin 300 mg p.o. t.i.d. 5. Hydrochlorothiazide 25 mg p.o. daily. 6. Dilaudid 2-4 mg p.o. every 4 hours as needed for pain. 7. Synthroid 88 mcg p.o. daily. 8. Claritin 10 mg p.o. daily. 9. Robaxin 500 mg p.o. every 6 hours as needed for muscle spasms. 10.Cipro 250 mg p.o. b.i.d. complete for E. coli urinary tract     infection. 11.Ditropan 5 mg p.o. at bedtime. 12.Protonix 40 mg p.o. daily. 13.Senokot 1 tablet p.o. b.i.d. 14.Zoloft 50 mg p.o. daily.  DIET:  Regular.  SPECIAL INSTRUCTIONS:  Back brace when out of bed.  Brace could be placed sitting at the edge of the bed.  Followups include Dr. Jeral Fruit, Neurosurgery 2 weeks call for appointment.  Dr. Faith Rogue at the Outpatient Rehab Service office as needed and Dr. Riley Nearing medical management appointment to be scheduled.     Mariam Dollar, P.A.   ______________________________ Ranelle Oyster, M.D.    DA/MEDQ  D:  04/25/2013  T:  04/25/2013  Job:  [id number]  cc:   Ranelle Oyster, M.D. Hilda Lias, M.D. Bernadette Hoit, MD

## 2013-04-25 NOTE — Discharge Summary (Signed)
  Discharge summary job # [id number]

## 2013-04-25 NOTE — Progress Notes (Signed)
Physical Therapy Session Note  Patient Details  Name: [name] MRN: [id number] Date of Birth: 10/07/1945  Today's Date: 04/25/2013 Time: 1030-1100 Time Calculation (min): 30 min  Short Term Goals: Week 1:  PT Short Term Goal 1 (Week 1): Pt will be able to transfer with S with LRAD PT Short Term Goal 2 (Week 1): Pt will be able to gait with LRAD x 150' with S PT Short Term Goal 3 (Week 1): Pt will be able to demonstrate dynamic balance during functional tasks with S PT Short Term Goal 4 (Week 1): Pt will be able to go up/down curb step with min A  Skilled Therapeutic Interventions/Progress Updates:    Pt performed gait training with RW on various surfaces with supervision to no assist. Gait without AD with HHA. Review of provided written handout for HEP, with pt displaying good understanding of proper performance and technique. Pt is proficient with HEP.   Therapy Documentation Precautions:  Precautions Precautions: Back;Fall Precaution Booklet Issued: No Precaution Comments: patient able to independently verbalize and adhere to 3/3 back precautions Required Braces or Orthoses: Spinal Brace Spinal Brace: Lumbar corset;Applied in sitting position Other Brace/Splint: AFO Lt foot Restrictions Weight Bearing Restrictions: No       Pain: Pain Assessment Pain Assessment: No/denies pain    Locomotion : Ambulation Ambulation/Gait Assistance: 6: Modified independent (Device/Increase time)    See FIM for current functional status  Therapy/Group: Individual Therapy  Jackelyn Knife 04/25/2013, 1:01 PM

## 2013-04-25 NOTE — Progress Notes (Signed)
Subjective/Complaints: Pleased with progress. Urinating frequently still   A 12 point review of systems has been performed and if not noted above is otherwise negative.   Objective: Vital Signs: Blood pressure 118/67, pulse 68, temperature 98.1 F (36.7 C), temperature source Oral, resp. rate 18, SpO2 99.00%. No results found. No results found for this basename: WBC, HGB, HCT, PLT,  in the last 72 hours No results found for this basename: NA, K, CL, CO, GLUCOSE, BUN, CREATININE, CALCIUM,  in the last 72 hours CBG (last 3)  No results found for this basename: GLUCAP,  in the last 72 hours  Wt Readings from Last 3 Encounters:  04/13/13 87.5 kg (192 lb 14.4 oz)  04/13/13 87.5 kg (192 lb 14.4 oz)  03/31/13 87.454 kg (192 lb 12.8 oz)    Physical Exam:  General: Alert and oriented x 3, No apparent distress . She made good eye contact with examiner. She did have some difficulty recalling the past few days but long-term memory was intact  HEENT: Head is normocephalic, atraumatic, PERRLA, EOMI, sclera anicteric, oral mucosa pink and moist, dentition intact, ext ear canals clear,  Neck: Supple without JVD or lymphadenopathy  Heart: Reg rate and rhythm. No murmurs rubs or gallops  Chest: CTA bilaterally without wheezes, rales, or rhonchi; no distress  Abdomen: Soft, non-tender, non-distended, bowel sounds positive.  Extremities: No clubbing, cyanosis, or edema. Pulses are 2+  Skin: Clean and intact without signs of breakdown and Steri-Strips in place. No drainage  Neuro: Mild LLE weakness, heel cord contracture, mild left deltoid weakness, some pain inhibition. Otherwise strength was 5 RUE, 4+ LUE, 3-4/5 RLE and 3/5 LLE prox greater than distal. No consistent sensory findings. Improve mentation today although still a little disconnected at times. No focal CN abnormalities.  Musculoskeletal: back expectedly tender.  Psych: Pt's affect is appropriate. Pt is cooperative with  exam   Assessment/Plan: 1. Functional deficits secondary to lumbar DDD with stenosis and radiculopathy s/p L3-5 fusion/lami which require 3+ hours per day of interdisciplinary therapy in a comprehensive inpatient rehab setting. Physiatrist is providing close team supervision and 24 hour management of active medical problems listed below. Physiatrist and rehab team continue to assess barriers to discharge/monitor patient progress toward functional and medical goals.  Mod I in room. Dc tomorrow  FIM: FIM - Bathing Bathing Steps Patient Completed: Chest;Right Arm;Left Arm;Abdomen;Front perineal area;Buttocks;Right upper leg;Left upper leg;Right lower leg (including foot);Left lower leg (including foot) Bathing: 6: Assistive device (Comment)  FIM - Upper Body Dressing/Undressing Upper body dressing/undressing steps patient completed: Thread/unthread right sleeve of pullover shirt/dresss;Thread/unthread left sleeve of pullover shirt/dress;Put head through opening of pull over shirt/dress;Pull shirt over trunk Upper body dressing/undressing: 7: Complete Independence: No helper FIM - Lower Body Dressing/Undressing Lower body dressing/undressing steps patient completed: Thread/unthread right underwear leg;Pull underwear up/down;Thread/unthread right pants leg;Thread/unthread left underwear leg;Thread/unthread left pants leg;Pull pants up/down;Don/Doff right sock;Don/Doff left sock;Don/Doff right shoe;Don/Doff left shoe Lower body dressing/undressing: 6: Assistive device (Comment)  FIM - Toileting Toileting steps completed by patient: Adjust clothing prior to toileting;Performs perineal hygiene Toileting Assistive Devices: Grab bar or rail for support Toileting: 6: Assistive device: No helper  FIM - Diplomatic Services operational officer Devices: Grab bars;Walker;Elevated toilet seat Toilet Transfers: 6-Assistive device: No helper  FIM - Banker  Devices: Therapist, occupational: 6: Assistive device: no helper  FIM - Locomotion: Wheelchair Locomotion: Wheelchair: 0: Activity did not occur FIM - Locomotion: Ambulation Locomotion: Ambulation Assistive Devices: Orthosis Ambulation/Gait  Assistance: 4: Min assist Locomotion: Ambulation: 4: Travels 150 ft or more with minimal assistance (Pt.>75%) (S with RW)  Comprehension Comprehension Mode: Auditory Comprehension: 7-Follows complex conversation/direction: With no assist  Expression Expression Mode: Verbal Expression: 7-Expresses complex ideas: With no assist  Social Interaction Social Interaction: 7-Interacts appropriately with others - No medications needed.  Problem Solving Problem Solving: 6-Solves complex problems: With extra time  Memory Memory: 6-More than reasonable amt of time  Medical Problem List and Plan:  1. lumbar DDD with stenosis and radiculopathy. Status post L3-5 fusion, laminectomy, prior brain tumor resection   -decadron taper 2. DVT Prophylaxis/Anticoagulation: SCDs.   3. Pain Management: Neurontin 300 mg 3 times a day, Dilaudid 2-4 mg every 4 hours as needed and Robaxin as needed.  Good balance at present.  4. Mood/depression. Zoloft 50 mg daily, Wellbutrin 200 mg daily---decrease to 150mg  per pt request 5. Neuropsych: This patient is capable of making decisions on her own behalf.  6. Hypertension. Hydrochlorothiazide 25 mg daily.  Good control in general 7. Hypothyroidism. Synthroid  8. Hyperlipidemia. Lipitor  9. Constipation. Adjust bowel program as needed  10. Uro:    -100k e coli, sensitive to cipro  -increase ditropan to 32m qam and 10mg  qhs  -encouraged pt that this should continue to improve after dc  LOS (Days) 6 A FACE TO FACE EVALUATION WAS PERFORMED  Sofie Schendel T 04/25/2013 8:33 AM

## 2013-04-25 NOTE — Progress Notes (Signed)
Occupational Therapy Session Note & Discharge Summary  Patient Details  Name: [name] MRN: [id number] Date of Birth: 1946-06-22  Today's Date: 04/25/2013  SESSION NOTE Time: 0900-0955 Time Calculation (min): 55 min Individual Therapy No complaints of pain Patient found seated in w/c upon entering room. Patient engaged in ADL at shower level at a modified independent level; patient is able to gather necessary items and perform tasks safely and efficiently using rolling walker and other AE prn. Patient is able to independently donn and doff lumbar corset. Patient uses reacher for many tasks throughout room; to pick items up off floor, to reach items in awkward places, etc. Patient is motivated to take a bath in her newly renovated tub, therapist recommended patient wait until she no longer had back precautions and no longer needed the lumbar corset for back support; patient stated she understood. Patient is also independent with UE HEP.  --------------------------------------------------------------------------------------------------------------------  DISCHARGE SUMMARY Patient has met 10 of 10 long term goals due to improved activity tolerance, improved balance, postural control, ability to compensate for deficits, improved attention, improved awareness and improved coordination.  Patient to discharge at overall Modified Independent level. No family has been present for family education, patient is mod I with BADLs and basic IADL tasks.   Reasons goals not met: n/a, all goals met at this time.   Recommendation: No additional occupational therapy recommended at this time.   Equipment: No equipment provided; patient states she has a BSC and built-in shower seat that she feels comfortable using.   Reasons for discharge: treatment goals met and discharge from hospital  Patient/family agrees with progress made and goals achieved: Yes  Precautions/Restrictions  Precautions Precautions:  Back;Fall Precaution Booklet Issued: No Precaution Comments: patient able to independently verbalize and adhere to 3/3 back precautions Required Braces or Orthoses: Spinal Brace Spinal Brace: Lumbar corset;Applied in sitting position Other Brace/Splint: AFO Lt foot Restrictions Weight Bearing Restrictions: No  Vital Signs Therapy Vitals Temp: 98.1 F (36.7 C) Temp src: Oral Pulse Rate: 68 Resp: 18 BP: 118/67 mmHg Patient Position, if appropriate: Lying Oxygen Therapy SpO2: 99 % O2 Device: None (Room air)  Pain Pain Assessment Pain Assessment: No/denies pain Pain Score: 0-No pain  ADL ADL Equipment Provided: Reacher;Sock aid;Long-handled shoe horn Eating: Independent Grooming: Independent Upper Body Bathing: Modified independent Lower Body Bathing: Modified independent Upper Body Dressing: Independent Lower Body Dressing: Modified independent Toileting: Modified independent Toilet Transfer: Modified independent Film/video editor: Modified independent  Vision/Perception  Vision - History Baseline Vision: Wears contacts Patient Visual Report: No change from baseline Vision - Assessment Eye Alignment: Within Functional Limits Vision Assessment: Vision not tested Perception Perception: Within Functional Limits Praxis Praxis: Intact   Cognition Overall Cognitive Status: Within Functional Limits for tasks assessed Orientation Level: Oriented X4 Memory: Appears intact Awareness: Appears intact Problem Solving: Appears intact Safety/Judgment: Appears intact  Sensation Sensation Light Touch: Appears Intact Stereognosis: Appears Intact Hot/Cold: Appears Intact Proprioception: Appears Intact Coordination Gross Motor Movements are Fluid and Coordinated: Yes Fine Motor Movements are Fluid and Coordinated: Yes  Motor  Motor Motor: Within Functional Limits  Trunk/Postural Assessment  Cervical Assessment Cervical Assessment: Within Functional  Limits Thoracic Assessment Thoracic Assessment: Exceptions to [facility] (back precautions; lumbar corset) Lumbar Assessment Lumbar Assessment: Exceptions to [facility] (back precautions; lumbar corset) Postural Control Postural Control: Within Functional Limits   Balance Static Sitting Balance Static Sitting - Level of Assistance: 6: Modified independent (Device/Increase time) Static Standing Balance Static Standing - Level of Assistance: 6:  Modified independent (Device/Increase time) (with RW) Dynamic Standing Balance Dynamic Standing - Level of Assistance: 6: Modified independent (Device/Increase time) (with RW)  Extremity/Trunk Assessment RUE Assessment RUE Assessment: Within Functional Limits LUE Assessment LUE Assessment: Within Functional Limits  See FIM for current functional status  Delane Wessinger 04/25/2013, 11:32 AM

## 2013-04-25 NOTE — Progress Notes (Signed)
Called on ascom phone to go to patient room because she fell. NT was in the room when I arrived. Patient was ambulating back to her wheelchair. Patient was MOD I in the room today so she was able to help herself to the restroom. The patient explained to me that she was attempting to clean a water spill on the floor in the bathroom and loss her balance and fell and hit her head. VS taken and stable. MD on call paged and patient husband walked in to the room around 5 min after the fall occurred. Patient was very tearful after the event and was upset with herself that she fell. Will continue to monitor patient closely.

## 2013-04-25 NOTE — Progress Notes (Signed)
Physical Therapy Discharge Summary  Patient Details  Name: [name] MRN: [id number] Date of Birth: 03/18/1946  Today's Date: 04/25/2013  Session #1 Time: 4098-1191 Time Calculation (min): 55 min Individual therapy; Denies pain. Session focused on completing grad day activities. Completed all transfers, bed mobility in ADL apartment, simulated car transfer to [place] and gait on unit mod I with RW. Worked on gait without AD through obstacle course for challenge to balance with steady A; required min A for 1 LOB during obstacle course. Stair negotiation with S using rails and S for cub step management with RW; cues for which foot to lead up/down with. Introduced HEP Print production planner) for LE strengthening and balance that next therapist will review with pt in following session.   Session #2 Time: 1445-1525 (40 min) Individual therapy; Denies pain. Session focused dynamic standing balance activity using Wii Fit balance games without UE support; overall completed all tasks with S without AD but 1 episode of LOB to L with min A to recover. Cues to shift weight through LE's instead of moving upper part of trunk. Gait to/from therapy mod I with RW.   Patient has met 8 of 8 long term goals due to improved activity tolerance, improved balance, increased strength, decreased pain, ability to compensate for deficits, functional use of  left lower extremity and improved coordination.  Patient to discharge at an ambulatory level Modified Independent with RW.  Patient's care partner is independent to provide the necessary supervision assistance at discharge.  Reasons goals not met: n/a. All goals met at this time.  Recommendation:  Patient will benefit from ongoing skilled PT services in outpatient setting to continue to advance safe functional mobility, address ongoing impairments in gait, balance, functional mobility, muscular endurance and strength, and minimize fall risk.  Equipment: No equipment provided. Pt  already owns RW.  Reasons for discharge: treatment goals met and discharge from hospital  Patient/family agrees with progress made and goals achieved: Yes  PT Discharge Precautions/Restrictions Precautions Precautions: Back;Fall Precaution Booklet Issued: No Precaution Comments: patient able to independently verbalize and adhere to 3/3 back precautions Required Braces or Orthoses: Spinal Brace Spinal Brace: Lumbar corset;Applied in sitting position Other Brace/Splint: AFO Lt foot Restrictions Weight Bearing Restrictions: No   Vision/Perception  Vision - History Baseline Vision: Wears contacts Patient Visual Report: No change from baseline Vision - Assessment Eye Alignment: Within Functional Limits Vision Assessment: Vision not tested Perception Perception: Within Functional Limits Praxis Praxis: Intact  Cognition Overall Cognitive Status: Within Functional Limits for tasks assessed Orientation Level: Oriented X4 Memory: Appears intact Awareness: Appears intact Problem Solving: Appears intact Safety/Judgment: Appears intact Sensation Sensation Light Touch: Impaired by gross assessment (decreased on shin on LLE; otherwise intact) Stereognosis: Appears Intact Hot/Cold: Appears Intact Proprioception: Appears Intact Coordination Gross Motor Movements are Fluid and Coordinated: Yes (LLE decreased compared to R but WFL) Fine Motor Movements are Fluid and Coordinated: Yes Motor  Motor Motor: Within Functional Limits  Locomotion  Ambulation Ambulation/Gait Assistance: 6: Modified independent (Device/Increase time) Stairs / Additional Locomotion Curb: 5: Supervision (with RW)  Trunk/Postural Assessment  Cervical Assessment Cervical Assessment: Within Functional Limits Thoracic Assessment Thoracic Assessment: Exceptions to [facility] (back precautions; lumbar corset) Lumbar Assessment Lumbar Assessment: Exceptions to [facility] (back precautions; lumbar corset) Postural  Control Postural Control: Within Functional Limits  Balance Static Sitting Balance Static Sitting - Level of Assistance: 6: Modified independent (Device/Increase time) Static Standing Balance Static Standing - Level of Assistance: 6: Modified independent (Device/Increase time) (with RW) Dynamic  Standing Balance Dynamic Standing - Level of Assistance: 6: Modified independent (Device/Increase time) (with RW) Extremity Assessment  RUE Assessment RUE Assessment: Within Functional Limits LUE Assessment LUE Assessment: Within Functional Limits RLE Assessment RLE Assessment: Within Functional Limits LLE Assessment LLE Assessment: Exceptions to [facility] LLE Strength LLE Overall Strength Comments: premorbid decreased ankle df (uses AFO longstanding); grossly otherwise 4/5  See FIM for current functional status  Karolee Stamps [facility] 04/25/2013, 9:57 AM

## 2013-04-26 DIAGNOSIS — M47817 Spondylosis without myelopathy or radiculopathy, lumbosacral region: Secondary | ICD-10-CM

## 2013-04-26 DIAGNOSIS — N390 Urinary tract infection, site not specified: Secondary | ICD-10-CM

## 2013-04-26 DIAGNOSIS — IMO0002 Reserved for concepts with insufficient information to code with codable children: Secondary | ICD-10-CM

## 2013-04-26 MED ORDER — DEXAMETHASONE 2 MG PO TABS
2.0000 mg | ORAL_TABLET | Freq: Two times a day (BID) | ORAL | Status: DC
  Filled 2013-04-26 (×2): qty 1

## 2013-04-26 MED ORDER — BUPROPION HCL ER (SR) 150 MG PO TB12: 150.0000 mg | ORAL_TABLET | Freq: Every day | ORAL | Status: DC

## 2013-04-26 MED ORDER — DEXAMETHASONE 2 MG PO TABS
2.0000 mg | ORAL_TABLET | Freq: Two times a day (BID) | ORAL | Status: DC
  Filled 2013-04-26 (×3): qty 1

## 2013-04-26 MED ORDER — HYDROMORPHONE HCL 2 MG PO TABS: 2.0000 mg | ORAL_TABLET | ORAL | Status: DC | PRN

## 2013-04-26 MED ORDER — OXYBUTYNIN CHLORIDE 5 MG PO TABS: 10.0000 mg | ORAL_TABLET | Freq: Every day | ORAL | Status: DC

## 2013-04-26 MED ORDER — DEXAMETHASONE 2 MG PO TABS: ORAL_TABLET | ORAL | Status: DC

## 2013-04-26 MED ORDER — CIPROFLOXACIN HCL 250 MG PO TABS: 250.0000 mg | ORAL_TABLET | Freq: Two times a day (BID) | ORAL | Status: DC

## 2013-04-26 MED ORDER — OXYBUTYNIN CHLORIDE 5 MG PO TABS: 5.0000 mg | ORAL_TABLET | Freq: Every day | ORAL | Status: DC

## 2013-04-26 NOTE — Progress Notes (Signed)
Social Work  Discharge Note  The overall goal for the admission was met for:   Discharge location: Yes - home with husband who can provide intermittent assist  Length of Stay: Yes - 7 days  Discharge activity level: Yes - mod independent  Home/community participation: Yes  Services provided included: MD, RD, PT, OT, RN, Pharmacy and SW  Financial Services: Private Insurance: [employer] BCBS  Follow-up services arranged: Outpatient: PT via [facility] and Patient/Family has no preference for HH/DME agencies  Comments (or additional information):  Patient/Family verbalized understanding of follow-up arrangements: Yes  Individual responsible for coordination of the follow-up plan: patient  Confirmed correct DME delivered: NA (has all needed DME)    [name]

## 2013-04-26 NOTE — Patient Care Conference (Signed)
Inpatient RehabilitationTeam Conference and Plan of Care Update Date: 04/25/2013   Time: 2:15 PM    Patient Name: [name]      Medical Record Number: [id number]  Date of Birth: Nov 30, 1945 Sex: Female         Room/Bed: 4W23C/4W23C-01 Payor Info: Payor: BLUE CROSS BLUE SHIELD / Plan: BCBS PPO OUT OF STATE / Product Type: *No Product type* /    Admitting Diagnosis: lumbar fusion  Admit Date/Time:  04/19/2013  2:37 PM Admission Comments: No comment available   Primary Diagnosis:  Lumbar radiculopathy Principal Problem: Lumbar radiculopathy  Patient Active Problem List   Diagnosis Date Noted  . Lumbar radiculopathy 04/19/2013  . DIARRHEA 09/15/2010  . PERSONAL HISTORY OF FAILED MODERATE SEDATION 09/15/2010  . DIARRHEA-PRESUMED INFECTIOUS 07/24/2010  . HEMORRHOIDS-INTERNAL 12/10/2008  . HEMORRHOIDS-EXTERNAL 12/10/2008  . PERSONAL HX COLONIC POLYPS 12/10/2008  . COLONIC POLYPS, HYPERPLASTIC 11/22/2008  . HYPERLIPIDEMIA 11/22/2008  . ANXIETY 11/22/2008  . DEPRESSION 11/22/2008  . ABNORMAL HEART RHYTHMS 11/22/2008  . HEMORRHOIDS 11/22/2008  . GERD 11/22/2008  . HIATAL HERNIA 11/22/2008  . HYPERTENSION NEC 11/22/2008    Expected Discharge Date: Expected Discharge Date: 04/26/13  Team Members Present: Physician leading conference: Dr. Faith Rogue Social Worker Present: Amada Jupiter, LCSW Nurse Present: Other (comment) (Melissa Ramgeet, RN) PT Present: Karolee Stamps, Jerrye Bushy, PT OT Present: Edwin Cap, Loistine Chance, OT PPS Coordinator present : Tora Duck, RN, CRRN;Becky Henrene Dodge, PT     Current Status/Progress Goal Weekly Team Focus  Medical   luimbar ddd with radiculopathy, post-op encephalopathy  medically stabilize for dc  see prior, rx uti and pain   Bowel/Bladder   Continent of bowel and bladder  Remain continent of bowel and bladder  Monitor    Swallow/Nutrition/ Hydration             ADL's   overall mod I  overall mod I  D/C planning    Mobility   S/mod I for basic transfers; S gait with RW; steady A for stairs  mod I overall; S car transfers and stairs  d/c planning, gait training, higher level balance, stairs   Communication             Safety/Cognition/ Behavioral Observations            Pain   pain controled with dilaudid 2-4mg , robaxin 500mg   pain < or =3  Monitor pain q shift and prn   Skin   Incision to lower back healing  No skin infection/breakdown  [place] skin q shift and prn      *See Care Plan and progress notes for long and short-term goals.  Barriers to Discharge: none    Possible Resolutions to Barriers:  none    Discharge Planning/Teaching Needs:  Home with husband available to provide intermittent support      Team Discussion:  Has made excellent gains and ready for d/c tomorrow at mod i level of function.  Independent in room today.  Revisions to Treatment Plan:  None   Continued Need for Acute Rehabilitation Level of Care: The patient requires daily medical management by a physician with specialized training in physical medicine and rehabilitation for the following conditions: Daily direction of a multidisciplinary physical rehabilitation program to ensure safe treatment while eliciting the highest outcome that is of practical value to the patient.: Yes Daily medical management of patient stability for increased activity during participation in an intensive rehabilitation regime.: Yes Daily analysis of laboratory values and/or radiology reports  with any subsequent need for medication adjustment of medical intervention for : Neurological problems;Post surgical problems  [name] 04/26/2013, 9:28 AM

## 2013-04-26 NOTE — Progress Notes (Signed)
Discharge instructions given by Harvel Ricks, PA with verbal understanding. Discharged to home with husband, belongings with patient.

## 2013-04-26 NOTE — Progress Notes (Signed)
Subjective/Complaints: Slipped on water in bathroom yesterday. Has welt on back of head but feeling ok this am. Denies back pain. Was wearing back brace when incident occurred. A 12 point review of systems has been performed and if not noted above is otherwise negative.   Objective: Vital Signs: Blood pressure 131/58, pulse 76, temperature 97.7 F (36.5 C), temperature source Oral, resp. rate 18, SpO2 100.00%. No results found. No results found for this basename: WBC, HGB, HCT, PLT,  in the last 72 hours No results found for this basename: NA, K, CL, CO, GLUCOSE, BUN, CREATININE, CALCIUM,  in the last 72 hours CBG (last 3)  No results found for this basename: GLUCAP,  in the last 72 hours  Wt Readings from Last 3 Encounters:  04/13/13 87.5 kg (192 lb 14.4 oz)  04/13/13 87.5 kg (192 lb 14.4 oz)  03/31/13 87.454 kg (192 lb 12.8 oz)    Physical Exam:  General: Alert and oriented x 3, No apparent distress . She made good eye contact with examiner. She did have some difficulty recalling the past few days but long-term memory was intact  HEENT: Head is normocephalic, atraumatic, PERRLA, EOMI, sclera anicteric, oral mucosa pink and moist, dentition intact, ext ear canals clear,  Neck: Supple without JVD or lymphadenopathy  Heart: Reg rate and rhythm. No murmurs rubs or gallops  Chest: CTA bilaterally without wheezes, rales, or rhonchi; no distress  Abdomen: Soft, non-tender, non-distended, bowel sounds positive.  Extremities: No clubbing, cyanosis, or edema. Pulses are 2+  Skin: Clean and intact without signs of breakdown and Steri-Strips in place. No drainage  Neuro: Mild LLE weakness, heel cord contracture, mild left deltoid weakness, some pain inhibition. Otherwise strength was 5 RUE, 4+ LUE, 3-4/5 RLE and 3/5 LLE prox greater than distal.  No focal CN abnormalities.  Musculoskeletal: back expectedly tender. Bump on left/occipital area Psych: Pt's affect is appropriate. Pt is cooperative  with exam   Assessment/Plan: 1. Functional deficits secondary to lumbar DDD with stenosis and radiculopathy s/p L3-5 fusion/lami which require 3+ hours per day of interdisciplinary therapy in a comprehensive inpatient rehab setting. Physiatrist is providing close team supervision and 24 hour management of active medical problems listed below. Physiatrist and rehab team continue to assess barriers to discharge/monitor patient progress toward functional and medical goals.   FIM: FIM - Bathing Bathing Steps Patient Completed: Chest;Right Arm;Left Arm;Abdomen;Front perineal area;Buttocks;Right upper leg;Left upper leg;Right lower leg (including foot);Left lower leg (including foot) Bathing: 6: Assistive device (Comment)  FIM - Upper Body Dressing/Undressing Upper body dressing/undressing steps patient completed: Thread/unthread right sleeve of pullover shirt/dresss;Thread/unthread left sleeve of pullover shirt/dress;Put head through opening of pull over shirt/dress;Pull shirt over trunk Upper body dressing/undressing: 7: Complete Independence: No helper FIM - Lower Body Dressing/Undressing Lower body dressing/undressing steps patient completed: Thread/unthread right underwear leg;Pull underwear up/down;Thread/unthread right pants leg;Thread/unthread left underwear leg;Thread/unthread left pants leg;Pull pants up/down;Don/Doff right sock;Don/Doff left sock;Don/Doff right shoe;Don/Doff left shoe Lower body dressing/undressing: 6: Assistive device (Comment)  FIM - Toileting Toileting steps completed by patient: Adjust clothing prior to toileting;Performs perineal hygiene;Adjust clothing after toileting Toileting Assistive Devices: Grab bar or rail for support Toileting: 6: Assistive device: No helper  FIM - Diplomatic Services operational officer Devices: Grab bars;Walker;Elevated toilet seat Toilet Transfers: 6-Assistive device: No helper  FIM - Landscape architect Devices: Therapist, occupational: 6: Assistive device: no helper  FIM - Locomotion: Wheelchair Locomotion: Wheelchair: 0: Activity did not occur (gait primary means) FIM -  Locomotion: Ambulation Locomotion: Ambulation Assistive Devices: Orthosis;Walker - Rolling Ambulation/Gait Assistance: 6: Modified independent (Device/Increase time) Locomotion: Ambulation: 6: Travels 150 ft or more independently/takes more than reasonable amount of time  Comprehension Comprehension Mode: Auditory Comprehension: 7-Follows complex conversation/direction: With no assist  Expression Expression Mode: Verbal Expression: 7-Expresses complex ideas: With no assist  Social Interaction Social Interaction: 7-Interacts appropriately with others - No medications needed.  Problem Solving Problem Solving: 6-Solves complex problems: With extra time  Memory Memory: 6-More than reasonable amt of time  Medical Problem List and Plan:  1. lumbar DDD with stenosis and radiculopathy. Status post L3-5 fusion, laminectomy, prior brain tumor resection   -decadron taper to off 2. DVT Prophylaxis/Anticoagulation: SCDs.   3. Pain Management: Neurontin 300 mg 3 times a day, Dilaudid 2-4 mg every 4 hours as needed and Robaxin as needed.  Good balance at present.  4. Mood/depression. Zoloft 50 mg daily, Wellbutrin 200 mg daily---decrease to 150mg  per pt request 5. Neuropsych: This patient is capable of making decisions on her own behalf.  6. Hypertension. Hydrochlorothiazide 25 mg daily.  Good control in general 7. Hypothyroidism. Synthroid  8. Hyperlipidemia. Lipitor  9. Constipation. Adjust bowel program as needed  10. Uro:    -100k e coli, sensitive to cipro  -increased ditropan to 8m qam and 10mg  qhs---can continue upon dc  -encouraged pt again that this should continue to improve after dc 11. Fall-  Other than area on occiput, no sequelae, no signs of concussion. Back feeling fine. Neuro exam  stable  LOS (Days) 7 A FACE TO FACE EVALUATION WAS PERFORMED  Fallen Crisostomo T 04/26/2013 8:45 AM

## 2013-04-26 NOTE — Progress Notes (Signed)
Physical Therapy Note  Patient Details  Name: [name] MRN: [id number] Date of Birth: Aug 19, 1945 Today's Date: 04/26/2013  Education with pt and pt's husband on floor transfers, what to do in case of a fall (pt had unassisted fall last night in bathroom), and returned demonstrated technique (required max A) and reviewed how to maintain back precautions. Pt and husband verbalized understanding and feel confident with d/c.  Karolee Stamps [facility] 04/26/2013, 10:18 AM

## 2013-04-27 ENCOUNTER — Ambulatory Visit: Payer: BC Managed Care – PPO | Attending: Physical Medicine & Rehabilitation | Admitting: Physical Therapy

## 2013-04-27 DIAGNOSIS — IMO0001 Reserved for inherently not codable concepts without codable children: Secondary | ICD-10-CM | POA: Insufficient documentation

## 2013-04-27 DIAGNOSIS — R269 Unspecified abnormalities of gait and mobility: Secondary | ICD-10-CM | POA: Insufficient documentation

## 2013-05-01 ENCOUNTER — Ambulatory Visit: Payer: BC Managed Care – PPO | Admitting: Physical Therapy

## 2013-05-04 ENCOUNTER — Ambulatory Visit: Payer: BC Managed Care – PPO | Admitting: Physical Therapy

## 2013-05-09 ENCOUNTER — Ambulatory Visit: Payer: BC Managed Care – PPO | Admitting: Physical Therapy

## 2013-05-11 ENCOUNTER — Ambulatory Visit: Payer: BC Managed Care – PPO | Admitting: Physical Therapy

## 2013-05-16 ENCOUNTER — Ambulatory Visit: Payer: BC Managed Care – PPO | Admitting: Physical Therapy

## 2013-05-18 ENCOUNTER — Ambulatory Visit: Payer: BC Managed Care – PPO | Admitting: Physical Therapy

## 2013-05-23 ENCOUNTER — Ambulatory Visit: Payer: BC Managed Care – PPO | Admitting: Physical Therapy

## 2013-05-25 ENCOUNTER — Ambulatory Visit: Payer: BC Managed Care – PPO | Admitting: Physical Therapy

## 2014-04-25 ENCOUNTER — Encounter: Payer: Self-pay | Admitting: Internal Medicine

## 2014-04-25 ENCOUNTER — Ambulatory Visit (INDEPENDENT_AMBULATORY_CARE_PROVIDER_SITE_OTHER): Payer: Medicare Other | Admitting: Internal Medicine

## 2014-04-25 VITALS — BP 132/72 | HR 84 | Ht 63.5 in | Wt 201.4 lb

## 2014-04-25 DIAGNOSIS — K649 Unspecified hemorrhoids: Secondary | ICD-10-CM

## 2014-04-25 DIAGNOSIS — K219 Gastro-esophageal reflux disease without esophagitis: Secondary | ICD-10-CM

## 2014-04-25 DIAGNOSIS — K5289 Other specified noninfective gastroenteritis and colitis: Secondary | ICD-10-CM

## 2014-04-25 DIAGNOSIS — R197 Diarrhea, unspecified: Secondary | ICD-10-CM

## 2014-04-25 DIAGNOSIS — Z86010 Personal history of colonic polyps: Secondary | ICD-10-CM

## 2014-04-25 DIAGNOSIS — K52831 Collagenous colitis: Secondary | ICD-10-CM

## 2014-04-25 DIAGNOSIS — K641 Second degree hemorrhoids: Secondary | ICD-10-CM

## 2014-04-25 MED ORDER — HYDROCORTISONE ACETATE 0.5 % EX CREA: 1.0000 "application " | TOPICAL_CREAM | Freq: Every day | CUTANEOUS | Status: DC | PRN

## 2014-04-25 MED ORDER — DIPHENOXYLATE-ATROPINE 2.5-0.025 MG PO TABS: 1.0000 | ORAL_TABLET | Freq: Two times a day (BID) | ORAL | Status: DC

## 2014-04-25 MED ORDER — HYDROCORTISONE ACETATE 25 MG RE SUPP: 25.0000 mg | Freq: Every day | RECTAL | Status: DC

## 2014-04-25 NOTE — Progress Notes (Signed)
HISTORY OF PRESENT ILLNESS:  [name] is a 68 y.o. female with multiple medical problems as listed below. She has been followed in this office for GERD, adenomatous colon polyps, symptomatic hemorrhoids, and lymphocytic colitis. She was last evaluated in the office 06/28/2012. See that dictation for details. She was on Entocort for lymphocytic colitis as well as on demand Lomotil. She was taking Nexium for GERD with control of symptoms. Finally, medicated suppositories and cream for hemorrhoids. She was to followup in 6 weeks, but follows up at this time. Patient tells me that she discontinued Entocort some weeks after her office visit. She has not been using this for greater than one year. She has problems with diarrhea approximately once per month for which she takes Lomotil for about 2 days. She finds this manageable. Next, she continues on Nexium 40 mg daily for GERD. However, her current prescription is about to expire. She has a new Buyer, retail and requests an option that is less affordable. She did not have success with Prilosec previously. She does inquire about over-the-counter Nexium, as she may find the cost acceptable. She does continue occasional problems with hemorrhoids with diarrhea for which she requests refill of her topical therapies. Last colonoscopy was performed March 2012. No polyps at that time. Followup in 5 years recommended. There is a family history of colon cancer in her mother in her 34s.  REVIEW OF SYSTEMS:  All non-GI ROS negative except for sinus and allergy, arthritis, back pain, fatigue, night sweats, mild insomnia  Past Medical History  Diagnosis Date  . Benign neoplasm of colon   . Depressive disorder, not elsewhere classified   . Anxiety state, unspecified   . Other and unspecified hyperlipidemia   . Esophageal reflux   . Obesity   . Hypothyroidism   . Hemorrhoids   . Colon polyps     hyperplastic  . Family history of colon cancer mother  .  Lymphocytic colitis   . Contact lens/glasses fitting     wears glassses or contacts  . Complications affecting other specified body systems, hypertension     No cardiologist   . Seizures     x 1  . Brain tumor     Past Surgical History  Procedure Laterality Date  . Craniotomy    . Back surgery    . Abdominal hysterectomy    . Appendectomy    . Carpal tunnel release      right  . Brain surgery  1999    benign tumor  . Hammertoe reconstruction with weil osteotomy  07/07/2012    Procedure: HAMMERTOE RECONSTRUCTION WITH WEIL OSTEOTOMY;  Surgeon: Wylene Simmer, MD;  Location: [facility];  Service: Orthopedics;  Laterality: Left;  Left Second through [place]; Left Fifth Toe Flexor Tendon Release, and Left Second through [place]  reports that she quit smoking about 3 years ago. She has never used smokeless tobacco. She reports that she drinks about 3.5 ounces of alcohol per week. She reports that she does not use illicit drugs.  family history includes Bone cancer in her maternal grandmother; Breast cancer in her mother; Colon cancer in her mother; Heart attack in her father; Lung cancer in her maternal uncle.  Allergies  Allergen Reactions  . Codeine Other (See Comments)    nightmares  . Hydrocodone-Acetaminophen Itching    Has to take benadryl with to tolerate  . Hydrocodone Itching  Has to take benadryl to tolerate med  . Sulfonamide Derivatives Rash       PHYSICAL EXAMINATION: Vital signs: BP 132/72  Pulse 84  Ht 5' 3.5" (1.613 m)  Wt 201 lb 6 oz (91.343 kg)  BMI 35.11 kg/m2 General: Well-developed, obese, well-nourished, no acute distress. Slow moving HEENT: Sclerae are anicteric, conjunctiva pink. Oral mucosa intact Lungs: Clear Heart: Regular Abdomen: soft, obese, nontender, nondistended, no obvious ascites, no peritoneal signs, normal bowel sounds. No organomegaly. Extremities: No  edema Psychiatric: alert and oriented x3. Cooperative   ASSESSMENT:  #1. Lymphocytic colitis. Minimal problems with diarrhea as described. Not needing Entocort #2. Symptomatic hemorrhoids. Intermittent problem #3. GERD. Asymptomatic on Nexium. Medication cost issues as described #4. Personal history of adenomatous colon polyps and family history of colon cancer in her mother (97s). Last colonoscopy March 2012.   PLAN:  #1. Refill Lomotil as requested #2. Refill Anusol and hydrocortisone cream as requested #3. Try Nexium over-the-counter. Start at low dose then increased if needed. If this does not work, contact the office for alternate PPI options #4. Surveillance colonoscopy around March 2017 #5. Routine GI office followup in 1 year. Sooner if needed

## 2014-04-25 NOTE — Patient Instructions (Signed)
We have sent the following medications to your pharmacy for you to pick up at your convenience:  Lomotil, Anusol Suppositories, Hydrocortisone cream  Please follow up with Dr. [name] in one year

## 2014-06-25 ENCOUNTER — Other Ambulatory Visit (INDEPENDENT_AMBULATORY_CARE_PROVIDER_SITE_OTHER): Payer: Medicare Other

## 2014-06-25 ENCOUNTER — Encounter: Payer: Self-pay | Admitting: Internal Medicine

## 2014-06-25 ENCOUNTER — Ambulatory Visit (INDEPENDENT_AMBULATORY_CARE_PROVIDER_SITE_OTHER): Payer: Medicare Other | Admitting: Internal Medicine

## 2014-06-25 VITALS — BP 140/70 | HR 72 | Ht 63.5 in | Wt 205.5 lb

## 2014-06-25 DIAGNOSIS — D509 Iron deficiency anemia, unspecified: Secondary | ICD-10-CM

## 2014-06-25 DIAGNOSIS — K219 Gastro-esophageal reflux disease without esophagitis: Secondary | ICD-10-CM

## 2014-06-25 DIAGNOSIS — K5289 Other specified noninfective gastroenteritis and colitis: Secondary | ICD-10-CM

## 2014-06-25 DIAGNOSIS — K52831 Collagenous colitis: Secondary | ICD-10-CM

## 2014-06-25 LAB — IGA: IgA: 121 mg/dL (ref 68–378)

## 2014-06-25 MED ORDER — MOVIPREP 100 G PO SOLR: 1.0000 | Freq: Once | ORAL | Status: DC

## 2014-06-25 NOTE — Patient Instructions (Signed)
Your physician has requested that you go to the basement for the following lab work before leaving today:  TTG, IGA  Please take an over the counter iron supplement daily    You have been scheduled for an endoscopy and colonoscopy. Please follow the written instructions given to you at your visit today. Please pick up your prep at the pharmacy within the next 1-3 days. If you use inhalers (even only as needed), please bring them with you on the day of your procedure. Your physician has requested that you go to www.startemmi.com and enter the access code given to you at your visit today. This web site gives a general overview about your procedure. However, you should still follow specific instructions given to you by our office regarding your preparation for the procedure.

## 2014-06-25 NOTE — Progress Notes (Signed)
HISTORY OF PRESENT ILLNESS:  [name] is a 68 y.o. female with multiple medical problems as listed below. She has been seen in this office for GERD, symptomatic hemorrhoids, lymphocytic colitis, and adenomatous colon polyps. She was last evaluated 04/25/2014. See that dictation. Patient is sent today by her primary care provider regarding newly diagnosed iron deficiency anemia. Laboratories from 05/01/2014 revealed anemia with hemoglobin of 10.8. MCV 83.1. Normal white blood cell count and platelets. Normal thyroid studies. Ferritin level was low at 7.3. Normal comprehensive metabolic panel. Iron saturation 15%. Patient did have nonspecific fatigue. She is not on iron supplementation. Her last colonoscopy was March 2012 revealed normal colon and terminal ileum. Internal and external hemorrhoids noted. Random colon biopsies revealed lymphocytic colitis. In addition to a personal history of adenomatous polyps, she has family history of colon cancer in her mother in her 37s. The patient has not had prior EGD. She continues to have intermittent rectal bleeding from hemorrhoids. No black or tarry stools. She does take aspirin and Relafen daily. For her acid reflux she continues on over-the-counter PPI once or twice daily. Nonspecific bloating. She is accompanied today by her husband Antony Haste  REVIEW OF SYSTEMS:  All non-GI ROS negative except for fatigue, arthritis, muscle cramps, sleeping problems, back pain,  Past Medical History  Diagnosis Date  . Benign neoplasm of colon   . Depressive disorder, not elsewhere classified   . Anxiety state, unspecified   . Other and unspecified hyperlipidemia   . Esophageal reflux   . Obesity   . Hypothyroidism   . Hemorrhoids   . Colon polyps     hyperplastic  . Family history of colon cancer mother  . Lymphocytic colitis   . Contact lens/glasses fitting     wears glassses or contacts  . Complications affecting other specified body systems, hypertension    No cardiologist   . Seizures     x 1  . Brain tumor     Past Surgical History  Procedure Laterality Date  . Craniotomy    . Back surgery    . Abdominal hysterectomy    . Appendectomy    . Carpal tunnel release      right  . Brain surgery  1999    benign tumor  . Hammertoe reconstruction with weil osteotomy  07/07/2012    Procedure: HAMMERTOE RECONSTRUCTION WITH WEIL OSTEOTOMY;  Surgeon: Wylene Simmer, MD;  Location: [facility];  Service: Orthopedics;  Laterality: Left;  Left Second through [place]; Left Fifth Toe Flexor Tendon Release, and Left Second through [place]  reports that she quit smoking about 3 years ago. She has never used smokeless tobacco. She reports that she drinks about 3.5 oz of alcohol per week. She reports that she does not use illicit drugs.  family history includes Bone cancer in her maternal grandmother; Breast cancer in her mother; Colon cancer in her mother; Heart attack in her father; Lung cancer in her maternal uncle.  Allergies  Allergen Reactions  . Codeine Other (See Comments)    nightmares  . Hydrocod Polst-Cpm Polst Er Itching  . Hydrocodone-Acetaminophen Itching    Has to take benadryl with to tolerate  . Sulfa Antibiotics   . Hydrocodone Itching    Has to take benadryl to tolerate med  . Sulfonamide Derivatives Rash       PHYSICAL EXAMINATION: Vital signs: BP 140/70 mmHg  Pulse 72  Ht 5'  3.5" (1.613 m)  Wt 205 lb 8 oz (93.214 kg)  BMI 35.83 kg/m2  Constitutional: generally well-appearing, no acute distress Psychiatric: alert and oriented x3, cooperative Eyes: extraocular movements intact, anicteric, conjunctiva pink Mouth: oral pharynx moist, no lesions Neck: supple no lymphadenopathy Cardiovascular: heart regular rate and rhythm, no murmur Lungs: clear to auscultation bilaterally Abdomen: soft, nontender, nondistended, no obvious ascites, no  peritoneal signs, normal bowel sounds, no organomegaly Rectal: Deferred until colonoscopy Extremities: no lower extremity edema bilaterally Skin: no lesions on visible extremities Neuro: No focal deficits.   ASSESSMENT:  #1. Iron deficiency anemia #2. History of lymphocytic colitis #3. GERD, on PPI #4. Personal history of adenomatous colon polyps (2009). Most recent examination 2012 without polyps #5. Family history of colon cancer in the patient's mother-50s   PLAN:  #1. Tissue transglutaminase antibody IgA a and serum IgA level to screen for celiac disease as a possible cause for iron deficiency anemia in a patient with a history of lymphocytic colitis #2. Initiate over-the-counter iron supplement once daily #3. Schedule colonoscopy and upper endoscopy to evaluate iron deficiency anemia.The nature of the procedure, as well as the risks, benefits, and alternatives were carefully and thoroughly reviewed with the patient. Ample time for discussion and questions allowed. The patient understood, was satisfied, and agreed to proceed.

## 2014-06-26 LAB — TISSUE TRANSGLUTAMINASE, IGA: Tissue Transglutaminase Ab, IgA: 1 U/mL (ref <4)

## 2014-08-02 ENCOUNTER — Other Ambulatory Visit (INDEPENDENT_AMBULATORY_CARE_PROVIDER_SITE_OTHER): Payer: Medicare Other

## 2014-08-02 ENCOUNTER — Other Ambulatory Visit: Payer: Self-pay | Admitting: *Deleted

## 2014-08-02 ENCOUNTER — Ambulatory Visit (AMBULATORY_SURGERY_CENTER): Payer: Medicare Other | Admitting: Internal Medicine

## 2014-08-02 ENCOUNTER — Encounter: Payer: Self-pay | Admitting: Internal Medicine

## 2014-08-02 VITALS — BP 150/74 | HR 65 | Temp 96.6°F | Resp 20 | Ht 63.0 in | Wt 205.0 lb

## 2014-08-02 DIAGNOSIS — K222 Esophageal obstruction: Secondary | ICD-10-CM

## 2014-08-02 DIAGNOSIS — D509 Iron deficiency anemia, unspecified: Secondary | ICD-10-CM

## 2014-08-02 DIAGNOSIS — D122 Benign neoplasm of ascending colon: Secondary | ICD-10-CM

## 2014-08-02 DIAGNOSIS — K219 Gastro-esophageal reflux disease without esophagitis: Secondary | ICD-10-CM

## 2014-08-02 DIAGNOSIS — D120 Benign neoplasm of cecum: Secondary | ICD-10-CM

## 2014-08-02 DIAGNOSIS — D126 Benign neoplasm of colon, unspecified: Secondary | ICD-10-CM

## 2014-08-02 DIAGNOSIS — R197 Diarrhea, unspecified: Secondary | ICD-10-CM

## 2014-08-02 DIAGNOSIS — K317 Polyp of stomach and duodenum: Secondary | ICD-10-CM

## 2014-08-02 LAB — CBC WITH DIFFERENTIAL/PLATELET
Basophils Absolute: 0.1 10*3/uL (ref 0.0–0.1)
Basophils Relative: 0.8 % (ref 0.0–3.0)
Eosinophils Absolute: 0.2 10*3/uL (ref 0.0–0.7)
Eosinophils Relative: 3.3 % (ref 0.0–5.0)
HCT: 37.4 % (ref 36.0–46.0)
HEMOGLOBIN: 11.9 g/dL — AB (ref 12.0–15.0)
LYMPHS ABS: 1.4 10*3/uL (ref 0.7–4.0)
Lymphocytes Relative: 23.3 % (ref 12.0–46.0)
MCHC: 31.7 g/dL (ref 30.0–36.0)
MCV: 89.9 fl (ref 78.0–100.0)
MONO ABS: 0.5 10*3/uL (ref 0.1–1.0)
MONOS PCT: 8 % (ref 3.0–12.0)
Neutro Abs: 4 10*3/uL (ref 1.4–7.7)
Neutrophils Relative %: 64.6 % (ref 43.0–77.0)
Platelets: 238 10*3/uL (ref 150.0–400.0)
RBC: 4.16 Mil/uL (ref 3.87–5.11)
RDW: 22.3 % — AB (ref 11.5–15.5)
WBC: 6.2 10*3/uL (ref 4.0–10.5)

## 2014-08-02 LAB — FERRITIN: Ferritin: 38.7 ng/mL (ref 10.0–291.0)

## 2014-08-02 MED ORDER — ESOMEPRAZOLE MAGNESIUM 40 MG PO CPDR: 40.0000 mg | DELAYED_RELEASE_CAPSULE | Freq: Every day | ORAL | Status: DC

## 2014-08-02 MED ORDER — SODIUM CHLORIDE 0.9 % IV SOLN: 500.0000 mL | INTRAVENOUS | Status: DC

## 2014-08-02 NOTE — Op Note (Signed)
[place]  [address] [place], [id number]   COLONOSCOPY PROCEDURE REPORT  PATIENT: [name], [name]  MR#: [id number] BIRTHDATE: 12/15/1945 , 68  yrs. old GENDER: female ENDOSCOPIST: Eustace Quail, MD REFERRED BY:.  Self / Office PROCEDURE DATE:  08/02/2014 PROCEDURE:   Colonoscopy with snare polypectomy x 2 First Screening Colonoscopy - Avg.  risk and is 50 yrs.  old or older - No.  Prior Negative Screening - Now for repeat screening. N/A  History of Adenoma - Now for follow-up colonoscopy & has been > or = to 3 yrs.  Yes hx of adenoma.  Has been 3 or more years since last colonoscopy.  Polyps Removed Today? Yes. ASA CLASS:   Class II INDICATIONS:iron deficiency anemia, surveillance colonoscopy based on a history of adenomatous colonic polyps) 2009 with adenomas; March 2012 negative), and patient's mother (44s) with history of colon cancer. MEDICATIONS: Monitored anesthesia care and Propofol 400 mg IV  DESCRIPTION OF PROCEDURE:   After the risks benefits and alternatives of the procedure were thoroughly explained, informed consent was obtained.  The digital rectal exam revealed no abnormalities of the rectum.   The LB [id number]  endoscope was introduced through the anus and advanced to the cecum, which was identified by both the appendix and ileocecal valve. No adverse events experienced.   The quality of the prep was good, using MoviPrep  The instrument was then slowly withdrawn as the colon was fully examined.  COLON FINDINGS: Two polyps ranging between 5-60mm in size were found in the ascending colon and at the cecum.  A polypectomy was performed with a cold snare.  The resection was complete, the polyp tissue was completely retrieved and sent to histology.   The examination was otherwise normal.  Retroflexed views revealed internal hemorrhoids. The time to cecum=7 minutes 13 seconds. Withdrawal time=18 minutes 29 seconds.  The scope was  withdrawn and the procedure completed.  COMPLICATIONS: There were no immediate complications.  ENDOSCOPIC IMPRESSION: 1.   Two polyps  were found in the ascending colon and at the cecum; polypectomy was performed with a cold snare 2.   The examination was otherwise normal  RECOMMENDATIONS: 1.  Follow up colonoscopy in 5 years 2.  Upper endoscopy today (please see report)  eSigned:  Eustace Quail, MD 08/02/2014 2:12 PM   cc: Rolan Lipa, MD and The Patient

## 2014-08-02 NOTE — Progress Notes (Signed)
Report to PACU, RN, vss, BBS= Clear.  

## 2014-08-02 NOTE — Patient Instructions (Signed)
YOU HAD AN ENDOSCOPIC PROCEDURE TODAY AT THE [facility]: Refer to the procedure report that was given to you for any specific questions about what was found during the examination.  If the procedure report does not answer your questions, please call your gastroenterologist to clarify.  If you requested that your care partner not be given the details of your procedure findings, then the procedure report has been included in a sealed envelope for you to review at your convenience later.  YOU SHOULD EXPECT: Some feelings of bloating in the abdomen. Passage of more gas than usual.  Walking can help get rid of the air that was put into your GI tract during the procedure and reduce the bloating. If you had a lower endoscopy (such as a colonoscopy or flexible sigmoidoscopy) you may notice spotting of blood in your stool or on the toilet paper. If you underwent a bowel prep for your procedure, then you may not have a normal bowel movement for a few days.  DIET: Your first meal following the procedure should be a light meal and then it is ok to progress to your normal diet.  A half-sandwich or bowl of soup is an example of a good first meal.  Heavy or fried foods are harder to digest and may make you feel nauseous or bloated.  Likewise meals heavy in dairy and vegetables can cause extra gas to form and this can also increase the bloating.  Drink plenty of fluids but you should avoid alcoholic beverages for 24 hours.  ACTIVITY: Your care partner should take you home directly after the procedure.  You should plan to take it easy, moving slowly for the rest of the day.  You can resume normal activity the day after the procedure however you should NOT DRIVE or use heavy machinery for 24 hours (because of the sedation medicines used during the test).    SYMPTOMS TO REPORT IMMEDIATELY: A gastroenterologist can be reached at any hour.  During normal business hours, 8:30 AM to 5:00 PM Monday through Friday,  call [phone].  After hours and on weekends, please call the GI answering service at [phone] who will take a message and have the physician on call contact you.   Following lower endoscopy (colonoscopy or flexible sigmoidoscopy):  Excessive amounts of blood in the stool  Significant tenderness or worsening of abdominal pains  Swelling of the abdomen that is new, acute  Fever of 100F or higher  Following upper endoscopy (EGD)  Vomiting of blood or coffee ground material  New chest pain or pain under the shoulder blades  Painful or persistently difficult swallowing  New shortness of breath  Fever of 100F or higher  Black, tarry-looking stools  FOLLOW UP: If any biopsies were taken you will be contacted by phone or by letter within the next 1-3 weeks.  Call your gastroenterologist if you have not heard about the biopsies in 3 weeks.  Our staff will call the home number listed on your records the next business day following your procedure to check on you and address any questions or concerns that you may have at that time regarding the information given to you following your procedure. This is a courtesy call and so if there is no answer at the home number and we have not heard from you through the emergency physician on call, we will assume that you have returned to your regular daily activities without incident.  SIGNATURES/CONFIDENTIALITY: You and/or your care   partner have signed paperwork which will be entered into your electronic medical record.  These signatures attest to the fact that that the information above on your After Visit Summary has been reviewed and is understood.  Full responsibility of the confidentiality of this discharge information lies with you and/or your care-partner.  Please, read all of the handouts given to you by your recovery room nurse.   You will be sent to the lab today for bloodwork.  Dr. [name] will call you with the results.

## 2014-08-02 NOTE — Op Note (Signed)
[place]  [address]. [place], [id number]   ENDOSCOPY PROCEDURE REPORT  PATIENT: Mackenzie, Brady  MR#: [id number] BIRTHDATE: [birth year] , 68  yrs. old GENDER: female ENDOSCOPIST: Eustace Quail, MD REFERRED BY:  .  Self / Office PROCEDURE DATE:  08/02/2014 PROCEDURE:  EGD w/ biopsy ASA CLASS:     Class II INDICATIONS:  iron deficiency anemia. MEDICATIONS: Monitored anesthesia care and Propofol 100 mg IV    ; lidocaine 150 mg IV TOPICAL ANESTHETIC: none  DESCRIPTION OF PROCEDURE: After the risks benefits and alternatives of the procedure were thoroughly explained, informed consent was obtained.  The LB [id number] endoscope was introduced through the mouth and advanced to the second portion of the duodenum , Without limitations.  The instrument was slowly withdrawn as the mucosa was fully examined.  EXAM:Esophagus revealed a benign large caliber ring at the gastroesophageal junction.  The stomach revealed multiple benign-appearing fundic gland type polyps.  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  There was a moderate sized hiatal hernia without active Cameron erosions.  The duodenal bulb and post bulbar duodenum were normal.  Multiple duodenal biopsies taken.  Retroflexed views revealed a hiatal hernia. The scope was then withdrawn from the patient and the procedure completed.  COMPLICATIONS: There were no immediate complications.  ENDOSCOPIC IMPRESSION: 1. Distal esophageal ring 2. Benign fundic gland polyps 3. 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 Hiatal hernia without active camera erosions  RECOMMENDATIONS: 1.  Await biopsy results. Dr. [name] will send you a letter in a few weeks 2.  Continue iron once daily 3. Repeat CBC and ferritin level today. We will contact you with results when available  REPEAT EXAM:  eSigned:  Eustace Quail, MD 08/02/2014 2:19 PM    OV:[id number] [name], MD and The Patient

## 2014-08-02 NOTE — Progress Notes (Signed)
Called to room to assist during endoscopic procedure.  Patient ID and intended procedure confirmed with present staff. Received instructions for my participation in the procedure from the performing physician.  

## 2014-08-03 ENCOUNTER — Telehealth: Payer: Self-pay | Admitting: Internal Medicine

## 2014-08-03 ENCOUNTER — Telehealth: Payer: Self-pay

## 2014-08-03 ENCOUNTER — Other Ambulatory Visit: Payer: Self-pay

## 2014-08-03 DIAGNOSIS — D509 Iron deficiency anemia, unspecified: Secondary | ICD-10-CM

## 2014-08-03 NOTE — Telephone Encounter (Signed)
  Follow up Call-  Call back number 08/02/2014  Post procedure Call Back phone  # [phone]  Permission to leave phone message Yes     Patient questions:  Do you have a fever, pain , or abdominal swelling? No. Pain Score  0 *  Have you tolerated food without any problems? Yes.    Have you been able to return to your normal activities? Yes.    Do you have any questions about your discharge instructions: Diet   No. Medications  No. Follow up visit  No.  Do you have questions or concerns about your Care? No.  Actions: * If pain score is 4 or above: No action needed, pain <4.

## 2014-08-03 NOTE — Telephone Encounter (Signed)
Orders are in epic, pt aware.

## 2014-08-08 ENCOUNTER — Encounter: Payer: Self-pay | Admitting: Internal Medicine

## 2014-10-01 ENCOUNTER — Telehealth: Payer: Self-pay

## 2014-10-01 NOTE — Telephone Encounter (Signed)
-----   Message from Maury Dus, RN sent at 08/03/2014  2:39 PM EST ----- Regarding: Labs Pt needs labs, orders in.

## 2014-10-01 NOTE — Telephone Encounter (Signed)
Pt aware.

## 2014-10-04 ENCOUNTER — Other Ambulatory Visit (INDEPENDENT_AMBULATORY_CARE_PROVIDER_SITE_OTHER): Payer: Medicare Other

## 2014-10-04 DIAGNOSIS — D509 Iron deficiency anemia, unspecified: Secondary | ICD-10-CM

## 2014-10-04 LAB — CBC WITH DIFFERENTIAL/PLATELET
Basophils Absolute: 0 10*3/uL (ref 0.0–0.1)
Basophils Relative: 0.4 % (ref 0.0–3.0)
EOS ABS: 0.4 10*3/uL (ref 0.0–0.7)
Eosinophils Relative: 5.3 % — ABNORMAL HIGH (ref 0.0–5.0)
HEMATOCRIT: 39.2 % (ref 36.0–46.0)
Hemoglobin: 13.4 g/dL (ref 12.0–15.0)
LYMPHS PCT: 25.1 % (ref 12.0–46.0)
Lymphs Abs: 2 10*3/uL (ref 0.7–4.0)
MCHC: 34.2 g/dL (ref 30.0–36.0)
MCV: 92.1 fl (ref 78.0–100.0)
MONOS PCT: 9.2 % (ref 3.0–12.0)
Monocytes Absolute: 0.7 10*3/uL (ref 0.1–1.0)
NEUTROS PCT: 60 % (ref 43.0–77.0)
Neutro Abs: 4.7 10*3/uL (ref 1.4–7.7)
PLATELETS: 205 10*3/uL (ref 150.0–400.0)
RBC: 4.26 Mil/uL (ref 3.87–5.11)
RDW: 16.8 % — AB (ref 11.5–15.5)
WBC: 7.8 10*3/uL (ref 4.0–10.5)

## 2014-10-04 LAB — FERRITIN: Ferritin: 45.9 ng/mL (ref 10.0–291.0)

## 2014-10-10 ENCOUNTER — Other Ambulatory Visit: Payer: Self-pay

## 2014-10-10 DIAGNOSIS — D509 Iron deficiency anemia, unspecified: Secondary | ICD-10-CM

## 2014-11-09 ENCOUNTER — Telehealth: Payer: Self-pay | Admitting: Internal Medicine

## 2014-11-09 NOTE — Telephone Encounter (Signed)
Pt states her PCP does not want to refill the pts Relafen until Dr. [name] says its ok for her to continue taking it. Dr. [name] please advise if pt may continue to take Relafen.

## 2014-11-13 NOTE — Telephone Encounter (Signed)
No GI contraindication to Relafen

## 2014-11-13 NOTE — Telephone Encounter (Signed)
Spoke with pt and she is aware.

## 2014-11-13 NOTE — Telephone Encounter (Signed)
Left message for pt to call back  °

## 2014-12-10 ENCOUNTER — Telehealth: Payer: Self-pay | Admitting: Internal Medicine

## 2014-12-10 DIAGNOSIS — D509 Iron deficiency anemia, unspecified: Secondary | ICD-10-CM

## 2014-12-10 DIAGNOSIS — K222 Esophageal obstruction: Secondary | ICD-10-CM

## 2014-12-10 DIAGNOSIS — D122 Benign neoplasm of ascending colon: Secondary | ICD-10-CM

## 2014-12-10 MED ORDER — ESOMEPRAZOLE MAGNESIUM 40 MG PO CPDR: 40.0000 mg | DELAYED_RELEASE_CAPSULE | Freq: Every day | ORAL | Status: DC

## 2014-12-10 NOTE — Telephone Encounter (Signed)
Refilled Nexium 

## 2015-01-02 ENCOUNTER — Encounter: Payer: Self-pay | Admitting: Internal Medicine

## 2015-04-03 ENCOUNTER — Encounter (HOSPITAL_COMMUNITY): Payer: Self-pay | Admitting: Emergency Medicine

## 2015-04-03 ENCOUNTER — Emergency Department (HOSPITAL_COMMUNITY): Payer: Medicare Other

## 2015-04-03 ENCOUNTER — Inpatient Hospital Stay (HOSPITAL_COMMUNITY)
Admission: EM | Admit: 2015-04-03 | Discharge: 2015-04-05 | DRG: 494 | Disposition: A | Discharge status: Home Health | Payer: Medicare Other | Attending: Orthopedic Surgery | Admitting: Orthopedic Surgery

## 2015-04-03 ENCOUNTER — Other Ambulatory Visit: Payer: Self-pay | Admitting: Orthopedic Surgery

## 2015-04-03 DIAGNOSIS — I10 Essential (primary) hypertension: Secondary | ICD-10-CM | POA: Diagnosis present

## 2015-04-03 DIAGNOSIS — Z7982 Long term (current) use of aspirin: Secondary | ICD-10-CM

## 2015-04-03 DIAGNOSIS — Z87891 Personal history of nicotine dependence: Secondary | ICD-10-CM

## 2015-04-03 DIAGNOSIS — S82853A Displaced trimalleolar fracture of unspecified lower leg, initial encounter for closed fracture: Secondary | ICD-10-CM | POA: Diagnosis present

## 2015-04-03 DIAGNOSIS — W010XXA Fall on same level from slipping, tripping and stumbling without subsequent striking against object, initial encounter: Secondary | ICD-10-CM | POA: Diagnosis present

## 2015-04-03 DIAGNOSIS — E039 Hypothyroidism, unspecified: Secondary | ICD-10-CM | POA: Diagnosis present

## 2015-04-03 DIAGNOSIS — E785 Hyperlipidemia, unspecified: Secondary | ICD-10-CM | POA: Diagnosis present

## 2015-04-03 DIAGNOSIS — F411 Generalized anxiety disorder: Secondary | ICD-10-CM | POA: Diagnosis present

## 2015-04-03 DIAGNOSIS — K219 Gastro-esophageal reflux disease without esophagitis: Secondary | ICD-10-CM | POA: Diagnosis present

## 2015-04-03 DIAGNOSIS — S82852A Displaced trimalleolar fracture of left lower leg, initial encounter for closed fracture: Secondary | ICD-10-CM

## 2015-04-03 DIAGNOSIS — T1490XA Injury, unspecified, initial encounter: Secondary | ICD-10-CM

## 2015-04-03 DIAGNOSIS — M25572 Pain in left ankle and joints of left foot: Secondary | ICD-10-CM | POA: Diagnosis present

## 2015-04-03 DIAGNOSIS — Q7290 Unspecified reduction defect of unspecified lower limb: Secondary | ICD-10-CM

## 2015-04-03 LAB — BASIC METABOLIC PANEL
ANION GAP: 6 (ref 5–15)
Anion gap: 8 (ref 5–15)
BUN: 13 mg/dL (ref 6–20)
BUN: 13 mg/dL (ref 6–20)
CALCIUM: 9.7 mg/dL (ref 8.9–10.3)
CALCIUM: 8.7 mg/dL — AB (ref 8.9–10.3)
CHLORIDE: 107 mmol/L (ref 101–111)
CHLORIDE: 112 mmol/L — AB (ref 101–111)
CO2: 26 mmol/L (ref 22–32)
CO2: 22 mmol/L (ref 22–32)
CREATININE: 0.78 mg/dL (ref 0.44–1.00)
Creatinine, Ser: 0.65 mg/dL (ref 0.44–1.00)
GFR calc Af Amer: >60 mL/min (ref >60)
GFR calc non Af Amer: >60 mL/min (ref >60)
GFR calc non Af Amer: >60 mL/min (ref >60)
GLUCOSE: 146 mg/dL — AB (ref 65–99)
GLUCOSE: 100 mg/dL — AB (ref 65–99)
Potassium: 3.4 mmol/L — ABNORMAL LOW (ref 3.5–5.1)
Potassium: 4 mmol/L (ref 3.5–5.1)
Sodium: 141 mmol/L (ref 135–145)
Sodium: 140 mmol/L (ref 135–145)

## 2015-04-03 LAB — CBC
HEMATOCRIT: 33.8 % — AB (ref 36.0–46.0)
HEMOGLOBIN: 11.1 g/dL — AB (ref 12.0–15.0)
MCH: 32.1 pg (ref 26.0–34.0)
MCHC: 32.8 g/dL (ref 30.0–36.0)
MCV: 97.7 fL (ref 78.0–100.0)
Platelets: 206 10*3/uL (ref 150–400)
RBC: 3.46 MIL/uL — AB (ref 3.87–5.11)
RDW: 13.8 % (ref 11.5–15.5)
WBC: 10.3 10*3/uL (ref 4.0–10.5)

## 2015-04-03 LAB — CBC WITH DIFFERENTIAL/PLATELET
Basophils Absolute: 0.1 K/uL (ref 0.0–0.1)
Basophils Relative: 1 % (ref 0–1)
Eosinophils Absolute: 0.2 K/uL (ref 0.0–0.7)
Eosinophils Relative: 3 % (ref 0–5)
HCT: 35.4 % — ABNORMAL LOW (ref 36.0–46.0)
Hemoglobin: 11.9 g/dL — ABNORMAL LOW (ref 12.0–15.0)
Lymphocytes Relative: 22 % (ref 12–46)
Lymphs Abs: 1.7 K/uL (ref 0.7–4.0)
MCH: 32.2 pg (ref 26.0–34.0)
MCHC: 33.6 g/dL (ref 30.0–36.0)
MCV: 95.7 fL (ref 78.0–100.0)
Monocytes Absolute: 0.4 K/uL (ref 0.1–1.0)
Monocytes Relative: 5 % (ref 3–12)
Neutro Abs: 5.5 K/uL (ref 1.7–7.7)
Neutrophils Relative %: 69 % (ref 43–77)
Platelets: 202 K/uL (ref 150–400)
RBC: 3.7 MIL/uL — ABNORMAL LOW (ref 3.87–5.11)
RDW: 13.5 % (ref 11.5–15.5)
WBC: 7.8 K/uL (ref 4.0–10.5)

## 2015-04-03 LAB — PROTIME-INR
INR: 1.12 (ref 0.00–1.49)
PROTHROMBIN TIME: 14.6 s (ref 11.6–15.2)

## 2015-04-03 LAB — APTT: aPTT: 27 seconds (ref 24–37)

## 2015-04-03 MED ORDER — SODIUM CHLORIDE 0.9 % IV SOLN: INTRAVENOUS | Status: DC

## 2015-04-03 MED ORDER — DIAZEPAM 5 MG PO TABS
5.0000 mg | ORAL_TABLET | Freq: Four times a day (QID) | ORAL | Status: DC | PRN
  Administered 2015-04-03 – 2015-04-04 (×2): 5 mg via ORAL
  Filled 2015-04-03 (×2): qty 1

## 2015-04-03 MED ORDER — HYDROMORPHONE HCL 1 MG/ML IJ SOLN
2.0000 mg | INTRAMUSCULAR | Status: DC | PRN
  Administered 2015-04-03 – 2015-04-05 (×2): 2 mg via INTRAVENOUS
  Filled 2015-04-03 (×2): qty 2

## 2015-04-03 MED ORDER — ACETAMINOPHEN 500 MG PO TABS
1000.0000 mg | ORAL_TABLET | Freq: Four times a day (QID) | ORAL | Status: DC
Start: 2015-04-03 — End: 2015-04-05
  Administered 2015-04-03 – 2015-04-05 (×7): 1000 mg via ORAL
  Filled 2015-04-03 (×8): qty 2

## 2015-04-03 MED ORDER — DOCUSATE SODIUM 100 MG PO CAPS
100.0000 mg | ORAL_CAPSULE | Freq: Two times a day (BID) | ORAL | Status: DC
  Administered 2015-04-03 – 2015-04-05 (×4): 100 mg via ORAL
  Filled 2015-04-03 (×4): qty 1

## 2015-04-03 MED ORDER — HYDROMORPHONE HCL 2 MG PO TABS: 2.0000 mg | ORAL_TABLET | Freq: Three times a day (TID) | ORAL | Status: DC | PRN

## 2015-04-03 MED ORDER — HYDROMORPHONE HCL 1 MG/ML IJ SOLN
1.0000 mg | Freq: Once | INTRAMUSCULAR | Status: AC
  Administered 2015-04-03: 1 mg via INTRAVENOUS
  Filled 2015-04-03: qty 1

## 2015-04-03 MED ORDER — FLUTICASONE PROPIONATE 50 MCG/ACT NA SUSP
1.0000 | Freq: Every day | NASAL | Status: DC
  Administered 2015-04-03 – 2015-04-05 (×2): 1 via NASAL
  Filled 2015-04-03: qty 16

## 2015-04-03 MED ORDER — NAPROXEN 250 MG PO TABS
500.0000 mg | ORAL_TABLET | Freq: Two times a day (BID) | ORAL | Status: DC
  Administered 2015-04-03 – 2015-04-05 (×5): 500 mg via ORAL
  Filled 2015-04-03 (×5): qty 2

## 2015-04-03 MED ORDER — PANTOPRAZOLE SODIUM 40 MG PO TBEC
40.0000 mg | DELAYED_RELEASE_TABLET | Freq: Every day | ORAL | Status: DC
  Administered 2015-04-03 – 2015-04-05 (×2): 40 mg via ORAL
  Filled 2015-04-03 (×2): qty 1

## 2015-04-03 MED ORDER — KETAMINE HCL 10 MG/ML IJ SOLN
INTRAMUSCULAR | Status: AC | PRN
  Administered 2015-04-03: 50 mg via INTRAVENOUS

## 2015-04-03 MED ORDER — ONDANSETRON HCL 4 MG/2ML IJ SOLN
4.0000 mg | Freq: Four times a day (QID) | INTRAMUSCULAR | Status: DC | PRN
  Administered 2015-04-04: 4 mg via INTRAVENOUS

## 2015-04-03 MED ORDER — CHLORHEXIDINE GLUCONATE 4 % EX LIQD: 60.0000 mL | Freq: Once | CUTANEOUS | Status: DC

## 2015-04-03 MED ORDER — KETAMINE HCL 10 MG/ML IJ SOLN
2.0000 mg/kg | Freq: Once | INTRAMUSCULAR | Status: AC
  Administered 2015-04-03: 70 mg via INTRAVENOUS
  Filled 2015-04-03: qty 18.6

## 2015-04-03 MED ORDER — CEFAZOLIN SODIUM-DEXTROSE 2-3 GM-% IV SOLR: 2.0000 g | INTRAVENOUS | Status: DC

## 2015-04-03 MED ORDER — LEVOTHYROXINE SODIUM 88 MCG PO TABS
88.0000 ug | ORAL_TABLET | Freq: Every day | ORAL | Status: DC
  Administered 2015-04-03: 88 ug via ORAL
  Filled 2015-04-03 (×2): qty 1

## 2015-04-03 MED ORDER — CHLORHEXIDINE GLUCONATE 4 % EX LIQD
60.0000 mL | Freq: Once | CUTANEOUS | Status: AC
  Administered 2015-04-04: 4 via TOPICAL
  Filled 2015-04-03: qty 60

## 2015-04-03 MED ORDER — HYDROMORPHONE HCL 1 MG/ML IJ SOLN: 2.0000 mg | INTRAMUSCULAR | Status: DC | PRN

## 2015-04-03 MED ORDER — METHOCARBAMOL 500 MG PO TABS
500.0000 mg | ORAL_TABLET | Freq: Four times a day (QID) | ORAL | Status: DC | PRN
  Administered 2015-04-03: 500 mg via ORAL
  Filled 2015-04-03: qty 1

## 2015-04-03 MED ORDER — OXYCODONE HCL 5 MG PO TABS
5.0000 mg | ORAL_TABLET | ORAL | Status: DC | PRN
  Administered 2015-04-03: 10 mg via ORAL
  Filled 2015-04-03: qty 2

## 2015-04-03 MED ORDER — DIPHENHYDRAMINE HCL 50 MG/ML IJ SOLN: 12.5000 mg | Freq: Four times a day (QID) | INTRAMUSCULAR | Status: DC | PRN

## 2015-04-03 MED ORDER — ATORVASTATIN CALCIUM 40 MG PO TABS
40.0000 mg | ORAL_TABLET | Freq: Every day | ORAL | Status: DC
  Administered 2015-04-03 – 2015-04-04 (×2): 40 mg via ORAL
  Filled 2015-04-03 (×2): qty 1

## 2015-04-03 MED ORDER — HYDROCHLOROTHIAZIDE 25 MG PO TABS
25.0000 mg | ORAL_TABLET | Freq: Every day | ORAL | Status: DC
  Administered 2015-04-03: 25 mg via ORAL
  Filled 2015-04-03: qty 1

## 2015-04-03 MED ORDER — SENNA 8.6 MG PO TABS
1.0000 | ORAL_TABLET | Freq: Two times a day (BID) | ORAL | Status: DC
Start: 2015-04-03 — End: 2015-04-05
  Administered 2015-04-03 – 2015-04-05 (×4): 8.6 mg via ORAL
  Filled 2015-04-03 (×4): qty 1

## 2015-04-03 MED ORDER — ONDANSETRON 4 MG PO TBDP
4.0000 mg | ORAL_TABLET | Freq: Four times a day (QID) | ORAL | Status: DC | PRN
  Filled 2015-04-03: qty 1

## 2015-04-03 MED ORDER — MORPHINE SULFATE (PF) 2 MG/ML IV SOLN
2.0000 mg | INTRAVENOUS | Status: DC | PRN
  Administered 2015-04-03: 2 mg via INTRAVENOUS
  Filled 2015-04-03: qty 1

## 2015-04-03 MED ORDER — HYDROMORPHONE HCL 2 MG PO TABS
2.0000 mg | ORAL_TABLET | ORAL | Status: DC | PRN
  Administered 2015-04-03 – 2015-04-04 (×4): 2 mg via ORAL
  Administered 2015-04-05: 4 mg via ORAL
  Administered 2015-04-05: 2 mg via ORAL
  Filled 2015-04-03 (×3): qty 1
  Filled 2015-04-03: qty 2
  Filled 2015-04-03 (×2): qty 1

## 2015-04-03 MED ORDER — DIPHENHYDRAMINE HCL 12.5 MG/5ML PO ELIX
12.5000 mg | ORAL_SOLUTION | Freq: Four times a day (QID) | ORAL | Status: DC | PRN
  Administered 2015-04-03: 12.5 mg via ORAL
  Filled 2015-04-03: qty 10

## 2015-04-03 MED ORDER — SERTRALINE HCL 100 MG PO TABS
100.0000 mg | ORAL_TABLET | Freq: Every day | ORAL | Status: DC
  Administered 2015-04-03 – 2015-04-05 (×2): 100 mg via ORAL
  Filled 2015-04-03 (×2): qty 1

## 2015-04-03 MED ORDER — KETAMINE HCL 10 MG/ML IJ SOLN
2.0000 mg/kg | Freq: Once | INTRAMUSCULAR | Status: AC
  Administered 2015-04-03: 50 mg via INTRAVENOUS
  Filled 2015-04-03: qty 18.6

## 2015-04-03 MED ORDER — KETAMINE HCL 10 MG/ML IJ SOLN
INTRAMUSCULAR | Status: AC | PRN
  Administered 2015-04-03: 50 mg via INTRAVENOUS
  Administered 2015-04-03: 70 mg via INTRAVENOUS

## 2015-04-03 MED ORDER — SODIUM CHLORIDE 0.9 % IV BOLUS (SEPSIS)
1000.0000 mL | Freq: Once | INTRAVENOUS | Status: AC
  Administered 2015-04-03: 1000 mL via INTRAVENOUS

## 2015-04-03 MED ORDER — CEFAZOLIN SODIUM-DEXTROSE 2-3 GM-% IV SOLR
2.0000 g | INTRAVENOUS | Status: AC
  Administered 2015-04-04: 2 g via INTRAVENOUS
  Filled 2015-04-03: qty 50

## 2015-04-03 MED ORDER — MORPHINE SULFATE (PF) 2 MG/ML IV SOLN: 2.0000 mg | INTRAVENOUS | Status: DC | PRN

## 2015-04-03 NOTE — Sedation Documentation (Signed)
Respiratory using bag valve mask

## 2015-04-03 NOTE — Sedation Documentation (Signed)
Dr. Claudine Mouton, Abelino Derrick RRT, Tori P RN, and Kendrick Ranch RN at bedside

## 2015-04-03 NOTE — ED Notes (Signed)
Attempted report 

## 2015-04-03 NOTE — Sedation Documentation (Signed)
Ortho at bedside.

## 2015-04-03 NOTE — ED Notes (Addendum)
Dr. Doran Durand at bedside. POC changed; Pt be admitted now and surgery sched tentatively for tomm.

## 2015-04-03 NOTE — ED Notes (Signed)
Dr Doran Durand in to see pt-- will admit. Pt has good cap refill to toes, left leg elevated onto towels, sister at bedside.

## 2015-04-03 NOTE — ED Notes (Signed)
Ortho at bedside.

## 2015-04-03 NOTE — ED Provider Notes (Addendum)
CSN: [id number]     Arrival date & time 04/03/15  0222 History  This chart was scribed for Everlene Balls, MD by Eustaquio Maize, ED Scribe. This patient was seen in room A09C/A09C and the patient's care was started at 2:30 AM.  Chief Complaint  Patient presents with  . Fall   The history is provided by the patient. No language interpreter was used.     HPI Comments: [name] is a 69 y.o. female who presents to the Emergency Department complaining of sudden onset, left ankle pain s/p ground level fall that occurred earlier tonight. Pt was getting up to use the rest room when she tripped and fell. Denies head injury or LOC. Denies any other associated symptoms. 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   Past Medical History  Diagnosis Date  . Benign neoplasm of colon   . Depressive disorder, not elsewhere classified   . Anxiety state, unspecified   . Other and unspecified hyperlipidemia   . Esophageal reflux   . Obesity   . Hypothyroidism   . Hemorrhoids   . Colon polyps     hyperplastic  . Family history of colon cancer mother  . Lymphocytic colitis   . Contact lens/glasses fitting     wears glassses or contacts  . Complications affecting other specified body systems, hypertension     No cardiologist   . Seizures     x 1  . Brain tumor    Past Surgical History  Procedure Laterality Date  . Craniotomy    . Back surgery    . Abdominal hysterectomy    . Appendectomy    . Carpal tunnel release      right  . Brain surgery  1999    benign tumor  . Hammertoe reconstruction with weil osteotomy  07/07/2012    Procedure: HAMMERTOE RECONSTRUCTION WITH WEIL OSTEOTOMY;  Surgeon: Wylene Simmer, MD;  Location: [place];  Service: Orthopedics;  Laterality: Left;  Left Second through [place]; Left Fifth Toe Flexor Tendon Release, and Left Second through Fourth Weil Osteotomy   Family History   Problem Relation Age of Onset  . Breast cancer Mother   . Colon cancer Mother   . Lung cancer Maternal Uncle   . Bone cancer Maternal Grandmother   . Heart attack Father    Social History  Substance Use Topics  . Smoking status: Former Smoker -- 0.50 packs/day for 20 years    Quit date: 09/02/2010  . Smokeless tobacco: Never Used  . Alcohol Use: 3.5 oz/week    7 drink(s) per week     Comment: 1 martini daily on most days   OB History    No data available     Review of Systems  10 Systems reviewed and all are negative for acute change except as noted in the HPI.  Allergies  Codeine; Hydrocod polst-cpm polst er; Hydrocodone-acetaminophen; Sulfa antibiotics; Hydrocodone; and Sulfonamide derivatives  Home Medications   Prior to Admission medications   Medication Sig Start Date End Date Taking? Authorizing Provider  Ascorbic Acid (VITAMIN C) 1000 MG tablet Take 1,000 mg by mouth daily.      Historical Provider, MD  aspirin 81 MG tablet Take 81 mg by mouth 2 (two) times daily.     Historical Provider, MD  atorvastatin (LIPITOR) 40 MG tablet Take 1 tablets by mouth once daily.    Historical  Provider, MD  Calcium Carbonate-Vitamin D 600-400 MG-UNIT per tablet Take 1 tablet by mouth 2 (two) times daily.      Historical Provider, MD  Cetirizine HCl (ZYRTEC ALLERGY PO) Take 1 tablet by mouth daily. Pt taking Allertec    Historical Provider, MD  diphenoxylate-atropine (LOMOTIL) 2.5-0.025 MG per tablet Take 1 tablet by mouth 2 (two) times daily. 04/25/14   Irene Shipper, MD  esomeprazole (NEXIUM) 40 MG capsule Take 1 capsule (40 mg total) by mouth daily at 12 noon. 12/10/14   Irene Shipper, MD  fluticasone (FLONASE) 50 MCG/ACT nasal spray Place 2 sprays into the nose daily.    Historical Provider, MD  hydrochlorothiazide 25 MG tablet Take 25 mg by mouth daily.     Historical Provider, MD  hydrocortisone (ANUSOL-HC) 25 MG suppository Place 1 suppository (25 mg total) rectally at bedtime.  04/25/14   Irene Shipper, MD  hydrocortisone acetate 0.5 % cream Apply 1 application topically daily as needed (for hemorrhoids). 04/25/14   Irene Shipper, MD  levothyroxine (SYNTHROID, LEVOTHROID) 88 MCG tablet Take 88 mcg by mouth daily.    Historical Provider, MD  Melatonin 5 MG CAPS Take 10 mg by mouth daily.     Historical Provider, MD  nabumetone (RELAFEN) 500 MG tablet Take 500 mg by mouth 2 (two) times daily.     Historical Provider, MD  Omega-3 Fatty Acids (FISH OIL) 1000 MG CAPS Take 1 capsule by mouth 2 (two) times daily.     Historical Provider, MD  sertraline (ZOLOFT) 100 MG tablet Take 100 mg by mouth daily.    Historical Provider, MD   Triage Vitals: BP 123/62 mmHg  Pulse 80  Temp(Src) 98.3 F (36.8 C) (Oral)  SpO2 97%   Physical Exam  Constitutional: She is oriented to person, place, and time. She appears well-developed and well-nourished. No distress.  HENT:  Head: Normocephalic and atraumatic.  Nose: Nose normal.  Mouth/Throat: Oropharynx is clear and moist. No oropharyngeal exudate.  Eyes: Conjunctivae and EOM are normal. Pupils are equal, round, and reactive to light. No scleral icterus.  Neck: Normal range of motion. Neck supple. No JVD present. No tracheal deviation present. No thyromegaly present.  Cardiovascular: Normal rate, regular rhythm and normal heart sounds.  Exam reveals no gallop and no friction rub.   No murmur heard. Pulmonary/Chest: Effort normal and breath sounds normal. No respiratory distress. She has no wheezes. She exhibits no tenderness.  Abdominal: Soft. Bowel sounds are normal. She exhibits no distension and no mass. There is no tenderness. There is no rebound and no guarding.  Musculoskeletal: Normal range of motion. She exhibits no edema or tenderness.  Left ankle obvious deformity with external rotation  Palpable pulses present Pt cannot wiggle toes s/p hammertoe reconstruction She has sensation distally Ankle is in immobilizing brace   Lymphadenopathy:    She has no cervical adenopathy.  Neurological: She is alert and oriented to person, place, and time. No cranial nerve deficit. She exhibits normal muscle tone.  Skin: Skin is warm and dry. No rash noted. No erythema. No pallor.  Nursing note and vitals reviewed.   ED Course  Procedures (including critical care time)  DIAGNOSTIC STUDIES: Oxygen Saturation is 97% on RA, normal by my interpretation.    COORDINATION OF CARE: 2:33 AM-Discussed treatment plan which includes DG L Ankle, DG L Tib/Fib, CBC, BMP with pt at bedside and pt agreed to plan.   Labs Review Labs Reviewed  CBC WITH DIFFERENTIAL/PLATELET -  Abnormal; Notable for the following:    RBC 3.70 (*)    Hemoglobin 11.9 (*)    HCT 35.4 (*)    All other components within normal limits  BASIC METABOLIC PANEL - Abnormal; Notable for the following:    Potassium 3.4 (*)    Glucose, Bld 146 (*)    All other components within normal limits    Imaging Review Dg Tibia/fibula Left  04/03/2015   CLINICAL DATA:  Left ankle pain after a fall.  EXAM: LEFT TIBIA AND FIBULA - 2 VIEW  COMPARISON:  Left ankle 04/03/2015  FINDINGS: Trimalleolar fracture dislocation of the left ankle. See description of left ankle views 04/03/2015. Proximal tibia and fibula are otherwise intact. No additional fractures identified.  IMPRESSION: Trimalleolar fracture and dislocation of the left ankle. Proximal tibia and fibula appear intact.   Electronically Signed   By: Lucienne Capers M.D.   On: 04/03/2015 03:19   Dg Ankle Complete Left  04/03/2015   CLINICAL DATA:  Left ankle pain after a fall.  Obvious deformity.  EXAM: LEFT ANKLE COMPLETE - 3+ VIEW  COMPARISON:  None.  FINDINGS: Transverse comminuted fracture of the medial malleolus. Oblique fracture of the lateral malleolus. Lateral displacement of the fracture fragments as well as the talus with respect to the tibia. Overriding of the fibular fracture fragments. Probable displaced fracture  off of the posterior malleolus. Anterior dislocation of the talus with respect to the tibia.  IMPRESSION: Trimalleolar fracture and dislocation of the left ankle.   Electronically Signed   By: Lucienne Capers M.D.   On: 04/03/2015 03:18   I have personally reviewed and evaluated these images and lab results as part of my medical decision-making.   EKG Interpretation None      MDM   Final diagnoses:  None   Patient presents to the emergency department after a mechanical fall. X-ray reveals a trimalleolar fracture of the left side. Patient will need reduction and splinting. I paged orthopedic surgery for further recommendations.  I spoke with Dr. Percell Miller who recs for ED reduction and follow up with Dr. Doran Durand today in clinic.  Patient was successfully reduced after 2 attempts.  Splint applied.  Family is worried about the patient being able to make the clinic appointment and the mechanics of getting her home and to clinic.  I offered for them to stay and be evaluated by orthopaedic surgery in the morning in the ED.  Ultimately, I informed them that the patient will likely be discharged home today.  They demonstrated understanding.    Procedural sedation - 5:00AM Performed by: Everlene Balls Consent: Verbal consent obtained. Risks and benefits: risks, benefits and alternatives were discussed Required items: required blood products, implants, devices, and special equipment available Patient identity confirmed: arm band and provided demographic data Time out: Immediately prior to procedure a "time out" was called to verify the correct patient, procedure, equipment, support staff and site/side marked as required.  Sedation type: moderate (conscious) sedation NPO time confirmed and considedered  Sedatives: [place]   Physician Time at Bedside: 68min  Vitals: Vital signs were monitored during sedation. Cardiac Monitor, pulse oximeter Patient tolerance: Patient tolerated the procedure well with  no immediate complications. Comments: Pt with uneventful recovered. Returned to pre-procedural sedation baseline   Reduction of dislocation Date/Time: 5:55 AM Performed by: Everlene Balls Authorized byEverlene Balls Consent: Verbal consent obtained. Risks and benefits: risks, benefits and alternatives were discussed Consent given by: patient Required items: required blood products, implants, devices, and special  equipment available Time out: Immediately prior to procedure a "time out" was called to verify the correct patient, procedure, equipment, support staff and site/side marked as required.  Patient sedated: Ketamine  Vitals: Vital signs were monitored during sedation. Patient tolerance: Patient tolerated the procedure well with no immediate complications. Joint: L trimalleolar fracture Reduction technique: traction-counter traction  Reduction of dislocation Date/Time: 5:56 AM Performed by: Everlene Balls Authorized byEverlene Balls Consent: Verbal consent obtained. Risks and benefits: risks, benefits and alternatives were discussed Consent given by: patient Required items: required blood products, implants, devices, and special equipment available Time out: Immediately prior to procedure a "time out" was called to verify the correct patient, procedure, equipment, support staff and site/side marked as required.  Patient sedated: Ketamine  Vitals: Vital signs were monitored during sedation. Patient tolerance: Patient tolerated the procedure well with no immediate complications. Joint: L trimalleolar fracture Reduction technique: traction-counter traction      I personally performed the services described in this documentation, which was scribed in my presence. The recorded information has been reviewed and is accurate.   Everlene Balls, MD 04/03/15 1216  Everlene Balls, MD 04/30/15 1730

## 2015-04-03 NOTE — Progress Notes (Signed)
Utilization review completed.  

## 2015-04-03 NOTE — Consult Note (Signed)
ORTHOPAEDIC CONSULTATION  REQUESTING PHYSICIAN: Wylene Simmer, MD  Chief Complaint: left ankle fracture  HPI: [name] is a 69 y.o. female who complains of a fall onto her left foot with pain in her ankle. She denies any other complaints. Pain is well-controlled. She denies numbness or tingling.  Past Medical History  Diagnosis Date  . Benign neoplasm of colon   . Depressive disorder, not elsewhere classified   . Anxiety state, unspecified   . Other and unspecified hyperlipidemia   . Esophageal reflux   . Obesity   . Hypothyroidism   . Hemorrhoids   . Colon polyps     hyperplastic  . Family history of colon cancer mother  . Lymphocytic colitis   . Contact lens/glasses fitting     wears glassses or contacts  . Complications affecting other specified body systems, hypertension     No cardiologist   . Seizures     x 1  . Brain tumor    Past Surgical History  Procedure Laterality Date  . Craniotomy    . Back surgery    . Abdominal hysterectomy    . Appendectomy    . Carpal tunnel release      right  . Brain surgery  1999    benign tumor  . Hammertoe reconstruction with weil osteotomy  07/07/2012    Procedure: HAMMERTOE RECONSTRUCTION WITH WEIL OSTEOTOMY;  Surgeon: Wylene Simmer, MD;  Location: [place];  Service: Orthopedics;  Laterality: Left;  Left Second through [place]; Left Fifth Toe Flexor Tendon Release, and Left Second through Fourth Weil Osteotomy   Social History   Social History  . Marital Status: Married    Spouse Name: N/A  . Number of Children: 3  . Years of Education: N/A   Occupational History  . retired   .     Social History Main Topics  . Smoking status: Former Smoker -- 0.50 packs/day for 20 years    Quit date: 09/02/2010  . Smokeless tobacco: Never Used  . Alcohol Use: 3.5 oz/week    7 drink(s) per week     Comment: 1 martini daily on most days  . Drug Use: No  . Sexual Activity: Not Asked     Other Topics Concern  . None   Social History Narrative   Family History  Problem Relation Age of Onset  . Breast cancer Mother   . Colon cancer Mother   . Lung cancer Maternal Uncle   . Bone cancer Maternal Grandmother   . Heart attack Father    Allergies  Allergen Reactions  . Codeine Other (See Comments)    nightmares  . Hydrocod Polst-Cpm Polst Er Itching  . Hydrocodone-Acetaminophen Itching    Has to take benadryl with to tolerate  . Sulfa Antibiotics   . Hydrocodone Itching    Has to take benadryl to tolerate med  . Sulfonamide Derivatives Rash   Prior to Admission medications   Medication Sig Start Date End Date Taking? Authorizing Provider  Ascorbic Acid (VITAMIN C) 1000 MG tablet Take 1,000 mg by mouth daily.     Yes Historical Provider, MD  aspirin 81 MG tablet Take 81 mg by mouth 2 (two) times daily.    Yes Historical Provider, MD  atorvastatin (LIPITOR) 40 MG tablet Take 1 tablets by mouth once daily.   Yes Historical Provider, MD  BIOTIN PO Take 1 tablet by mouth daily.   Yes Historical Provider, MD  Calcium Carbonate-Vitamin D 600-400 MG-UNIT per tablet Take 1 tablet by mouth 2 (two) times daily.     Yes Historical Provider, MD  cetirizine (ZYRTEC) 10 MG tablet Take 10 mg by mouth daily.   Yes Historical Provider, MD  diphenoxylate-atropine (LOMOTIL) 2.5-0.025 MG per tablet Take 1 tablet by mouth 2 (two) times daily. Patient taking differently: Take 1 tablet by mouth 2 (two) times daily as needed for diarrhea or loose stools.  04/25/14  Yes Irene Shipper, MD  Doxylamine Succinate, Sleep, (SLEEP AID PO) Take 1 tablet by mouth at bedtime.   Yes Historical Provider, MD  esomeprazole (NEXIUM) 40 MG capsule Take 1 capsule (40 mg total) by mouth daily at 12 noon. 12/10/14  Yes Irene Shipper, MD  fluticasone [facility]) 50 MCG/ACT nasal spray Place 2 sprays into the nose daily.   Yes Historical Provider, MD  hydrochlorothiazide 25 MG tablet Take 25 mg by mouth daily.    Yes  Historical Provider, MD  levothyroxine (SYNTHROID, LEVOTHROID) 88 MCG tablet Take 88 mcg by mouth daily.   Yes Historical Provider, MD  nabumetone (RELAFEN) 500 MG tablet Take 500 mg by mouth 2 (two) times daily.    Yes Historical Provider, MD  Omega-3 Fatty Acids (FISH OIL) 1000 MG CAPS Take 1 capsule by mouth 2 (two) times daily.    Yes Historical Provider, MD  sertraline (ZOLOFT) 100 MG tablet Take 100 mg by mouth daily.   Yes Historical Provider, MD  HYDROmorphone (DILAUDID) 2 MG tablet Take 1 tablet (2 mg total) by mouth 3 (three) times daily as needed for severe pain. 04/03/15   Everlene Balls, MD   Dg Tibia/fibula Left  04/03/2015   CLINICAL DATA:  Left ankle pain after a fall.  EXAM: LEFT TIBIA AND FIBULA - 2 VIEW  COMPARISON:  Left ankle 04/03/2015  FINDINGS: Trimalleolar fracture dislocation of the left ankle. See description of left ankle views 04/03/2015. Proximal tibia and fibula are otherwise intact. No additional fractures identified.  IMPRESSION: Trimalleolar fracture and dislocation of the left ankle. Proximal tibia and fibula appear intact.   Electronically Signed   By: Lucienne Capers M.D.   On: 04/03/2015 03:19   Dg Ankle Complete Left  04/03/2015   CLINICAL DATA:  Left ankle pain after a fall.  Obvious deformity.  EXAM: LEFT ANKLE COMPLETE - 3+ VIEW  COMPARISON:  None.  FINDINGS: Transverse comminuted fracture of the medial malleolus. Oblique fracture of the lateral malleolus. Lateral displacement of the fracture fragments as well as the talus with respect to the tibia. Overriding of the fibular fracture fragments. Probable displaced fracture off of the posterior malleolus. Anterior dislocation of the talus with respect to the tibia.  IMPRESSION: Trimalleolar fracture and dislocation of the left ankle.   Electronically Signed   By: Lucienne Capers M.D.   On: 04/03/2015 03:18   Dg Ankle Left Port  04/03/2015   CLINICAL DATA:  Postreduction left ankle  EXAM: PORTABLE LEFT ANKLE - 2 VIEW   COMPARISON:  04/03/2015  FINDINGS: Overlying cast material obscures bone detail. Single AP view only obtained. There is improved alignment of the tibiotalar joint and lateral malleolar fracture since previous study. Mild residual lateral displacement of the lateral and medial malleolar fragments.  IMPRESSION: Improved alignment of tibiotalar joint and malleolar fractures since previous study.   Electronically Signed   By: Lucienne Capers M.D.   On: 04/03/2015 05:42   Dg Ankle Left Port  04/03/2015   CLINICAL DATA:  Postreduction  left ankle  EXAM: PORTABLE LEFT ANKLE - 2 VIEW  COMPARISON:  04/03/2015  FINDINGS: Interval placement of cast material which obscures some bone detail. Trimalleolar fractures of the left ankle. Improved alignment and position of fracture fragments although there is still significant lateral dislocation and angulation of the talus and medial on lateral malleolar fracture fragments with respect to the distal tibia. Mild displacement of the posterior malleolar fragment.  IMPRESSION: Improved position of try malleolar fractures of the left ankle but with residual lateral displacement of distal fracture fragments and of the talus with respect to the tibia.   Electronically Signed   By: Lucienne Capers M.D.   On: 04/03/2015 04:38    Positive ROS: All other systems have been reviewed and were otherwise negative with the exception of those mentioned in the HPI and as above.  Labs cbc  Recent Labs  04/03/15 0315 04/03/15 1230  WBC 7.8 10.3  HGB 11.9* 11.1*  HCT 35.4* 33.8*  PLT 202 206    Labs inflam No results for input(s): CRP in the last 72 hours.  Invalid input(s): ESR  Labs coag  Recent Labs  04/03/15 1230  INR 1.12     Recent Labs  04/03/15 0315 04/03/15 1230  NA 141 140  K 3.4* 4.0  CL 107 112*  CO2 26 22  GLUCOSE 146* 100*  BUN 13 13  CREATININE 0.78 0.65  CALCIUM 9.7 8.7*    Physical Exam: Filed Vitals:   04/04/15 1415  BP: 143/63  Pulse:  85  Temp: 97.5 F (36.4 C)  Resp: 16   General: Alert, no acute distress Cardiovascular: No pedal edema Respiratory: No cyanosis, no use of accessory musculature GI: No organomegaly, abdomen is soft and non-tender Skin: No lesions in the area of chief complaint other than those listed below in MSK exam.  Neurologic: Sensation intact distally Psychiatric: Patient is competent for consent with normal mood and affect Lymphatic: No axillary or cervical lymphadenopathy  MUSCULOSKELETAL:  LLE: Compartments are soft splint is intact she is able to wiggle her toes. Other extremities are atraumatic with painless ROM and NVI.  Assessment: Left trimal ankle fracture  Plan: I discussed treatment of this case with the patient I would recommend surgery she has been treated by Dr. Doran Durand in the past as her has her husband I will recheck to him and ask him to evaluate her as per their request. In the meantime I recommend that she continue to have it splinted elevate as much as possible nonweightbearing and we'll plan for future surgery.   Renette Butters, MD Cell [phone]   04/04/2015 2:42 PM

## 2015-04-03 NOTE — ED Notes (Signed)
Dr. Oni at bedside. 

## 2015-04-03 NOTE — ED Notes (Signed)
Pt woke up to use the restroom early this morning an tripped and fell. Pt with obvious deformity to left foot/ankle. Pt unable to move toes due to previous injury, but has good pulses and sensation to left foot.

## 2015-04-03 NOTE — H&P (Signed)
[name] is an 69 y.o. female.   Chief Complaint: L ankle pain HPI: This patient was seen in the ED by Dr. Doran Durand on 04/03/15.  Patient is a 69 year old female with the Gary listed below that presented to the [facility] ED on 04/03/15 after sustaining a fall at home last night while getting up to go to the restroom.  She noted severe pain and obvious deformity to her left ankle, was unable to WB, called 911 and was transported to the ED via EMS.  X-rays in the ED demonstrate a left trimalleolar fracture and ankle dislocation.  Dr. Fredonia Highland was originally consulted for evaluation, and he successfully reduced the ankle in the ED.  He then consulted Dr. Doran Durand as he is familiar with the patient and her medical history.  Dr. Doran Durand reviewed the results of the x-rays of the patient and stressed the seriousness of her injury with her and her family.  She is a long time patient of Dr. Nona Dell and underwent surgery in December of 2013 on her left foot for hammertoe corrections.  Past Medical History  Diagnosis Date  . Benign neoplasm of colon   . Depressive disorder, not elsewhere classified   . Anxiety state, unspecified   . Other and unspecified hyperlipidemia   . Esophageal reflux   . Obesity   . Hypothyroidism   . Hemorrhoids   . Colon polyps     hyperplastic  . Family history of colon cancer mother  . Lymphocytic colitis   . Contact lens/glasses fitting     wears glassses or contacts  . Complications affecting other specified body systems, hypertension     No cardiologist   . Seizures     x 1  . Brain tumor     Past Surgical History  Procedure Laterality Date  . Craniotomy    . Back surgery    . Abdominal hysterectomy    . Appendectomy    . Carpal tunnel release      right  . Brain surgery  1999    benign tumor  . Hammertoe reconstruction with weil osteotomy  07/07/2012    Procedure: HAMMERTOE RECONSTRUCTION WITH WEIL OSTEOTOMY;  Surgeon: Wylene Simmer, MD;  Location: [facility];  Service: Orthopedics;  Laterality: Left;  Left Second through [place]; Left Fifth Toe Flexor Tendon Release, and Left Second through Fourth Weil Osteotomy    Family History  Problem Relation Age of Onset  . Breast cancer Mother   . Colon cancer Mother   . Lung cancer Maternal Uncle   . Bone cancer Maternal Grandmother   . Heart attack Father    Social History:  reports that she quit smoking about 4 years ago. She has never used smokeless tobacco. She reports that she drinks about 3.5 oz of alcohol per week. She reports that she does not use illicit drugs.  Allergies:  Allergies  Allergen Reactions  . Codeine Other (See Comments)    nightmares  . Hydrocod Polst-Cpm Polst Er Itching  . Hydrocodone-Acetaminophen Itching    Has to take benadryl with to tolerate  . Sulfa Antibiotics   . Hydrocodone Itching    Has to take benadryl to tolerate med  . Sulfonamide Derivatives Rash     (Not in a hospital admission)  Results for orders placed or performed during the hospital encounter of 04/03/15 (from the past 48 hour(s))  CBC with Differential/Platelet     Status: Abnormal  Collection Time: 04/03/15  3:15 AM  Result Value Ref Range   WBC 7.8 4.0 - 10.5 K/uL   RBC 3.70 (L) 3.87 - 5.11 MIL/uL   Hemoglobin 11.9 (L) 12.0 - 15.0 g/dL   HCT 35.4 (L) 36.0 - 46.0 %   MCV 95.7 78.0 - 100.0 fL   MCH 32.2 26.0 - 34.0 pg   MCHC 33.6 30.0 - 36.0 g/dL   RDW 13.5 11.5 - 15.5 %   Platelets 202 150 - 400 K/uL   Neutrophils Relative % 69 43 - 77 %   Neutro Abs 5.5 1.7 - 7.7 K/uL   Lymphocytes Relative 22 12 - 46 %   Lymphs Abs 1.7 0.7 - 4.0 K/uL   Monocytes Relative 5 3 - 12 %   Monocytes Absolute 0.4 0.1 - 1.0 K/uL   Eosinophils Relative 3 0 - 5 %   Eosinophils Absolute 0.2 0.0 - 0.7 K/uL   Basophils Relative 1 0 - 1 %   Basophils Absolute 0.1 0.0 - 0.1 K/uL  Basic metabolic panel     Status: Abnormal   Collection Time: 04/03/15  3:15 AM  Result  Value Ref Range   Sodium 141 135 - 145 mmol/L   Potassium 3.4 (L) 3.5 - 5.1 mmol/L   Chloride 107 101 - 111 mmol/L   CO2 26 22 - 32 mmol/L   Glucose, Bld 146 (H) 65 - 99 mg/dL   BUN 13 6 - 20 mg/dL   Creatinine, Ser 0.78 0.44 - 1.00 mg/dL   Calcium 9.7 8.9 - 10.3 mg/dL   GFR calc non Af Amer >60 >60 mL/min   GFR calc Af Amer >60 >60 mL/min    Comment: (NOTE) The eGFR has been calculated using the CKD EPI equation. This calculation has not been validated in all clinical situations. eGFR's persistently <60 mL/min signify possible Chronic Kidney Disease.    Anion gap 8 5 - 15   Dg Tibia/fibula Left  04/03/2015   CLINICAL DATA:  Left ankle pain after a fall.  EXAM: LEFT TIBIA AND FIBULA - 2 VIEW  COMPARISON:  Left ankle 04/03/2015  FINDINGS: Trimalleolar fracture dislocation of the left ankle. See description of left ankle views 04/03/2015. Proximal tibia and fibula are otherwise intact. No additional fractures identified.  IMPRESSION: Trimalleolar fracture and dislocation of the left ankle. Proximal tibia and fibula appear intact.   Electronically Signed   By: Lucienne Capers M.D.   On: 04/03/2015 03:19   Dg Ankle Complete Left  04/03/2015   CLINICAL DATA:  Left ankle pain after a fall.  Obvious deformity.  EXAM: LEFT ANKLE COMPLETE - 3+ VIEW  COMPARISON:  None.  FINDINGS: Transverse comminuted fracture of the medial malleolus. Oblique fracture of the lateral malleolus. Lateral displacement of the fracture fragments as well as the talus with respect to the tibia. Overriding of the fibular fracture fragments. Probable displaced fracture off of the posterior malleolus. Anterior dislocation of the talus with respect to the tibia.  IMPRESSION: Trimalleolar fracture and dislocation of the left ankle.   Electronically Signed   By: Lucienne Capers M.D.   On: 04/03/2015 03:18   Dg Ankle Left Port  04/03/2015   CLINICAL DATA:  Postreduction left ankle  EXAM: PORTABLE LEFT ANKLE - 2 VIEW  COMPARISON:   04/03/2015  FINDINGS: Overlying cast material obscures bone detail. Single AP view only obtained. There is improved alignment of the tibiotalar joint and lateral malleolar fracture since previous study. Mild residual lateral displacement of  the lateral and medial malleolar fragments.  IMPRESSION: Improved alignment of tibiotalar joint and malleolar fractures since previous study.   Electronically Signed   By: Lucienne Capers M.D.   On: 04/03/2015 05:42   Dg Ankle Left Port  04/03/2015   CLINICAL DATA:  Postreduction left ankle  EXAM: PORTABLE LEFT ANKLE - 2 VIEW  COMPARISON:  04/03/2015  FINDINGS: Interval placement of cast material which obscures some bone detail. Trimalleolar fractures of the left ankle. Improved alignment and position of fracture fragments although there is still significant lateral dislocation and angulation of the talus and medial on lateral malleolar fracture fragments with respect to the distal tibia. Mild displacement of the posterior malleolar fragment.  IMPRESSION: Improved position of try malleolar fractures of the left ankle but with residual lateral displacement of distal fracture fragments and of the talus with respect to the tibia.   Electronically Signed   By: Lucienne Capers M.D.   On: 04/03/2015 04:38    ROS Gen: Denies fever, chills, weight change, fatigue, night sweats MSK: Reports moderate to severe pain in LLE. Neuro: Denies headache, numbness, weakness, slurred speech, loss of memory or consciousness Derm: Denies rash, dry skin, scaling or peeling skin change HEENT: Denies blurred vision, double vision, neck stiffness, dysphagia PULM: Denies shortness of breath, cough, sputum production, hemoptysis, wheezing CV: Denies chest pain, edema, orthopnea, palpitations GI: Denies abdominal pain, nausea, vomiting, diarrhea, hematochezia, melena, constipation  Endocrine: Denies hot or cold intolerance, polyuria, polyphagia or appetite change Heme: Denies easy bruising,  bleeding, bleeding gums   There were no vitals taken for this visit. Physical Exam  Constitutional: She is oriented to person, place, and time. She appears well-developed and well-nourished.  HENT:  Head: Normocephalic and atraumatic.  Eyes: EOM are normal. Pupils are equal, round, and reactive to light. Right eye exhibits no discharge. Left eye exhibits no discharge. No scleral icterus.  Neck: Normal range of motion.  Cardiovascular:  Popliteal pulse 2+.  + Cap refill  Respiratory: Effort normal. No stridor. No respiratory distress.  GI: There is no tenderness.  Musculoskeletal: She exhibits edema and tenderness.  Patient in LLE posterior splint.    Neurological: She is alert and oriented to person, place, and time.  Skin: Skin is warm and dry. No erythema.  Psychiatric: She has a normal mood and affect.     Assessment/Plan L ankle trimalleolar fracture and reduced ankle dislocation  Dr. Doran Durand took time to discuss that the only way for this injury to heal normally is via surgical intervention, specifically ORIF L ankle.  He discussed the risks and benefits of surgery. Risks include bleeding, infection, non-union, need for additional surgery, further hospitalization, or even death.  Patient acknowledges the risks of surgery and would like to proceed with planned surgical intervention.   Admit to inpatient care Plan for ORIF L trimalleolar fx tomorrow NPO after midnight.  Mechele Claude, PA-C, [employer] Office:  [phone]

## 2015-04-03 NOTE — ED Notes (Signed)
Left Message to Dr. Percell Miller @ [phone] for call back to [phone].

## 2015-04-03 NOTE — ED Notes (Signed)
Respiratory at bedside.

## 2015-04-03 NOTE — Progress Notes (Signed)
Patient admitted to [facility]. Patient complaining of "10/10" pain, states she does not want the ordered morphine or oxycodone IR, states that this medication does not work. MD paged regarding pain. Left lower extremity elevated, patient repositioned. Safety measures in place. Report given to oncoming RN.

## 2015-04-04 ENCOUNTER — Encounter (HOSPITAL_COMMUNITY): Payer: Self-pay | Admitting: Anesthesiology

## 2015-04-04 ENCOUNTER — Inpatient Hospital Stay (HOSPITAL_COMMUNITY): Payer: Medicare Other | Admitting: Anesthesiology

## 2015-04-04 ENCOUNTER — Encounter (HOSPITAL_COMMUNITY)
Admission: EM | Disposition: A | Discharge status: Home Health | Payer: Self-pay | Source: Home / Self Care | Attending: Orthopedic Surgery

## 2015-04-04 HISTORY — PX: ORIF ANKLE FRACTURE: SHX5408

## 2015-04-04 LAB — SURGICAL PCR SCREEN
MRSA, PCR: NEGATIVE
STAPHYLOCOCCUS AUREUS: NEGATIVE

## 2015-04-04 SURGERY — OPEN REDUCTION INTERNAL FIXATION (ORIF) ANKLE FRACTURE
Anesthesia: Regional | Site: Ankle | Laterality: Left

## 2015-04-04 MED ORDER — ACETAMINOPHEN 650 MG RE SUPP: 650.0000 mg | Freq: Four times a day (QID) | RECTAL | Status: DC | PRN

## 2015-04-04 MED ORDER — HYDROCHLOROTHIAZIDE 25 MG PO TABS
25.0000 mg | ORAL_TABLET | Freq: Every day | ORAL | Status: DC
  Administered 2015-04-04 – 2015-04-05 (×2): 25 mg via ORAL
  Filled 2015-04-04 (×2): qty 1

## 2015-04-04 MED ORDER — PROPOFOL 10 MG/ML IV BOLUS
INTRAVENOUS | Status: DC | PRN
  Administered 2015-04-04: 150 mg via INTRAVENOUS

## 2015-04-04 MED ORDER — FENTANYL CITRATE (PF) 250 MCG/5ML IJ SOLN
INTRAMUSCULAR | Status: AC
  Filled 2015-04-04: qty 5

## 2015-04-04 MED ORDER — LACTATED RINGERS IV SOLN
INTRAVENOUS | Status: DC
  Administered 2015-04-04 (×2): via INTRAVENOUS
  Administered 2015-04-04: 50 mL/h via INTRAVENOUS

## 2015-04-04 MED ORDER — MIDAZOLAM HCL 2 MG/2ML IJ SOLN
INTRAMUSCULAR | Status: AC
  Filled 2015-04-04: qty 4

## 2015-04-04 MED ORDER — MIDAZOLAM HCL 2 MG/2ML IJ SOLN
INTRAMUSCULAR | Status: AC
  Administered 2015-04-04: 1 mg
  Filled 2015-04-04: qty 2

## 2015-04-04 MED ORDER — OXYCODONE HCL 5 MG PO TABS: 5.0000 mg | ORAL_TABLET | Freq: Once | ORAL | Status: DC | PRN

## 2015-04-04 MED ORDER — ENOXAPARIN SODIUM 40 MG/0.4ML ~~LOC~~ SOLN
40.0000 mg | SUBCUTANEOUS | Status: DC
  Administered 2015-04-05: 40 mg via SUBCUTANEOUS
  Filled 2015-04-04: qty 0.4

## 2015-04-04 MED ORDER — LIDOCAINE HCL (CARDIAC) 20 MG/ML IV SOLN
INTRAVENOUS | Status: AC
  Filled 2015-04-04: qty 5

## 2015-04-04 MED ORDER — OXYCODONE HCL 5 MG/5ML PO SOLN: 5.0000 mg | Freq: Once | ORAL | Status: DC | PRN

## 2015-04-04 MED ORDER — FENTANYL CITRATE (PF) 100 MCG/2ML IJ SOLN
INTRAMUSCULAR | Status: AC
  Administered 2015-04-04: 50 ug
  Filled 2015-04-04: qty 2

## 2015-04-04 MED ORDER — BUPIVACAINE-EPINEPHRINE (PF) 0.5% -1:200000 IJ SOLN
INTRAMUSCULAR | Status: DC | PRN
  Administered 2015-04-04: 25 mL via PERINEURAL
  Administered 2015-04-04: 20 mL via PERINEURAL

## 2015-04-04 MED ORDER — HYDROMORPHONE HCL 1 MG/ML IJ SOLN: 0.2500 mg | INTRAMUSCULAR | Status: DC | PRN

## 2015-04-04 MED ORDER — FENTANYL CITRATE (PF) 100 MCG/2ML IJ SOLN
INTRAMUSCULAR | Status: DC | PRN
  Administered 2015-04-04: 50 ug via INTRAVENOUS

## 2015-04-04 MED ORDER — PROPOFOL 10 MG/ML IV BOLUS
INTRAVENOUS | Status: AC
  Filled 2015-04-04: qty 20

## 2015-04-04 MED ORDER — ACETAMINOPHEN 325 MG PO TABS
650.0000 mg | ORAL_TABLET | Freq: Four times a day (QID) | ORAL | Status: DC | PRN
Start: 2015-04-04 — End: 2015-04-05

## 2015-04-04 MED ORDER — 0.9 % SODIUM CHLORIDE (POUR BTL) OPTIME
TOPICAL | Status: DC | PRN
  Administered 2015-04-04: 1000 mL

## 2015-04-04 MED ORDER — ONDANSETRON HCL 4 MG/2ML IJ SOLN
INTRAMUSCULAR | Status: AC
  Filled 2015-04-04: qty 2

## 2015-04-04 MED ORDER — PROMETHAZINE HCL 25 MG/ML IJ SOLN: 6.2500 mg | INTRAMUSCULAR | Status: DC | PRN

## 2015-04-04 SURGICAL SUPPLY — 72 items
BANDAGE ESMARK 6X9 LF (GAUZE/BANDAGES/DRESSINGS) ×1 IMPLANT
BIT DRILL 2.5X2.75 QC CALB (BIT) ×2 IMPLANT
BIT DRILL 3.5X5.5 QC CALB (BIT) ×2 IMPLANT
BLADE SURG 15 STRL LF DISP TIS (BLADE) ×1 IMPLANT
BLADE SURG 15 STRL SS (BLADE)
BNDG CMPR 9X6 STRL LF SNTH (GAUZE/BANDAGES/DRESSINGS) ×1
BNDG COHESIVE 4X5 TAN STRL (GAUZE/BANDAGES/DRESSINGS) ×3 IMPLANT
BNDG COHESIVE 6X5 TAN STRL LF (GAUZE/BANDAGES/DRESSINGS) ×3 IMPLANT
BNDG ESMARK 6X9 LF (GAUZE/BANDAGES/DRESSINGS) ×3
CANISTER SUCT 3000ML PPV (MISCELLANEOUS) ×3 IMPLANT
CHLORAPREP W/TINT 26ML (MISCELLANEOUS) ×3 IMPLANT
COVER SURGICAL LIGHT HANDLE (MISCELLANEOUS) ×3 IMPLANT
CUFF TOURNIQUET SINGLE 34IN LL (TOURNIQUET CUFF) ×3 IMPLANT
CUFF TOURNIQUET SINGLE 44IN (TOURNIQUET CUFF) IMPLANT
DRAPE OEC MINIVIEW 54X84 (DRAPES) ×3 IMPLANT
DRAPE U-SHAPE 47X51 STRL (DRAPES) ×3 IMPLANT
DRSG ADAPTIC 3X8 NADH LF (GAUZE/BANDAGES/DRESSINGS) IMPLANT
DRSG MEPITEL 4X7.2 (GAUZE/BANDAGES/DRESSINGS) ×2 IMPLANT
DRSG PAD ABDOMINAL 8X10 ST (GAUZE/BANDAGES/DRESSINGS) ×2 IMPLANT
ELECT REM PT RETURN 9FT ADLT (ELECTROSURGICAL) ×3
ELECTRODE REM PT RTRN 9FT ADLT (ELECTROSURGICAL) ×1 IMPLANT
GAUZE SPONGE 4X4 12PLY STRL (GAUZE/BANDAGES/DRESSINGS) ×2 IMPLANT
GLOVE BIO SURGEON STRL SZ7 (GLOVE) ×6 IMPLANT
GLOVE BIO SURGEON STRL SZ8 (GLOVE) ×3 IMPLANT
GLOVE BIOGEL PI IND STRL 7.5 (GLOVE) ×1 IMPLANT
GLOVE BIOGEL PI IND STRL 8 (GLOVE) ×1 IMPLANT
GLOVE BIOGEL PI INDICATOR 7.5 (GLOVE) ×2
GLOVE BIOGEL PI INDICATOR 8 (GLOVE) ×4
GLOVE BIOGEL PI ORTHO PRO SZ7 (GLOVE) ×2
GLOVE ECLIPSE 7.5 STRL STRAW (GLOVE) ×2 IMPLANT
GLOVE PI ORTHO PRO STRL SZ7 (GLOVE) IMPLANT
GLOVE SURG SS PI 7.5 STRL IVOR (GLOVE) ×2 IMPLANT
GOWN STRL REUS W/ TWL LRG LVL3 (GOWN DISPOSABLE) ×2 IMPLANT
GOWN STRL REUS W/ TWL XL LVL3 (GOWN DISPOSABLE) ×1 IMPLANT
GOWN STRL REUS W/TWL LRG LVL3 (GOWN DISPOSABLE) ×6
GOWN STRL REUS W/TWL XL LVL3 (GOWN DISPOSABLE) ×3
KIT BASIN OR (CUSTOM PROCEDURE TRAY) ×3 IMPLANT
KIT ROOM TURNOVER OR (KITS) ×3 IMPLANT
NEEDLE 22X1 1/2 (OR ONLY) (NEEDLE) ×2 IMPLANT
NS IRRIG 1000ML POUR BTL (IV SOLUTION) ×3 IMPLANT
PACK ORTHO EXTREMITY (CUSTOM PROCEDURE TRAY) ×3 IMPLANT
PAD ABD 8X10 STRL (GAUZE/BANDAGES/DRESSINGS) ×4 IMPLANT
PAD ARMBOARD 7.5X6 YLW CONV (MISCELLANEOUS) ×4 IMPLANT
PAD CAST 4YDX4 CTTN HI CHSV (CAST SUPPLIES) ×1 IMPLANT
PADDING CAST COTTON 4X4 STRL (CAST SUPPLIES) ×3
PADDING CAST COTTON 6X4 STRL (CAST SUPPLIES) ×2 IMPLANT
PLATE ACE 100DEG 8HOLE (Plate) ×2 IMPLANT
SCREW ACE CAN 4.0 40M (Screw) ×4 IMPLANT
SCREW CANC 4.0X14 (Screw) ×2 IMPLANT
SCREW CORT 3.5X16 815037016 (Screw) ×2 IMPLANT
SCREW CORTICAL 3.5MM  20MM (Screw) ×2 IMPLANT
SCREW CORTICAL 3.5MM 14MM (Screw) ×6 IMPLANT
SCREW CORTICAL 3.5MM 20MM (Screw) IMPLANT
SCREW CORTICAL 3.5MM 24MM (Screw) ×2 IMPLANT
SCREW NLOCK CANC HEX 4X16 (Screw) ×2 IMPLANT
SPONGE LAP 18X18 X RAY DECT (DISPOSABLE) ×1 IMPLANT
STAPLER VISISTAT 35W (STAPLE) IMPLANT
SUCTION FRAZIER TIP 10 FR DISP (SUCTIONS) ×3 IMPLANT
SUT ETHILON 3 0 PS 1 (SUTURE) ×2 IMPLANT
SUT MNCRL AB 3-0 PS2 18 (SUTURE) IMPLANT
SUT PROLENE 3 0 PS 2 (SUTURE) ×1 IMPLANT
SUT VIC AB 2-0 CT1 27 (SUTURE) ×3
SUT VIC AB 2-0 CT1 TAPERPNT 27 (SUTURE) ×2 IMPLANT
SUT VIC AB 3-0 PS2 18 (SUTURE) ×3
SUT VIC AB 3-0 PS2 18XBRD (SUTURE) ×1 IMPLANT
SYR CONTROL 10ML LL (SYRINGE) ×2 IMPLANT
TOWEL OR 17X24 6PK STRL BLUE (TOWEL DISPOSABLE) ×3 IMPLANT
TOWEL OR 17X26 10 PK STRL BLUE (TOWEL DISPOSABLE) ×3 IMPLANT
TUBE CONNECTING 12'X1/4 (SUCTIONS) ×1
TUBE CONNECTING 12X1/4 (SUCTIONS) ×2 IMPLANT
WATER STERILE IRR 1000ML POUR (IV SOLUTION) ×1 IMPLANT
WIRE K 1.6MM 144256 (MISCELLANEOUS) ×4 IMPLANT

## 2015-04-04 NOTE — Interval H&P Note (Signed)
History and Physical Interval Note:  04/04/2015 12:16 PM  [name]  has presented today for surgery, with the diagnosis of Left Ankle Fx  The various methods of treatment have been discussed with the patient and family. After consideration of risks, benefits and other options for treatment, the patient has consented to  Procedure(s): OPEN REDUCTION INTERNAL FIXATION (ORIF) ANKLE FRACTURE (Left) as a surgical intervention .  The patient's history has been reviewed, patient examined, no change in status, stable for surgery.  I have reviewed the patient's chart and labs.  Questions were answered to the patient's satisfaction.    The risks and benefits of the alternative treatment options have been discussed in detail.  The patient wishes to proceed with surgery and specifically understands risks of bleeding, infection, nerve damage, blood clots, need for additional surgery, amputation and death.   Wylene Simmer

## 2015-04-04 NOTE — Brief Op Note (Signed)
04/03/2015 - 04/04/2015  2:20 PM  PATIENT:  [name]  69 y.o. female  PRE-OPERATIVE DIAGNOSIS:  Left ankle trimal fracture / dislocation s/p closed reduction  POST-OPERATIVE DIAGNOSIS:  same  Procedure(s): 1.  ORIF left ankle trimal fracture without fixation of the posterior lip   2.  Left ankle ap, mortise and lateral xrays  SURGEON:  Wylene Simmer, MD  ASSISTANT: Mechele Claude, PA-C  ANESTHESIA:   General, regional  EBL:  minimal   TOURNIQUET:   Total Tourniquet Time Documented: Thigh (Left) - 49 minutes Total: Thigh (Left) - 49 minutes  COMPLICATIONS:  None apparent  DISPOSITION:  Extubated, awake and stable to recovery.  DICTATION ID:  [id number]

## 2015-04-04 NOTE — H&P (View-Only) (Signed)
[name] is an 69 y.o. female.   Chief Complaint: L ankle pain HPI: This patient was seen in the ED by Dr. Doran Durand on 04/03/15.  Patient is a 69 year old female with the Gary listed below that presented to the [facility] ED on 04/03/15 after sustaining a fall at home last night while getting up to go to the restroom.  She noted severe pain and obvious deformity to her left ankle, was unable to WB, called 911 and was transported to the ED via EMS.  X-rays in the ED demonstrate a left trimalleolar fracture and ankle dislocation.  Dr. Fredonia Highland was originally consulted for evaluation, and he successfully reduced the ankle in the ED.  He then consulted Dr. Doran Durand as he is familiar with the patient and her medical history.  Dr. Doran Durand reviewed the results of the x-rays of the patient and stressed the seriousness of her injury with her and her family.  She is a long time patient of Dr. Nona Dell and underwent surgery in December of 2013 on her left foot for hammertoe corrections.  Past Medical History  Diagnosis Date  . Benign neoplasm of colon   . Depressive disorder, not elsewhere classified   . Anxiety state, unspecified   . Other and unspecified hyperlipidemia   . Esophageal reflux   . Obesity   . Hypothyroidism   . Hemorrhoids   . Colon polyps     hyperplastic  . Family history of colon cancer mother  . Lymphocytic colitis   . Contact lens/glasses fitting     wears glassses or contacts  . Complications affecting other specified body systems, hypertension     No cardiologist   . Seizures     x 1  . Brain tumor     Past Surgical History  Procedure Laterality Date  . Craniotomy    . Back surgery    . Abdominal hysterectomy    . Appendectomy    . Carpal tunnel release      right  . Brain surgery  1999    benign tumor  . Hammertoe reconstruction with weil osteotomy  07/07/2012    Procedure: HAMMERTOE RECONSTRUCTION WITH WEIL OSTEOTOMY;  Surgeon: Wylene Simmer, MD;  Location: [facility];  Service: Orthopedics;  Laterality: Left;  Left Second through [place]; Left Fifth Toe Flexor Tendon Release, and Left Second through Fourth Weil Osteotomy    Family History  Problem Relation Age of Onset  . Breast cancer Mother   . Colon cancer Mother   . Lung cancer Maternal Uncle   . Bone cancer Maternal Grandmother   . Heart attack Father    Social History:  reports that she quit smoking about 4 years ago. She has never used smokeless tobacco. She reports that she drinks about 3.5 oz of alcohol per week. She reports that she does not use illicit drugs.  Allergies:  Allergies  Allergen Reactions  . Codeine Other (See Comments)    nightmares  . Hydrocod Polst-Cpm Polst Er Itching  . Hydrocodone-Acetaminophen Itching    Has to take benadryl with to tolerate  . Sulfa Antibiotics   . Hydrocodone Itching    Has to take benadryl to tolerate med  . Sulfonamide Derivatives Rash     (Not in a hospital admission)  Results for orders placed or performed during the hospital encounter of 04/03/15 (from the past 48 hour(s))  CBC with Differential/Platelet     Status: Abnormal  Collection Time: 04/03/15  3:15 AM  Result Value Ref Range   WBC 7.8 4.0 - 10.5 K/uL   RBC 3.70 (L) 3.87 - 5.11 MIL/uL   Hemoglobin 11.9 (L) 12.0 - 15.0 g/dL   HCT 35.4 (L) 36.0 - 46.0 %   MCV 95.7 78.0 - 100.0 fL   MCH 32.2 26.0 - 34.0 pg   MCHC 33.6 30.0 - 36.0 g/dL   RDW 13.5 11.5 - 15.5 %   Platelets 202 150 - 400 K/uL   Neutrophils Relative % 69 43 - 77 %   Neutro Abs 5.5 1.7 - 7.7 K/uL   Lymphocytes Relative 22 12 - 46 %   Lymphs Abs 1.7 0.7 - 4.0 K/uL   Monocytes Relative 5 3 - 12 %   Monocytes Absolute 0.4 0.1 - 1.0 K/uL   Eosinophils Relative 3 0 - 5 %   Eosinophils Absolute 0.2 0.0 - 0.7 K/uL   Basophils Relative 1 0 - 1 %   Basophils Absolute 0.1 0.0 - 0.1 K/uL  Basic metabolic panel     Status: Abnormal   Collection Time: 04/03/15  3:15 AM  Result  Value Ref Range   Sodium 141 135 - 145 mmol/L   Potassium 3.4 (L) 3.5 - 5.1 mmol/L   Chloride 107 101 - 111 mmol/L   CO2 26 22 - 32 mmol/L   Glucose, Bld 146 (H) 65 - 99 mg/dL   BUN 13 6 - 20 mg/dL   Creatinine, Ser 0.78 0.44 - 1.00 mg/dL   Calcium 9.7 8.9 - 10.3 mg/dL   GFR calc non Af Amer >60 >60 mL/min   GFR calc Af Amer >60 >60 mL/min    Comment: (NOTE) The eGFR has been calculated using the CKD EPI equation. This calculation has not been validated in all clinical situations. eGFR's persistently <60 mL/min signify possible Chronic Kidney Disease.    Anion gap 8 5 - 15   Dg Tibia/fibula Left  04/03/2015   CLINICAL DATA:  Left ankle pain after a fall.  EXAM: LEFT TIBIA AND FIBULA - 2 VIEW  COMPARISON:  Left ankle 04/03/2015  FINDINGS: Trimalleolar fracture dislocation of the left ankle. See description of left ankle views 04/03/2015. Proximal tibia and fibula are otherwise intact. No additional fractures identified.  IMPRESSION: Trimalleolar fracture and dislocation of the left ankle. Proximal tibia and fibula appear intact.   Electronically Signed   By: Lucienne Capers M.D.   On: 04/03/2015 03:19   Dg Ankle Complete Left  04/03/2015   CLINICAL DATA:  Left ankle pain after a fall.  Obvious deformity.  EXAM: LEFT ANKLE COMPLETE - 3+ VIEW  COMPARISON:  None.  FINDINGS: Transverse comminuted fracture of the medial malleolus. Oblique fracture of the lateral malleolus. Lateral displacement of the fracture fragments as well as the talus with respect to the tibia. Overriding of the fibular fracture fragments. Probable displaced fracture off of the posterior malleolus. Anterior dislocation of the talus with respect to the tibia.  IMPRESSION: Trimalleolar fracture and dislocation of the left ankle.   Electronically Signed   By: Lucienne Capers M.D.   On: 04/03/2015 03:18   Dg Ankle Left Port  04/03/2015   CLINICAL DATA:  Postreduction left ankle  EXAM: PORTABLE LEFT ANKLE - 2 VIEW  COMPARISON:   04/03/2015  FINDINGS: Overlying cast material obscures bone detail. Single AP view only obtained. There is improved alignment of the tibiotalar joint and lateral malleolar fracture since previous study. Mild residual lateral displacement of  the lateral and medial malleolar fragments.  IMPRESSION: Improved alignment of tibiotalar joint and malleolar fractures since previous study.   Electronically Signed   By: Lucienne Capers M.D.   On: 04/03/2015 05:42   Dg Ankle Left Port  04/03/2015   CLINICAL DATA:  Postreduction left ankle  EXAM: PORTABLE LEFT ANKLE - 2 VIEW  COMPARISON:  04/03/2015  FINDINGS: Interval placement of cast material which obscures some bone detail. Trimalleolar fractures of the left ankle. Improved alignment and position of fracture fragments although there is still significant lateral dislocation and angulation of the talus and medial on lateral malleolar fracture fragments with respect to the distal tibia. Mild displacement of the posterior malleolar fragment.  IMPRESSION: Improved position of try malleolar fractures of the left ankle but with residual lateral displacement of distal fracture fragments and of the talus with respect to the tibia.   Electronically Signed   By: Lucienne Capers M.D.   On: 04/03/2015 04:38    ROS Gen: Denies fever, chills, weight change, fatigue, night sweats MSK: Reports moderate to severe pain in LLE. Neuro: Denies headache, numbness, weakness, slurred speech, loss of memory or consciousness Derm: Denies rash, dry skin, scaling or peeling skin change HEENT: Denies blurred vision, double vision, neck stiffness, dysphagia PULM: Denies shortness of breath, cough, sputum production, hemoptysis, wheezing CV: Denies chest pain, edema, orthopnea, palpitations GI: Denies abdominal pain, nausea, vomiting, diarrhea, hematochezia, melena, constipation  Endocrine: Denies hot or cold intolerance, polyuria, polyphagia or appetite change Heme: Denies easy bruising,  bleeding, bleeding gums   There were no vitals taken for this visit. Physical Exam  Constitutional: She is oriented to person, place, and time. She appears well-developed and well-nourished.  HENT:  Head: Normocephalic and atraumatic.  Eyes: EOM are normal. Pupils are equal, round, and reactive to light. Right eye exhibits no discharge. Left eye exhibits no discharge. No scleral icterus.  Neck: Normal range of motion.  Cardiovascular:  Popliteal pulse 2+.  + Cap refill  Respiratory: Effort normal. No stridor. No respiratory distress.  GI: There is no tenderness.  Musculoskeletal: She exhibits edema and tenderness.  Patient in LLE posterior splint.    Neurological: She is alert and oriented to person, place, and time.  Skin: Skin is warm and dry. No erythema.  Psychiatric: She has a normal mood and affect.     Assessment/Plan L ankle trimalleolar fracture and reduced ankle dislocation  Dr. Doran Durand took time to discuss that the only way for this injury to heal normally is via surgical intervention, specifically ORIF L ankle.  He discussed the risks and benefits of surgery. Risks include bleeding, infection, non-union, need for additional surgery, further hospitalization, or even death.  Patient acknowledges the risks of surgery and would like to proceed with planned surgical intervention.   Admit to inpatient care Plan for ORIF L trimalleolar fx tomorrow NPO after midnight.  Mechele Claude, PA-C, [employer] Office:  [phone]

## 2015-04-04 NOTE — Anesthesia Procedure Notes (Addendum)
Anesthesia Regional Block:  Adductor canal block  Pre-Anesthetic Checklist: ,, timeout performed, Correct Patient, Correct Site, Correct Laterality, Correct Procedure, Correct Position, site marked, Risks and benefits discussed,  Surgical consent,  Pre-op evaluation,  At surgeon's request and post-op pain management  Laterality: Left  Prep: chloraprep       Needles:  Injection technique: Single-shot  Needle Type: Echogenic Needle     Needle Length: 9cm 9 cm Needle Gauge: 21 and 21 G    Additional Needles:  Procedures: ultrasound guided (picture in chart) Adductor canal block Narrative:  Start time: 04/04/2015 12:00 PM End time: 04/04/2015 12:07 PM Injection made incrementally with aspirations every 5 mL.  Performed by: Personally  Anesthesiologist: Suzette Battiest   Anesthesia Regional Block:  Popliteal block  Pre-Anesthetic Checklist: ,, timeout performed, Correct Patient, Correct Site, Correct Laterality, Correct Procedure, Correct Position, site marked, Risks and benefits discussed,  Surgical consent,  Pre-op evaluation,  At surgeon's request and post-op pain management  Laterality: Left  Prep: chloraprep       Needles:  Injection technique: Single-shot  Needle Type: Echogenic Needle     Needle Length: 9cm 9 cm Needle Gauge: 21 and 21 G    Additional Needles:  Procedures: ultrasound guided (picture in chart) Popliteal block Narrative:  Start time: 04/04/2015 12:08 PM End time: 04/04/2015 12:16 PM Injection made incrementally with aspirations every 5 mL.  Performed by: Personally  Anesthesiologist: Suzette Battiest   Procedure Name: LMA Insertion Date/Time: 04/04/2015 12:43 PM Performed by: Jenne Campus Pre-anesthesia Checklist: Patient identified, Emergency Drugs available, Suction available, Patient being monitored and Timeout performed Patient Re-evaluated:Patient Re-evaluated prior to inductionOxygen Delivery Method: Circle system  utilized Preoxygenation: Pre-oxygenation with 100% oxygen Intubation Type: IV induction Ventilation: Mask ventilation without difficulty LMA: LMA inserted LMA Size: 4.0 Number of attempts: 1 Placement Confirmation: positive ETCO2,  CO2 detector and breath sounds checked- equal and bilateral Tube secured with: Tape Dental Injury: Teeth and Oropharynx as per pre-operative assessment

## 2015-04-04 NOTE — Anesthesia Postprocedure Evaluation (Signed)
  Anesthesia Post-op Note  Patient: [name]  Procedure(s) Performed: Procedure(s): OPEN REDUCTION INTERNAL FIXATION (ORIF) LEFT ANKLE FRACTURE (Left)  Patient Location: PACU  Anesthesia Type:GA combined with regional for post-op pain  Level of Consciousness: awake and alert   Airway and Oxygen Therapy: Patient Spontanous Breathing  Post-op Pain: none  Post-op Assessment: Post-op Vital signs reviewed LLE Motor Response: No movement due to regional block LLE Sensation: Full sensation RLE Motor Response: Purposeful movement, Responds to commands RLE Sensation: Full sensation      Post-op Vital Signs: Reviewed  Last Vitals:  Filed Vitals:   04/04/15 1508  BP:   Pulse: 77  Temp: 36.8 C  Resp: 22    Complications: No apparent anesthesia complications

## 2015-04-04 NOTE — Anesthesia Preprocedure Evaluation (Signed)
Anesthesia Evaluation  Patient identified by MRN, date of birth, ID band Patient awake    Reviewed: Allergy & Precautions, NPO status , Patient's Chart, lab work & pertinent test results  Airway Mallampati: III  TM Distance: >3 FB Neck ROM: Full    Dental   Pulmonary former smoker,    breath sounds clear to auscultation- rhonchi       Cardiovascular hypertension, Pt. on medications  Rhythm:Regular Rate:Normal     Neuro/Psych Anxiety Depression negative neurological ROS     GI/Hepatic Neg liver ROS, GERD  ,  Endo/Other  Hypothyroidism Morbid obesity  Renal/GU negative Renal ROS     Musculoskeletal negative musculoskeletal ROS (+)   Abdominal   Peds  Hematology  (+) anemia ,   Anesthesia Other Findings   Reproductive/Obstetrics                             Anesthesia Physical Anesthesia Plan  ASA: III  Anesthesia Plan: General and Regional   Post-op Pain Management: GA combined w/ Regional for post-op pain   Induction: Intravenous  Airway Management Planned: LMA  Additional Equipment:   Intra-op Plan:   Post-operative Plan:   Informed Consent: I have reviewed the patients History and Physical, chart, labs and discussed the procedure including the risks, benefits and alternatives for the proposed anesthesia with the patient or authorized representative who has indicated his/her understanding and acceptance.     Plan Discussed with: CRNA  Anesthesia Plan Comments:         Anesthesia Quick Evaluation

## 2015-04-04 NOTE — Transfer of Care (Signed)
Immediate Anesthesia Transfer of Care Note  Patient: [name]  Procedure(s) Performed: Procedure(s): OPEN REDUCTION INTERNAL FIXATION (ORIF) LEFT ANKLE FRACTURE (Left)  Patient Location: PACU  Anesthesia Type:General  Level of Consciousness: awake, oriented and patient cooperative  Airway & Oxygen Therapy: Patient Spontanous Breathing and Patient connected to nasal cannula oxygen  Post-op Assessment: Report given to RN and Post -op Vital signs reviewed and stable  Post vital signs: Reviewed  Last Vitals:  Filed Vitals:   04/04/15 1415  BP: 143/63  Pulse: 85  Temp: 36.4 C  Resp: 16    Complications: No apparent anesthesia complications

## 2015-04-05 ENCOUNTER — Encounter (HOSPITAL_COMMUNITY): Payer: Self-pay | Admitting: Orthopedic Surgery

## 2015-04-05 MED ORDER — SENNA 8.6 MG PO TABS: 2.0000 | ORAL_TABLET | Freq: Two times a day (BID) | ORAL | Status: DC

## 2015-04-05 MED ORDER — OXYCODONE HCL 5 MG PO TABS: 5.0000 mg | ORAL_TABLET | ORAL | Status: DC | PRN

## 2015-04-05 MED ORDER — DOCUSATE SODIUM 100 MG PO CAPS: 100.0000 mg | ORAL_CAPSULE | Freq: Two times a day (BID) | ORAL | Status: DC

## 2015-04-05 MED ORDER — ASPIRIN EC 325 MG PO TBEC: 325.0000 mg | DELAYED_RELEASE_TABLET | Freq: Every day | ORAL | Status: DC

## 2015-04-05 MED ORDER — WHITE PETROLATUM GEL
Status: DC | PRN
  Administered 2015-04-05: 0.2 via TOPICAL
  Filled 2015-04-05: qty 1

## 2015-04-05 NOTE — Discharge Summary (Signed)
Physician Discharge Summary  Patient ID: [name] MRN: [id number] DOB/AGE: 1946-07-26 69 y.o.  Admit date: 04/03/2015 Discharge date: 04/05/2015  Admission Diagnoses: Left ankle closed trimalleolar fracture; hyperlipidemia; anxiety; depression; arrhythmia; GERD; HTN; hemorrhoids; hiatal hernia; lumbar radiculopathy  Discharge Diagnoses:  Active Problems:   Trimalleolar fracture of ankle, closed same as above  Discharged Condition: stable  Hospital Course: Patient presented to the [facility] ED via EMS on 04/03/15 after sustaining a fall at home.  X-ray in ED demonstrated L trimalleolar fracture and ankle dislocation.  Dr. Fredonia Highland was initially consulted and reduced the dislocated ankle.  Dr. Doran Durand was then consulted as the patient is a long time patient of his.  The patient was admitted to the hospital overnight and brought to the OR on 04/04/15, where Dr. Doran Durand performed a L ankle ORIF for her trimalleolar fracture.  The patient tolerated the procedure well without complication.  The patient then stayed overnight in inpatient care.  PT/OT and social work were consulted during her stay.  The patient tolerated her stay in the hospital well without complication, and discharged home with home health per PT/OT recommendation.  Consults: orthopedic surgery and PT/OT, social work  Significant Diagnostic Studies: radiology: X-ray revealed Left trimalleolar fracture and dislocation upon presentation to ED  Treatments: IV hydration, antibiotics: Ancef, analgesia: acetaminophen w/ codeine and Dilaudid, cardiac meds: HCTZ, anticoagulation: lovenox, therapies: PT, OT and SW and surgery: as stated above  Discharge Exam: Blood pressure 137/63, pulse 72, temperature 98.6 F (37 C), temperature source Oral, resp. rate 18, weight 93 kg (205 lb 0.4 oz), SpO2 97 %. General: WDWN patient in NAD. Psych:  Appropriate mood and affect. Neuro:  A&O x 3, Moving all extremities, sensation intact to light  touch HEENT:  EOMs intact Chest:  Even non-labored respirations Skin:  Posterior splint and dressing are C/D/I Extremities: warm/dry, mild edema to toes, no erythmea or echymosis.  No lymphadenopathy. Pulses: Popliteal 2+ MSK:  ROM: Minimal DF/PF of toes as this is her baseline with PMH of drop foot and hammertoe corrections, MMT: Patient can perform quad set   Disposition: 01-Home or Self Care with home health  Discharge Instructions    Call MD / Call 911    Complete by:  As directed   If you experience chest pain or shortness of breath, CALL 911 and be transported to the hospital emergency room.  If you develope a fever above 101 F, pus (white drainage) or increased drainage or redness at the wound, or calf pain, call your surgeon's office.     Constipation Prevention    Complete by:  As directed   Drink plenty of fluids.  Prune juice may be helpful.  You may use a stool softener, such as Colace (over the counter) 100 mg twice a day.  Use MiraLax (over the counter) for constipation as needed.     Diet - low sodium heart healthy    Complete by:  As directed      Increase activity slowly as tolerated    Complete by:  As directed      Non weight bearing    Complete by:  As directed   Laterality:  left  Extremity:  Lower            Medication List    STOP taking these medications        aspirin 81 MG tablet  Replaced by:  aspirin EC 325 MG tablet     diphenoxylate-atropine 2.5-0.025 MG  per tablet  Commonly known as:  LOMOTIL      TAKE these medications        aspirin EC 325 MG tablet  Take 1 tablet (325 mg total) by mouth daily.     atorvastatin 40 MG tablet  Commonly known as:  LIPITOR  Take 1 tablets by mouth once daily.     BIOTIN PO  Take 1 tablet by mouth daily.     Calcium Carbonate-Vitamin D 600-400 MG-UNIT per tablet  Take 1 tablet by mouth 2 (two) times daily.     cetirizine 10 MG tablet  Commonly known as:  ZYRTEC  Take 10 mg by mouth daily.      docusate sodium 100 MG capsule  Commonly known as:  COLACE  Take 1 capsule (100 mg total) by mouth 2 (two) times daily. While taking narcotic pain medicine.     esomeprazole 40 MG capsule  Commonly known as:  NEXIUM  Take 1 capsule (40 mg total) by mouth daily at 12 noon.     Fish Oil 1000 MG Caps  Take 1 capsule by mouth 2 (two) times daily.     fluticasone 50 MCG/ACT nasal spray  Commonly known as:  FLONASE  Place 2 sprays into the nose daily.     hydrochlorothiazide 25 MG tablet  Commonly known as:  HYDRODIURIL  Take 25 mg by mouth daily.     HYDROmorphone 2 MG tablet  Commonly known as:  DILAUDID  Take 1 tablet (2 mg total) by mouth 3 (three) times daily as needed for severe pain.     levothyroxine 88 MCG tablet  Commonly known as:  SYNTHROID, LEVOTHROID  Take 88 mcg by mouth daily.     nabumetone 500 MG tablet  Commonly known as:  RELAFEN  Take 500 mg by mouth 2 (two) times daily.     oxyCODONE 5 MG immediate release tablet  Commonly known as:  ROXICODONE  Take 1-2 tablets (5-10 mg total) by mouth every 4 (four) hours as needed for moderate pain or severe pain.     senna 8.6 MG Tabs tablet  Commonly known as:  SENOKOT  Take 2 tablets (17.2 mg total) by mouth 2 (two) times daily.     sertraline 100 MG tablet  Commonly known as:  ZOLOFT  Take 100 mg by mouth daily.     SLEEP AID PO  Take 1 tablet by mouth at bedtime.     vitamin C 1000 MG tablet  Take 1,000 mg by mouth daily.           Follow-up Information    Follow up with Wylene Simmer, MD. Go today.   Specialty:  Orthopedic Surgery   Why:  for close follow up of your ankle fracture   Contact information:   [address] [place] [address] [place] [address] [phone]       Follow up with Wylene Simmer, MD. Schedule an appointment as soon as possible for a visit in 2 weeks.   Specialty:  Orthopedic Surgery   Contact information:   [address] [place]  [address] [phone]       Signed: Mechele Claude, Hershal Coria, [facility] Office:  [phone]

## 2015-04-05 NOTE — Discharge Instructions (Signed)
Ankle Fracture [name], you have a trimalleolar fracture which was put in a cast. See Dr. Doran Durand today in clinic, call to make an appointment and be sure to tell them the name of your fracture. If any symptoms worsen, including numbness, tingling, or feeling that the cast is too tight, come back to emergency department immediately. Thank you. A fracture is a break in a bone. A cast or splint may be used to protect the ankle and heal the break. Sometimes, surgery is needed. HOME CARE  Use crutches as told by your doctor. It is very important that you use your crutches correctly.  Do not put weight or pressure on the injured ankle until told by your doctor.  Keep your ankle raised (elevated) when sitting or lying down.  Apply ice to the ankle:  Put ice in a plastic bag.  Place a towel between your cast and the bag.  Leave the ice on for 20 minutes, 2-3 times a day.  If you have a plaster or fiberglass cast:  Do not try to scratch under the cast with any objects.  Check the skin around the cast every day. You may put lotion on red or sore areas.  Keep your cast dry and clean.  If you have a plaster splint:  Wear the splint as told by your doctor.  You can loosen the elastic around the splint if your toes get numb, tingle, or turn cold or blue.  Do not put pressure on any part of your cast or splint. It may break. Rest your plaster splint or cast only on a pillow the first 24 hours until it is fully hardened.  Cover your cast or splint with a plastic bag during showers.  Do not lower your cast or splint into water.  Take medicine as told by your doctor.  Do not drive until your doctor says it is safe.  Follow-up with your doctor as told. It is very important that you go to your follow-up visits. GET HELP IF: The swelling and discomfort gets worse.  GET HELP RIGHT AWAY IF:   Your splint or cast breaks.  You continue to have very bad pain.  You have new pain or  swelling after your splint or cast was put on.  Your skin or toes below the injured ankle:  Turn blue or gray.  Feel cold, numb, or you cannot feel them.  There is a bad smell or yellowish white fluid (pus) coming from under the splint or cast. MAKE SURE YOU:   Understand these instructions.  Will watch your condition.  Will get help right away if you are not doing well or get worse. Document Released: 05/10/2009 Document Revised: 05/03/2013 Document Reviewed: 02/09/2013 [facility] Patient Information 2015 [place], [place]. This information is not intended to replace advice given to you by your health care provider. Make sure you discuss any questions you have with your health care provider.  Mackenzie Simmer, MD [place]  Please read the following information regarding your care after surgery.  Medications  You only need a prescription for the narcotic pain medicine (ex. oxycodone, Percocet, Norco).  All of the other medicines listed below are available over the counter. X acetominophen (Tylenol) 650 mg every 4-6 hours as you need for minor pain X oxycodone as prescribed for moderate to severe pain ?   Narcotic pain medicine (ex. oxycodone, Percocet, Vicodin) will cause constipation.  To prevent this problem, take the following medicines while you are taking  any pain medicine. X docusate sodium (Colace) 100 mg twice a day X senna (Senokot) 2 tablets twice a day  X To help prevent blood clots, take an aspirin (325 mg) once a day for a month after surgery.  You should also get up every hour while you are awake to move around.    Weight Bearing X Do not bear any weight on the operated leg or foot.  Cast / Splint / Dressing X Keep your splint or cast clean and dry.  Dont put anything (coat hanger, pencil, etc) down inside of it.  If it gets damp, use a hair dryer on the cool setting to dry it.  If it gets soaked, call the office to schedule an appointment for a cast  change.   After your dressing, cast or splint is removed; you may shower, but do not soak or scrub the wound.  Allow the water to run over it, and then gently pat it dry.  Swelling It is normal for you to have swelling where you had surgery.  To reduce swelling and pain, keep your toes above your nose for at least 3 days after surgery.  It may be necessary to keep your foot or leg elevated for several weeks.  If it hurts, it should be elevated.  Follow Up Call my office at [phone] when you are discharged from the hospital or surgery center to schedule an appointment to be seen two weeks after surgery.  Call my office at [phone] if you develop a fever >101.5 F, nausea, vomiting, bleeding from the surgical site or severe pain.

## 2015-04-05 NOTE — Evaluation (Signed)
Physical Therapy Evaluation Patient Details Name: [name] MRN: [id number] DOB: Jun 29, 1946 Today's Date: 04/05/2015   History of Present Illness  69 yo female s/p ORIF L ankle DXA:JOINOMV, esophageal reflux, anxiety, depressive disorder, seizures x1, brain tumor, craniotomy, back surg 2014, carpal tunnel release R UE  Clinical Impression  Patient states that she is very motivated to go home upon D/C but also recognizes that it will depend on her progression with mobility. We will progress at this time with anticipation of D/C home with recommended home health PT to follow. Discussion with patient and spouse included need to be able to ambulate a functional distance for home and navigate up/down stairs safely. Patient and spouse confirm understanding and that goal is to return home directly from hospital. Will continue to follow for mobility progression.     Follow Up Recommendations Home health PT;Supervision for mobility/OOB    Equipment Recommendations  Other (comment);None recommended by PT (Patient reports having all equipment needs met)    Recommendations for Other Services       Precautions / Restrictions Precautions Precautions: Fall Restrictions Weight Bearing Restrictions: Yes LLE Weight Bearing: Non weight bearing      Mobility  Bed Mobility Overal bed mobility: Needs Assistance Bed Mobility: Rolling;Supine to Sit Rolling: Min assist   Supine to sit: Min assist     General bed mobility comments: found and returned to chair  Transfers Overall transfer level: Needs assistance Equipment used: Rolling walker (2 wheeled) Transfers: Sit to/from Stand Sit to Stand: Min assist Stand pivot transfers: Mod assist;From elevated surface       General transfer comment: reminder for hand placement prior to standing  Ambulation/Gait Ambulation/Gait assistance: Mod assist Ambulation Distance (Feet): 12 Feet (2 feet X1, 10 feet X1) Assistive device: Rolling walker (2  wheeled) Gait Pattern/deviations:  (swing-to pattern) Gait velocity: decreased   General Gait Details: verbal and occasional tactile reminders for NWB LLE.   Stairs            Wheelchair Mobility    Modified Rankin (Stroke Patients Only)       Balance Overall balance assessment: Needs assistance Sitting-balance support: No upper extremity supported Sitting balance-Leahy Scale: Fair     Standing balance support: Bilateral upper extremity supported Standing balance-Leahy Scale: Poor Standing balance comment: using rw                             Pertinent Vitals/Pain Pain Assessment: Faces Pain Score: 0-No pain Faces Pain Scale: Hurts little more Pain Location: Lt ankle Pain Descriptors / Indicators: Guarding Pain Intervention(s): Monitored during session;Limited activity within patient's tolerance    Home Living Family/patient expects to be discharged to:: Private residence Living Arrangements: Spouse/significant other;Other relatives Available Help at Discharge: Family;Available 24 hours/day Type of Home: House Home Access: Stairs to enter Entrance Stairs-Rails: None Entrance Stairs-Number of Steps: 2 (2X1 step and 1X2 steps to enter home) Home Layout: Multi-level;Able to live on main level with bedroom/bathroom Home Equipment: Gilford Rile - 2 wheels;Wheelchair - power (knee scooter) Additional Comments: Husband leaving for [place] in approx. 1 week, will have other family present 24/7 to assist as needed.     Prior Function Level of Independence: Independent               Hand Dominance   Dominant Hand: Right    Extremity/Trunk Assessment   Upper Extremity Assessment: Overall WFL for tasks assessed  Lower Extremity Assessment: LLE deficits/detail (able to perform SLR)      Cervical / Trunk Assessment: Normal  Communication   Communication: No difficulties  Cognition Arousal/Alertness: Awake/alert Behavior During Therapy:  WFL for tasks assessed/performed Overall Cognitive Status: Within Functional Limits for tasks assessed                      General Comments General comments (skin integrity, edema, etc.): L LE wrapped and intacted    Exercises        Assessment/Plan    PT Assessment Patient needs continued PT services  PT Diagnosis Difficulty walking;Acute pain   PT Problem List Decreased strength;Decreased range of motion;Decreased activity tolerance;Decreased balance;Decreased mobility;Decreased knowledge of use of DME;Pain  PT Treatment Interventions DME instruction;Gait training;Stair training;Functional mobility training;Therapeutic exercise;Therapeutic activities;Balance training;Patient/family education   PT Goals (Current goals can be found in the Care Plan section) Acute Rehab PT Goals Patient Stated Goal: to go home PT Goal Formulation: With patient Time For Goal Achievement: 04/19/15 Potential to Achieve Goals: Fair    Frequency Min 5X/week   Barriers to discharge        Co-evaluation               End of Session Equipment Utilized During Treatment: Gait belt Activity Tolerance: Patient tolerated treatment well Patient left: in chair;with call bell/phone within reach;with family/visitor present Nurse Communication: Mobility status         Time: 2542-7062 PT Time Calculation (min) (ACUTE ONLY): 27 min   Charges:   PT Evaluation $Initial PT Evaluation Tier I: 1 Procedure PT Treatments $Gait Training: 8-22 mins   PT G Codes:        Cassell Clement, PT, CSCS Pager [phone] Office [phone]  04/05/2015, 11:46 AM

## 2015-04-05 NOTE — Op Note (Signed)
NAMEBRENTNEY, [name] NO.:  [id number]  MEDICAL RECORD NO.:  [id number]  LOCATION:  [id number]                        FACILITY:  [facility]  PHYSICIAN:  Wylene Simmer, MD        DATE OF BIRTH:  March 27, 1946  DATE OF PROCEDURE:  04/04/2015 DATE OF DISCHARGE:                              OPERATIVE REPORT   PREOPERATIVE DIAGNOSIS:  Left ankle trimalleolar fracture dislocation, status post closed reduction.  POSTOPERATIVE DIAGNOSIS:  Left ankle trimalleolar fracture dislocation, status post closed reduction.  PROCEDURE: 1. Open reduction and internal fixation of left ankle trimalleolar     fracture without fixation of posterior lip. 2. Left ankle AP, mortise, and lateral radiographs.  SURGEON:  Wylene Simmer, MD  ASSISTANT:  Mechele Claude, PA-C.  ANESTHESIA:  General and regional.  ESTIMATED BLOOD LOSS:  Minimal.  TOURNIQUET TIME:  49 minutes at 250 mmHg.  COMPLICATIONS:  None apparent.  DISPOSITION:  Extubated, awake and stable to recovery.  INDICATIONS FOR PROCEDURE:  The patient is a 69 year old female with past medical history of a left lower extremity footdrop after surgery for brain tumor.  She was getting out of bed 2 nights ago and tripped on the left foot.  She presented to the emergency department where radiographs revealed a left ankle trimalleolar fracture dislocation. She underwent closed reduction by Dr. Percell Brady.  She was then admitted to the hospital and has kept the ankle elevated.  She presents now for definitive surgical fixation of this unstable left ankle fracture.  She understands the risks and benefits, the alternative treatment options, and elects surgical treatment.  She specifically understands risks of bleeding, infection, nerve damage, blood clots, need for additional surgery, continued pain, amputation, nonunion, and death.  PROCEDURE IN DETAIL:  After preoperative consent was obtained and the correct operative site was identified, the  patient was brought to the operating room and placed supine on the operating table.  General anesthesia was induced.  Preoperative antibiotics were administered. Surgical time-out was taken.  The left lower extremity was prepped and draped in standard sterile fashion with tourniquet around the thigh. Extremity was exsanguinated.  The tourniquet was inflated to 250 mmHg. A longitudinal incision was then made over the lateral malleolus.  A sharp dissection was carried down through skin subcutaneous tissue.  The fracture site was identified.  It was opened and cleaned of all hematoma.  Periosteum was excised allowing visualization of the fracture line.  Fracture was reduced and held with a tenaculum.  A 3.5-mm fully- threaded lag screw was inserted from posterior to anterior across the fracture site.  A 1/3rd tubular plate was then contoured to fit the lateral malleolus.  It was secured proximally with 4 bicortical screws and distally with 2 unicortical screws.  AP and lateral radiographs confirmed appropriate position and length of all hardware and appropriate reduction of the fracture.  Attention was then turned to the medial malleolus where a longitudinal incision was made.  Sharp dissection was carried down through skin and subcutaneous tissue.  The fracture site was identified.  It was irrigated and cleaned of all hematoma.  Fracture was reduced and held with a tenaculum.  Two K-wires were  inserted across the fracture site. These were from the [employer] small frag set.  AP and lateral radiographs confirmed appropriate position of the guide pins.  Both guide pins were then utilized to insert 4 mm x 40 mm partially threaded cannulated screws.  Both were noted to have excellent purchase.  Wound was irrigated copiously.  AP, mortise, and lateral radiographs confirmed appropriate position and length of all hardware and appropriate reduction of the fractures.  The medial wound was closed with  Monocryl and nylon.  Laterally, the deep subcutaneous tissues were approximated with 0 Vicryl.  Superficial subcutaneous tissues were approximated with 3-0 Monocryl, and the skin was closed with a running 3-0 nylon.  Sterile dressings were applied, followed by well-padded short-leg splint. Tourniquet was released after application of the dressings at 49 minutes.  The patient was awakened from anesthesia and transported to the recovery room in stable condition.  FOLLOWUP PLAN:  The patient will be nonweightbearing on the left lower extremity.  She will need Physical Therapy and Occupational Therapy consultations and will likely need placement in a skilled nursing facility for short-term rehab.  RADIOGRAPHS:  AP, lateral, and mortise radiographs of the left ankle were obtained intraoperatively.  These show interval reduction and fixation of the medial and lateral malleolus fractures.  No other acute injuries are noted.  Mechele Claude, PA-C, was present and scrubbed for the duration of the case.  His assistance was essential in gaining and maintaining exposure, performing the operation, and closing and dressing of the wounds.     Wylene Simmer, MD     JH/MEDQ  D:  04/04/2015  T:  04/05/2015  Job:  [id number]

## 2015-04-05 NOTE — Evaluation (Signed)
Occupational Therapy Evaluation Patient Details Name: [name] MRN: [id number] DOB: 11-12-1945 Today's Date: 04/05/2015    History of Present Illness 69 yo female s/p ORIF L ankle [id number], esophageal reflux, anxiety, depressive disorder, seizures x1, brain tumor, craniotomy, back surg 2014, carpal tunnel release R UE   Clinical Impression   Patient is s/p ORIF L ankle surgery resulting in functional limitations due to the deficits listed below (see OT problem list). PTA independent with all adls and driving.  Patient will benefit from skilled OT acutely to increase independence and safety with ADLS to allow discharge [place] pending progress. Pt currently with difficulty maintaining NWB L LE however showed progress with second attempt. OT to follow closely and update as appropriate.      Follow Up Recommendations  Home health OT    Equipment Recommendations       Recommendations for Other Services       Precautions / Restrictions Precautions Precautions: Fall Restrictions Weight Bearing Restrictions: Yes LLE Weight Bearing: Non weight bearing      Mobility Bed Mobility Overal bed mobility: Needs Assistance Bed Mobility: Rolling;Supine to Sit Rolling: Min assist   Supine to sit: Min assist     General bed mobility comments: pt able to sequence with min cues and (A) for L LE . First attempt at [place] Overall transfer level: Needs assistance Equipment used: Rolling walker (2 wheeled) Transfers: Sit to/from [facility] Sit to Stand: Mod assist Stand pivot transfers: Mod assist;From elevated surface       General transfer comment: cues for hand placement. Pt pulling on RW at this time. Pt educated that [place] with one UE will be required for safety    Balance Overall balance assessment: Needs assistance         Standing balance support: Bilateral upper extremity supported;During functional activity Standing balance-Leahy Scale:  Poor                              ADL Overall ADL's : Needs assistance/impaired Eating/Feeding: Independent   Grooming: Oral care;Set up;Sitting       Lower Body Bathing: Maximal assistance           Toilet Transfer: Moderate assistance;Stand-pivot;RW;BSC Toilet Transfer Details (indicate cue type and reason): pt pivoting toward the chair and demonstrates poor NWB status with max cues to correct. Pt states "oh I had no idea" Pt able to shift weight toward R LE with cues   Toileting - Clothing Manipulation Details (indicate cue type and reason): unable to void at this time but reports voiding all night on bed pan       General ADL Comments: Pt pleasant and eager to participate. Pt expresses desire to have therapy at maximum amount acutely. Pt transfered EOB to 3n1 and then to chair . pt demonstrates incr ability to transfer with NWB status second attempt. PT and spouse educated on concern for NWB L LE being a barrier for home discharge if unable to maintain.     Vision     Perception     Praxis      Pertinent Vitals/Pain Pain Assessment: 0-10 Pain Score: 0-No pain     Hand Dominance Right   Extremity/Trunk Assessment Upper Extremity Assessment Upper Extremity Assessment: Overall WFL for tasks assessed   Lower Extremity Assessment Lower Extremity Assessment: RLE deficits/detail   Cervical / Trunk Assessment Cervical / Trunk Assessment: Normal   Communication Communication Communication:  No difficulties   Cognition Arousal/Alertness: Awake/alert Behavior During Therapy: WFL for tasks assessed/performed Overall Cognitive Status: Within Functional Limits for tasks assessed                     General Comments       Exercises       Shoulder Instructions      Home Living Family/patient expects to be discharged to:: Private residence Living Arrangements: Spouse/significant other;Other relatives Available Help at Discharge:  Family;Available 24 hours/day Type of Home: House Home Access: Stairs to enter [employer] of Steps:  (walk way 3 steps then 1 step into the house) Entrance Stairs-Rails: None Home Layout: Multi-level;Able to live on main level with bedroom/bathroom (3 floors ( basement and upstairs))     Bathroom Shower/Tub: Walk-in shower     Bathroom Accessibility: Yes How Accessible: Accessible via walker;Accessible via wheelchair Home Equipment: Shower seat - built in;Grab bars - toilet;Grab bars - tub/shower;Hand held shower head;Walker - 2 wheels;Wheelchair - power;Bedside commode (knee walker , jazzy chair)   Additional Comments: drives, 2 boxers ([place] and [place]). Pt currently has 4 other adults living in the home with x2 other dogs due to surgeries or recent moves to the area. Home environment is very busy but will provide 24/7 (A) at supervision level      Prior Functioning/Environment Level of Independence: Independent             OT Diagnosis: Generalized weakness;Acute pain   OT Problem List: Decreased strength;Decreased activity tolerance;Impaired balance (sitting and/or standing);Decreased safety awareness;Decreased knowledge of use of DME or AE;Decreased knowledge of precautions;Pain;Obesity   OT Treatment/Interventions: Self-care/ADL training;Therapeutic exercise;DME and/or AE instruction;Therapeutic activities;Patient/family education;Balance training    OT Goals(Current goals can be found in the care plan section) Acute Rehab OT Goals Patient Stated Goal: to go home OT Goal Formulation: With patient/family Time For Goal Achievement: 04/19/15 Potential to Achieve Goals: Good  OT Frequency: Min 2X/week   Barriers to D/C:            Co-evaluation              End of Session Equipment Utilized During Treatment: Gait belt;Rolling walker Nurse Communication: Mobility status;Precautions  Activity Tolerance: Patient tolerated treatment well Patient left:  in chair;with call bell/phone within reach;with family/visitor present   Time: 0821-0927 OT Time Calculation (min): 66 min Charges:  OT General Charges $OT Visit: 1 Procedure OT Evaluation $Initial OT Evaluation Tier I: 1 Procedure OT Treatments $Self Care/Home Management : 38-52 mins G-Codes:    Parke Poisson B 2015/04/08, 9:54 AM   Jeri Modena   OTR/L Pager: [phone] Office: [phone] .

## 2015-04-05 NOTE — Progress Notes (Signed)
Occupational Therapy Treatment Patient Details Name: [name] MRN: [id number] DOB: March 05, 1946 Today's Date: 04/05/2015    History of present illness 69 yo female s/p ORIF L ankle HWE:[id number], esophageal reflux, anxiety, depressive disorder, seizures x1, brain tumor, craniotomy, back surg 2014, carpal tunnel release R UE   OT comments  Patient making progress towards OT goals, continue plan of care for now. At this time, this therapist clinically recommends patient stay an extra night for some therapy tomorrow prior to discharge > home. Pt requires min assist for mobility, but is making progress with safety, balance, and mobility with RW. Husband expressed concerns regarding patient discharging > home today. Encouraged patient to relax LLE, pt tends to keep quad flexed and doesn't want to relax. Also, encouraged patient to position LLE on pillows to elevate to decrease swelling.    Follow Up Recommendations  Home health OT;Supervision/Assistance - 24 hour    Equipment Recommendations  None recommended by OT (Pt states she has a 3-in-1 and shower seat)    Recommendations for Other Services  None at this time   Precautions / Restrictions Precautions Precautions: Fall Restrictions Weight Bearing Restrictions: Yes LLE Weight Bearing: Non weight bearing    Mobility Bed Mobility General bed mobility comments: Pt found seated on BSC upon OT entering room and left in recliner upon OT exiting room  Transfers Overall transfer level: Needs assistance Equipment used: Rolling walker (2 wheeled) Transfers: Sit to/from [facility] Sit to Stand: Min assist Stand pivot transfers: Min assist       General transfer comment: Pt did not pick up RLE during transfer. Cues needed to maintain NWB>LLE. Cues needed for hand placement and sequencing throughout transfer.     Balance Overall balance assessment: Needs assistance Sitting-balance support: No upper extremity  supported;Single extremity supported Sitting balance-Leahy Scale: Fair     Standing balance support: Bilateral upper extremity supported;During functional activity Standing balance-Leahy Scale: Poor Standing balance comment: using rw   ADL Overall ADL's : Needs assistance/impaired   General ADL Comments: Pt found seated on BSC after husband assisted her there. Pt expressed concerns with using RW for mobility. Husband expressed concerns of patient discharging > home today. Husband states that she will have 24/7 supervision/assistance post acute d/c. Education, demonstrated, and had patient return demonstrate use of AE for LB ADLs. With use of reacher and sock aid, pt able to dress self with set-up assistance and min assist during standing to maintain dynamic standing balance when pulling pants up to waist.Encouraged patient to use shower seat in walk-in shower and BSC beside bed/chair prn and over toilet seat prn.      Cognition   Behavior During Therapy: [facility] for tasks assessed/performed;Anxious Overall Cognitive Status: Within Functional Limits for tasks assessed                Pertinent Vitals/ Pain       Pain Assessment: 0-10 Pain Score: 4  Pain Location: LLE Pain Descriptors / Indicators: Aching;Guarding Pain Intervention(s): Monitored during session;Relaxation;Repositioned  Home Living Family/patient expects to be discharged to:: Private residence Living Arrangements: Spouse/significant other;Other relatives Available Help at Discharge: Family;Available 24 hours/day Type of Home: House Home Access: Stairs to enter [employer] of Steps: 2 (2X1 step and 1X2 steps to enter home) Entrance Stairs-Rails: None Home Layout: Multi-level;Able to live on main level with bedroom/bathroom Home Equipment: [place]ford Rile - 2 wheels;Wheelchair - power (knee scooter)   Additional Comments: Husband leaving for [place] in approx. 1 week, will have  other family present 24/7 to assist as  needed.       Prior Functioning/Environment Level of Independence: Independent    Frequency Min 2X/week     Progress Toward Goals  OT Goals(current goals can now be found in the care plan section)  Progress towards OT goals: Progressing toward goals  Acute Rehab OT Goals Patient Stated Goal: to go home  Plan Discharge plan remains appropriate    End of Session Equipment Utilized During Treatment: Gait belt;Rolling walker   Activity Tolerance Patient tolerated treatment well   Patient Left in chair;with call bell/phone within reach;with family/visitor present  Nurse Communication Other (comment) (husband's concerns about patient discharging today)     Time: 4098-1191 OT Time Calculation (min): 28 min  Charges: OT General Charges $OT Visit: 1 Procedure OT Evaluation $Initial OT Evaluation Tier I: 1 Procedure OT Treatments $Self Care/Home Management : 23-37 mins  [name] , MS, OTR/L, CLT Pager: [phone]  04/05/2015, 3:11 PM

## 2015-04-05 NOTE — Progress Notes (Signed)
Physical Therapy Treatment Patient Details Name: [name] MRN: [id number] DOB: 04-23-1946 Today's Date: 04/05/2015    History of Present Illness 69 yo female s/p ORIF L ankle [id number], esophageal reflux, anxiety, depressive disorder, seizures x1, brain tumor, craniotomy, back surg 2014, carpal tunnel release R UE    PT Comments    At this time, the patient is being D/C to home with family assistance. Afternoon session focused on transfers and mobility using knee walker. This also included safe use for mobility and transfers. Patient reports that she will enter the home in her w/c so they can bump her up the steps in it. Patient and family decline review of this technique but a verbal review was performed. Home PT is reportedly coming to start session tomorrow. By the end of the session the patient and family deny any questions or concerns.   Follow Up Recommendations  Home health PT;Supervision for mobility/OOB     Equipment Recommendations  None recommended by PT    Recommendations for Other Services       Precautions / Restrictions Precautions Precautions: Fall Restrictions Weight Bearing Restrictions: Yes LLE Weight Bearing: Non weight bearing    Mobility  Bed Mobility               General bed mobility comments: found and returned to chair  Transfers Overall transfer level: Needs assistance Equipment used: Rolling walker (2 wheeled) (knee walker) Transfers: Sit to/from Stand Sit to Stand: Min assist Stand pivot transfers: Min assist       General transfer comment: consistent with NWB LLE  Ambulation/Gait Ambulation/Gait assistance: Min guard Ambulation Distance (Feet): 75 Feet Assistive device:  (knee walker)       General Gait Details: able to safely use knee walker with min assistance. Reviewed transfers with knee walker as well as safe use. Patient and family deny any questions or concerns.    Stairs Stairs:  (declined by patient and family.  )          Wheelchair Mobility    Modified Rankin (Stroke Patients Only)       Balance Overall balance assessment: Needs assistance Sitting-balance support: No upper extremity supported Sitting balance-Leahy Scale: Good     Standing balance support: Bilateral upper extremity supported Standing balance-Leahy Scale: Poor                      Cognition Arousal/Alertness: Awake/alert Behavior During Therapy: WFL for tasks assessed/performed;Anxious Overall Cognitive Status: Within Functional Limits for tasks assessed                      Exercises      General Comments General comments (skin integrity, edema, etc.): Using knee walker for mobility at this time. Patient reports having one at home. Patient and family report that they will use her wheelchair to bump her up the stairs into the home. Educated on option of posterior approach with walker but patient reports that she would prefer the w/c at this time.       Pertinent Vitals/Pain Pain Assessment: Faces Pain Score: 4  Faces Pain Scale: Hurts little more Pain Location: LLE Pain Descriptors / Indicators: Aching;Sore Pain Intervention(s): Monitored during session;Limited activity within patient's tolerance    Home Living                      Prior Function            PT Goals (  current goals can now be found in the care plan section) Acute Rehab PT Goals Patient Stated Goal: learn how to get into home. PT Goal Formulation: With patient Time For Goal Achievement: 04/19/15 Potential to Achieve Goals: Fair Progress towards PT goals: Progressing toward goals    Frequency  Min 5X/week    PT Plan Current plan remains appropriate    Co-evaluation             End of Session Equipment Utilized During Treatment: Gait belt Activity Tolerance: Patient tolerated treatment well Patient left: in chair;with call bell/phone within reach;with family/visitor present     Time:  2811-8867 PT Time Calculation (min) (ACUTE ONLY): 23 min  Charges:  $Therapeutic Activity: 23-37 mins                    G Codes:      Cassell Clement, PT, CSCS Pager [phone] Office [phone]  04/05/2015, 4:02 PM

## 2015-04-05 NOTE — Care Management Note (Addendum)
Case Management Note  Patient Details  Name: [name] MRN: [id number] Date of Birth: 16-May-1946  Subjective/Objective:              Admitted with left ankle fracture, s/p left ankle ORIF      Action/Plan: Spoke with patient about HH, she chose [facility]. Contacted Ivry Pigue with [facility] and set up [place] and [place]. They will be able to start service on 09/10. Patient stated that she has a knee walker, rolling walker and 3N1 at home. Patient stated that she will have family available to assist her at home.  Spoke with Stanton Kidney from [place], added aide visits to initially assist with ADLS.   Expected Discharge Date:                  Expected Discharge Plan:  [place]  In-House Referral:  NA  Discharge planning Services  CM Consult  Post Acute Care Choice:  Home Health Choice offered to:  Patient  DME Arranged:    DME Agency:     HH Arranged:  PT, OT HH Agency:  [facility]  Status of Service:  Completed, signed off  Medicare Important Message Given:    Date Medicare IM Given:    Medicare IM give by:    Date Additional Medicare IM Given:    Additional Medicare Important Message give by:     If discussed at [place] of Stay Meetings, dates discussed:    Additional Comments:  Nila Nephew, RN 04/05/2015, 12:52 PM

## 2015-04-05 NOTE — Progress Notes (Signed)
Subjective: 1 Day Post-Op Procedure(s) (LRB): OPEN REDUCTION INTERNAL FIXATION (ORIF) LEFT ANKLE FRACTURE (Left)  Patient reports pain as mild to moderate.  Tolerating POs well.  Denies fever, chills, N/V.  Denies BM, admits to flatulence.   Objective:   VITALS:  Temp:  [97.5 F (36.4 C)-98.7 F (37.1 C)] 98.1 F (36.7 C) (09/09 0600) Pulse Rate:  [66-85] 76 (09/09 0600) Resp:  [12-22] 18 (09/09 0600) BP: (125-143)/(47-70) 133/69 mmHg (09/09 0600) SpO2:  [95 %-99 %] 96 % (09/09 0600) FiO2 (%):  [28 %] 28 % (09/08 1559)  General: WDWN patient in NAD. Psych:  Appropriate mood and affect. Neuro:  A&O x 3, Moving all extremities, sensation intact to light touch HEENT:  EOMs intact Chest:  Even non-labored respirations Skin:  Posterior splint and dressing C/D/I Extremities: warm/dry, mild edema to toes, no erythmea or echymosis.  No lymphadenopathy. Pulses: Popliteus 2+ MSK:  ROM:  Slight movement of toes, which is her baseline due to her drop foot and previous hammertoe corrections, MMT:  Patient can perform quad set    LABS  Recent Labs  04/03/15 0315 04/03/15 1230  HGB 11.9* 11.1*  WBC 7.8 10.3  PLT 202 206    Recent Labs  04/03/15 0315 04/03/15 1230  NA 141 140  K 3.4* 4.0  CL 107 112*  CO2 26 22  BUN 13 13  CREATININE 0.78 0.65  GLUCOSE 146* 100*    Recent Labs  04/03/15 1230  INR 1.12     Assessment/Plan: 1 Day Post-Op Procedure(s) (LRB): OPEN REDUCTION INTERNAL FIXATION (ORIF) LEFT ANKLE FRACTURE (Left)  NWB L LE Up with therpay. Will await therapy recommendations for discharge. FL2 and discharge meds on chart and signed.  Mechele Claude, PA-C, [employer] Office:  [phone]

## 2015-04-08 ENCOUNTER — Telehealth: Payer: Self-pay

## 2015-04-08 DIAGNOSIS — D509 Iron deficiency anemia, unspecified: Secondary | ICD-10-CM

## 2015-04-08 NOTE — Telephone Encounter (Signed)
Pt states she is still taking her iron daily and knows to repeat labs in 3 mths.

## 2015-04-08 NOTE — Telephone Encounter (Signed)
-----   Message from Irene Shipper, MD sent at 04/08/2015 11:10 AM EDT ----- Regarding: RE: CBC She is mildly anemic. Make sure she is taking her iron every day. Repeat CBC in 3 months ----- Message -----    From: Algernon Huxley, RN    Sent: 04/08/2015  10:47 AM      To: Irene Shipper, MD Subject: FW: CBC                                        Pt due for CBC in Sept. Pt had one done in the hospital last week. When would you like pt to have another one?  ----- Message -----    From: Algernon Huxley, RN    Sent: 04/08/2015      To: Algernon Huxley, RN Subject: CBC                                            Needs CBC in Sept, order in epic.

## 2015-10-23 ENCOUNTER — Ambulatory Visit (INDEPENDENT_AMBULATORY_CARE_PROVIDER_SITE_OTHER): Payer: Medicare Other | Admitting: Internal Medicine

## 2015-10-23 ENCOUNTER — Encounter: Payer: Self-pay | Admitting: Internal Medicine

## 2015-10-23 ENCOUNTER — Other Ambulatory Visit (INDEPENDENT_AMBULATORY_CARE_PROVIDER_SITE_OTHER): Payer: Medicare Other

## 2015-10-23 VITALS — BP 116/68 | HR 72 | Ht 63.5 in | Wt 199.1 lb

## 2015-10-23 DIAGNOSIS — D509 Iron deficiency anemia, unspecified: Secondary | ICD-10-CM

## 2015-10-23 DIAGNOSIS — K529 Noninfective gastroenteritis and colitis, unspecified: Secondary | ICD-10-CM

## 2015-10-23 DIAGNOSIS — R1084 Generalized abdominal pain: Secondary | ICD-10-CM

## 2015-10-23 DIAGNOSIS — K219 Gastro-esophageal reflux disease without esophagitis: Secondary | ICD-10-CM | POA: Diagnosis not present

## 2015-10-23 LAB — CBC WITH DIFFERENTIAL/PLATELET
BASOS ABS: 0.1 10*3/uL (ref 0.0–0.1)
Basophils Relative: 0.8 % (ref 0.0–3.0)
EOS ABS: 0.5 10*3/uL (ref 0.0–0.7)
EOS PCT: 6.6 % — AB (ref 0.0–5.0)
HCT: 37.9 % (ref 36.0–46.0)
HEMOGLOBIN: 12.9 g/dL (ref 12.0–15.0)
Lymphocytes Relative: 28.1 % (ref 12.0–46.0)
Lymphs Abs: 2 10*3/uL (ref 0.7–4.0)
MCHC: 34 g/dL (ref 30.0–36.0)
MCV: 93.8 fl (ref 78.0–100.0)
MONO ABS: 0.6 10*3/uL (ref 0.1–1.0)
Monocytes Relative: 8.3 % (ref 3.0–12.0)
NEUTROS PCT: 56.2 % (ref 43.0–77.0)
Neutro Abs: 4.1 10*3/uL (ref 1.4–7.7)
Platelets: 284 10*3/uL (ref 150.0–400.0)
RBC: 4.04 Mil/uL (ref 3.87–5.11)
RDW: 13.4 % (ref 11.5–15.5)
WBC: 7.3 10*3/uL (ref 4.0–10.5)

## 2015-10-23 NOTE — Progress Notes (Signed)
HISTORY OF PRESENT ILLNESS:  [name] is a 70 y.o. female with multiple medical problems as listed below. She has been followed in this office for GERD, symptomatic hemorrhoids, lymphocytic colitis, adenomatous colon polyps, family history of colon cancer,, and iron deficiency anemia. Last seen in this office November 2015 for iron deficiency anemia. See that dictation. She subsequently underwent colonoscopy and upper endoscopy 08/02/2014. Complete colonoscopy revealed 2 diminutive colon polyps which were removed and found to be tubular adenomas. Follow-up in 5 years recommended. 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 Duodenal biopsies were unremarkable. She was placed on iron supplementation. CBC at that time revealed hemoglobin of 11.1. She was placed on iron supplementation and have follow-up CBC which has not been obtained. Chief complaint today is that of acute vomiting, diarrhea, and abdominal pain. She scheduled this appointment. The problem began 10 days ago after consuming a salad with chicken from [facility] GERD vomiting and diarrhea resolved within 1 day. Abdominal cramping has persisted since but continues to improve. This is not severe. She wanted to keep this appointment. She continues on Nexium for GERD. No active symptoms on medication. No dysphagia. She did not need antidiarrheals. She does continue on iron. No additional issues  REVIEW OF SYSTEMS:  All non-GI ROS negative except for fatigue, hearing problems, sleeping proms, shortness of breath  Past Medical History  Diagnosis Date  . Benign neoplasm of colon   . Depressive disorder, not elsewhere classified   . Anxiety state, unspecified   . Other and unspecified hyperlipidemia   . Esophageal reflux   . Obesity   . Hypothyroidism   . Hemorrhoids   . Colon polyps     hyperplastic  . Family history of colon cancer mother  . Lymphocytic  colitis   . Contact lens/glasses fitting     wears glassses or contacts  . Complications affecting other specified body systems, hypertension     No cardiologist   . Seizures ([place])     x 1  . Brain tumor ([place])   . Esophageal ring   . Fundic gland polyps of stomach, benign   . Hiatal hernia     Past Surgical History  Procedure Laterality Date  . Craniotomy    . Back surgery    . Abdominal hysterectomy    . Appendectomy    . Carpal tunnel release      right  . Brain surgery  1999    benign tumor  . Hammertoe reconstruction with weil osteotomy  07/07/2012    Procedure: HAMMERTOE RECONSTRUCTION WITH WEIL OSTEOTOMY;  Surgeon: Wylene Simmer, MD;  Location: [place];  Service: Orthopedics;  Laterality: Left;  Left Second through [place]; Left Fifth Toe Flexor Tendon Release, and Left Second through Fourth Weil Osteotomy  . Orif ankle fracture Left 04/04/2015    Procedure: OPEN REDUCTION INTERNAL FIXATION (ORIF) LEFT ANKLE FRACTURE;  Surgeon: Wylene Simmer, MD;  Location: [place];  Service: Orthopedics;  Laterality: Left;    Social History [name]  reports that she quit smoking about 5 years ago. She has never used smokeless tobacco. She reports that she drinks about 3.5 oz of alcohol per week. She reports that she does not use illicit drugs.  family history includes Bone cancer in her maternal grandmother; Breast cancer in her mother; Colon cancer in her mother; Heart attack in her father; Lung cancer in her maternal uncle.  Allergies  Allergen Reactions  . Codeine Other (See Comments)    nightmares  . Hydrocod Polst-Cpm Polst Er Itching  . Hydrocodone-Acetaminophen Itching    Has to take benadryl with to tolerate  . Sulfa Antibiotics   . Hydrocodone Itching    Has to take benadryl to tolerate med  . Sulfonamide Derivatives Rash       PHYSICAL EXAMINATION: Vital signs: BP 116/68 mmHg  Pulse 72  Ht 5' 3.5" (1.613 m)  Wt 199 lb 2 oz  (90.323 kg)  BMI 34.72 kg/m2 General: Pleasant, Well-developed, well-nourished, no acute distress HEENT: Sclerae are anicteric, conjunctiva pink. Oral mucosa intact Lungs: Clear Heart: Regular Abdomen: soft,Obese, nontender, nondistended, no obvious ascites, no peritoneal signs, normal bowel sounds. No organomegaly. Extremities: No edema. Muscle skeletal. Brace on left foot Psychiatric: alert and oriented x3. Cooperative   ASSESSMENT:  #1. Acute gastrointestinal illness manifested by short-term vomiting and diarrhea with progressively improving abdominal cramping. Most likely acute infectious gastroenteritis (self-limited) versus food poisoning. #2. History of iron deficiency anemia. Possibly secondary to mild GAVE or hiatal hernia (possibly intermittent erosions). Has been on iron. Needs follow-up #3. Personal history of adenomatous colon polyps and family history of colon cancer #4. GERD with esophageal stricture. Asymptomatic on PPI but requires PPI therapy for control   PLAN:  #1. Discussed acute illness and recommended ongoing expectant management with parameters given to contact the office #2. Continue iron #3. CBC today. We will contact the patient with results #4. Surveillance colonoscopy 5 years #5. Reflux precautions #6. Continue PPI. Lowest dose to control symptoms recommended. Discussed chronic PPI use issues   25 minutes was spent face-to-face with the patient. Greater than 50% a time issues counseling regarding her multiple GI diagnoses and recommended plans  ADDENDUM: CBC returned normal. We have notify the patient. She will continue on iron however. Periodic follow-up of blood counts with PCP

## 2015-10-23 NOTE — Patient Instructions (Signed)
Your physician has requested that you go to the basement for the following lab work before leaving today: CBC  

## 2015-12-19 ENCOUNTER — Other Ambulatory Visit: Payer: Self-pay | Admitting: Internal Medicine

## 2016-02-10 ENCOUNTER — Other Ambulatory Visit: Payer: Self-pay | Admitting: Internal Medicine

## 2016-05-12 ENCOUNTER — Ambulatory Visit: Payer: Medicare Other | Admitting: Neurology

## 2016-05-14 ENCOUNTER — Encounter: Payer: Self-pay | Admitting: Neurology

## 2016-06-09 ENCOUNTER — Ambulatory Visit (INDEPENDENT_AMBULATORY_CARE_PROVIDER_SITE_OTHER): Payer: Medicare Other | Admitting: Neurology

## 2016-06-09 ENCOUNTER — Encounter: Payer: Self-pay | Admitting: Neurology

## 2016-06-09 VITALS — BP 142/78 | HR 76 | Resp 20 | Ht 63.5 in | Wt 205.5 lb

## 2016-06-09 DIAGNOSIS — M5416 Radiculopathy, lumbar region: Secondary | ICD-10-CM | POA: Diagnosis not present

## 2016-06-09 DIAGNOSIS — H8110 Benign paroxysmal vertigo, unspecified ear: Secondary | ICD-10-CM

## 2016-06-09 DIAGNOSIS — G250 Essential tremor: Secondary | ICD-10-CM | POA: Diagnosis not present

## 2016-06-09 DIAGNOSIS — Z86011 Personal history of benign neoplasm of the brain: Secondary | ICD-10-CM | POA: Diagnosis not present

## 2016-06-09 DIAGNOSIS — R269 Unspecified abnormalities of gait and mobility: Secondary | ICD-10-CM | POA: Diagnosis not present

## 2016-06-09 NOTE — Progress Notes (Signed)
[facility]  PATIENT: [name] DOB: Aug 19, 1945  REFERRING DOCTOR OR PCP:  Rolan Lipa SOURCE: patient, notes from Dr. Pricilla Holm,   _________________________________   HISTORICAL  CHIEF COMPLAINT:  Chief Complaint  Patient presents with  . Tremors    [name] is here for eval of left hand tremor, onset about 4 mos. ago.     HISTORY OF PRESENT ILLNESS:  I had the pleasure of seeing your patient, [name], at [facility] for a neurologic consultation. She is a 70 year old woman with a left hand tremor that started about 4 months ago.   She just noted the tremor the one day about 4 months ago when she lifted a sheet of paper.   Tremor has been stable.    She denies tremor in the right hand or elsewhere.      The tremor is noted when she holds something in that hand but since she is right handed, the tremor has not affected her functioning.    She can hide the tremor just by avoiding using her left hand.   Anxiety or stress does not worsen the tremor.   Nothing really makes the tremor worse.    She has a history of a brain tumor in 1999 and she has had left leg weakness and reduced balance since that time. After her reection, she also had left arm weakness but that improved more than the leg.   She did a lot of therapy many years ago.    MRI of the brain 2009 showed no evidence of residual or recurrent tumor. There is evidence of a right frontal parietal craniectomy with right frontoparietal gliotic tissue.    She also reports positional vertigo, most noticeable when she lays down or turns over in bed.   Sometimes she has ringing in her ears.     She has hypothyroidism and is on synthroid.   She does not have any lung disease or DM.    I reviewed labwork from [place]. Her TSH was elevated at 6.45 though other thyroid function tests were normal.   Calcium was mildly elevated but other Chemistry tests were normal. CBC was normal.   Lipid profile was normal.  She denies a family history of tremor.   However, her husband's grandmother had a tremor, worse with intention.    REVIEW OF SYSTEMS: Constitutional: No fevers, chills, sweats, or change in appetite Eyes: No visual changes, double vision, eye pain Ear, nose and throat: No hearing loss, ear pain, nasal congestion, sore throat.  Occasional tinnitus and vertigo Cardiovascular: No chest pain, palpitations Respiratory: No shortness of breath at rest or with exertion.   No wheezes GastrointestinaI: No nausea, vomiting, diarrhea, abdominal pain, fecal incontinence Genitourinary: No dysuria, urinary retention or frequency.  No nocturia. Musculoskeletal: No neck pain, back pain Integumentary: No rash, pruritus, skin lesions Neurological: as above Psychiatric: Some anxiety and depression at this time.  Endocrine: No palpitations, diaphoresis, change in appetite, change in weigh or increased thirst Hematologic/Lymphatic: No anemia, purpura, petechiae. Allergic/Immunologic: No itchy/runny eyes, nasal congestion, recent allergic reactions, rashes  ALLERGIES: Allergies  Allergen Reactions  . Codeine Other (See Comments)    nightmares  . Hydrocod Polst-Cpm Polst Er Itching  . Hydrocodone-Acetaminophen Itching    Has to take benadryl with to tolerate  . Sulfa Antibiotics   . Hydrocodone Itching    Has to take benadryl to tolerate med  . Sulfonamide Derivatives Rash    HOME MEDICATIONS:  Current Outpatient Prescriptions:  .  Albuterol Sulfate 108 (90 Base) MCG/ACT AEPB, Inhale into the lungs., Disp: , Rfl:  .  Ascorbic Acid (VITAMIN C) 1000 MG tablet, Take 1,000 mg by mouth daily.  , Disp: , Rfl:  .  aspirin 81 MG tablet, Take 81 mg by mouth 2 (two) times daily., Disp: , Rfl:  .  atorvastatin (LIPITOR) 40 MG tablet, Take 1 tablets by mouth once daily., Disp: , Rfl:  .  BIOTIN PO, Take 1 tablet by mouth daily., Disp: , Rfl:  .  Calcium Carbonate-Vitamin D  600-400 MG-UNIT per tablet, Take 1 tablet by mouth 2 (two) times daily.  , Disp: , Rfl:  .  cetirizine (ZYRTEC) 10 MG tablet, Take 10 mg by mouth daily., Disp: , Rfl:  .  [employer] HYDROCORTISONE ACETATE 0.5 % cream, APPLY TO AFFECTED AREA TOPICALLY AS NEEDED FOR HEMORRHOIDS, Disp: 60 g, Rfl: 0 .  diphenoxylate-atropine (LOMOTIL) 2.5-0.025 MG tablet, Take 1 tablet by mouth as needed for diarrhea or loose stools., Disp: , Rfl:  .  ferrous sulfate 325 (65 FE) MG tablet, Take 325 mg by mouth daily with breakfast., Disp: , Rfl:  .  fluticasone (FLONASE) 50 MCG/ACT nasal spray, Place 2 sprays into the nose daily., Disp: , Rfl:  .  hydrochlorothiazide 25 MG tablet, Take 25 mg by mouth daily. , Disp: , Rfl:  .  levothyroxine (SYNTHROID, LEVOTHROID) 88 MCG tablet, Take 88 mcg by mouth daily., Disp: , Rfl:  .  nabumetone (RELAFEN) 500 MG tablet, Take 500 mg by mouth 2 (two) times daily. , Disp: , Rfl:  .  NEXIUM 40 MG capsule, TAKE 1 CAPSULE (40 MG TOTAL) BY MOUTH DAILY AT 12 NOON., Disp: 90 capsule, Rfl: 3 .  Omega-3 Fatty Acids (FISH OIL) 1000 MG CAPS, Take 1 capsule by mouth 2 (two) times daily. , Disp: , Rfl:  .  sertraline (ZOLOFT) 100 MG tablet, Take 100 mg by mouth daily., Disp: , Rfl:  .  budesonide (ENTOCORT EC) 3 MG 24 hr capsule, Take 6 mg by mouth., Disp: , Rfl:  .  Doxylamine Succinate, Sleep, (SLEEP AID PO), Take 1 tablet by mouth at bedtime., Disp: , Rfl:   PAST MEDICAL HISTORY: Past Medical History:  Diagnosis Date  . Anxiety state, unspecified   . Benign neoplasm of colon   . Brain tumor ([place])   . Colon polyps    hyperplastic  . Complications affecting other specified body systems, hypertension    No cardiologist   . Contact lens/glasses fitting    wears glassses or contacts  . Depressive disorder, not elsewhere classified   . Esophageal reflux   . Esophageal ring   . Family history of colon cancer mother  . Fundic gland polyps of stomach, benign   . Hemorrhoids   . Hiatal hernia     . Hypothyroidism   . Lymphocytic colitis   . Obesity   . Other and unspecified hyperlipidemia   . Seizures ([place])    x 1    PAST SURGICAL HISTORY: Past Surgical History:  Procedure Laterality Date  . ABDOMINAL HYSTERECTOMY    . APPENDECTOMY    . BACK SURGERY    . BRAIN SURGERY  1999   benign tumor  . CARPAL TUNNEL RELEASE     right  . CRANIOTOMY    . HAMMERTOE RECONSTRUCTION WITH WEIL OSTEOTOMY  07/07/2012   Procedure: HAMMERTOE RECONSTRUCTION WITH WEIL OSTEOTOMY;  Surgeon: Wylene Simmer, MD;  Location: [place];  Service: Orthopedics;  Laterality: Left;  Left Second through [place]; Left Fifth Toe Flexor Tendon Release, and Left Second through Fourth Weil Osteotomy  . ORIF ANKLE FRACTURE Left 04/04/2015   Procedure: OPEN REDUCTION INTERNAL FIXATION (ORIF) LEFT ANKLE FRACTURE;  Surgeon: Wylene Simmer, MD;  Location: [place];  Service: Orthopedics;  Laterality: Left;    FAMILY HISTORY: Family History  Problem Relation Age of Onset  . Breast cancer Mother   . Colon cancer Mother   . Lung cancer Maternal Uncle   . Bone cancer Maternal Grandmother   . Heart attack Father     SOCIAL HISTORY:  Social History   Social History  . Marital status: Married    Spouse name: N/A  . Number of children: 3  . Years of education: N/A   Occupational History  . retired   .  Retired   Social History Main Topics  . Smoking status: Former Smoker    Packs/day: 0.50    Years: 20.00    Quit date: 09/02/2010  . Smokeless tobacco: Never Used  . Alcohol use 3.5 oz/week    7 drink(s) per week     Comment: 1 martini daily on most days  . Drug use: No  . Sexual activity: Not on file   Other Topics Concern  . Not on file   Social History Narrative  . No narrative on file     PHYSICAL EXAM  Vitals:   06/09/16 1336  BP: (!) 142/78  Pulse: 76  Resp: 20  Weight: 205 lb 8 oz (93.2 kg)  Height: 5' 3.5" (1.613 m)    Body mass index is 35.83  kg/m.   General: The patient is well-developed and well-nourished and in no acute distress.  HEENT:   Head is normocephalic and atraumatic. Tympanic membranes appear normal but a fair amount of wax bilaterally in canals.   Pharynx is normal. Fundoscopic examination shows normal optic discs and retinal vessels..  Cardiovascular: The heart has a regular rate and rhythm with a normal S1 and S2. There were no murmurs, gallops or rubs. Lungs are clear to auscultation.  Skin: Extremities are without significant edema.  Musculoskeletal:  Back is nontender  Neurologic Exam  Mental status: The patient is alert and oriented x 3 at the time of the examination. The patient has apparent normal recent and remote memory, with an apparently normal attention span and concentration ability.   Speech is normal.  Cranial nerves: Extraocular movements are full. Pupils are equal, round, and reactive to light and accomodation.  Visual fields are full.  Facial symmetry is present. There is good facial sensation to soft touch bilaterally.Facial strength is normal.  Trapezius and sternocleidomastoid strength is normal. No dysarthria is noted.  The tongue is midline, and the patient has symmetric elevation of the soft palate. No obvious hearing deficits are noted.  Motor:  Muscle bulk is normal.   Tone is mildly increased in left leg and arm.  No cogwheeling. Strength is  5 / 5 in all 4 extremities.   Sensory: Sensory testing is intact to pinprick, soft touch and vibration sensation in arms but mild reduced vibration at left knee  Coordination: Cerebellar testing reveals good right finger-nose-finger and heel-to-shin bilaterally.  Left finger to nose is okay but left hand rapidly alternating movements are reduced. Additionally, the left heel-to-shin was reduced..  Gait and station: Station is normal.   Gait shows minimal left foot drop and slightly reduced stride. Tandem gait is  poor. Romberg is negative.    Reflexes: Deep tendon reflexes are symmetric and normal bilaterally.   Plantar responses are flexor.    DIAGNOSTIC DATA (LABS, IMAGING, TESTING) - I reviewed patient records, labs, notes, testing and imaging myself where available.  Lab Results  Component Value Date   WBC 7.3 10/23/2015   HGB 12.9 10/23/2015   HCT 37.9 10/23/2015   MCV 93.8 10/23/2015   PLT 284.0 10/23/2015      Component Value Date/Time   NA 140 04/03/2015 1230   K 4.0 04/03/2015 1230   CL 112 (H) 04/03/2015 1230   CO2 22 04/03/2015 1230   GLUCOSE 100 (H) 04/03/2015 1230   BUN 13 04/03/2015 1230   CREATININE 0.65 04/03/2015 1230   CALCIUM 8.7 (L) 04/03/2015 1230   PROT 6.8 04/20/2013 0915   ALBUMIN 3.2 (L) 04/20/2013 0915   AST 58 (H) 04/20/2013 0915   ALT 110 (H) 04/20/2013 0915   ALKPHOS 96 04/20/2013 0915   BILITOT 0.3 04/20/2013 0915   GFRNONAA >60 04/03/2015 1230   GFRAA >60 04/03/2015 1230       ASSESSMENT AND PLAN  Benign essential tremor  Benign paroxysmal positional vertigo, unspecified laterality  History of benign brain tumor  Gait disturbance  Lumbar radiculopathy   In summary, [name] a 70 year old woman with a history of a benign brain tumor resection in the past who has a left hand tremor. The characteristics of the tremor on most consistent with a very mild benign essential tremor. At affect her functioning, treatment would be optional and we will hold off at this time. However, if his tremor worsens I would consider a beta blocker and if that is not beneficial Mysoline or a benzo could also be considered. Additionally, if the tremor worsens I would want to check an MRI of the brain to make sure that there has been no tumor recurrence.   A second problem is mild benign positional vertigo and also as not to [facility] for her as it only occurs for seconds at a time. I instructed her on the Brandt-Daroff vestibular exercises and gave her a handout.   She had a fair amount of wax  in her ears and I recommended that she get wax removal drops.    She will return to see me in 4 months for a reevaluation to make sure that there has not been significant worsening of her symptoms that would require intervention. She is also to call us sooner if she notes any new or worsening neurologic symptoms.  Thank you for asking me to see [name] for a neurologic consultation. Please let me know if I can be of further assistance with her or other patients in the future.  Dontae Minerva A. Felecia Shelling, MD, PhD [id number], [id number] PM Certified in Neurology, Clinical Neurophysiology, Sleep Medicine, Pain Medicine and Neuroimaging  [facility] [address] [place], [place] [address] [phone]

## 2016-06-09 NOTE — Patient Instructions (Signed)
Clean out wax from ears with wax drops.  Do Vestibular exerises 2-3 times dail

## 2016-10-20 ENCOUNTER — Encounter: Payer: Self-pay | Admitting: Neurology

## 2016-10-20 ENCOUNTER — Ambulatory Visit (INDEPENDENT_AMBULATORY_CARE_PROVIDER_SITE_OTHER): Payer: Medicare Other | Admitting: Neurology

## 2016-10-20 VITALS — BP 134/68 | HR 68 | Resp 20 | Ht 63.5 in | Wt 202.0 lb

## 2016-10-20 DIAGNOSIS — H9313 Tinnitus, bilateral: Secondary | ICD-10-CM | POA: Diagnosis not present

## 2016-10-20 DIAGNOSIS — G250 Essential tremor: Secondary | ICD-10-CM | POA: Diagnosis not present

## 2016-10-20 DIAGNOSIS — H6123 Impacted cerumen, bilateral: Secondary | ICD-10-CM | POA: Diagnosis not present

## 2016-10-20 DIAGNOSIS — Z86011 Personal history of benign neoplasm of the brain: Secondary | ICD-10-CM | POA: Diagnosis not present

## 2016-10-20 NOTE — Patient Instructions (Signed)
Take 100 mg Vit B6 daily  Use ear wax removal drops regularly  If tinnitus not better, see ENT

## 2016-10-20 NOTE — Progress Notes (Signed)
[facility]  PATIENT: [name] DOB: [birth year]  REFERRING DOCTOR OR PCP:  Rolan Lipa SOURCE: patient, notes from Dr. Pricilla Holm,   _________________________________   HISTORICAL  CHIEF COMPLAINT:  Chief Complaint  Patient presents with  . Left Hand Tremor    Sts. left hand tremor is some worse, but not bad enought to start a med for it. Sts. ringing ears, intermiittent vertigo is about the same/fim    HISTORY OF PRESENT ILLNESS:  [name] is a 71 year old woman with a left hand tremor that started last year And tinnitus that has worsened  Her tremor started slowly in May 2017. He noted it more well she was holding light objects in the left hand. She does not note the tremor in the right hand.  Anxiety or stress does not worsen the tremor.   Nothing really makes the tremor worse.    She notes tinnitus, left = right.   It is noticeable day and night and not necessarily worse at bedtime though she notes it more, then.  She thinks it sometimes makes it a little bit harder to fall asleep. She also had positional vertigo,wors when she turned over in bed.   Sometimes she has ringing in her ears.     She notes a stuffy sensation and in her ears at times and sometimes slightly muffled hearing.  She has a history of a brain tumor in 1999 and she has had left leg weakness and reduced balance since that time. After her reection, she also had left arm weakness but that improved more than the leg.   She did a lot of therapy many years ago.    MRI of the brain 2009 showed no evidence of residual or recurrent tumor. There is evidence of a right frontal parietal craniectomy with right frontoparietal gliotic tissue.    She denies a family history of tremor.    REVIEW OF SYSTEMS: Constitutional: No fevers, chills, sweats, or change in appetite.   Eyes: No visual changes, double vision, eye pain Ear, nose and throat: No hearing loss, ear pain, nasal congestion, sore  throat.  Occasional tinnitus and vertigo Cardiovascular: No chest pain, palpitations Respiratory: No shortness of breath at rest or with exertion.   No wheezes GastrointestinaI: No nausea, vomiting, diarrhea, abdominal pain, fecal incontinence Genitourinary: No dysuria, urinary retention or frequency.  No nocturia. Musculoskeletal: No neck pain, back pain Integumentary: No rash, pruritus, skin lesions Neurological: as above Psychiatric: Some anxiety and depression at this time.  Endocrine: No palpitations, diaphoresis, change in appetite, change in weigh or increased thirst Hematologic/Lymphatic: No anemia, purpura, petechiae. Allergic/Immunologic: No itchy/runny eyes, nasal congestion, recent allergic reactions, rashes  ALLERGIES: Allergies  Allergen Reactions  . Codeine Other (See Comments)    nightmares  . Hydrocod Polst-Cpm Polst Er Itching  . Hydrocodone-Acetaminophen Itching    Has to take benadryl with to tolerate  . Sulfa Antibiotics   . Hydrocodone Itching    Has to take benadryl to tolerate med  . Sulfonamide Derivatives Rash    HOME MEDICATIONS:  Current Outpatient Prescriptions:  .  Ascorbic Acid (VITAMIN C) 1000 MG tablet, Take 1,000 mg by mouth daily.  , Disp: , Rfl:  .  aspirin 81 MG tablet, Take 81 mg by mouth 2 (two) times daily., Disp: , Rfl:  .  atorvastatin (LIPITOR) 40 MG tablet, Take 1 tablets by mouth once daily., Disp: , Rfl:  .  BIOTIN PO, Take 1 tablet by mouth daily., Disp: ,  Rfl:  .  budesonide (ENTOCORT EC) 3 MG 24 hr capsule, Take 6 mg by mouth., Disp: , Rfl:  .  Calcium Carbonate-Vitamin D 600-400 MG-UNIT per tablet, Take 1 tablet by mouth 2 (two) times daily.  , Disp: , Rfl:  .  cetirizine (ZYRTEC) 10 MG tablet, Take 10 mg by mouth daily., Disp: , Rfl:  .  CVS HYDROCORTISONE ACETATE 0.5 % cream, APPLY TO AFFECTED AREA TOPICALLY AS NEEDED FOR HEMORRHOIDS, Disp: 60 g, Rfl: 0 .  diphenoxylate-atropine (LOMOTIL) 2.5-0.025 MG tablet, Take 1 tablet  by mouth as needed for diarrhea or loose stools., Disp: , Rfl:  .  fluticasone (FLONASE) 50 MCG/ACT nasal spray, Place 2 sprays into the nose daily., Disp: , Rfl:  .  hydrochlorothiazide 25 MG tablet, Take 25 mg by mouth daily. , Disp: , Rfl:  .  levothyroxine (SYNTHROID, LEVOTHROID) 88 MCG tablet, Take 88 mcg by mouth daily., Disp: , Rfl:  .  nabumetone (RELAFEN) 500 MG tablet, Take 500 mg by mouth 2 (two) times daily. , Disp: , Rfl:  .  NEXIUM 40 MG capsule, TAKE 1 CAPSULE (40 MG TOTAL) BY MOUTH DAILY AT 12 NOON., Disp: 90 capsule, Rfl: 3 .  Omega-3 Fatty Acids (FISH OIL) 1000 MG CAPS, Take 1 capsule by mouth 2 (two) times daily. , Disp: , Rfl:  .  sertraline (ZOLOFT) 100 MG tablet, Take 100 mg by mouth daily., Disp: , Rfl:  .  Albuterol Sulfate 108 (90 Base) MCG/ACT AEPB, Inhale into the lungs., Disp: , Rfl:  .  Doxylamine Succinate, Sleep, (SLEEP AID PO), Take 1 tablet by mouth at bedtime., Disp: , Rfl:  .  ferrous sulfate 325 (65 FE) MG tablet, Take 325 mg by mouth daily with breakfast., Disp: , Rfl:   PAST MEDICAL HISTORY: Past Medical History:  Diagnosis Date  . Anxiety state, unspecified   . Benign neoplasm of colon   . Brain tumor ([place])   . Colon polyps    hyperplastic  . Complications affecting other specified body systems, hypertension    No cardiologist   . Contact lens/glasses fitting    wears glassses or contacts  . Depressive disorder, not elsewhere classified   . Esophageal reflux   . Esophageal ring   . Family history of colon cancer mother  . Fundic gland polyps of stomach, benign   . Hemorrhoids   . Hiatal hernia   . Hypothyroidism   . Lymphocytic colitis   . Obesity   . Other and unspecified hyperlipidemia   . Seizures ([place])    x 1    PAST SURGICAL HISTORY: Past Surgical History:  Procedure Laterality Date  . ABDOMINAL HYSTERECTOMY    . APPENDECTOMY    . BACK SURGERY    . BRAIN SURGERY  1999   benign tumor  . CARPAL TUNNEL RELEASE     right  .  CRANIOTOMY    . HAMMERTOE RECONSTRUCTION WITH WEIL OSTEOTOMY  07/07/2012   Procedure: HAMMERTOE RECONSTRUCTION WITH WEIL OSTEOTOMY;  Surgeon: Wylene Simmer, MD;  Location: [facility];  Service: Orthopedics;  Laterality: Left;  Left Second through [place]; Left Fifth Toe Flexor Tendon Release, and Left Second through Fourth Weil Osteotomy  . ORIF ANKLE FRACTURE Left 04/04/2015   Procedure: OPEN REDUCTION INTERNAL FIXATION (ORIF) LEFT ANKLE FRACTURE;  Surgeon: Wylene Simmer, MD;  Location: [facility];  Service: Orthopedics;  Laterality: Left;    FAMILY HISTORY: Family History  Problem Relation Age of Onset  . Breast  cancer Mother   . Colon cancer Mother   . Lung cancer Maternal Uncle   . Bone cancer Maternal Grandmother   . Heart attack Father     SOCIAL HISTORY:  Social History   Social History  . Marital status: Married    Spouse name: N/A  . Number of children: 3  . Years of education: N/A   Occupational History  . retired   .  Retired   Social History Main Topics  . Smoking status: Former Smoker    Packs/day: 0.50    Years: 20.00    Quit date: 09/02/2010  . Smokeless tobacco: Never Used  . Alcohol use 3.5 oz/week    7 drink(s) per week     Comment: 1 martini daily on most days  . Drug use: No  . Sexual activity: Not on file   Other Topics Concern  . Not on file   Social History Narrative  . No narrative on file     PHYSICAL EXAM  Vitals:   10/20/16 1308  BP: 134/68  Pulse: 68  Resp: 20  Weight: 202 lb (91.6 kg)  Height: 5' 3.5" (1.613 m)    Body mass index is 35.22 kg/m.   General: The patient is well-developed and well-nourished and in no acute distress.  HEENT:   Head is normocephalic and atraumatic. Tympanic membranes appear normal but excessive wax bilaterally in canals.   Pharynx is normal.   Neurologic Exam  Mental status: The patient is alert and oriented x 3 at the time of the examination. The patient has apparent  normal recent and remote memory, with an apparently normal attention span and concentration ability.   Speech is normal.  Cranial nerves: Extraocular movements are full.  . There is good facial sensation to soft touch bilaterally.Facial strength is normal.  Trapezius and sternocleidomastoid strength is normal. No dysarthria is noted.  The tongue is midline, and the patient has symmetric elevation of the soft palate. Mildly reduce hearing on the left but Weber is midline  Motor:  Muscle bulk is normal.   Tone is mildly increased in left leg and arm.  No cogwheeling. Strength is  5 / 5 in all 4 extremities.   Sensory: Sensory testing is intact to touch and vibration in all 4 extremities  Coordination: Cerebellar testing reveals good right finger-nose-finger and heel-to-shin bilaterally.  Left finger to nose is okay but left hand rapidly alternating movements are reduced. Additionally, the left heel-to-shin was reduced..  Gait and station: Station is normal.   Gait shows minimal left foot drop and slightly reduced stride. Tandem gait is poor. Romberg is negative.   Reflexes: Deep tendon reflexes are symmetric and normal bilaterally.   Plantar responses are flexor.    DIAGNOSTIC DATA (LABS, IMAGING, TESTING) - I reviewed patient records, labs, notes, testing and imaging myself where available.  Lab Results  Component Value Date   WBC 7.3 10/23/2015   HGB 12.9 10/23/2015   HCT 37.9 10/23/2015   MCV 93.8 10/23/2015   PLT 284.0 10/23/2015      Component Value Date/Time   NA 140 04/03/2015 1230   K 4.0 04/03/2015 1230   CL 112 (H) 04/03/2015 1230   CO2 22 04/03/2015 1230   GLUCOSE 100 (H) 04/03/2015 1230   BUN 13 04/03/2015 1230   CREATININE 0.65 04/03/2015 1230   CALCIUM 8.7 (L) 04/03/2015 1230   PROT 6.8 04/20/2013 0915   ALBUMIN 3.2 (L) 04/20/2013 0915   AST 58 (H)  04/20/2013 0915   ALT 110 (H) 04/20/2013 0915   ALKPHOS 96 04/20/2013 0915   BILITOT 0.3 04/20/2013 0915   GFRNONAA  >60 04/03/2015 1230   GFRAA >60 04/03/2015 1230       ASSESSMENT AND PLAN  Benign essential tremor  History of benign brain tumor  Tinnitus, bilateral  Excessive cerumen in both ear canals   1.   If tremor worsens consider a beta blocker or Mysoline or a benzo could also be considered. Additionally, if the tremor worsens I would want to check an MRI of the brain to make sure that there has been no tumor recurrence.    2.   For tinnitus, take 100 mg po daily of Vit B6.   Consider OTC Lipo-Flavinoid though it has never been studies in large studies.   Also advised to get Earwax drops.   If this persists after above, then see ENT 3.   She will return to see me prn any new or worsening neurologic symptoms.  Thank you for asking me to see Mrs. Fleece for a neurologic consultation. Please let me know if I can be of further assistance with her or other patients in the future.  Kellsie Grindle A. Felecia Shelling, MD, PhD 6/00/4599, 7:74 PM Certified in Neurology, Clinical Neurophysiology, Sleep Medicine, Pain Medicine and Neuroimaging  [facility] [address], [place],  [address] [phone]

## 2017-05-26 ENCOUNTER — Telehealth: Payer: Self-pay | Admitting: Internal Medicine

## 2017-05-26 NOTE — Telephone Encounter (Signed)
Pt stated she had not had a BM since Sunday. Spoke with her and she has had one now. Pt states she had been taking miralax every 3 hours. Discussed with her that now since she has had a BM she may want to take a dose of miralax daily. Pt verbalized understanding.

## 2018-02-28 ENCOUNTER — Telehealth: Payer: Self-pay | Admitting: Internal Medicine

## 2018-02-28 DIAGNOSIS — R197 Diarrhea, unspecified: Secondary | ICD-10-CM

## 2018-02-28 NOTE — Telephone Encounter (Signed)
Chart review finds last OV 09/2015 for microscopic colitis. Last phone call to office Oct 2018 for constipation.  GI pathogen panel.  If negative, I will prescribe lomotil.

## 2018-02-28 NOTE — Telephone Encounter (Signed)
[name] Pt reports she has been having explosive diarrhea since Wednesday, states she has not been on any antibiotics recently. Requesting a prescription for Lomotil. Dr. Loletha Carrow as DOD please advise.

## 2018-03-01 NOTE — Telephone Encounter (Signed)
Pt aware, order in epic. 

## 2018-03-03 ENCOUNTER — Other Ambulatory Visit: Payer: Medicare Other

## 2018-03-03 DIAGNOSIS — R197 Diarrhea, unspecified: Secondary | ICD-10-CM

## 2018-03-07 ENCOUNTER — Telehealth: Payer: Self-pay | Admitting: Internal Medicine

## 2018-03-07 LAB — GASTROINTESTINAL PATHOGEN PANEL PCR
C. DIFFICILE TOX A/B, PCR: NOT DETECTED
CAMPYLOBACTER, PCR: NOT DETECTED
CRYPTOSPORIDIUM, PCR: NOT DETECTED
E COLI 0157, PCR: NOT DETECTED
E coli (ETEC) LT/ST PCR: NOT DETECTED
E coli (STEC) stx1/stx2, PCR: NOT DETECTED
GIARDIA LAMBLIA, PCR: NOT DETECTED
Norovirus, PCR: NOT DETECTED
Rotavirus A, PCR: NOT DETECTED
Salmonella, PCR: NOT DETECTED
Shigella, PCR: NOT DETECTED

## 2018-03-07 NOTE — Telephone Encounter (Signed)
Pt called last week with explosive diarrhea and asked for Lomotil. Per Dr Loletha Carrow pt was to come and do GI pathogen panel and if negative lomotil would be prescribed. Results are not back yet and pt is calling back requesting lomotil, states she has to have some relief. Please advise.

## 2018-03-07 NOTE — Telephone Encounter (Signed)
Spoke with pt and let her know we are waiting on the GI pathogen panel result, if negative Lomotil can be sent in for pt. She has an OV scheduled with an APP.

## 2018-03-07 NOTE — Telephone Encounter (Signed)
Okay to prescribed Lomotil if GI pathogen panel negative (should be back soon, I would think). Also, if she has been having diarrhea for more than a few weeks, she needs office follow-up as she has a history of microscopic colitis

## 2018-03-08 ENCOUNTER — Other Ambulatory Visit: Payer: Self-pay

## 2018-03-08 MED ORDER — DIPHENOXYLATE-ATROPINE 2.5-0.025 MG PO TABS: 1.0000 | ORAL_TABLET | Freq: Four times a day (QID) | ORAL | 2 refills | Status: DC | PRN

## 2018-03-08 NOTE — Telephone Encounter (Signed)
GI pathogen panel negative, lomotil script sent to pharmacy and pt aware.

## 2018-03-17 ENCOUNTER — Other Ambulatory Visit (INDEPENDENT_AMBULATORY_CARE_PROVIDER_SITE_OTHER): Payer: Medicare Other

## 2018-03-17 ENCOUNTER — Encounter: Payer: Self-pay | Admitting: Physician Assistant

## 2018-03-17 ENCOUNTER — Ambulatory Visit (INDEPENDENT_AMBULATORY_CARE_PROVIDER_SITE_OTHER): Payer: Medicare Other | Admitting: Physician Assistant

## 2018-03-17 ENCOUNTER — Telehealth: Payer: Self-pay | Admitting: *Deleted

## 2018-03-17 VITALS — BP 126/70 | HR 66 | Ht 65.0 in | Wt 181.4 lb

## 2018-03-17 DIAGNOSIS — R197 Diarrhea, unspecified: Secondary | ICD-10-CM | POA: Diagnosis not present

## 2018-03-17 DIAGNOSIS — R5383 Other fatigue: Secondary | ICD-10-CM | POA: Diagnosis not present

## 2018-03-17 DIAGNOSIS — R109 Unspecified abdominal pain: Secondary | ICD-10-CM | POA: Diagnosis not present

## 2018-03-17 LAB — CBC WITH DIFFERENTIAL/PLATELET
BASOS ABS: 0.1 10*3/uL (ref 0.0–0.1)
BASOS PCT: 1 % (ref 0.0–3.0)
EOS ABS: 0.3 10*3/uL (ref 0.0–0.7)
Eosinophils Relative: 4 % (ref 0.0–5.0)
HCT: 40.3 % (ref 36.0–46.0)
Hemoglobin: 13.8 g/dL (ref 12.0–15.0)
LYMPHS ABS: 1.9 10*3/uL (ref 0.7–4.0)
Lymphocytes Relative: 25.2 % (ref 12.0–46.0)
MCHC: 34.2 g/dL (ref 30.0–36.0)
MCV: 93 fl (ref 78.0–100.0)
Monocytes Absolute: 0.7 10*3/uL (ref 0.1–1.0)
Monocytes Relative: 9.9 % (ref 3.0–12.0)
NEUTROS ABS: 4.5 10*3/uL (ref 1.4–7.7)
NEUTROS PCT: 59.9 % (ref 43.0–77.0)
PLATELETS: 249 10*3/uL (ref 150.0–400.0)
RBC: 4.33 Mil/uL (ref 3.87–5.11)
RDW: 12.9 % (ref 11.5–15.5)
WBC: 7.4 10*3/uL (ref 4.0–10.5)

## 2018-03-17 LAB — COMPREHENSIVE METABOLIC PANEL
ALT: 20 U/L (ref 0–35)
AST: 20 U/L (ref 0–37)
Albumin: 4.2 g/dL (ref 3.5–5.2)
Alkaline Phosphatase: 82 U/L (ref 39–117)
BILIRUBIN TOTAL: 0.6 mg/dL (ref 0.2–1.2)
BUN: 18 mg/dL (ref 6–23)
CO2: 30 meq/L (ref 19–32)
CREATININE: 0.7 mg/dL (ref 0.40–1.20)
Calcium: 10.9 mg/dL — ABNORMAL HIGH (ref 8.4–10.5)
Chloride: 105 mEq/L (ref 96–112)
GFR: 87.19 mL/min (ref >60.00)
GLUCOSE: 104 mg/dL — AB (ref 70–99)
Potassium: 3.1 mEq/L — ABNORMAL LOW (ref 3.5–5.1)
SODIUM: 141 meq/L (ref 135–145)
Total Protein: 6.9 g/dL (ref 6.0–8.3)

## 2018-03-17 LAB — SEDIMENTATION RATE: SED RATE: 14 mm/h (ref 0–30)

## 2018-03-17 LAB — IGA: IgA: 143 mg/dL (ref 68–378)

## 2018-03-17 LAB — HIGH SENSITIVITY CRP: CRP HIGH SENSITIVITY: 0.76 mg/L (ref 0.000–5.000)

## 2018-03-17 MED ORDER — BUDESONIDE 3 MG PO CPEP: ORAL_CAPSULE | ORAL | 4 refills | Status: DC

## 2018-03-17 MED ORDER — DIPHENOXYLATE-ATROPINE 2.5-0.025 MG PO TABS: ORAL_TABLET | ORAL | 2 refills | Status: DC

## 2018-03-17 NOTE — Telephone Encounter (Signed)
Lm for the patient.  I forgot to include on her after visit summary from todays appointment an appointment with Nicoletta Ba PA for 04-07-2018 at 9:30 am.  Amy wanted her to follow up for the diarrhea and abdominal cramping.

## 2018-03-17 NOTE — Progress Notes (Signed)
Agree with assessment and plans 

## 2018-03-17 NOTE — Progress Notes (Signed)
Subjective:    Patient ID: [name], female    DOB: 1946/02/26, 72 y.o.   MRN: [id number]  HPI [name] is a pleasant 72 year old white female known to Dr. [name] who comes in today with complaints of diarrhea. She was last seen in the office in March 2017 at that time with an acute diarrheal illness which had resolved.  The patient has history of iron deficiency anemia in the past possibly secondary to mild gave.  She also has history of adenomatous colon polyps.  Last colonoscopy January 2016 with 2 diminutive polyps removed which were tubular adenomas, and indicated for 5-year interval follow-up.  Exline EGD in January 2016 showed a distal esophageal ring fundic gland polyps, small hiatal hernia and possible gave.  Duodenal biopsies were negative. Patient has history of hypertension, anxiety/depression, TIA, family history of colon cancer, and remote history of specific colitis by review of chart. Patient states that she is been having ongoing diarrhea over the past 4 to 5 weeks At onset of symptoms she had abdominal cramping and what she describes as explosive diarrhea with several bowel movements per day eluding nocturnal episodes she did not have any associated nausea vomiting fever chills.  She feels that her stools were dark but not obvious bleeding or melena. Over the past couple of weeks with use of Lomotil 4 times daily diarrhea has decreased and is a bit more controllable she still having at least 2-3 loose to diarrheal stools each morning and then at least 2-3 other bowel movements during the day.  She complains of feeling fatigued.  Appetite is been okay. I called the office and GI pathogen panel was done which is negative. She is currently taking 1 Lomotil 4 times daily.  Prior to this episode she had been doing well, says occasionally she would have some loose stools but for the most part was having normal bowel movements. She did not take any recent antibiotics, no recent changes  in medications or supplements, no travel or other known infectious exposures. She is on Nexium chronically for GERD, no regular NSAID use  Review of Systems Pertinent positive and negative review of systems were noted in the above HPI section.  All other review of systems was otherwise negative.  Outpatient Encounter Medications as of 03/17/2018  Medication Sig  . Ascorbic Acid (VITAMIN C) 1000 MG tablet Take 1,000 mg by mouth daily.    Marland Kitchen aspirin 81 MG tablet Take 81 mg by mouth 2 (two) times daily.  Marland Kitchen atorvastatin (LIPITOR) 40 MG tablet Take 1 tablets by mouth once daily.  Marland Kitchen BIOTIN PO Take 1 tablet by mouth daily.  . Calcium Carbonate-Vitamin D 600-400 MG-UNIT per tablet Take 1 tablet by mouth 2 (two) times daily.    . cetirizine (ZYRTEC) 10 MG tablet Take 10 mg by mouth daily.  . [employer]S HYDROCORTISONE ACETATE 0.5 % cream APPLY TO AFFECTED AREA TOPICALLY AS NEEDED FOR HEMORRHOIDS  . diphenoxylate-atropine (LOMOTIL) 2.5-0.025 MG tablet Take 1 tab by mouth as needed.  . fluticasone (FLONASE) 50 MCG/ACT nasal spray Place 2 sprays into the nose daily.  . hydrochlorothiazide 25 MG tablet Take 25 mg by mouth daily.   Marland Kitchen levothyroxine (SYNTHROID, LEVOTHROID) 125 MCG tablet Take 125 mcg by mouth daily before breakfast.  . Melatonin 3 MG TABS Take by mouth at bedtime.  Marland Kitchen NEXIUM 40 MG capsule TAKE 1 CAPSULE (40 MG TOTAL) BY MOUTH DAILY AT 12 NOON.  . Omega-3 Fatty Acids (FISH OIL) 1000 MG CAPS  Take 1 capsule by mouth 2 (two) times daily.   . sertraline (ZOLOFT) 100 MG tablet Take 100 mg by mouth daily.  . [DISCONTINUED] diphenoxylate-atropine (LOMOTIL) 2.5-0.025 MG tablet Take 1 tablet by mouth 4 (four) times daily as needed for diarrhea or loose stools.  . budesonide (ENTOCORT EC) 3 MG 24 hr capsule Take 3 tablets by mouth, all at one time,  Every morning for 6 weeks, then decrease to 2 tablets daily.  . [DISCONTINUED] Albuterol Sulfate 108 (90 Base) MCG/ACT AEPB Inhale into the lungs.  . [DISCONTINUED]  budesonide (ENTOCORT EC) 3 MG 24 hr capsule Take 6 mg by mouth.  . [DISCONTINUED] diphenoxylate-atropine (LOMOTIL) 2.5-0.025 MG tablet Take 1 tablet by mouth as needed for diarrhea or loose stools.  . [DISCONTINUED] Doxylamine Succinate, Sleep, (SLEEP AID PO) Take 1 tablet by mouth at bedtime.  . [DISCONTINUED] ferrous sulfate 325 (65 FE) MG tablet Take 325 mg by mouth daily with breakfast.  . [DISCONTINUED] levothyroxine (SYNTHROID, LEVOTHROID) 88 MCG tablet Take 88 mcg by mouth daily.  . [DISCONTINUED] nabumetone (RELAFEN) 500 MG tablet Take 500 mg by mouth 2 (two) times daily.    No facility-administered encounter medications on file as of 03/17/2018.    Allergies  Allergen Reactions  . Codeine Other (See Comments)    nightmares  . Hydrocod Polst-Cpm Polst Er Itching  . Hydrocodone-Acetaminophen Itching    Has to take benadryl with to tolerate  . Sulfa Antibiotics   . Hydrocodone Itching    Has to take benadryl to tolerate med  . Sulfonamide Derivatives Rash   Patient Active Problem List   Diagnosis Date Noted  . Tinnitus, bilateral 10/20/2016  . Excessive cerumen in both ear canals 10/20/2016  . Benign essential tremor 06/09/2016  . Benign paroxysmal positional vertigo 06/09/2016  . History of benign brain tumor 06/09/2016  . Gait disturbance 06/09/2016  . Trimalleolar fracture of ankle, closed 04/03/2015  . Lumbar radiculopathy 04/19/2013  . DIARRHEA 09/15/2010  . PERSONAL HISTORY OF FAILED MODERATE SEDATION 09/15/2010  . DIARRHEA-PRESUMED INFECTIOUS 07/24/2010  . HEMORRHOIDS-INTERNAL 12/10/2008  . HEMORRHOIDS-EXTERNAL 12/10/2008  . PERSONAL HX COLONIC POLYPS 12/10/2008  . COLONIC POLYPS, HYPERPLASTIC 11/22/2008  . HYPERLIPIDEMIA 11/22/2008  . ANXIETY 11/22/2008  . DEPRESSION 11/22/2008  . ABNORMAL HEART RHYTHMS 11/22/2008  . HEMORRHOIDS 11/22/2008  . GERD 11/22/2008  . HIATAL HERNIA 11/22/2008  . HYPERTENSION NEC 11/22/2008   Social History   Socioeconomic  History  . Marital status: Married    Spouse name: Not on file  . Number of children: 3  . Years of education: Not on file  . Highest education level: Not on file  Occupational History  . Occupation: retired    Fish farm manager: RETIRED  Social Needs  . Financial resource strain: Not on file  . Food insecurity:    Worry: Not on file    Inability: Not on file  . Transportation needs:    Medical: Not on file    Non-medical: Not on file  Tobacco Use  . Smoking status: Former Smoker    Packs/day: 0.50    Years: 20.00    Pack years: 10.00    Last attempt to quit: 09/02/2010    Years since quitting: 7.5  . Smokeless tobacco: Never Used  Substance and Sexual Activity  . Alcohol use: Yes    Alcohol/week: 7.0 standard drinks    Types: 7 drink(s) per week    Comment: 1 martini daily on most days  . Drug use: No  . Sexual  activity: Not on file  Lifestyle  . Physical activity:    Days per week: Not on file    Minutes per session: Not on file  . Stress: Not on file  Relationships  . Social connections:    Talks on phone: Not on file    Gets together: Not on file    Attends religious service: Not on file    Active member of club or organization: Not on file    Attends meetings of clubs or organizations: Not on file    Relationship status: Not on file  . Intimate partner violence:    Fear of current or ex partner: Not on file    Emotionally abused: Not on file    Physically abused: Not on file    Forced sexual activity: Not on file  Other Topics Concern  . Not on file  Social History Narrative  . Not on file    Ms. Cross's family history includes Bone cancer in her maternal grandmother; Breast cancer in her mother; Colon cancer in her mother; Heart attack in her father; Lung cancer in her maternal uncle.      Objective:    Vitals:   03/17/18 1007  BP: 126/70  Pulse: 66    Physical Exam; well-developed older white female in no acute distress, accompanied by her husband,  height 5 foot 5, weight 181, BMI of 30.1.  HEENT; nontraumatic normocephalic EOMI PERRLA sclera anicteric buccal mucosa moist, neck supple, cardiovascular regular rate and rhythm with S1-S2 no murmur rub gallop, Pulmonary ;clear bilaterally, Abdomen; soft, there is no focal tenderness no guarding or rebound bowel sounds are active no palpable mass or hepatosplenomegaly, Rectal exam not done, Extremities ;no clubbing cyanosis or edema skin warm dry, Neuro psych; alert and oriented, grossly nonfocal mood and affect appropriate, she does look to her husband to help answer questions       Assessment & Plan:   #62 72 year old white female with at least 4-week history of ongoing diarrhea and some abdominal cramping.  Symptoms improved with use of Lomotil but persistent GI pathogen panel a couple of weeks ago was negative Review of chart shows remote history of lymphocytic colitis documented by random biopsies in 2012.  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  Hypertension #6.  Anxiety depression #7.  History of TIA  Plan; CBC with differential, C met, sed rate, Start Entocort 3 mg-3 tablets p.o. every morning for a total of 9 mg p.o. daily x1 month then decrease to 6 mg daily x1 month then will taper further dependent on response Continue  Lomotil 1 p.o. 4 times daily as needed Bland diet  Continue  Nexium for now as she has ongoing reflux symptoms Will plan office follow-up with Carolan Avedisian PA-C in 3 to 4 weeks.  Alayha Babineaux Genia Harold PA-C 03/17/2018   Cc: Robyne Peers, MD

## 2018-03-17 NOTE — Patient Instructions (Addendum)
Your provider has requested that you go to the basement level for lab work before leaving today. Press "B" on the elevator. The lab is located at the first door on the left as you exit the elevator.  We sent refills to your pharmacy . 1. Lomotil tablets 2. Entocort 9 mg tablets  We made you an appointment with Nicoletta Ba PA pm 04-07-2018 at 9:30 am.  If you are age 73 or older, your body mass index should be between 23-30. Your Body mass index is 30.18 kg/m. If this is out of the aforementioned range listed, please consider follow up with your Primary Care Provider.

## 2018-03-18 LAB — TISSUE TRANSGLUTAMINASE, IGG: (TTG) AB, IGG: 1 U/mL

## 2018-04-05 ENCOUNTER — Encounter: Payer: Self-pay | Admitting: Internal Medicine

## 2018-04-07 ENCOUNTER — Ambulatory Visit (INDEPENDENT_AMBULATORY_CARE_PROVIDER_SITE_OTHER): Payer: Medicare Other | Admitting: Physician Assistant

## 2018-04-07 ENCOUNTER — Encounter: Payer: Self-pay | Admitting: Physician Assistant

## 2018-04-07 VITALS — BP 142/80 | HR 82 | Ht 63.75 in | Wt 189.0 lb

## 2018-04-07 DIAGNOSIS — K219 Gastro-esophageal reflux disease without esophagitis: Secondary | ICD-10-CM | POA: Diagnosis not present

## 2018-04-07 DIAGNOSIS — K648 Other hemorrhoids: Secondary | ICD-10-CM

## 2018-04-07 DIAGNOSIS — K52832 Lymphocytic colitis: Secondary | ICD-10-CM | POA: Diagnosis not present

## 2018-04-07 NOTE — Patient Instructions (Signed)
Normal BMI (Body Mass Index- based on height and weight) is between 23 and 30. Your BMI today is Body mass index is 32.7 kg/m. Marland Kitchen Please consider follow up  regarding your BMI with your Primary Care Provider.  Decrease Entocort to 6 mg daily for 1 month, then decrease to 3 mg daily for 1 month, then stop if doing well. Please call if you have any problems or questions on this. Call [phone], choose option 2 and ask for Amy's nurse, [place].   We made your first hemorrhoidal banding appointment with Dr. Silverio Decamp for 05-23-2018  At 3:15 PM.  We will put you on a wait list for an earlier appointment for after 1010-2019.

## 2018-04-07 NOTE — Progress Notes (Signed)
Subjective:    Patient ID: [name], female    DOB: 24-Nov-1945, 72 y.o.   MRN: [id number]  HPI [name] is a pleasant 72 year old white female known to Dr. Scarlette Shorts who comes in today for follow-up after being seen about a month ago with complaints of diarrhea.  Patient has previously documented lymphocytic colitis on colon biopsies from 2012.    She was started on Entocort 9 mg p.o. daily and advised to continue Lomotil on a as needed basis.  She has also been taking Nexium for GERD. She says she feels so much better today.  She feels the Entocort is like a miracle drug for her.  She is back to having normal bowel movements 1-2 daily, no diarrhea, no complaints of abdominal pain. She does relate frequent bleeding from hemorrhoids which she says she has had "forever".  She is interested in hemorrhoidal banding.  By chart she has documented internal hemorrhoids at the time the last colonoscopy in January 2016, also with 2 diminutive polyps removed which were adenomas.  By chart she is also had previously documented external hemorrhoids.  She says she frequently passes red blood with her bowel movements and Tums sometimes will have some dripping into the commode.  Currently not complaining of any ano rectal discomfort.   Review of Systems Pertinent positive and negative review of systems were noted in the above HPI section.  All other review of systems was otherwise negative.  Outpatient Encounter Medications as of 04/07/2018  Medication Sig  . Ascorbic Acid (VITAMIN C) 1000 MG tablet Take 1,000 mg by mouth daily.    Marland Kitchen aspirin 81 MG tablet Take 81 mg by mouth 2 (two) times daily.  Marland Kitchen atorvastatin (LIPITOR) 40 MG tablet Take 1 tablets by mouth once daily.  Marland Kitchen BIOTIN PO Take 1 tablet by mouth daily.  . budesonide (ENTOCORT EC) 3 MG 24 hr capsule Take 3 tablets by mouth, all at one time,  Every morning for 6 weeks, then decrease to 2 tablets daily.  . Calcium Carbonate-Vitamin D 600-400 MG-UNIT  per tablet Take 1 tablet by mouth 2 (two) times daily.    . cetirizine (ZYRTEC) 10 MG tablet Take 10 mg by mouth daily.  . [facility] HYDROCORTISONE ACETATE 0.5 % cream APPLY TO AFFECTED AREA TOPICALLY AS NEEDED FOR HEMORRHOIDS  . diphenoxylate-atropine (LOMOTIL) 2.5-0.025 MG tablet Take 1 tab by mouth as needed.  . fluticasone (FLONASE) 50 MCG/ACT nasal spray Place 2 sprays into the nose daily.  . hydrochlorothiazide 25 MG tablet Take 25 mg by mouth daily.   Marland Kitchen levothyroxine (SYNTHROID, LEVOTHROID) 125 MCG tablet Take 125 mcg by mouth daily before breakfast.  . Melatonin 3 MG TABS Take by mouth at bedtime.  Marland Kitchen NEXIUM 40 MG capsule TAKE 1 CAPSULE (40 MG TOTAL) BY MOUTH DAILY AT 12 NOON.  . Omega-3 Fatty Acids (FISH OIL) 1000 MG CAPS Take 1 capsule by mouth 2 (two) times daily.   . sertraline (ZOLOFT) 100 MG tablet Take 100 mg by mouth daily.   No facility-administered encounter medications on file as of 04/07/2018.    Allergies  Allergen Reactions  . Codeine Other (See Comments)    nightmares  . Hydrocod Polst-Cpm Polst Er Itching  . Hydrocodone-Acetaminophen Itching    Has to take benadryl with to tolerate  . Sulfa Antibiotics   . Hydrocodone Itching    Has to take benadryl to tolerate med  . Sulfonamide Derivatives Rash   Patient Active Problem List  Diagnosis Date Noted  . Tinnitus, bilateral 10/20/2016  . Excessive cerumen in both ear canals 10/20/2016  . Benign essential tremor 06/09/2016  . Benign paroxysmal positional vertigo 06/09/2016  . History of benign brain tumor 06/09/2016  . Gait disturbance 06/09/2016  . Trimalleolar fracture of ankle, closed 04/03/2015  . Lumbar radiculopathy 04/19/2013  . DIARRHEA 09/15/2010  . PERSONAL HISTORY OF FAILED MODERATE SEDATION 09/15/2010  . DIARRHEA-PRESUMED INFECTIOUS 07/24/2010  . HEMORRHOIDS-INTERNAL 12/10/2008  . HEMORRHOIDS-EXTERNAL 12/10/2008  . PERSONAL HX COLONIC POLYPS 12/10/2008  . COLONIC POLYPS, HYPERPLASTIC 11/22/2008  .  HYPERLIPIDEMIA 11/22/2008  . ANXIETY 11/22/2008  . DEPRESSION 11/22/2008  . ABNORMAL HEART RHYTHMS 11/22/2008  . HEMORRHOIDS 11/22/2008  . GERD 11/22/2008  . HIATAL HERNIA 11/22/2008  . HYPERTENSION NEC 11/22/2008   Social History   Socioeconomic History  . Marital status: Married    Spouse name: Not on file  . Number of children: 3  . Years of education: Not on file  . Highest education level: Not on file  Occupational History  . Occupation: retired    Fish farm manager: RETIRED  Social Needs  . Financial resource strain: Not on file  . Food insecurity:    Worry: Not on file    Inability: Not on file  . Transportation needs:    Medical: Not on file    Non-medical: Not on file  Tobacco Use  . Smoking status: Former Smoker    Packs/day: 0.50    Years: 20.00    Pack years: 10.00    Last attempt to quit: 09/02/2010    Years since quitting: 7.6  . Smokeless tobacco: Never Used  Substance and Sexual Activity  . Alcohol use: Yes    Alcohol/week: 7.0 standard drinks    Types: 7 drink(s) per week    Comment: 1 martini daily on most days  . Drug use: No  . Sexual activity: Not on file  Lifestyle  . Physical activity:    Days per week: Not on file    Minutes per session: Not on file  . Stress: Not on file  Relationships  . Social connections:    Talks on phone: Not on file    Gets together: Not on file    Attends religious service: Not on file    Active member of club or organization: Not on file    Attends meetings of clubs or organizations: Not on file    Relationship status: Not on file  . Intimate partner violence:    Fear of current or ex partner: Not on file    Emotionally abused: Not on file    Physically abused: Not on file    Forced sexual activity: Not on file  Other Topics Concern  . Not on file  Social History Narrative  . Not on file    Ms. Sundt's family history includes Bone cancer in her maternal grandmother; Breast cancer in her mother; Colon cancer in  her mother; Heart attack in her father; Lung cancer in her maternal uncle.      Objective:    Vitals:   04/07/18 0931  BP: (!) 142/80  Pulse: 82    Physical Exam; well-developed elderly white female in no acute distress, pleasant blood pressure 142/80 pulse 82, BMI 32.7.  HEENT; nontraumatic normocephalic EOMI PERRLA sclera anicteric, Cardiovascular; regular rate and rhythm with S1-S2 no murmur rub or gallop, Pulmonary; clear bilaterally, Abdomen; soft, nontender nondistended bowel sounds are active no palpable mass or hepatosplenomegaly, Rectal ;exam not done.  Neuro; alert and oriented, grossly nonfocal mood and affect appropriate      Assessment & Plan:   #58 72 year old white female with lymphocytic colitis-much improved on Entocort 9 mg daily, currently asymptomatic. #2 long-term chronic intermittent hemorrhoidal bleeding with previously documented internal and external hemorrhoids.-Patient would like to have hemorrhoidal banding  #3 GERD stable #4 history of adenomatous colon polyps-up-to-date with colonoscopy due for follow-up January 2021 #5 Anxiety/depression #6 History of TIA #7 Family history of colon cancer  Plan; Continue Entocort 9 mg p.o. daily for 2 more weeks then decrease to 6 mg p.o. daily x1 month, then decrease to 3 mg daily x1 month then if continuing asymptomatic discontinue. Patient is advised to call should she have any flare in her symptoms while decreasing dose of Entocort or after she discontinues. We discussed in office hemorrhoidal banding in detail today for internal hemorrhoids.,  She wishes to pursue.  She has been scheduled for hemorrhoidal banding with Dr. Silverio Decamp  in October. Continue Nexium 40 mg p.o. Daily  Greater than 50% of visit today was spent in management of above GI issues, counseling and coordination of care.  Amy Genia Harold PA-C 04/07/2018   Cc: Robyne Peers, MD

## 2018-04-08 NOTE — Progress Notes (Signed)
Assessment and plans reviewed  

## 2018-04-26 HISTORY — PX: CATARACT EXTRACTION, BILATERAL: SHX1313

## 2018-05-23 ENCOUNTER — Encounter: Payer: Self-pay | Admitting: Gastroenterology

## 2018-05-23 ENCOUNTER — Encounter (INDEPENDENT_AMBULATORY_CARE_PROVIDER_SITE_OTHER): Payer: Self-pay

## 2018-05-23 ENCOUNTER — Encounter

## 2018-05-23 ENCOUNTER — Ambulatory Visit (INDEPENDENT_AMBULATORY_CARE_PROVIDER_SITE_OTHER): Payer: Medicare Other | Admitting: Gastroenterology

## 2018-05-23 VITALS — BP 124/70 | HR 78 | Ht 64.0 in | Wt 193.5 lb

## 2018-05-23 DIAGNOSIS — K641 Second degree hemorrhoids: Secondary | ICD-10-CM | POA: Diagnosis not present

## 2018-05-23 DIAGNOSIS — K602 Anal fissure, unspecified: Secondary | ICD-10-CM

## 2018-05-23 MED ORDER — HYDROCORTISONE ACETATE 25 MG RE SUPP: 25.0000 mg | Freq: Two times a day (BID) | RECTAL | 0 refills | Status: DC

## 2018-05-23 MED ORDER — AMBULATORY NON FORMULARY MEDICATION: 0 refills | Status: DC

## 2018-05-23 NOTE — Progress Notes (Signed)
PROCEDURE NOTE: The patient presents with symptomatic grade II  hemorrhoids, requesting rubber band ligation of his/her hemorrhoidal disease.  All risks, benefits and alternative forms of therapy were described and informed consent was obtained.  In the Left Lateral Decubitus position rectal exam felt small fissure in the left anterior position, mild discomfort and patient agreed to proceed with anoscopic examination revealed grade grade 2 hemorrhoids in the right anterior, right posterior and left lateral position(s) in addition to anal fissure.   The decision was made to not to proceed with hemorrhoidal band ligation    The following reatments were recommended: Benefiber 1 teaspoon 3 times daily with meals Rectal nitroglycerin 0.125% small pea-sized amount per rectum 3 times daily for 6 to 8 weeks Anusol suppository at bedtime as needed for 5 to 7 days  The patient will return in 2 weeks for  follow-up and possible hemorrhoidal banding banding as required.  15 minutes was spent face-to-face with the patient. Greater than 50% of the time used for counseling of anal fissure and hemorrhoids as well as treatment plan and follow-up. She had multiple questions which were answered to her satisfaction  K. Denzil Magnuson , MD [phone]

## 2018-05-23 NOTE — Patient Instructions (Addendum)
Anal Fissure, Adult An anal fissure is a small tear or crack in the skin around the opening of the butt (anus).Bleeding from the tear or crack usually stops on its own within a few minutes. The bleeding may happen every time you poop (have a bowel movement) until the tear or crack heals. Follow these instructions at home: Eating and drinking  Avoid bananas and dairy products. These foods can make it hard to poop.  Drink enough fluid to keep your pee (urine) clear or pale yellow.  Eat a lot of fruit, whole grains, and vegetables. General instructions  Keep the butt area as clean and dry as you can.  Take a warm water bath (sitz bath) as told by your doctor. Do not use soap.  Take over-the-counter and prescription medicines only as told by your doctor.  Use creams or ointments only as told by your doctor.  Keep all follow-up visits as told by your doctor. This is important. Contact a doctor if:  You have more bleeding.  You have a fever.  You have watery poop (diarrhea) that is mixed with blood.  You have pain.  You problem gets worse, not better. This information is not intended to replace advice given to you by your health care provider. Make sure you discuss any questions you have with your health care provider. Document Released: 03/11/2011 Document Revised: 12/19/2015 Document Reviewed: 10/08/2014 Elsevier Interactive Patient Education  2018 [employer].  Use Benefiber 1 teaspoon three times a day as needed  We have sent Anusol suppositories to your pharmacy, if these are not covered by your insurance you may use OTC Preparation suppositories      We have sent a prescription for nitroglycerin 0.125% gel to [facility]. You should apply a pea size amount to your rectum three times daily x 6-8 weeks.  [facility]'s information is below: Address: [address] [place], [place] [address]  [phone]  *Please DO NOT go directly from our  office to pick up this medication! Give the pharmacy 1 day to process the prescription as this is compounded at takes time to make.

## 2018-06-15 ENCOUNTER — Encounter: Payer: Self-pay | Admitting: Gastroenterology

## 2018-07-11 ENCOUNTER — Encounter: Payer: Self-pay | Admitting: Gastroenterology

## 2018-07-11 ENCOUNTER — Ambulatory Visit (INDEPENDENT_AMBULATORY_CARE_PROVIDER_SITE_OTHER): Payer: Medicare Other | Admitting: Gastroenterology

## 2018-07-11 VITALS — BP 132/66 | HR 76 | Ht 63.5 in | Wt 199.0 lb

## 2018-07-11 DIAGNOSIS — K641 Second degree hemorrhoids: Secondary | ICD-10-CM | POA: Diagnosis not present

## 2018-07-11 DIAGNOSIS — K625 Hemorrhage of anus and rectum: Secondary | ICD-10-CM | POA: Diagnosis not present

## 2018-07-11 MED ORDER — AMBULATORY NON FORMULARY MEDICATION: 1 refills | Status: DC

## 2018-07-11 NOTE — Patient Instructions (Signed)
Continue Benefiber  Hold Nitroglycerin ointment for 2 days then restart for 2-4 weeks  HEMORRHOID BANDING PROCEDURE    FOLLOW-UP CARE   1. The procedure you have had should have been relatively painless since the banding of the area involved does not have nerve endings and there is no pain sensation.  The rubber band cuts off the blood supply to the hemorrhoid and the band may fall off as soon as 48 hours after the banding (the band may occasionally be seen in the toilet bowl following a bowel movement). You may notice a temporary feeling of fullness in the rectum which should respond adequately to plain Tylenol or Motrin.  2. Following the banding, avoid strenuous exercise that evening and resume full activity the next day.  A sitz bath (soaking in a warm tub) or bidet is soothing, and can be useful for cleansing the area after bowel movements.     3. To avoid constipation, take two tablespoons of natural wheat bran, natural oat bran, flax, Benefiber or any over the counter fiber supplement and increase your water intake to 7-8 glasses daily.    4. Unless you have been prescribed anorectal medication, do not put anything inside your rectum for two weeks: No suppositories, enemas, fingers, etc.  5. Occasionally, you may have more bleeding than usual after the banding procedure.  This is often from the untreated hemorrhoids rather than the treated one.  Don't be concerned if there is a tablespoon or so of blood.  If there is more blood than this, lie flat with your bottom higher than your head and apply an ice pack to the area. If the bleeding does not stop within a half an hour or if you feel faint, call our office at [phone] or go to the emergency room.  6. Problems are not common; however, if there is a substantial amount of bleeding, severe pain, chills, fever or difficulty passing urine (very rare) or other problems, you should call us at [phone] or report to the nearest  emergency room.  7. Do not stay seated continuously for more than 2-3 hours for a day or two after the procedure.  Tighten your buttock muscles 10-15 times every two hours and take 10-15 deep breaths every 1-2 hours.  Do not spend more than a few minutes on the toilet if you cannot empty your bowel; instead re-visit the toilet at a later time.

## 2018-07-11 NOTE — Progress Notes (Signed)
PROCEDURE NOTE: The patient presents with symptomatic grade 2 hemorrhoids, requesting rubber band ligation of his/her hemorrhoidal disease.  All risks, benefits and alternative forms of therapy were described and informed consent was obtained.  In the Left Lateral Decubitus position anoscopic examination revealed grade 2 hemorrhoids in the right posterior, left lateral and right anterior position(s), healed anal fissure.  The anorectum was pre-medicated with 0.125% nitroglycerin and RectiCare The decision was made to band the right posterior internal hemorrhoid, and the [place] was used to perform band ligation without complication.  Digital anorectal examination was then performed to assure proper positioning of the band, and to adjust the banded tissue as required.  The patient was discharged home without pain or other issues.  Dietary and behavioral recommendations were given and along with follow-up instructions.     The following adjunctive treatments were recommended:  Benefiber 1 teaspoon 3 times daily Restart applying rectal nitroglycerin 0.125% small pea-sized amount per rectum 2-3 times daily after 2 days and continue for additional 2 to 4 weeks  The patient will return in 2 to 4 weeks for  follow-up and possible additional banding as required. No complications were encountered and the patient tolerated the procedure well.  Damaris Hippo , MD [phone]

## 2018-07-22 ENCOUNTER — Other Ambulatory Visit: Payer: Self-pay | Admitting: Neurosurgery

## 2018-08-08 ENCOUNTER — Encounter: Payer: Self-pay | Admitting: Gastroenterology

## 2018-08-08 ENCOUNTER — Ambulatory Visit (INDEPENDENT_AMBULATORY_CARE_PROVIDER_SITE_OTHER): Payer: Medicare Other | Admitting: Gastroenterology

## 2018-08-08 VITALS — BP 119/60 | HR 74 | Ht 63.0 in | Wt 196.0 lb

## 2018-08-08 DIAGNOSIS — K641 Second degree hemorrhoids: Secondary | ICD-10-CM

## 2018-08-08 NOTE — Progress Notes (Signed)
PROCEDURE NOTE: The patient presents with symptomatic grade II  hemorrhoids, requesting rubber band ligation of his/her hemorrhoidal disease.  All risks, benefits and alternative forms of therapy were described and informed consent was obtained.   The anorectum was pre-medicated with 0.125% Nitroglycerine and Recticare The decision was made to band the left lateral internal hemorrhoid, and the [place] was used to perform band ligation without complication.  Digital anorectal examination was then performed to assure proper positioning of the band, and to adjust the banded tissue as required.  The patient was discharged home without pain or other issues.  Dietary and behavioral recommendations were given and along with follow-up instructions.     The following adjunctive treatments were recommended: Benefiber 1 teaspoon with meals three times daily  The patient will return in 2 weeks for  follow-up and possible additional banding as required. No complications were encountered and the patient tolerated the procedure well.  Damaris Hippo , MD [phone]

## 2018-08-08 NOTE — Patient Instructions (Signed)

## 2018-08-15 NOTE — Pre-Procedure Instructions (Signed)
[facility]  08/15/2018    [id number] [place], [address] [place] [address] Phone: [phone] Fax: [phone]   Your procedure is scheduled on Tuesday, August 24, 2018  Report to [facility] at 5:30 A.M.  Call this number if you have problems the morning of surgery:  [phone]   Remember:  Do not eat or drink after midnight.    Take these medicines the morning of surgery with A SIP OF WATER: atorvastatin (LIPITOR) cetirizine (ZYRTEC) fluticasone (FLONASE)  levothyroxine (SYNTHROID, LEVOTHROID)  NEXIUM  sertraline (ZOLOFT)   Follow your surgeon's instructions on when to stop Asprin.  If no instructions were given by your surgeon then you will need to call the office to get those instructions.    7 days prior to surgery STOP taking any Aspirin (unless otherwise instructed by your surgeon), Aleve, Naproxen, Ibuprofen, Motrin, Advil, Goody's, BC's, all herbal medications, fish oil, and all vitamins.   Do not wear jewelry, make-up or nail polish.  Do not wear lotions, powders, or perfumes, or deodorant.  Do not shave 48 hours prior to surgery.  Men may shave face and neck.  Do not bring valuables to the hospital.  [facility] is not responsible for any belongings or valuables.  Contacts, dentures or bridgework may not be worn into surgery.  Leave your suitcase in the car.  After surgery it may be brought to your room.  For patients admitted to the hospital, discharge time will be determined by your treatment team.  Patients discharged the day of surgery will not be allowed to drive home.   Special instructions:    [place]- Preparing For Surgery  Before surgery, you can play an important role. Because skin is not sterile, your skin needs to be as free of germs as possible. You can reduce the number of germs on your skin by washing with CHG (chlorahexidine gluconate) Soap before surgery.  CHG  is an antiseptic cleaner which kills germs and bonds with the skin to continue killing germs even after washing.    Oral Hygiene is also important to reduce your risk of infection.  Remember - BRUSH YOUR TEETH THE MORNING OF SURGERY WITH YOUR REGULAR TOOTHPASTE  Please do not use if you have an allergy to CHG or antibacterial soaps. If your skin becomes reddened/irritated stop using the CHG.  Do not shave (including legs and underarms) for at least 48 hours prior to first CHG shower. It is OK to shave your face.  Please follow these instructions carefully.   1. Shower the NIGHT BEFORE SURGERY and the MORNING OF SURGERY with CHG.   2. If you chose to wash your hair, wash your hair first as usual with your normal shampoo.  3. After you shampoo, rinse your hair and body thoroughly to remove the shampoo.  4. Use CHG as you would any other liquid soap. You can apply CHG directly to the skin and wash gently with a scrungie or a clean washcloth.   5. Apply the CHG Soap to your body ONLY FROM THE NECK DOWN.  Do not use on open wounds or open sores. Avoid contact with your eyes, ears, mouth and genitals (private parts). Wash Face and genitals (private parts)  with your normal soap.  6. Wash thoroughly, paying special attention to the area where your surgery will be performed.  7. Thoroughly rinse your body with warm water from the neck down.  8.  DO NOT shower/wash with your normal soap after using and rinsing off the CHG Soap.  9. Pat yourself dry with a CLEAN TOWEL.  10. Wear CLEAN PAJAMAS to bed the night before surgery, wear comfortable clothes the morning of surgery  11. Place CLEAN SHEETS on your bed the night of your first shower and DO NOT SLEEP WITH PETS.  Day of Surgery:  Do not apply any deodorants/lotions.  Please wear clean clothes to the hospital/surgery center.   Remember to brush your teeth WITH YOUR REGULAR TOOTHPASTE.  Please read over the following fact sheets that you  were given. Pain Booklet, Coughing and Deep Breathing, MRSA Information and Surgical Site Infection Prevention

## 2018-08-16 ENCOUNTER — Encounter (HOSPITAL_COMMUNITY): Payer: Self-pay | Admitting: Physician Assistant

## 2018-08-16 ENCOUNTER — Encounter (HOSPITAL_COMMUNITY)
Admission: RE | Admit: 2018-08-16 | Discharge: 2018-08-16 | Disposition: A | Discharge status: Home / Self Care | Payer: Medicare Other | Source: Ambulatory Visit | Attending: Neurosurgery | Admitting: Neurosurgery

## 2018-08-16 ENCOUNTER — Encounter (HOSPITAL_COMMUNITY): Payer: Self-pay

## 2018-08-16 DIAGNOSIS — I10 Essential (primary) hypertension: Secondary | ICD-10-CM | POA: Insufficient documentation

## 2018-08-16 DIAGNOSIS — M48062 Spinal stenosis, lumbar region with neurogenic claudication: Secondary | ICD-10-CM | POA: Insufficient documentation

## 2018-08-16 DIAGNOSIS — Z01818 Encounter for other preprocedural examination: Secondary | ICD-10-CM | POA: Diagnosis not present

## 2018-08-16 DIAGNOSIS — M4326 Fusion of spine, lumbar region: Secondary | ICD-10-CM | POA: Insufficient documentation

## 2018-08-16 HISTORY — DX: Dyspnea, unspecified: R06.00

## 2018-08-16 HISTORY — DX: Other complications of anesthesia, initial encounter: T88.59XA

## 2018-08-16 HISTORY — DX: Adverse effect of unspecified anesthetic, initial encounter: T41.45XA

## 2018-08-16 HISTORY — DX: Essential (primary) hypertension: I10

## 2018-08-16 LAB — BASIC METABOLIC PANEL
Anion gap: 8 (ref 5–15)
BUN: 14 mg/dL (ref 8–23)
CO2: 26 mmol/L (ref 22–32)
Calcium: 10.9 mg/dL — ABNORMAL HIGH (ref 8.9–10.3)
Chloride: 104 mmol/L (ref 98–111)
Creatinine, Ser: 0.69 mg/dL (ref 0.44–1.00)
GFR calc Af Amer: >60 mL/min (ref >60)
GFR calc non Af Amer: >60 mL/min (ref >60)
Glucose, Bld: 113 mg/dL — ABNORMAL HIGH (ref 70–99)
Potassium: 4.3 mmol/L (ref 3.5–5.1)
Sodium: 138 mmol/L (ref 135–145)

## 2018-08-16 LAB — CBC
HCT: 42 % (ref 36.0–46.0)
Hemoglobin: 13.4 g/dL (ref 12.0–15.0)
MCH: 31.2 pg (ref 26.0–34.0)
MCHC: 31.9 g/dL (ref 30.0–36.0)
MCV: 97.9 fL (ref 80.0–100.0)
Platelets: 251 10*3/uL (ref 150–400)
RBC: 4.29 MIL/uL (ref 3.87–5.11)
RDW: 12.9 % (ref 11.5–15.5)
WBC: 8.4 10*3/uL (ref 4.0–10.5)
nRBC: 0 % (ref 0.0–0.2)

## 2018-08-16 LAB — TYPE AND SCREEN
ABO/RH(D): AB NEG
Antibody Screen: NEGATIVE

## 2018-08-16 LAB — SURGICAL PCR SCREEN
MRSA, PCR: NEGATIVE
Staphylococcus aureus: NEGATIVE

## 2018-08-16 MED ORDER — CHLORHEXIDINE GLUCONATE CLOTH 2 % EX PADS: 6.0000 | MEDICATED_PAD | Freq: Once | CUTANEOUS | Status: DC

## 2018-08-16 NOTE — Progress Notes (Signed)
PCP - Dr Kevin Fenton Cardiologist - DENIES  Chest x-ray - DENIES EKG - 08/16/18 Stress Test - DENIES ECHO - DENIES Cardiac Cath - DENIES  Sleep Study - DONE 2 WEEKS AGO   CPAP - COMING TODAY    Blood Thinner Instructions: STOP ASA Aspirin Instructions:  Anesthesia review: REVIEW EKG      Patient denies shortness of breath, fever, cough and chest pain at PAT appointment   Patient verbalized understanding of instructions that were given to them at the PAT appointment. Patient was also instructed that they will need to review over the PAT instructions again at home before surgery.

## 2018-08-17 NOTE — Progress Notes (Addendum)
Anesthesia Chart Review: I also called and spoke with patient.  Case:  [id number] Date/Time:  08/24/18 0715   Procedure:  Lumbar 2-3 Posterior lumbar interbody fusion/exploration of previous fusion (N/A ) - Lumbar 2-3 Posterior lumbar interbody fusion/exploration of previous fusion   Anesthesia type:  General   Pre-op diagnosis:  Lumbar stenosis with neurogenic claudication   Location:  [facility] OR ROOM 18 / [place] [facility]   Surgeon:  Newman Pies, MD      DISCUSSION: Patient is a 73 year old female scheduled for the above procedure.  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 For anesthesia concerns, she reported good response with Dilaudid in the past. BMI is consistent with obesity.  She has a chronically abnormal EKG (somewhat diffuse T wave inversion) which is more prominent when compared to prior tracing but over reading cardiologist did not think it was significantly changed. She denied chest pain and edema, but did report worsening SOB over the past six months. It sounds like she may be in the middle of a work-up for this as she was recently seen by pulmonologist Gwenevere Ghazi, MD with [facility]. (Records requested, but currently unavailable.). She did report what sounds like normal spirometry and an abnormal sleep study and is scheduled to start CPAP this week. (Will request Sleep Study. She was instructed to bring mask in for surgery. She believes CPAP settings will be 9-13.). She does not think that she has had an echo. She is not able to be very active due to hip pain--she "piddles" around the house mostly. No significant SOB at rest, primarily with activity. She is being treated for bronchitis currently and has been sleeping with her bed elevated a few inches to help with drainage, but otherwise reports she can typically lie flat okay.    Discussed with anesthesiologist Tamela Gammon, MD. It's not completely clear if her work-up for dyspnea is complete, so recommend touching base with her PCP Robyne Peers, MD if she feels patient is medically optimized for surgery or if she requires further evaluations or testing. Will follow-up pulmonology records. Patient advised that I would contact Dr. [name] for additional input and that she should communicate with her surgeon and PCP if bronchitis not improved prior to her surgery date. She has not had any fever or wheezing.   ADDENDUM 08/22/18 9:48 AM: I left a message for Jessice at Dr. Arnoldo Morale' office that I had not yet heard back from patient's PCP Robyne Peers, MD regarding medical clearance for surgery. I followed up with [facility] regarding sleep study and pulmonology records. Patient recently seen, so records are not signed for completion yet so staff are currently only able to fax a summary of patient's sleep study.  ADDENDUM 08/23/18 2:48 PM: Patient is going to see Robyne Peers, MD today at 4:20 PM for preoperative evaluation. 08/18/18 OSA note from Marjorie Smolder, MD and 08/17/18 pulmonology note from Gwenevere Ghazi, MD received from [facility]. Note states, "PFT is normal and CXR showed some bronchitis changes. She was was just started on Zpack." FEV1 1.79 with predicated value of 86% documented. Actual CXR report mentioned diffuse interstitial lung disease with with short-term follow-up recommended for infiltrate or possible irregular nodule. Follow-up with PCP or if symptoms persist or worsen recommended by Dr. Verdie Mosher. I called PCP staff and faxed (with confirmation) pulmonology notes and  CXR report to Dr. [name] for review since she will need as part of preoperative evaluation and follow-up CXR. (UPDATE: Patient still at PCP office as of 5:00 PM, so case has been moved to a second case. Jessica at Dr. Arnoldo Morale' office to follow-up first thing  08/24/18.)   VS: BP 138/67 Comment: rechecked   Pulse 77   Temp 36.4 C   Resp 20   Ht 5\' 3"  (1.6 m)   Wt 89.5 kg   SpO2 99%   BMI 34.97 kg/m   PROVIDERS: Robyne Peers, MD is PCP (see [place]) - Scarlette Shorts, MD is GI - Gwenevere Ghazi, MD is pulmonologist with [facility].    LABS: Preoperative labs noted. CBC WNL. Cr 0.69. Glucose 113. Calcium mildly elevated at 10.9--was also 10.9 on 03/17/18 (GI labs).   (all labs ordered are listed, but only abnormal results are displayed)  Labs Reviewed  BASIC METABOLIC PANEL - Abnormal; Notable for the following components:      Result Value   Glucose, Bld 113 (*)    Calcium 10.9 (*)    All other components within normal limits  SURGICAL PCR SCREEN  CBC  TYPE AND SCREEN    SLEEP STUDY:  08/04/18 [facility]):  Impression: 1.  Mild obstructive sleep apnea hypopnea syndrome with an elevated AHI of 7.8 (normal < 5). 2.  Hypoxemia, due to apneas and hypopneas. 3.  Normal sinus rhythm with frequent premature ventricular complexes. 4.  Periodic limb movement of sleep. 5.  Lack of REM sleep might have undermined the severity of sleep apnea. Recommendation:  1.  CPAP is the treatment of choice for obstructive sleep apnea.  Hence, a titration study is needed to ascertain the prescription CPAP pressure.   2.  Hypoxemia will resolve with CPAP.   3.  Periodic limb movements usually resolve with CPAP treatment.   4.  Weight loss can alleviate some of the obstructive events.   5.  Do not drive if drowsy or not on CPAP the night before.   IMAGES: CXR 08/17/18 [facility]): Impression:    EKG: 08/16/18: NSR. ST/T wave abnormality, consider inferior ischemia. ST/T wave abnormality, consider anterolateral ischemia. Interpreting cardiologist did not feel that tracing was significantly changed since 03/31/13. T wave inversion is more prouncouned in leas V3-6.    CV: N/A  Past Medical History:   Diagnosis Date  . Anxiety state, unspecified   . Benign neoplasm of colon   . Brain tumor ([place])   . Colon polyps    hyperplastic  . Complication of anesthesia    requests dilaudid for pain  . Complications affecting other specified body systems, hypertension    No cardiologist   . Contact lens/glasses fitting    wears glassses or contacts  . Depressive disorder, not elsewhere classified   . Dyspnea   . Esophageal reflux   . Esophageal ring   . Family history of colon cancer mother  . Fundic gland polyps of stomach, benign   . Hemorrhoids   . Hiatal hernia   . Hypertension   . Hypothyroidism   . Lymphocytic colitis   . Obesity   . Other and unspecified hyperlipidemia   . Seizures ([place])    x 1    Past Surgical History:  Procedure Laterality Date  . ABDOMINAL HYSTERECTOMY    . APPENDECTOMY    . BACK SURGERY    . BRAIN SURGERY  1999   benign tumor  .  CARPAL TUNNEL RELEASE     right  . CATARACT EXTRACTION, BILATERAL  04/2018  . CRANIOTOMY    . EYE SURGERY    . HAMMERTOE RECONSTRUCTION WITH WEIL OSTEOTOMY  07/07/2012   Procedure: HAMMERTOE RECONSTRUCTION WITH WEIL OSTEOTOMY;  Surgeon: Wylene Simmer, MD;  Location: [place];  Service: Orthopedics;  Laterality: Left;  Left Second through [place]; Left Fifth Toe Flexor Tendon Release, and Left Second through [place] Osteotomy  . ORIF ANKLE FRACTURE Left 04/04/2015   Procedure: OPEN REDUCTION INTERNAL FIXATION (ORIF) LEFT ANKLE FRACTURE;  Surgeon: Wylene Simmer, MD;  Location: [place];  Service: Orthopedics;  Laterality: Left;    MEDICATIONS: . AMBULATORY NON FORMULARY MEDICATION  . Ascorbic Acid (VITAMIN C) 1000 MG tablet  . aspirin 81 MG tablet  . atorvastatin (LIPITOR) 40 MG tablet  . calcium-vitamin D (OSCAL WITH D) 500-200 MG-UNIT tablet  . cetirizine (ZYRTEC) 10 MG tablet  . [employer] HYDROCORTISONE ACETATE 0.5 % cream  . diphenoxylate-atropine (LOMOTIL) 2.5-0.025 MG tablet  .  fluticasone (FLONASE) 50 MCG/ACT nasal spray  . hydrochlorothiazide 25 MG tablet  . levothyroxine (SYNTHROID, LEVOTHROID) 125 MCG tablet  . NEXIUM 40 MG capsule  . Omega-3 Fatty Acids (FISH OIL PO)  . sertraline (ZOLOFT) 100 MG tablet  . Wheat Dextrin (BENEFIBER PO)   No current facility-administered medications for this encounter.   Per PAT RN notes, instructed to hold ASA for 7 days prior to surgery unless otherwise instructed by surgeon.   Myra Gianotti, PA-C Surgical Short Stay/Anesthesiology [facility] Phone [phone] [facility] Phone [phone] 08/18/2018 10:59 AM

## 2018-08-23 ENCOUNTER — Ambulatory Visit (INDEPENDENT_AMBULATORY_CARE_PROVIDER_SITE_OTHER): Payer: Medicare Other | Admitting: Gastroenterology

## 2018-08-23 ENCOUNTER — Encounter: Payer: Self-pay | Admitting: Gastroenterology

## 2018-08-23 VITALS — BP 142/78 | HR 92 | Ht 63.5 in | Wt 197.4 lb

## 2018-08-23 DIAGNOSIS — K641 Second degree hemorrhoids: Secondary | ICD-10-CM

## 2018-08-23 MED ORDER — FAMOTIDINE 20 MG PO TABS: 20.0000 mg | ORAL_TABLET | Freq: Every day | ORAL | 1 refills | Status: DC

## 2018-08-23 MED ORDER — PANTOPRAZOLE SODIUM 40 MG PO TBEC: 40.0000 mg | DELAYED_RELEASE_TABLET | Freq: Every day | ORAL | 1 refills | Status: DC

## 2018-08-23 NOTE — Progress Notes (Signed)
PROCEDURE NOTE: The patient presents with symptomatic grade II  hemorrhoids, requesting rubber band ligation of his/her hemorrhoidal disease.  All risks, benefits and alternative forms of therapy were described and informed consent was obtained.   The anorectum was pre-medicated with 0.125% nitroglycerine and Recticare The decision was made to band the Right anterior internal hemorrhoid, and the [place] was used to perform band ligation without complication.  Digital anorectal examination was then performed to assure proper positioning of the band, and to adjust the banded tissue as required.  The patient was discharged home without pain or other issues.  Dietary and behavioral recommendations were given and along with follow-up instructions.     The following adjunctive treatments were recommended:  Benefiber 1 teaspoon TID Miralax 1 capful daily  The patient will return in 2-4 weeks for  follow-up and possible additional banding as required. No complications were encountered and the patient tolerated the procedure well.  Damaris Hippo , MD [phone]

## 2018-08-23 NOTE — Patient Instructions (Addendum)
HEMORRHOID BANDING PROCEDURE    FOLLOW-UP CARE   1. The procedure you have had should have been relatively painless since the banding of the area involved does not have nerve endings and there is no pain sensation.  The rubber band cuts off the blood supply to the hemorrhoid and the band may fall off as soon as 48 hours after the banding (the band may occasionally be seen in the toilet bowl following a bowel movement). You may notice a temporary feeling of fullness in the rectum which should respond adequately to plain Tylenol or Motrin.  2. Following the banding, avoid strenuous exercise that evening and resume full activity the next day.  A sitz bath (soaking in a warm tub) or bidet is soothing, and can be useful for cleansing the area after bowel movements.     3. To avoid constipation, take two tablespoons of natural wheat bran, natural oat bran, flax, Benefiber or any over the counter fiber supplement and increase your water intake to 7-8 glasses daily.    4. Unless you have been prescribed anorectal medication, do not put anything inside your rectum for two weeks: No suppositories, enemas, fingers, etc.  5. Occasionally, you may have more bleeding than usual after the banding procedure.  This is often from the untreated hemorrhoids rather than the treated one.  Don't be concerned if there is a tablespoon or so of blood.  If there is more blood than this, lie flat with your bottom higher than your head and apply an ice pack to the area. If the bleeding does not stop within a half an hour or if you feel faint, call our office at [phone] or go to the emergency room.  6. Problems are not common; however, if there is a substantial amount of bleeding, severe pain, chills, fever or difficulty passing urine (very rare) or other problems, you should call us at [phone] or report to the nearest emergency room.  7. Do not stay seated continuously for more than 2-3 hours for a day or two  after the procedure.  Tighten your buttock muscles 10-15 times every two hours and take 10-15 deep breaths every 1-2 hours.  Do not spend more than a few minutes on the toilet if you cannot empty your bowel; instead re-visit the toilet at a later time.    We will send Protonix and pepcid to your pharmacy   Take Miralax daily  Take benifiber 1 teaspoon three times a day

## 2018-08-24 ENCOUNTER — Encounter (HOSPITAL_COMMUNITY): Admission: RE | Payer: Self-pay | Source: Ambulatory Visit

## 2018-08-24 ENCOUNTER — Inpatient Hospital Stay (HOSPITAL_COMMUNITY): Admission: RE | Admit: 2018-08-24 | Payer: Medicare Other | Source: Ambulatory Visit | Admitting: Neurosurgery

## 2018-08-24 SURGERY — POSTERIOR LUMBAR FUSION 1 LEVEL
Anesthesia: General

## 2018-09-09 ENCOUNTER — Institutional Professional Consult (permissible substitution): Payer: Self-pay | Admitting: Pulmonary Disease

## 2018-09-14 ENCOUNTER — Other Ambulatory Visit: Payer: Self-pay | Admitting: Gastroenterology

## 2018-09-15 ENCOUNTER — Ambulatory Visit (INDEPENDENT_AMBULATORY_CARE_PROVIDER_SITE_OTHER): Payer: Medicare Other | Admitting: Internal Medicine

## 2018-09-15 ENCOUNTER — Telehealth: Payer: Self-pay | Admitting: Internal Medicine

## 2018-09-15 ENCOUNTER — Encounter: Payer: Self-pay | Admitting: Internal Medicine

## 2018-09-15 VITALS — BP 138/62 | HR 72 | Ht 63.0 in | Wt 200.0 lb

## 2018-09-15 DIAGNOSIS — K449 Diaphragmatic hernia without obstruction or gangrene: Secondary | ICD-10-CM | POA: Diagnosis not present

## 2018-09-15 DIAGNOSIS — J849 Interstitial pulmonary disease, unspecified: Secondary | ICD-10-CM | POA: Diagnosis not present

## 2018-09-15 DIAGNOSIS — K219 Gastro-esophageal reflux disease without esophagitis: Secondary | ICD-10-CM

## 2018-09-15 LAB — SEDIMENTATION RATE: Sed Rate: 10 mm/hr (ref 0–30)

## 2018-09-15 NOTE — Patient Instructions (Addendum)
ICD-10-CM   1. ILD (interstitial lung disease) (HCC) J84.9   2. Hiatal hernia with GERD K21.9    K44.9     Do : Serum: ESR, ACE, ANA, DS-DNA, RF, anti-CCP,  ANCA screen, MPO, PR-3, Total CK,  Aldolase,  ENP Panel ( ensure includes -> scl-70, ssA, ssB, anti-RNP, anti-JO-1,  & Hypersensitivity Pneumonitis Panel   Do Spiro and DLCO  Will get 2nd opinion On CT chest  Hold back surgery for the moment  Call sleep doc and ask why CPAP if no sleep apnea and why no oxygen if o2 dropping at night  Followup - Return next 1-2 weeks in ILD clinic or 30 min slot to see Dr  (preferred) or Dr Mannam in ILD clinic  - Further diagnostic decision of no biopsy,  Vs BAL/Envisia Vs SLB at followup  - Will need to sort ouf activity of GERD at some point during followup 

## 2018-09-15 NOTE — Telephone Encounter (Signed)
MR please advise where patient can be worked in. Thanks.

## 2018-09-15 NOTE — Progress Notes (Signed)
Subjective:    Patient ID: [name], female    DOB: [birth year], 73 y.o.   MRN: [id number]  PCP Mackenzie Peers, MD   HPI  [name] 09/15/2018  Chief Complaint  Patient presents with  . Consult    Referred by [name] at [facility] due to fibrotic lung disease.  Pt states she was diagnosed from pna and had a cxr which showed a spot on the lung. Pt then had a CT performed which showed fibrosis of the lung. Pt states she does have occ SOB that is mainly after she eats. Pt also has an occ cough.   [name] is a 73 year old female accompanied by her husband and?  Daughter versus daughter-in-law.  She has had insidious onset of shortness of breath for the last few years progressive versus stable.  Dyspnea is the even at rest.  This is complicated with associated morbid obesity.  She did not pay much attention to this.  However, most recently few to several weeks ago she had what she thought was a bronchitis episode.  This resulted in a chest x-ray.  Simultaneously she was undergoing back surgery planning with [name] in neurosurgery.  At this time primary care physician was asked for preoperative clearance.  Chest x-ray showed nodular findings and concern for pneumonia.  This resulted in a CT scan of the chest without contrast.  This showed ILD and therefore she is here.   [name] Integrated Comprehensive ILD Questionnaire  Symptoms:   SYMPTOM SCALE - ILD 09/15/2018   O2 use RA  Shortness of Breath 0 -> 5 scale with 5 being worst (score 6 If unable to do)  At rest 2  Simple tasks - showers, clothes change, eating, shaving 3  Household (dishes, doing bed, laundry) 3  Shopping 3  Walking level at own pace 3  Walking keeping up with others of same age 57  Walking up Stairs 4  Walking up [place] 6 - uanble  Total (40 - 48) Dyspnea Score 28  How bad is your cough? 1  How bad is your fatigue 3   According to the patient the husband.  Insidious onset of shortness of breath gradually  getting worse over the last 2 years.  No episodic dyspnea until recently with the respiratory infection.  The not sure when the cough started it is moderate in intensity it is getting worse with time she does clear her throat she does tickle her throat.  There is no sputum production or hemoptysis.  Occasional wheezing present     Past Medical History : Denies any asthma or COPD or heart failure rheumatoid arthritis or scleroderma or lupus or rheumatoid arthritis or Sjogren's.  She does have acid reflux disease and has hiatal hernia.  She sees [name] according to her history.  Recent diagnosis of nocturnal desaturations without sleep apnea at [name].  She has been asked to take CPAP for nocturnal desaturations.  Denies any HIV or pulmonary hypertension or diabetes.  Denies hepatitis or tuberculosis or kidney disease.  No heart disease or pleurisy   ROS: Positive for fatigue but negative for arthralgia or dysphagia or dry eyes or color change in her fingers or weight loss.  She has obesity.  She does have active acid reflux for a few decades.  Does have snoring for the last few years on CPAP therapy for the last few weeks no rash or ulcers   FAMILY HISTORY of LUNG DISEASE: No  family history of pulmonary fibrosis or any lung disease   EXPOSURE HISTORY: Did smoke between 1917 2010 total of 20 cigarettes/day.  There is also history of passive smoking but no cigar use of pipe use.  No electronic cigarette use.  No marijuana or cocaine or IV drug use   HOME and HOBBY DETAILS : [place]family [place]  Home is 74 years old she is only lived there for 2 years.  There is a leaky basement with dampness but no mildew in the house of mold.  She does have a humidifier and a CPAP but no mold in it.  She does use a steam iron but again no mold.  No Tammi Klippel was pet birds or parakeets.  She is also had a feather blanket with her for the last 5 or 10 years that  she is used intermittently.  She checks her house annually for mold and there is no mold.  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  Details not known.  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  This is because I see subpleural reticulation without honeycombing and a slight predominance with craniocaudal gradient.  She also has a small to moderate hiatal hernia.   -Prior imaging: A CT scan of the abdomen lung cuts did show signs of ILD back in 2017.  A chest x-ray in 2014 did not show any evidence of ILD   Performance  Simple office walk 185 feet x  3 laps goal with forehead probe 09/15/2018   O2 used RA  Number laps completed 3  Comments about pace Slow with cane  Resting Pulse Ox/HR 100% and 69/min  Final Pulse Ox/HR 100% and 99/min  Desaturate)d </= 88% no  Desaturated <= 3% points no  Got Tachycardic >/= 90/min yes  Symptoms at end of test Mild dyspnea  Miscellaneous comments *x       has a past medical history of Anxiety state, unspecified, Benign neoplasm of colon, Brain tumor ([place]), Colon polyps, Complication of anesthesia, Complications affecting other specified body systems, hypertension, Contact lens/glasses fitting, Depressive disorder, not elsewhere classified, Dyspnea, Esophageal reflux, Esophageal ring, Family history of colon cancer (mother), Fundic gland polyps of stomach, benign, Hemorrhoids, Hiatal hernia, Hypertension, Hypothyroidism, Lymphocytic colitis, Obesity, Other and unspecified  hyperlipidemia, and Seizures ([name]).   reports that she quit smoking about 8 years ago. She has a 10.00 pack-year smoking history. She has never used smokeless tobacco.  Past Surgical History:  Procedure Laterality Date  . ABDOMINAL HYSTERECTOMY    . APPENDECTOMY    . BACK SURGERY    . BRAIN SURGERY  1999   benign tumor  . CARPAL TUNNEL RELEASE     right  . CATARACT EXTRACTION, BILATERAL  04/2018  . CRANIOTOMY    . EYE SURGERY    . HAMMERTOE RECONSTRUCTION WITH WEIL OSTEOTOMY  07/07/2012   Procedure: HAMMERTOE RECONSTRUCTION WITH WEIL OSTEOTOMY;  Surgeon: Wylene Simmer, MD;  Location: [place];  Service: Orthopedics;  Laterality: Left;  Left Second through [place]; Left Fifth Toe Flexor Tendon Release, and Left Second through [place] Osteotomy  . ORIF ANKLE FRACTURE Left 04/04/2015   Procedure: OPEN REDUCTION INTERNAL FIXATION (ORIF) LEFT ANKLE FRACTURE;  Surgeon: Wylene Simmer, MD;  Location: [place];  Service: Orthopedics;  Laterality: Left;    Allergies  Allergen Reactions  . Codeine Other (See Comments)    nightmares  . Hydrocod Polst-Cpm Polst Er Itching  . Hydrocodone-Acetaminophen Itching and Other (See Comments)    Has to take benadryl with to tolerate  . Sulfa Antibiotics Rash  . Sulfonamide Derivatives Rash    Immunization History  Administered Date(s) Administered  . Influenza, High Dose Seasonal PF 05/15/2015, 08/06/2017, 05/09/2018  . Influenza,inj,Quad PF,6+ Mos 04/14/2013  . Pneumococcal Polysaccharide-23 10/27/2007, 04/14/2013  . Td 07/27/2001  . Tdap 05/08/2011    Family History  Problem Relation Age of Onset  . Breast cancer Mother   . Colon cancer Mother   . Lung cancer Maternal Uncle   . Bone cancer Maternal Grandmother   . Heart attack Father      Current Outpatient Medications:  .  Ascorbic Acid (VITAMIN C) 1000 MG tablet, Take 1,000 mg by mouth daily.  , Disp: , Rfl:  .  aspirin 81 MG tablet, Take 81 mg by  mouth daily. , Disp: , Rfl:  .  atorvastatin (LIPITOR) 40 MG tablet, Take 40 mg by mouth daily. , Disp: , Rfl:  .  calcium-vitamin D (OSCAL WITH D) 500-200 MG-UNIT tablet, Take 1 tablet by mouth daily. , Disp: , Rfl:  .  cetirizine (ZYRTEC) 10 MG tablet, Take 10 mg by mouth daily., Disp: , Rfl:  .  [employer] HYDROCORTISONE ACETATE 0.5 % cream, APPLY TO AFFECTED AREA TOPICALLY AS NEEDED FOR HEMORRHOIDS (Patient taking differently: Apply 1 application topically daily as needed (for hemorrhoids). ), Disp: 60 g, Rfl: 0 .  famotidine (PEPCID) 20 MG tablet, TAKE 1 TABLET BY MOUTH EVERYDAY AT BEDTIME, Disp: 30 tablet, Rfl: 1 .  fluticasone (FLONASE) 50 MCG/ACT nasal spray, Place 2 sprays into both nostrils daily. , Disp: , Rfl:  .  hydrochlorothiazide 25 MG tablet, Take 25 mg by mouth daily. , Disp: , Rfl:  .  levothyroxine (SYNTHROID, LEVOTHROID) 125 MCG tablet, Take 125 mcg by mouth daily before breakfast., Disp: , Rfl:  .  NEXIUM 40 MG capsule, TAKE 1 CAPSULE (40 MG TOTAL) BY MOUTH DAILY AT 12 NOON. (Patient taking differently: Take 40 mg by mouth daily at 12 noon. ), Disp: 90 capsule, Rfl: 3 .  Omega-3 Fatty Acids (FISH OIL PO), Take 1 capsule by mouth daily. , Disp: , Rfl:  .  pantoprazole (PROTONIX) 40 MG tablet, TAKE 1 TABLET BY MOUTH EVERY DAY, Disp: 30 tablet, Rfl: 1 .  sertraline (ZOLOFT) 100 MG tablet, Take 100 mg by mouth daily., Disp: , Rfl:  .  Wheat Dextrin (BENEFIBER PO), Take 1 each by mouth daily., Disp: , Rfl:  .  AMBULATORY NON FORMULARY MEDICATION, Medication Name: Nitroglycerin ointment 0.125% use pea sized amount per rectum three times a day (Patient not taking: Reported on 09/15/2018), Disp: 30 g, Rfl: 1 .  diphenoxylate-atropine (LOMOTIL) 2.5-0.025 MG tablet, Take 1 tab by mouth as needed. (Patient not taking: Reported on 09/15/2018), Disp: 180 tablet, Rfl: 2    Review of Systems  Constitutional: Negative for fever and unexpected weight change.  HENT: Positive for ear pain, postnasal  drip, sinus pressure, sneezing and sore throat. Negative for congestion, dental problem,  nosebleeds, rhinorrhea and trouble swallowing.   Eyes: Negative for redness and itching.  Respiratory: Positive for cough and shortness of breath. Negative for chest tightness and wheezing.   Cardiovascular: Positive for palpitations. Negative for leg swelling.  Gastrointestinal: Negative for nausea and vomiting.  Genitourinary: Negative for dysuria.  Musculoskeletal: Negative for joint swelling.  Skin: Negative for rash.  Allergic/Immunologic: Negative.  Negative for environmental allergies, food allergies and immunocompromised state.  Neurological: Negative for headaches.  Hematological: Bruises/bleeds easily.  Psychiatric/Behavioral: Positive for dysphoric mood. The patient is nervous/anxious.        Objective:   Physical Exam   Vitals:   09/15/18 0900  BP: 138/62  Pulse: 72  SpO2: 97%  Weight: 200 lb (90.7 kg)  Height: '5\' 3"'$  (1.6 m)    Estimated body mass index is 35.43 kg/m as calculated from the following:   Height as of this encounter: '5\' 3"'$  (1.6 m).   Weight as of this encounter: 200 lb (90.7 kg).   General Appearance:    Alert, cooperative, no distress, appears stated age - yes , sitting on - chair. OBESE  Head:    Normocephalic, without obvious abnormality, atraumatic  Eyes:    PERRL, conjunctiva/corneas clear,  Ears:    Normal TM's and external ear canals, both ears  Nose:   Nares normal, septum midline, mucosa normal, no drainage    or sinus tenderness. OXYGEN ON - NO @ RA  Throat:   Lips, mucosa, and tongue normal; teeth and gums normal. Cyanosis on lips - no  Neck:   Supple, symmetrical, trachea midline, no adenopathy;    thyroid:  no enlargement/tenderness/nodules; no carotid   bruit or JVD  Back:     Symmetric, no curvature, ROM normal, no CVA tenderness  Lungs:     Distress - no , Wheeze no, Barrell Chest - no, Purse lip breathing - no, Crackles - Mild basal crackles    Chest Wall:    No tenderness or deformity. Scars in chest no   Heart:    Regular rate and rhythm, S1 and S2 normal, no murmur, rub   or gallop  Breast Exam:    NOT DONE  Abdomen:     Soft, non-tender, bowel sounds active all four quadrants,    no masses, no organomegaly  Genitalia:   NOT DONE  Rectal:   NOT DONE  Extremities:   Extremities normal, atraumatic, Clubbing - no, Edema - no  Pulses:   2+ and symmetric all extremities  Skin:   Stigmata of Connective Tissue Disease - STIGMATA of CONNECTIVE TISSUE DISEASE  - Distal digital fissuring (ie, "mechanic hands") - no - Distal digital tip ulceration - no -Inflammatory arthritis or polyarticular morning joint stiffness ?60 minutes - no - Palmar telangiectasia - no - Raynaud phenomenon - no - Unexplained digital edema - no - Unexplained fixed rash on the digital extensor surfaces (Gottron's sign) - no ... - Deformities of RA - no - Scleroderma  - no - Malar Rash -  no   Lymph nodes:   Cervical, supraclavicular, and axillary nodes normal  Psychiatric:  Neurologic:   pleasant CNII-XII intact, normal strength, sensation  throughout           Assessment & Plan:     ICD-10-CM   1. ILD (interstitial lung disease) (HCC) J84.9 Sedimentation rate    Mpo/pr-3 ([place]) antibodies    Rheumatoid factor    RNP Antibodies    Sjogren's syndrome antibods(ssa + ssb)  ANA    Anti-Jo 1 antibody, IgG    Anti-DNA antibody, double-stranded    Angiotensin converting enzyme    ANCA screen with reflex titer    Aldolase    CK total and CKMB (cardiac)not at [facility]    Hypersensitivity pnuemonitis profile    Anti-scleroderma antibody    Pulmonary function test  2. Hiatal hernia with GERD K21.9    K44.9     Patient Instructions      ICD-10-CM   1. ILD (interstitial lung disease) ([place]) J84.9   2. 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  With exposure history there is a possibility that she might have hypersensitivity pneumonitis  {L:AN  Do : Serum: ESR, ACE, ANA, DS-DNA, RF, anti-CCP,  ANCA screen, MPO, PR-3, Total CK,  Aldolase,  ENP Panel ( ensure includes -> scl-70, ssA, ssB, anti-RNP, anti-JO-1,  & Hypersensitivity Pneumonitis Panel   Do Arlyce Harman and DLCO  Will get 2nd opinion On CT chest  Hold back surgery for the moment  Call sleep doc and ask why CPAP if no sleep apnea and why no oxygen if o2 dropping at night  Followup - Return next 1-2 weeks in ILD clinic or 30 min slot to see Dr Chase Caller (preferred) or Dr Vaughan Browner in ILD clinic  - Further diagnostic decision of no biopsy,  Vs BAL/Envisia Vs SLB at followup (given her desire for spine surgery and obesity she might not be the best candidate for surgical lung biopsy we can determine this at follow-up)  - Will need to sort ouf activity of GERD at some point during followup; might need pH probe study       SIGNATURE    Dr. Brand Males, M.D., F.C.C.P,  Pulmonary and Critical Care Medicine Staff Physician, [place] Director - Interstitial Lung Disease  Program  Pulmonary [place] at [place], [place], [address]  Pager: [phone], If no answer or between  15:00h - 7:00h: call [phone] Telephone: [phone]  9:52 AM 09/15/2018

## 2018-09-15 NOTE — Telephone Encounter (Signed)
Looked at both MR's and Dr. Matilde Bash schedules and saw that Dr. Vaughan Browner had openings on 2/27.  Called pt to see if 2/27 worked for her for both the PFT and OV and stated to her that the appt was going to have to be with the other pulmonologist Dr. Vaughan Browner that I had mentioned to her and pt was fine with seeing him since MR did not have any openings in the timeframe specified.  Pt's appt has been rescheduled from 3/31 to 2/27 with PFT at 11:30 followed by OV at 12pm with Dr. Vaughan Browner. Nothing further needed.

## 2018-09-16 LAB — RNP ANTIBODIES

## 2018-09-16 LAB — ANTI-JO 1 ANTIBODY, IGG: Anti JO-1: <0.2 AI (ref 0.0–0.9)

## 2018-09-21 LAB — HYPERSENSITIVITY PNUEMONITIS PROFILE
ASPERGILLUS FUMIGATUS: NEGATIVE
Faenia retivirgula: NEGATIVE
PIGEON SERUM: NEGATIVE
S. VIRIDIS: NEGATIVE
T. CANDIDUS: NEGATIVE
T. VULGARIS: NEGATIVE

## 2018-09-21 LAB — ANCA SCREEN W REFLEX TITER: ANCA Screen: NEGATIVE

## 2018-09-21 LAB — ALDOLASE: Aldolase: 4.8 U/L (ref <=8.1)

## 2018-09-21 LAB — CK TOTAL AND CKMB (NOT AT ARMC)
CK TOTAL: 113 U/L (ref 29–143)
CK, MB: 3 ng/mL (ref 0–5.0)
Relative Index: 2.7 (ref 0–4.0)

## 2018-09-21 LAB — RHEUMATOID FACTOR: Rheumatoid fact SerPl-aCnc: 15 IU/mL — ABNORMAL HIGH (ref <14)

## 2018-09-21 LAB — MPO/PR-3 (ANCA) ANTIBODIES
Myeloperoxidase Abs: <1 AI
Serine Protease 3: <1 AI

## 2018-09-21 LAB — ANTI-DNA ANTIBODY, DOUBLE-STRANDED: ds DNA Ab: <1 IU/mL

## 2018-09-21 LAB — SJOGREN'S SYNDROME ANTIBODS(SSA + SSB)
SSA (Ro) (ENA) Antibody, IgG: <1 AI
SSB (La) (ENA) Antibody, IgG: <1 AI

## 2018-09-21 LAB — ANGIOTENSIN CONVERTING ENZYME: Angiotensin-Converting Enzyme: 39 U/L (ref 9–67)

## 2018-09-21 LAB — ANTI-SCLERODERMA ANTIBODY: SCLERODERMA (SCL-70) (ENA) ANTIBODY, IGG: NEGATIVE AI

## 2018-09-21 LAB — ANA: Anti Nuclear Antibody(ANA): NEGATIVE

## 2018-09-22 ENCOUNTER — Ambulatory Visit (INDEPENDENT_AMBULATORY_CARE_PROVIDER_SITE_OTHER): Payer: Medicare Other | Admitting: Pulmonary Disease

## 2018-09-22 ENCOUNTER — Encounter: Payer: Self-pay | Admitting: Pulmonary Disease

## 2018-09-22 ENCOUNTER — Ambulatory Visit (INDEPENDENT_AMBULATORY_CARE_PROVIDER_SITE_OTHER): Payer: Medicare Other | Admitting: Internal Medicine

## 2018-09-22 VITALS — BP 126/70 | HR 65 | Ht 63.0 in | Wt 200.0 lb

## 2018-09-22 DIAGNOSIS — J849 Interstitial pulmonary disease, unspecified: Secondary | ICD-10-CM

## 2018-09-22 DIAGNOSIS — R0600 Dyspnea, unspecified: Secondary | ICD-10-CM | POA: Diagnosis not present

## 2018-09-22 DIAGNOSIS — K449 Diaphragmatic hernia without obstruction or gangrene: Secondary | ICD-10-CM | POA: Diagnosis not present

## 2018-09-22 DIAGNOSIS — K219 Gastro-esophageal reflux disease without esophagitis: Secondary | ICD-10-CM

## 2018-09-22 LAB — PULMONARY FUNCTION TEST
DL/VA % pred: 107 %
DL/VA: 4.45 ml/min/mmHg/L
DLCO COR % PRED: 91 %
DLCO UNC: 17.09 ml/min/mmHg
DLCO cor: 17.09 ml/min/mmHg
DLCO unc % pred: 91 %
FEF 25-75 Pre: 1.78 L/sec
FEF2575-%Pred-Pre: 104 %
FEV1-%Pred-Pre: 86 %
FEV1-Pre: 1.8 L
FEV1FVC-%Pred-Pre: 107 %
FEV6-%Pred-Pre: 84 %
FEV6-Pre: 2.23 L
FEV6FVC-%Pred-Pre: 105 %
FVC-%Pred-Pre: 80 %
FVC-Pre: 2.23 L
PRE FEV1/FVC RATIO: 81 %
Pre FEV6/FVC Ratio: 100 %

## 2018-09-22 LAB — POCT EXHALED NITRIC OXIDE: FeNO level (ppb): 40

## 2018-09-22 MED ORDER — BUDESONIDE-FORMOTEROL FUMARATE 160-4.5 MCG/ACT IN AERO: 2.0000 | INHALATION_SPRAY | Freq: Two times a day (BID) | RESPIRATORY_TRACT | 6 refills | Status: DC

## 2018-09-22 NOTE — Patient Instructions (Addendum)
I have reviewed your lung function test which shows normal lung function Your labs are normal as well At this point we do not have a clear idea why you have interstitial lung disease  I will discuss with Dr. Chase Caller about scheduling you for a bronchoscope for further evaluation We will start you on Symbicort 160 We will also refer you to pulmonary rehab.

## 2018-09-22 NOTE — Progress Notes (Signed)
[name]    [id number]    1946-06-14  Primary Care Physician:Aguiar, Wynonia Musty, MD  Referring Physician: Robyne Peers, MD [address] [place], [place] [address]  Chief complaint: Follow-up for ILD  HPI: 73 year old undergoing work-up for interstitial lung disease.  She had a chest x-ray which showed pneumonia and a follow-up CT scan which showed pulmonary fibrosis and indeterminate/probable UIP pattern.  She had ILD serologies and PFTs checked and is here for review of tests.  Complains of chronic dyspnea on exertion.  She feels her symptoms at rest associated with wheeze.  No cough, sputum production.  Has significant issues with GERD and is on Protonix which controls symptoms.  She has history of obstructive sleep apnea and is followed at [facility].  Recently underwent a titration study and started on CPAP 9-13 cm of water.  Noted to have desats on initial sleep study which resolved on CPAP titration  Pets: Has a dog Occupation: Used to work for [employer]: Used to live in house with mold in the basement.  Moved out of it 2 years ago.  No current ongoing exposures.  She has a down comforter at home but hardly uses it Smoking history: 30-pack-year smoker.  Quit in 2010 Travel history: No significant travel history Relevant family history: No significant family history of lung disease.  Outpatient Encounter Medications as of 09/22/2018  Medication Sig  . AMBULATORY NON FORMULARY MEDICATION Medication Name: Nitroglycerin ointment 0.125% use pea sized amount per rectum three times a day  . Ascorbic Acid (VITAMIN C) 1000 MG tablet Take 1,000 mg by mouth daily.    Marland Kitchen aspirin 81 MG tablet Take 81 mg by mouth daily.   Marland Kitchen atorvastatin (LIPITOR) 40 MG tablet Take 40 mg by mouth daily.   . calcium-vitamin D (OSCAL WITH D) 500-200 MG-UNIT tablet Take 1 tablet by mouth daily.   . cetirizine (ZYRTEC) 10 MG tablet Take 10 mg by mouth daily.  .  CVS HYDROCORTISONE ACETATE 0.5 % cream APPLY TO AFFECTED AREA TOPICALLY AS NEEDED FOR HEMORRHOIDS (Patient taking differently: Apply 1 application topically daily as needed (for hemorrhoids). )  . diphenoxylate-atropine (LOMOTIL) 2.5-0.025 MG tablet Take 1 tab by mouth as needed.  . famotidine (PEPCID) 20 MG tablet TAKE 1 TABLET BY MOUTH EVERYDAY AT BEDTIME  . fluticasone (FLONASE) 50 MCG/ACT nasal spray Place 2 sprays into both nostrils daily.   . hydrochlorothiazide 25 MG tablet Take 25 mg by mouth daily.   Marland Kitchen levothyroxine (SYNTHROID, LEVOTHROID) 125 MCG tablet Take 125 mcg by mouth daily before breakfast.  . Omega-3 Fatty Acids (FISH OIL PO) Take 1 capsule by mouth daily.   . pantoprazole (PROTONIX) 40 MG tablet TAKE 1 TABLET BY MOUTH EVERY DAY  . sertraline (ZOLOFT) 100 MG tablet Take 100 mg by mouth daily.  . Wheat Dextrin (BENEFIBER PO) Take 1 each by mouth daily.  . [DISCONTINUED] NEXIUM 40 MG capsule TAKE 1 CAPSULE (40 MG TOTAL) BY MOUTH DAILY AT 12 NOON. (Patient taking differently: Take 40 mg by mouth daily at 12 noon. )   No facility-administered encounter medications on file as of 09/22/2018.     Allergies as of 09/22/2018 - Review Complete 09/22/2018  Allergen Reaction Noted  . Codeine Other (See Comments) 07/07/2012  . Hydrocod polst-cpm polst er Itching 06/25/2014  . Hydrocodone-acetaminophen Itching and Other (See Comments) 03/31/2013  . Sulfa antibiotics Rash 06/25/2014  . Sulfonamide derivatives Rash  12/10/2008    Past Medical History:  Diagnosis Date  . Anxiety state, unspecified   . Benign neoplasm of colon   . Brain tumor ([place])   . Colon polyps    hyperplastic  . Complication of anesthesia    requests dilaudid for pain  . Complications affecting other specified body systems, hypertension    No cardiologist   . Contact lens/glasses fitting    wears glassses or contacts  . Depressive disorder, not elsewhere classified   . Dyspnea   . Esophageal reflux   .  Esophageal ring   . Family history of colon cancer mother  . Fundic gland polyps of stomach, benign   . Hemorrhoids   . Hiatal hernia   . Hypertension   . Hypothyroidism   . Lymphocytic colitis   . Obesity   . Other and unspecified hyperlipidemia   . Seizures ([place])    x 1    Past Surgical History:  Procedure Laterality Date  . ABDOMINAL HYSTERECTOMY    . APPENDECTOMY    . BACK SURGERY    . BRAIN SURGERY  1999   benign tumor  . CARPAL TUNNEL RELEASE     right  . CATARACT EXTRACTION, BILATERAL  04/2018  . CRANIOTOMY    . EYE SURGERY    . HAMMERTOE RECONSTRUCTION WITH WEIL OSTEOTOMY  07/07/2012   Procedure: HAMMERTOE RECONSTRUCTION WITH WEIL OSTEOTOMY;  Surgeon: Wylene Simmer, MD;  Location: [place];  Service: Orthopedics;  Laterality: Left;  Left Second through [place]; Left Fifth Toe Flexor Tendon Release, and Left Second through [place] Osteotomy  . ORIF ANKLE FRACTURE Left 04/04/2015   Procedure: OPEN REDUCTION INTERNAL FIXATION (ORIF) LEFT ANKLE FRACTURE;  Surgeon: Wylene Simmer, MD;  Location: [place];  Service: Orthopedics;  Laterality: Left;    Family History  Problem Relation Age of Onset  . Breast cancer Mother   . Colon cancer Mother   . Lung cancer Maternal Uncle   . Bone cancer Maternal Grandmother   . Heart attack Father     Social History   Socioeconomic History  . Marital status: Married    Spouse name: Not on file  . Number of children: 3  . Years of education: Not on file  . Highest education level: Not on file  Occupational History  . Occupation: retired    Fish farm manager: RETIRED  Social Needs  . Financial resource strain: Not on file  . Food insecurity:    Worry: Not on file    Inability: Not on file  . Transportation needs:    Medical: Not on file    Non-medical: Not on file  Tobacco Use  . Smoking status: Former Smoker    Packs/day: 0.50    Years: 20.00    Pack years: 10.00    Last attempt to quit:  09/02/2010    Years since quitting: 8.0  . Smokeless tobacco: Never Used  Substance and Sexual Activity  . Alcohol use: Yes    Alcohol/week: 7.0 standard drinks    Types: 7 drink(s) per week    Comment: 1 martini daily on most days  . Drug use: No  . Sexual activity: Not on file  Lifestyle  . Physical activity:    Days per week: Not on file    Minutes per session: Not on file  . Stress: Not on file  Relationships  . Social connections:    Talks on phone: Not on file    Gets together:  Not on file    Attends religious service: Not on file    Active member of club or organization: Not on file    Attends meetings of clubs or organizations: Not on file    Relationship status: Not on file  . Intimate partner violence:    Fear of current or ex partner: Not on file    Emotionally abused: Not on file    Physically abused: Not on file    Forced sexual activity: Not on file  Other Topics Concern  . Not on file  Social History Narrative  . Not on file    Review of systems: Review of Systems  Constitutional: Negative for fever and chills.  HENT: Negative.   Eyes: Negative for blurred vision.  Respiratory: as per HPI  Cardiovascular: Negative for chest pain and palpitations.  Gastrointestinal: Negative for vomiting, diarrhea, blood per rectum. Genitourinary: Negative for dysuria, urgency, frequency and hematuria.  Musculoskeletal: Negative for myalgias, back pain and joint pain.  Skin: Negative for itching and rash.  Neurological: Negative for dizziness, tremors, focal weakness, seizures and loss of consciousness.  Endo/Heme/Allergies: Negative for environmental allergies.  Psychiatric/Behavioral: Negative for depression, suicidal ideas and hallucinations.  All other systems reviewed and are negative.  Physical Exam: Blood pressure 126/70, pulse 65, height 5\' 3"  (1.6 m), weight 200 lb (90.7 kg), SpO2 96 %. Gen:      No acute distress HEENT:  EOMI, sclera anicteric Neck:     No  masses; no thyromegaly Lungs:    Clear to auscultation bilaterally; normal respiratory effort CV:         Regular rate and rhythm; no murmurs Abd:      + bowel sounds; soft, non-tender; no palpable masses, no distension Ext:    No edema; adequate peripheral perfusion Skin:      Warm and dry; no rash Neuro: alert and oriented x 3 Psych: normal mood and affect  Data Reviewed: Imaging: CT abdomen pelvis 11/07/2015- minimal atelectasis at bilateral lung bases CT abdomen pelvis 12/03/2015- hiatal hernia, atelectasis at the lung bases.   CT chest 09/02/2018 bilateral subpleural reticulation with lower lung predominance.  No traction bronchiectasis or honeycombing.  PFTs: 09/22/2018 FVC 2.23 [80%), FEV1 1.80 [6%], F/F 81, DLCO 17.09 91% Normal test.  FENO 09/22/2018- 40  Labs: ILD serologies 09/22/2018-all negative except for rheumatoid factor of 15 CBC 03/17/2018-WBC 7.4, eos 4%, absolute eosinophil count 296  Sleep PSG 08/04/2026 Mild OSA with AHI 7.8, desaturations to 86%.  CPAP titration 08/16/2018 Titrated to auto CPAP 9-13 cm of water.  Hypoxemia resolved with high CPAP pressures Periodic limb movements persistent with CPAP therapy.  fAssessment:  Pulmonary fibrosis Unclear etiology.  Could be hypersensitivity pneumonitis given exposure to mold and down feathers.  May also have ongoing aspiration from hiatal hernia and GERD.  IPF is on the differential as well  Serologies reviewed which are negative except for mild elevation in rheumatoid factor which is nonspecific. PFTs are normal  Discussed next steps with the patient.  She may be at a high risk for surgical lung biopsy given age and obesity. She has agreed to proceed with bronchoscope with BAL and envisia classifier.  She is aware that we may not get an answer even with this procedure. Refer to pulm rehab  Asthma Likely has asthma given symptoms of wheezing and elevated FENO though she has no obstruction on PFTs.  PFTs however were  not done with albuterol bronchodilator challenge  We will give her a  Symbicort to see if it improves her symptoms.  OSA, possible restless leg syndrome Started on CPAP at [facility] May need eval for RLS and overnight oximetry on CPAP to make sure there are no residual desats Follow-up at [facility].  More then 1/2 the time of the 40 min visit was spent in counseling and/or coordination of care with the patient and family.  Plan/Recommendations: - Discuss with Dr. Chase Caller about scheduling bronchoscope - Pulmonary rehab - Start Symbicort 160 - OSA follow-up at [facility].  Marshell Garfinkel MD [facility] 09/22/2018, 12:25 PM  CC: Robyne Peers, MD

## 2018-09-22 NOTE — Progress Notes (Signed)
PFT completed today.  

## 2018-09-23 ENCOUNTER — Telehealth: Payer: Self-pay | Admitting: Pulmonary Disease

## 2018-09-23 MED ORDER — BUDESONIDE-FORMOTEROL FUMARATE 160-4.5 MCG/ACT IN AERO: 2.0000 | INHALATION_SPRAY | Freq: Two times a day (BID) | RESPIRATORY_TRACT | 0 refills | Status: DC

## 2018-09-23 NOTE — Telephone Encounter (Signed)
Pt stated symbicort was $369 a month.  Pt is calling insurance to see what is covered.  Will follow up with pt before sending message to Dr. Vaughan Browner.

## 2018-09-23 NOTE — Telephone Encounter (Signed)
Pt stated she had a deductible to meet and then her inhaler would be $47 a month.  Pt was reluctant to pick up script in case she could not tolerate it.  Instructed pt I would leave a sample at the front desk to try and if she tolerates it then pick up at pharmacy.  If she does not tolerate to call us back.  Nothing further is needed.

## 2018-09-23 NOTE — Telephone Encounter (Signed)
Patient called back. She stated that she was not given instructions on how to use Symbicort 160. Advised her of the directions, she verbalized understanding.   Nothing further needed at time of call.

## 2018-09-26 ENCOUNTER — Ambulatory Visit (INDEPENDENT_AMBULATORY_CARE_PROVIDER_SITE_OTHER): Payer: Medicare Other | Admitting: Primary Care

## 2018-09-26 ENCOUNTER — Ambulatory Visit (INDEPENDENT_AMBULATORY_CARE_PROVIDER_SITE_OTHER)
Admission: RE | Admit: 2018-09-26 | Discharge: 2018-09-26 | Disposition: A | Discharge status: Home / Self Care | Payer: Medicare Other | Source: Ambulatory Visit | Attending: Primary Care | Admitting: Primary Care

## 2018-09-26 ENCOUNTER — Encounter: Payer: Self-pay | Admitting: Primary Care

## 2018-09-26 ENCOUNTER — Telehealth: Payer: Self-pay | Admitting: Pulmonary Disease

## 2018-09-26 VITALS — BP 138/80 | HR 87 | Temp 98.2°F | Ht 63.0 in | Wt 197.6 lb

## 2018-09-26 DIAGNOSIS — R6889 Other general symptoms and signs: Secondary | ICD-10-CM | POA: Insufficient documentation

## 2018-09-26 DIAGNOSIS — J849 Interstitial pulmonary disease, unspecified: Secondary | ICD-10-CM

## 2018-09-26 LAB — RESPIRATORY VIRUS PANEL
INFLUENZA A RNA: DETECTED — AB
Influenza B RNA: NOT DETECTED
RSV RNA: NOT DETECTED
hMPV: NOT DETECTED

## 2018-09-26 MED ORDER — FLUTTER DEVI: 1.0000 | 0 refills | Status: AC

## 2018-09-26 MED ORDER — LEVALBUTEROL HCL 0.63 MG/3ML IN NEBU
0.6300 mg | INHALATION_SOLUTION | Freq: Once | RESPIRATORY_TRACT | Status: AC
  Administered 2018-09-26: 0.63 mg via RESPIRATORY_TRACT

## 2018-09-26 MED ORDER — PREDNISONE 10 MG PO TABS: ORAL_TABLET | ORAL | 0 refills | Status: DC

## 2018-09-26 MED ORDER — OSELTAMIVIR PHOSPHATE 75 MG PO CAPS: 75.0000 mg | ORAL_CAPSULE | Freq: Two times a day (BID) | ORAL | 0 refills | Status: DC

## 2018-09-26 NOTE — Progress Notes (Signed)
@Patient  ID: [name], female    DOB: May 06, 1946, 73 y.o.   MRN: [id number]  Chief Complaint  Patient presents with  . Acute Visit    complains of runny nose, deep cough with green mucus production. body aches, chills, no fever.     Referring provider: Robyne Peers, MD  HPI: 73 year old female, former smoker. PMH significant for interstitial lung disease (unclear cause). Patient of Dr. Vaughan Browner, last seen on 09/22/18. Spirometry and labs normal. Started on Symbicort 160 and referred to pulmonary rehab.   09/26/2018 Patient presents today with acute complaints of increased productive cough with thick green mucus. Accompanied by her husband. Associated HA, body aches, chills, runny nose, sneezing x1 day. Decreased appetite. Cough is productive. Breathing is labored at rest and with activity. No significant wheezing. Reports jitters from Symbicort x1 day. Took inhaler with no issues today. Reports symptoms starting after being at a wedding in [place] this weekend. No travel outside the [place]. Denies fever, N/V/D.   Allergies  Allergen Reactions  . Codeine Other (See Comments)    nightmares  . Hydrocod Polst-Cpm Polst Er Itching  . Hydrocodone-Acetaminophen Itching and Other (See Comments)    Has to take benadryl with to tolerate  . Sulfa Antibiotics Rash  . Sulfonamide Derivatives Rash    Immunization History  Administered Date(s) Administered  . Influenza, High Dose Seasonal PF 05/15/2015, 08/06/2017, 05/09/2018  . Influenza,inj,Quad PF,6+ Mos 04/14/2013  . Pneumococcal Polysaccharide-23 10/27/2007, 04/14/2013  . Td 07/27/2001  . Tdap 05/08/2011    Past Medical History:  Diagnosis Date  . Anxiety state, unspecified   . Benign neoplasm of colon   . Brain tumor ([place])   . Colon polyps    hyperplastic  . Complication of anesthesia    requests dilaudid for pain  . Complications affecting other specified body systems, hypertension    No cardiologist   . Contact  lens/glasses fitting    wears glassses or contacts  . Depressive disorder, not elsewhere classified   . Dyspnea   . Esophageal reflux   . Esophageal ring   . Family history of colon cancer mother  . Fundic gland polyps of stomach, benign   . Hemorrhoids   . Hiatal hernia   . Hypertension   . Hypothyroidism   . Lymphocytic colitis   . Obesity   . Other and unspecified hyperlipidemia   . Seizures ([place])    x 1    Tobacco History: Social History   Tobacco Use  Smoking Status Former Smoker  . Packs/day: 0.50  . Years: 20.00  . Pack years: 10.00  . Last attempt to quit: 09/02/2010  . Years since quitting: 8.0  Smokeless Tobacco Never Used   Counseling given: Not Answered   Outpatient Medications Prior to Visit  Medication Sig Dispense Refill  . AMBULATORY NON FORMULARY MEDICATION Medication Name: Nitroglycerin ointment 0.125% use pea sized amount per rectum three times a day 30 g 1  . Ascorbic Acid (VITAMIN C) 1000 MG tablet Take 1,000 mg by mouth daily.      Marland Kitchen aspirin 81 MG tablet Take 81 mg by mouth daily.     Marland Kitchen atorvastatin (LIPITOR) 40 MG tablet Take 40 mg by mouth daily.     . budesonide-formoterol (SYMBICORT) 160-4.5 MCG/ACT inhaler Inhale 2 puffs into the lungs 2 (two) times daily. 1 Inhaler 6  . budesonide-formoterol (SYMBICORT) 160-4.5 MCG/ACT inhaler Inhale 2 puffs into the lungs 2 (two) times daily. 1 Inhaler 0  .  calcium-vitamin D (OSCAL WITH D) 500-200 MG-UNIT tablet Take 1 tablet by mouth daily.     . cetirizine (ZYRTEC) 10 MG tablet Take 10 mg by mouth daily.    . CVS HYDROCORTISONE ACETATE 0.5 % cream APPLY TO AFFECTED AREA TOPICALLY AS NEEDED FOR HEMORRHOIDS (Patient taking differently: Apply 1 application topically daily as needed (for hemorrhoids). ) 60 g 0  . diphenoxylate-atropine (LOMOTIL) 2.5-0.025 MG tablet Take 1 tab by mouth as needed. 180 tablet 2  . famotidine (PEPCID) 20 MG tablet TAKE 1 TABLET BY MOUTH EVERYDAY AT BEDTIME 30 tablet 1  . fluticasone  (FLONASE) 50 MCG/ACT nasal spray Place 2 sprays into both nostrils daily.     . hydrochlorothiazide 25 MG tablet Take 25 mg by mouth daily.     Marland Kitchen levothyroxine (SYNTHROID, LEVOTHROID) 125 MCG tablet Take 125 mcg by mouth daily before breakfast.    . Omega-3 Fatty Acids (FISH OIL PO) Take 1 capsule by mouth daily.     . pantoprazole (PROTONIX) 40 MG tablet TAKE 1 TABLET BY MOUTH EVERY DAY 30 tablet 1  . sertraline (ZOLOFT) 100 MG tablet Take 100 mg by mouth daily.    . Wheat Dextrin (BENEFIBER PO) Take 1 each by mouth daily.     No facility-administered medications prior to visit.    Review of Systems  Review of Systems  Constitutional: Positive for chills.       Body aches  HENT: Positive for postnasal drip.   Respiratory: Positive for cough and shortness of breath.   Cardiovascular: Negative.   Neurological: Positive for headaches.   Physical Exam  BP 138/80 (BP Location: Left Arm, Cuff Size: Normal)   Pulse 87   Temp 98.2 F (36.8 C) (Oral)   Ht 5\' 3"  (1.6 m)   Wt 197 lb 9.6 oz (89.6 kg)   SpO2 98%   BMI 35.00 kg/m  Physical Exam Constitutional:      General: She is not in acute distress.    Appearance: Normal appearance. She is diaphoretic. She is not ill-appearing.  HENT:     Head: Normocephalic and atraumatic.     Right Ear: Tympanic membrane normal. There is impacted cerumen.     Left Ear: Tympanic membrane normal. There is impacted cerumen.     Nose: Nose normal.     Mouth/Throat:     Mouth: Mucous membranes are moist.  Eyes:     Extraocular Movements: Extraocular movements intact.     Pupils: Pupils are equal, round, and reactive to light.  Neck:     Musculoskeletal: Normal range of motion and neck supple.  Cardiovascular:     Rate and Rhythm: Normal rate and regular rhythm.  Pulmonary:     Effort: Pulmonary effort is normal. No respiratory distress.     Breath sounds: Wheezing present.     Comments: Faint insp wheeze LUL, mild crackles  RLL/LLL Musculoskeletal: Normal range of motion.  Skin:    General: Skin is warm.  Neurological:     Mental Status: She is alert.  Psychiatric:        Mood and Affect: Mood normal.        Behavior: Behavior normal.        Thought Content: Thought content normal.        Judgment: Judgment normal.      Lab Results:  CBC    Component Value Date/Time   WBC 8.4 08/16/2018 0941   RBC 4.29 08/16/2018 0941   HGB 13.4 08/16/2018 0941  HCT 42.0 08/16/2018 0941   PLT 251 08/16/2018 0941   MCV 97.9 08/16/2018 0941   MCH 31.2 08/16/2018 0941   MCHC 31.9 08/16/2018 0941   RDW 12.9 08/16/2018 0941   LYMPHSABS 1.9 03/17/2018 1112   MONOABS 0.7 03/17/2018 1112   EOSABS 0.3 03/17/2018 1112   BASOSABS 0.1 03/17/2018 1112    BMET    Component Value Date/Time   NA 138 08/16/2018 0941   K 4.3 08/16/2018 0941   CL 104 08/16/2018 0941   CO2 26 08/16/2018 0941   GLUCOSE 113 (H) 08/16/2018 0941   BUN 14 08/16/2018 0941   CREATININE 0.69 08/16/2018 0941   CALCIUM 10.9 (H) 08/16/2018 0941   GFRNONAA >60 08/16/2018 0941   GFRAA >60 08/16/2018 0941    BNP No results found for: BNP  ProBNP No results found for: PROBNP  Imaging: Dg Chest 2 View  Result Date: 09/26/2018 CLINICAL DATA:  Flu-like symptoms for 2 days. History of hypertension and interstitial lung disease. EXAM: CHEST - 2 VIEW COMPARISON:  CT chest September 06, 2018 and chest radiograph August 15, 2018 FINDINGS: Cardiac silhouette is similarly enlarged. Mediastinal silhouette is not suspicious, calcified aortic arch. Chronic interstitial changes without pleural effusion. Similar faint LEFT mid lung zone airspace opacity corresponding to confluent fibrosis on prior CT. No pneumothorax. Moderate hiatal hernia. IMPRESSION: 1. Chronic interstitial changes without interval change. 2. Stable cardiomegaly. 3.  Aortic Atherosclerosis (ICD10-I70.0). Electronically Signed   By: Elon Alas M.D.   On: 09/26/2018 16:11      Assessment & Plan:   Flu-like symptoms - Symptoms consistent with influenza like illness - Tamiflu sent to pharmacy  - Awaiting respiratory viral panel  - Encourage oral hydration    ILD (interstitial lung disease) ([place]) - Patient reports increased shortness of breath and productive cough x1-2 days with acute viral illness - Given 1/2 Xopenex nebulizer given in office with improvement - CXR showed chronic interstitial changes without interval change - RX prednisone 20mg  x 5 days - Continue Symbicort 160 two puffs twice daily  - Delysm and mucinex twice daily for cough  - Return if symptoms do not improve or worsen    Martyn Ehrich, NP 09/26/2018

## 2018-09-26 NOTE — Telephone Encounter (Signed)
Primary Pulmonologist: PM Last office visit and with whom: 09/22/2018 What do we see them for (pulmonary problems): ILD Last OV assessment/plan:  Versions: 1. Marshell Garfinkel, MD (Physician) at 09/22/2018 1:05 PM - Signed    I have reviewed your lung function test which shows normal lung function Your labs are normal as well At this point we do not have a clear idea why you have interstitial lung disease  I will discuss with Dr. Chase Caller about scheduling you for a bronchoscope for further evaluation We will start you on Symbicort 160 We will also refer you to pulmonary rehab.     Was appointment offered to patient (explain)?  Yes, today with EW at 3pm  Reason for call: Patient has increase SOB, wheezing, productive cough-green thick mucus, chills, body aches, runny nose, sneezing and some chest tightness. Pt is weak and tired more in last 24 hours. Pt recently started Symbicort 160 inhaler over the weekend and has the jitters that last over 2 hours. She is not liking these results of the Symbicort. Scheduled appt with EW today at 3pm.  Nothing further needed at this time.

## 2018-09-26 NOTE — Patient Instructions (Signed)
  Orders: CXR and respiratory viral panel  RX: Tamiflu  Prednisone 20mg  x 5 days   Recommendations: Continue Symbicort  Delsym and mucinex twice daily for cough  Flutter valve every hour  Stay well hydrated   Follow-up: If not better or symptoms worsen please return

## 2018-09-26 NOTE — Assessment & Plan Note (Addendum)
-   Symptoms consistent with influenza like illness - Tamiflu sent to pharmacy  - Awaiting respiratory viral panel  - Encourage oral hydration

## 2018-09-26 NOTE — Assessment & Plan Note (Addendum)
-   Patient reports increased shortness of breath and productive cough x1-2 days with acute viral illness - Given 1/2 Xopenex nebulizer given in office with improvement - CXR showed chronic interstitial changes without interval change - RX prednisone 20mg  x 5 days - Continue Symbicort 160 two puffs twice daily  - Delysm and mucinex twice daily for cough  - Return if symptoms do not improve or worsen

## 2018-09-29 ENCOUNTER — Telehealth (HOSPITAL_COMMUNITY): Payer: Self-pay

## 2018-09-29 NOTE — Telephone Encounter (Signed)
Referral received from MD [facility] for Pulmonary Rehab with diagnosis of ILD. Clinical review of pt follow up appt on 2/27 Pulmonary office note and subsequent sick visit with NP Volanda Napoleon on 3/2 in which pt was positive for flu. Pt appropriate for scheduling for Pulmonary rehab once pt has completed all necessary courses of treatment.  Will forward to support staff for scheduling with sufficient time for flu treatment and verification of insurance eligibility/benefits with pt consent.  Joycelyn Man, RN, BSN Cardiac and Pulmonary Rehab Nurse

## 2018-10-03 ENCOUNTER — Telehealth (HOSPITAL_COMMUNITY): Payer: Self-pay

## 2018-10-03 NOTE — Telephone Encounter (Signed)
Called patient to see if she was interested in participating in the Pulmonary Rehab Program. Patient stated yes. Patient will come in for orientation on 11/11/2018 @ 1:30pm and will attend the 1:30pm exercise class.  Mailed homework package. Tedra [place]. Support Rep II

## 2018-10-03 NOTE — Telephone Encounter (Signed)
Pt insurance is active and benefits verified through Medicare a/b Co-pay 0, DED $198/0 met, out of pocket 0/0 met, co-insurance 20%. no pre-authorization required, 2ndary insurance is active and benefits verified through [place]. Co-pay 0, DED 0/0 met, out of pocket 0/0 met, co-insurance 0. No pre-authorization required, Tedra [place]. Support Rep II

## 2018-10-07 ENCOUNTER — Telehealth: Payer: Self-pay | Admitting: Internal Medicine

## 2018-10-07 ENCOUNTER — Encounter (HOSPITAL_COMMUNITY): Payer: Self-pay

## 2018-10-07 DIAGNOSIS — J849 Interstitial pulmonary disease, unspecified: Secondary | ICD-10-CM

## 2018-10-07 NOTE — Telephone Encounter (Signed)
Spoke with the pt  She states she is wanting to cancel her bronch due to fear of covid 19  She wants to keep her appt 10/25/2018 with Dr Vaughan Browner  Will forward to Dr Chase Caller to make him aware  I called Baxter Flattery and notified her already

## 2018-10-07 NOTE — Telephone Encounter (Signed)
Spoke to patient. She had flu A 09/26/2018. Caryl Never with COVID concern and need to social distance and hand hygiene. Also  - move end march 2020 PM visit to end April 2020 - ILD clinic with spirometry and dlco - to see Nickolaus Bordelon instead of Mannam  - she will take a decision on pulmonary rehab - If she wil go for it  - yes, cancel bronch  - also continue symbicort because she thinks is helping her -> so told her to continue     SIGNATURE    Dr. Brand Males, M.D., F.C.C.P,  Pulmonary and Critical Care Medicine Staff Physician, [place] Director - Interstitial Lung Disease  Program  P[facility] at [place], [place], [address]  Pager: [phone], If no answer or between  15:00h - 7:00h: call [phone] Telephone: [phone]  3:59 PM 10/07/2018

## 2018-10-07 NOTE — Telephone Encounter (Signed)
Called and spoke with pt in regards to the info stated by MR and stated to her that he wanted me to change her appt from end of march to end of April. Pt expressed understanding. I have rescheduled pt's appt to 11/24/2018 with PFT at 11am followed by OV with MR at 11:30. Nothing further needed.

## 2018-10-10 ENCOUNTER — Other Ambulatory Visit: Payer: Self-pay | Admitting: Gastroenterology

## 2018-10-13 ENCOUNTER — Encounter (HOSPITAL_COMMUNITY): Admission: RE | Payer: Self-pay | Source: Home / Self Care

## 2018-10-13 ENCOUNTER — Encounter (HOSPITAL_COMMUNITY): Payer: Self-pay

## 2018-10-13 ENCOUNTER — Ambulatory Visit (HOSPITAL_COMMUNITY): Admission: RE | Admit: 2018-10-13 | Payer: Medicare Other | Source: Home / Self Care | Admitting: Internal Medicine

## 2018-10-13 ENCOUNTER — Telehealth: Payer: Self-pay | Admitting: Internal Medicine

## 2018-10-13 SURGERY — BRONCHOSCOPY, WITH FLUOROSCOPY
Anesthesia: Moderate Sedation | Laterality: Bilateral

## 2018-10-13 NOTE — Telephone Encounter (Signed)
Called and spoke with patient she is requesting that we send in a prescription for spacer. MR please advise if we can send this in. Thank you.

## 2018-10-14 MED ORDER — SPACER/AERO-HOLDING CHAMBERS DEVI: 1.0000 | 0 refills | Status: AC

## 2018-10-14 NOTE — Telephone Encounter (Signed)
That is fine 

## 2018-10-14 NOTE — Telephone Encounter (Signed)
Spoke with patient, prescription has been sent in for aero chamber. Nothing further needed.

## 2018-10-17 ENCOUNTER — Other Ambulatory Visit: Payer: Self-pay | Admitting: Gastroenterology

## 2018-10-19 ENCOUNTER — Other Ambulatory Visit: Payer: Self-pay

## 2018-10-19 MED ORDER — PANTOPRAZOLE SODIUM 40 MG PO TBEC: 40.0000 mg | DELAYED_RELEASE_TABLET | Freq: Every day | ORAL | 0 refills | Status: DC

## 2018-10-19 MED ORDER — FAMOTIDINE 20 MG PO TABS: ORAL_TABLET | ORAL | 0 refills | Status: DC

## 2018-10-25 ENCOUNTER — Ambulatory Visit: Payer: Self-pay | Admitting: Internal Medicine

## 2018-10-25 ENCOUNTER — Ambulatory Visit: Payer: Self-pay | Admitting: Pulmonary Disease

## 2018-11-01 ENCOUNTER — Telehealth (HOSPITAL_COMMUNITY): Payer: Self-pay

## 2018-11-11 ENCOUNTER — Ambulatory Visit (HOSPITAL_COMMUNITY): Payer: Self-pay

## 2018-11-22 ENCOUNTER — Telehealth (HOSPITAL_COMMUNITY): Payer: Self-pay | Admitting: *Deleted

## 2018-11-24 ENCOUNTER — Ambulatory Visit: Payer: Self-pay | Admitting: Internal Medicine

## 2018-12-14 ENCOUNTER — Ambulatory Visit: Payer: Medicare Other | Admitting: Adult Health

## 2018-12-14 ENCOUNTER — Emergency Department (HOSPITAL_BASED_OUTPATIENT_CLINIC_OR_DEPARTMENT_OTHER): Payer: Medicare Other

## 2018-12-14 ENCOUNTER — Encounter (HOSPITAL_BASED_OUTPATIENT_CLINIC_OR_DEPARTMENT_OTHER): Payer: Self-pay | Admitting: Emergency Medicine

## 2018-12-14 ENCOUNTER — Other Ambulatory Visit: Payer: Self-pay

## 2018-12-14 ENCOUNTER — Emergency Department (HOSPITAL_BASED_OUTPATIENT_CLINIC_OR_DEPARTMENT_OTHER)
Admission: EM | Admit: 2018-12-14 | Discharge: 2018-12-14 | Disposition: A | Discharge status: Home / Self Care | Payer: Medicare Other | Attending: Emergency Medicine | Admitting: Emergency Medicine

## 2018-12-14 DIAGNOSIS — Z7982 Long term (current) use of aspirin: Secondary | ICD-10-CM | POA: Insufficient documentation

## 2018-12-14 DIAGNOSIS — M7121 Synovial cyst of popliteal space [Baker], right knee: Secondary | ICD-10-CM | POA: Insufficient documentation

## 2018-12-14 DIAGNOSIS — M79661 Pain in right lower leg: Secondary | ICD-10-CM | POA: Diagnosis present

## 2018-12-14 DIAGNOSIS — Z79899 Other long term (current) drug therapy: Secondary | ICD-10-CM | POA: Insufficient documentation

## 2018-12-14 DIAGNOSIS — I10 Essential (primary) hypertension: Secondary | ICD-10-CM | POA: Insufficient documentation

## 2018-12-14 DIAGNOSIS — Z87891 Personal history of nicotine dependence: Secondary | ICD-10-CM | POA: Insufficient documentation

## 2018-12-14 DIAGNOSIS — M7989 Other specified soft tissue disorders: Secondary | ICD-10-CM | POA: Insufficient documentation

## 2018-12-14 NOTE — ED Provider Notes (Signed)
[facility] Provider Note   CSN: [id number] Arrival date & time: 12/14/18  1049    History   Chief Complaint Chief Complaint  Patient presents with   Leg Pain    HPI [name] is a 73 y.o. female.     73 y.o female with an extensive PMH presents to the ED with with a chief complaint of right lower extremity pain x yesterday. Patient reports a cramping sensation to the back of her right calf worse with ambulation. She also endorses pain along her right knee but states she has a long history of arthritis. She states throughout the past week she's been yard work were she is sitting 8-9 hours daily. She was recently diagnosed with pulmonary fibrosis and reports she's been experiencing shortness of breath. She denies any chest pain, fever, or other complaints. No previous history of blood clots, does take aspirin daily.      Past Medical History:  Diagnosis Date   Anxiety state, unspecified    Benign neoplasm of colon    Brain tumor ([place])    Colon polyps    hyperplastic   Complication of anesthesia    requests dilaudid for pain   Complications affecting other specified body systems, hypertension    No cardiologist    Contact lens/glasses fitting    wears glassses or contacts   Depressive disorder, not elsewhere classified    Dyspnea    Esophageal reflux    Esophageal ring    Family history of colon cancer mother   Fundic gland polyps of stomach, benign    Hemorrhoids    Hiatal hernia    Hypertension    Hypothyroidism    Lymphocytic colitis    Obesity    Other and unspecified hyperlipidemia    Seizures ([place])    x 1    Patient Active Problem List   Diagnosis Date Noted   Flu-like symptoms 09/26/2018   ILD (interstitial lung disease) ([place]) 09/26/2018   Tinnitus, bilateral 10/20/2016   Excessive cerumen in both ear canals 10/20/2016   Benign essential tremor 06/09/2016   Benign paroxysmal positional  vertigo 06/09/2016   History of benign brain tumor 06/09/2016   Gait disturbance 06/09/2016   Trimalleolar fracture of ankle, closed 04/03/2015   Lumbar radiculopathy 04/19/2013   DIARRHEA 09/15/2010   PERSONAL HISTORY OF FAILED MODERATE SEDATION 09/15/2010   DIARRHEA-PRESUMED INFECTIOUS 07/24/2010   HEMORRHOIDS-INTERNAL 12/10/2008   HEMORRHOIDS-EXTERNAL 12/10/2008   PERSONAL HX COLONIC POLYPS 12/10/2008   COLONIC POLYPS, HYPERPLASTIC 11/22/2008   HYPERLIPIDEMIA 11/22/2008   ANXIETY 11/22/2008   DEPRESSION 11/22/2008   ABNORMAL HEART RHYTHMS 11/22/2008   HEMORRHOIDS 11/22/2008   GERD 11/22/2008   Diaphragmatic hernia 11/22/2008   HYPERTENSION NEC 11/22/2008    Past Surgical History:  Procedure Laterality Date   ABDOMINAL HYSTERECTOMY     APPENDECTOMY     BACK SURGERY     BRAIN SURGERY  1999   benign tumor   CARPAL TUNNEL RELEASE     right   CATARACT EXTRACTION, BILATERAL  04/2018   CRANIOTOMY     EYE SURGERY     HAMMERTOE RECONSTRUCTION WITH WEIL OSTEOTOMY  07/07/2012   Procedure: HAMMERTOE RECONSTRUCTION WITH WEIL OSTEOTOMY;  Surgeon: Wylene Simmer, MD;  Location: [facility];  Service: Orthopedics;  Laterality: Left;  Left Second through [place]; Left Fifth Toe Flexor Tendon Release, and Left Second through [place] Osteotomy   ORIF ANKLE FRACTURE Left 04/04/2015   Procedure: OPEN  REDUCTION INTERNAL FIXATION (ORIF) LEFT ANKLE FRACTURE;  Surgeon: Wylene Simmer, MD;  Location: [facility];  Service: Orthopedics;  Laterality: Left;     OB History   No obstetric history on file.      Home Medications    Prior to Admission medications   Medication Sig Start Date End Date Taking? Authorizing Provider  atorvastatin (LIPITOR) 40 MG tablet Take 40 mg by mouth daily. 11/29/18  Yes [provider]  hydrochlorothiazide (HYDRODIURIL) 25 MG tablet Take 25 mg by mouth daily. 10/03/18  Yes [provider]    levothyroxine (SYNTHROID) 125 MCG tablet Take 125 mcg by mouth daily. 12/08/18  Yes [provider]  sertraline (ZOLOFT) 100 MG tablet Take 150 mg by mouth daily. 11/29/18  Yes [provider]  AMBULATORY NON FORMULARY MEDICATION Medication Name: Nitroglycerin ointment 0.125% use pea sized amount per rectum three times a day 07/11/18   Mauri Pole, MD  Ascorbic Acid (VITAMIN C) 1000 MG tablet Take 1,000 mg by mouth daily.      [provider]  aspirin 81 MG tablet Take 81 mg by mouth daily.     [provider]  budesonide-formoterol (SYMBICORT) 160-4.5 MCG/ACT inhaler Inhale 2 puffs into the lungs 2 (two) times daily. 09/22/18   Mannam, Hart Robinsons, MD  budesonide-formoterol (SYMBICORT) 160-4.5 MCG/ACT inhaler Inhale 2 puffs into the lungs 2 (two) times daily. 09/23/18   Mannam, Hart Robinsons, MD  calcium-vitamin D (OSCAL WITH D) 500-200 MG-UNIT tablet Take 1 tablet by mouth daily.     [provider]  cetirizine (ZYRTEC) 10 MG tablet Take 10 mg by mouth daily.    [provider]  CVS HYDROCORTISONE ACETATE 0.5 % cream APPLY TO AFFECTED AREA TOPICALLY AS NEEDED FOR HEMORRHOIDS Patient taking differently: Apply 1 application topically daily as needed (for hemorrhoids).  02/10/16   Irene Shipper, MD  diphenoxylate-atropine (LOMOTIL) 2.5-0.025 MG tablet Take 1 tab by mouth as needed. 03/17/18   Esterwood, Amy S, PA-C  famotidine (PEPCID) 20 MG tablet TAKE 1 TABLET BY MOUTH EVERYDAY AT BEDTIME 10/19/18   Nandigam, Venia Minks, MD  fluticasone (FLONASE) 50 MCG/ACT nasal spray Place 2 sprays into both nostrils daily.     [provider]  gabapentin (NEURONTIN) 300 MG capsule Take 300 mg by mouth at bedtime. 11/29/18   [provider]  Omega-3 Fatty Acids (FISH OIL PO) Take 1 capsule by mouth daily.     [provider]  oseltamivir (TAMIFLU) 75 MG capsule Take 1 capsule (75 mg total) by mouth 2 (two) times daily. 09/26/18   Martyn Ehrich, NP  pantoprazole (PROTONIX) 40 MG tablet Take 1 tablet (40 mg total) by mouth daily. 10/19/18 01/17/19  Mauri Pole, MD  predniSONE (DELTASONE) 10 MG tablet Take 2 tabs x 5 days 09/26/18   Martyn Ehrich, NP  Respiratory Therapy Supplies (FLUTTER) DEVI 1 Device by Does not apply route as directed. 09/26/18   Martyn Ehrich, NP  Spacer/Aero-Holding Josiah Lobo DEVI 1 Device by Does not apply route as directed. 10/14/18   Brand Males, MD  Wheat Dextrin (BENEFIBER PO) Take 1 each by mouth daily.    [provider]    Family History Family History  Problem Relation Age of Onset   Breast cancer Mother    Colon cancer Mother    Lung cancer Maternal Uncle    Bone cancer Maternal Grandmother    Heart attack Father     Social History Social History  Tobacco Use   Smoking status: Former Smoker    Packs/day: 0.50    Years: 20.00    Pack years: 10.00    Last attempt to quit: 09/02/2010    Years since quitting: 8.2   Smokeless tobacco: Never Used  Substance Use Topics   Alcohol use: Yes    Alcohol/week: 7.0 standard drinks    Types: 7 drink(s) per week    Comment: 1 martini daily on most days   Drug use: No     Allergies   Codeine; Hydrocod polst-cpm polst er; Hydrocodone-acetaminophen; Sulfa antibiotics; and Sulfonamide derivatives   Review of Systems Review of Systems  Constitutional: Negative for fever.  Respiratory: Positive for shortness of breath. Negative for chest tightness.   Cardiovascular: Positive for leg swelling. Negative for chest pain.  Skin: Negative for wound.     Physical Exam Updated Vital Signs BP (!) 141/72 (BP Location: Left Arm)    Pulse 86    Temp 98.1 F (36.7 C) (Oral)    Resp 20    Ht 5\' 3"  (1.6 m)    Wt 88.5 kg    SpO2 98%    BMI 34.54 kg/m   Physical Exam Vitals signs and nursing note reviewed.  Constitutional:      General: She is not in acute distress.    Appearance: She is well-developed.  HENT:      Head: Normocephalic and atraumatic.     Mouth/Throat:     Pharynx: No oropharyngeal exudate.  Eyes:     Pupils: Pupils are equal, round, and reactive to light.  Neck:     Musculoskeletal: Normal range of motion.  Cardiovascular:     Rate and Rhythm: Regular rhythm.     Heart sounds: Normal heart sounds.  Pulmonary:     Effort: Pulmonary effort is normal. No respiratory distress.     Breath sounds: Normal breath sounds.  Abdominal:     General: Bowel sounds are normal. There is no distension.     Palpations: Abdomen is soft.     Tenderness: There is no abdominal tenderness.  Musculoskeletal:        General: No deformity.     Right knee: Normal. She exhibits normal range of motion, no swelling and no effusion.     Left ankle: She exhibits decreased range of motion.     Right lower leg: She exhibits tenderness and swelling. No edema.     Left lower leg: No edema.       Legs:     Comments: Pulses present, tenderness upon squeezing of calf region.Pain with plantarflexion.  Left foot: Hx of drop foot.   Skin:    General: Skin is warm and dry.  Neurological:     Mental Status: She is alert and oriented to person, place, and time.      ED Treatments / Results  Labs (all labs ordered are listed, but only abnormal results are displayed) Labs Reviewed - No data to display  EKG None  Radiology US Venous Img Lower Unilateral Right  Result Date: 12/14/2018 CLINICAL DATA:  Right lower extremity pain and edema beginning yesterday. Evaluate for DVT. EXAM: RIGHT LOWER EXTREMITY VENOUS DOPPLER ULTRASOUND TECHNIQUE: Gray-scale sonography with graded compression, as well as color Doppler and duplex ultrasound were performed to evaluate the lower extremity deep venous systems from the level of the common femoral vein and including the common femoral, femoral, profunda femoral, popliteal and calf veins including the posterior tibial, peroneal and gastrocnemius veins  when visible. The  superficial great saphenous vein was also interrogated. Spectral Doppler was utilized to evaluate flow at rest and with distal augmentation maneuvers in the common femoral, femoral and popliteal veins. COMPARISON:  None. FINDINGS: Contralateral Common Femoral Vein: Respiratory phasicity is normal and symmetric with the symptomatic side. No evidence of thrombus. Normal compressibility. Common Femoral Vein: No evidence of thrombus. Normal compressibility, respiratory phasicity and response to augmentation. Saphenofemoral Junction: No evidence of thrombus. Normal compressibility and flow on color Doppler imaging. Profunda Femoral Vein: No evidence of thrombus. Normal compressibility and flow on color Doppler imaging. Femoral Vein: No evidence of thrombus. Normal compressibility, respiratory phasicity and response to augmentation. Popliteal Vein: No evidence of thrombus. Normal compressibility, respiratory phasicity and response to augmentation. Calf Veins: No evidence of thrombus. Normal compressibility and flow on color Doppler imaging. Superficial Great Saphenous Vein: No evidence of thrombus. Normal compressibility. Venous Reflux:  None. Other Findings: Note is made of an approximately 4.6 x 1.0 x 3.1 cm serpiginous fluid collection with the right popliteal fossa compatible with a Baker cyst. IMPRESSION: 1. No evidence of DVT within the right lower extremity. 2. Incidentally noted approximately 4.6 cm right-sided Baker's cyst. Electronically Signed   By: Sandi Mariscal M.D.   On: 12/14/2018 12:46    Procedures Procedures (including critical care time)  Medications Ordered in ED Medications - No data to display   Initial Impression / Assessment and Plan / ED Course  I have reviewed the triage vital signs and the nursing notes.  Pertinent labs & imaging results that were available during my care of the patient were reviewed by me and considered in my medical decision making (see chart for details).      Patient with a past medical history of pulmonary fibrosis presents to the ED with complaints of right leg pain since yesterday.  Reports she has been kind of experiencing pain along her right calf region that began about a week ago, has been spending long periods of time working on the yard 8 to 9 hours daily.  She reports helping her husband carry stuff recently which might have increased her pain on the right calf.  During evaluation leg is slightly more swollen than left leg, there is pain with squeezing of the calf, Homans sign.  She also has pain with plantarflexion.  Patient is currently on a baby aspirin, denies any previous history of blood clots.  No tachycardia, satting at 98% does have shortness of breath at baseline after being diagnosed with pulmonary fibrosis.  Denies any chest pain.  No changes in the skin, no streaking, low suspicion for cellulitis.  Ultrasound venous to rule out DVT was obtained which showed: 1. No evidence of DVT within the right lower extremity.  2. Incidentally noted approximately 4.6 cm right-sided Baker's cyst.   1:08 PM Results informed to patient, she will need to keep her leg elevated, apply heat or ice to the area along with anti-inflammatories.  Patient without tachycardia, satting at 98% on room air without increased work of breathing.  We will have her follow-up with PCP as needed.  Return precautions provided.  Final Clinical Impressions(s) / ED Diagnoses   Final diagnoses:  Baker's cyst of knee, right  Right leg swelling    ED Discharge Orders    None       Janeece Fitting, Hershal Coria 12/14/18 1314    Quintella Reichert, MD 12/17/18 [phone]

## 2018-12-14 NOTE — Discharge Instructions (Addendum)
Your [place] today was negative for DVT. Please alternate ice or heat to the area, keep your right leg elevated along with place ice to the area. Please follow up with your PCP in 1 week to reevaluate your symptoms.

## 2018-12-14 NOTE — ED Triage Notes (Signed)
Pt reports pain and swelling to RLE since yesterday w/o injury

## 2018-12-22 ENCOUNTER — Other Ambulatory Visit: Payer: Self-pay | Admitting: Internal Medicine

## 2018-12-27 ENCOUNTER — Telehealth: Payer: Self-pay | Admitting: Primary Care

## 2018-12-27 NOTE — Telephone Encounter (Signed)
Pt will need to rescheduled covid testing the week of 01/23/19. She needs 01/13/19 Covid testing cancelled due to not being able to self isolate. Please call to reschedule.

## 2018-12-28 NOTE — Telephone Encounter (Signed)
Pt's appt was cancelled and the message has been routed to [facility] for her to follow up on.

## 2018-12-29 NOTE — Telephone Encounter (Signed)
Called and r/s patient for PFT on 02/17/2019 -pr

## 2019-01-13 ENCOUNTER — Other Ambulatory Visit (HOSPITAL_COMMUNITY): Payer: Medicare Other

## 2019-01-17 ENCOUNTER — Ambulatory Visit: Payer: Self-pay | Admitting: Primary Care

## 2019-02-01 ENCOUNTER — Other Ambulatory Visit: Payer: Self-pay | Admitting: Gastroenterology

## 2019-02-02 ENCOUNTER — Other Ambulatory Visit: Payer: Self-pay | Admitting: Gastroenterology

## 2019-02-06 ENCOUNTER — Other Ambulatory Visit: Payer: Self-pay | Admitting: Internal Medicine

## 2019-02-07 ENCOUNTER — Telehealth (HOSPITAL_COMMUNITY): Payer: Self-pay

## 2019-02-13 ENCOUNTER — Other Ambulatory Visit (HOSPITAL_COMMUNITY)
Admission: RE | Admit: 2019-02-13 | Discharge: 2019-02-13 | Disposition: A | Discharge status: Home / Self Care | Payer: Medicare Other | Source: Ambulatory Visit | Attending: Internal Medicine | Admitting: Internal Medicine

## 2019-02-13 DIAGNOSIS — Z1159 Encounter for screening for other viral diseases: Secondary | ICD-10-CM | POA: Diagnosis present

## 2019-02-14 LAB — SARS CORONAVIRUS 2 (TAT 6-24 HRS): SARS Coronavirus 2: NEGATIVE

## 2019-02-15 ENCOUNTER — Telehealth (HOSPITAL_COMMUNITY): Payer: Self-pay | Admitting: *Deleted

## 2019-02-15 NOTE — Telephone Encounter (Signed)
Called to discuss referral to pulmonary rehab from Dr. Vaughan Browner.  Patient is unsure if she wants to participate, she will discuss with Dr. Vaughan Browner at her appointment in 2 days.

## 2019-02-17 ENCOUNTER — Ambulatory Visit (INDEPENDENT_AMBULATORY_CARE_PROVIDER_SITE_OTHER): Payer: Medicare Other | Admitting: Primary Care

## 2019-02-17 ENCOUNTER — Other Ambulatory Visit: Payer: Self-pay

## 2019-02-17 ENCOUNTER — Ambulatory Visit (INDEPENDENT_AMBULATORY_CARE_PROVIDER_SITE_OTHER): Payer: Medicare Other | Admitting: Internal Medicine

## 2019-02-17 ENCOUNTER — Encounter: Payer: Self-pay | Admitting: Primary Care

## 2019-02-17 VITALS — BP 138/72 | HR 66 | Temp 98.0°F | Ht 64.0 in | Wt 195.4 lb

## 2019-02-17 DIAGNOSIS — J849 Interstitial pulmonary disease, unspecified: Secondary | ICD-10-CM | POA: Diagnosis not present

## 2019-02-17 DIAGNOSIS — J45909 Unspecified asthma, uncomplicated: Secondary | ICD-10-CM | POA: Insufficient documentation

## 2019-02-17 DIAGNOSIS — J4520 Mild intermittent asthma, uncomplicated: Secondary | ICD-10-CM

## 2019-02-17 LAB — PULMONARY FUNCTION TEST
DL/VA % pred: 106 %
DL/VA: 4.41 ml/min/mmHg/L
DLCO cor % pred: 93 %
DLCO cor: 17.4 ml/min/mmHg
DLCO unc % pred: 81 %
DLCO unc: 15.19 ml/min/mmHg
FEF 25-75 Pre: 2.18 L/sec
FEF2575-%Pred-Pre: 128 %
FEV1-%Pred-Pre: 98 %
FEV1-Pre: 2.04 L
FEV1FVC-%Pred-Pre: 109 %
FEV6-%Pred-Pre: 93 %
FEV6-Pre: 2.47 L
FEV6FVC-%Pred-Pre: 105 %
FVC-%Pred-Pre: 89 %
FVC-Pre: 2.47 L
Pre FEV1/FVC ratio: 83 %
Pre FEV6/FVC Ratio: 100 %

## 2019-02-17 LAB — HEMOGLOBIN: Hemoglobin: 9.9 g/dL — ABNORMAL LOW (ref 12.0–15.0)

## 2019-02-17 MED ORDER — BUDESONIDE-FORMOTEROL FUMARATE 160-4.5 MCG/ACT IN AERO: 2.0000 | INHALATION_SPRAY | Freq: Two times a day (BID) | RESPIRATORY_TRACT | 6 refills | Status: AC

## 2019-02-17 NOTE — Progress Notes (Signed)
Spirometry and Dlco done today. 

## 2019-02-17 NOTE — Patient Instructions (Signed)
Spirometry was stable, DLCO normal but decreased from 6 months ago   Needs HRCT in 1-2 months re: ild  Labs today (hemoglobin)  Follow up with MR in ILD clinic in 3-4 months

## 2019-02-17 NOTE — Progress Notes (Signed)
@Patient  ID: [name], female    DOB: 1945/12/12, 73 y.o.   MRN: [id number]  Chief Complaint  Patient presents with  . Follow-up    pft results, with heat and humidity stays in more, unable to exercise    Referring provider: Robyne Peers, MD  HPI: 73 year old female, former smoker. PMH significant for interstitial lung disease (unclear cause). Patient of Dr. Vaughan Browner, last seen on 09/22/18. Spirometry and labs normal. Started on Symbicort 160 and referred to pulmonary rehab. CXR 09/26/18 showed chronic interstitial changes without interval change. Recommended pulmonary rehab. Cancelled bronchoscopy d/t covid concerns. PFTs rescheduled.   02/17/2019 Patient presents today for spirometry with DLCO. Accompanied by husband by phone call. She is feeling well, thinks her breathing has improved since using Symbicort. Shortness of breath hasn't seemed to bother her much. Reports dyspnea on exertion only. Spirometry is stable, DLCO is normal but has decreased since February. Will check hemoglobin to correlate. Denies cough, wheezing.   PFTs: 02/17/2019- FVC 2.47 (89%), FEV1 2.04 (98%), ratio 83, DLCOunc 15.19 (81%) 09/22/2018- FVC 2.23 [80%), FEV1 1.80 [6%], F/F 81, DLCO 17.09 91%  Labs: ILD serologies 09/22/2018-all negative except for rheumatoid factor of 15 CBC 03/17/2018-WBC 7.4, eos 4%, absolute eosinophil count 296  FENO 09/22/2018- 40  Allergies  Allergen Reactions  . Codeine Other (See Comments)    nightmares  . Hydrocod Polst-Cpm Polst Er Itching  . Hydrocodone-Acetaminophen Itching and Other (See Comments)    Has to take benadryl with to tolerate  . Sulfa Antibiotics Rash  . Sulfonamide Derivatives Rash    Immunization History  Administered Date(s) Administered  . Influenza, High Dose Seasonal PF 05/15/2015, 08/06/2017, 05/09/2018  . Influenza,inj,Quad PF,6+ Mos 04/14/2013  . Pneumococcal Polysaccharide-23 10/27/2007, 04/14/2013  . Td 07/27/2001  . Tdap 05/08/2011     Past Medical History:  Diagnosis Date  . Anxiety state, unspecified   . Benign neoplasm of colon   . Brain tumor ([place])   . Colon polyps    hyperplastic  . Complication of anesthesia    requests dilaudid for pain  . Complications affecting other specified body systems, hypertension    No cardiologist   . Contact lens/glasses fitting    wears glassses or contacts  . Depressive disorder, not elsewhere classified   . Dyspnea   . Esophageal reflux   . Esophageal ring   . Family history of colon cancer mother  . Fundic gland polyps of stomach, benign   . Hemorrhoids   . Hiatal hernia   . Hypertension   . Hypothyroidism   . Lymphocytic colitis   . Obesity   . Other and unspecified hyperlipidemia   . Seizures ([place])    x 1    Tobacco History: Social History   Tobacco Use  Smoking Status Former Smoker  . Packs/day: 0.50  . Years: 20.00  . Pack years: 10.00  . Quit date: 09/02/2010  . Years since quitting: 8.4  Smokeless Tobacco Never Used   Counseling given: Not Answered   Outpatient Medications Prior to Visit  Medication Sig Dispense Refill  . Ascorbic Acid (VITAMIN C) 1000 MG tablet Take 1,000 mg by mouth daily.      Marland Kitchen aspirin 81 MG tablet Take 81 mg by mouth daily.     Marland Kitchen atorvastatin (LIPITOR) 40 MG tablet Take 40 mg by mouth daily.    . budesonide (ENTOCORT EC) 3 MG 24 hr capsule Take 9 mg by mouth daily. Take 3,  3mg  tabs  in morning as needed    . budesonide-formoterol (SYMBICORT) 160-4.5 MCG/ACT inhaler Inhale 2 puffs into the lungs 2 (two) times daily. 1 Inhaler 6  . calcium-vitamin D (OSCAL WITH D) 500-200 MG-UNIT tablet Take 1 tablet by mouth daily.     . cetirizine (ZYRTEC) 10 MG tablet Take 10 mg by mouth daily.    . CVS HYDROCORTISONE ACETATE 0.5 % cream APPLY TO AFFECTED AREA TOPICALLY AS NEEDED FOR HEMORRHOIDS (Patient taking differently: Apply 1 application topically daily as needed (for hemorrhoids). ) 60 g 0  . diphenoxylate-atropine (LOMOTIL) 2.5-0.025 MG  tablet Take 1 tab by mouth as needed. 180 tablet 2  . famotidine (PEPCID) 20 MG tablet TAKE 1 TABLET BY MOUTH EVERYDAY AT BEDTIME 90 tablet 0  . fluticasone (FLONASE) 50 MCG/ACT nasal spray Place 2 sprays into both nostrils daily.     Marland Kitchen gabapentin (NEURONTIN) 300 MG capsule Take 300 mg by mouth at bedtime.    . hydrochlorothiazide (HYDRODIURIL) 25 MG tablet Take 25 mg by mouth daily.    Marland Kitchen levothyroxine (SYNTHROID) 125 MCG tablet Take 125 mcg by mouth daily.    . Omega-3 Fatty Acids (FISH OIL PO) Take 1 capsule by mouth daily.     . pantoprazole (PROTONIX) 40 MG tablet TAKE 1 TABLET BY MOUTH EVERY DAY 90 tablet 0  . Respiratory Therapy Supplies (FLUTTER) DEVI 1 Device by Does not apply route as directed. 1 each 0  . sertraline (ZOLOFT) 100 MG tablet Take 150 mg by mouth daily.    Marland Kitchen Spacer/Aero-Holding Chambers DEVI 1 Device by Does not apply route as directed. 1 each 0  . Wheat Dextrin (BENEFIBER PO) Take 1 each by mouth daily.    . AMBULATORY NON FORMULARY MEDICATION Medication Name: Nitroglycerin ointment 0.125% use pea sized amount per rectum three times a day 30 g 1  . budesonide-formoterol (SYMBICORT) 160-4.5 MCG/ACT inhaler Inhale 2 puffs into the lungs 2 (two) times daily. 1 Inhaler 0  . oseltamivir (TAMIFLU) 75 MG capsule Take 1 capsule (75 mg total) by mouth 2 (two) times daily. 10 capsule 0  . predniSONE (DELTASONE) 10 MG tablet Take 2 tabs x 5 days 10 tablet 0   No facility-administered medications prior to visit.     Review of Systems  Review of Systems  Constitutional: Negative.   HENT: Negative.   Respiratory: Negative for cough, shortness of breath and wheezing.        Dyspnea on exertion  Cardiovascular: Negative.     Physical Exam  BP 138/72 (BP Location: Left Arm, Cuff Size: Large)   Pulse 66   Temp 98 F (36.7 C) (Oral)   Ht 5\' 4"  (1.626 m)   Wt 195 lb 6.4 oz (88.6 kg)   SpO2 96%   BMI 33.54 kg/m  Physical Exam Constitutional:      Appearance: Normal  appearance.  HENT:     Head: Normocephalic and atraumatic.  Neck:     Musculoskeletal: Normal range of motion and neck supple.  Cardiovascular:     Rate and Rhythm: Normal rate and regular rhythm.  Pulmonary:     Effort: Pulmonary effort is normal.     Breath sounds: Normal breath sounds.  Skin:    General: Skin is warm and dry.  Neurological:     General: No focal deficit present.     Mental Status: She is alert and oriented to person, place, and time. Mental status is at baseline.  Psychiatric:  Mood and Affect: Mood normal.        Behavior: Behavior normal.        Thought Content: Thought content normal.        Judgment: Judgment normal.      Lab Results:  CBC    Component Value Date/Time   WBC 8.4 08/16/2018 0941   RBC 4.29 08/16/2018 0941   HGB 13.4 08/16/2018 0941   HCT 42.0 08/16/2018 0941   PLT 251 08/16/2018 0941   MCV 97.9 08/16/2018 0941   MCH 31.2 08/16/2018 0941   MCHC 31.9 08/16/2018 0941   RDW 12.9 08/16/2018 0941   LYMPHSABS 1.9 03/17/2018 1112   MONOABS 0.7 03/17/2018 1112   EOSABS 0.3 03/17/2018 1112   BASOSABS 0.1 03/17/2018 1112    BMET    Component Value Date/Time   NA 138 08/16/2018 0941   K 4.3 08/16/2018 0941   CL 104 08/16/2018 0941   CO2 26 08/16/2018 0941   GLUCOSE 113 (H) 08/16/2018 0941   BUN 14 08/16/2018 0941   CREATININE 0.69 08/16/2018 0941   CALCIUM 10.9 (H) 08/16/2018 0941   GFRNONAA >60 08/16/2018 0941   GFRAA >60 08/16/2018 0941    BNP No results found for: BNP  ProBNP No results found for: PROBNP  Imaging: No results found.   Assessment & Plan:   ILD (interstitial lung disease) ([place]) - Spirometry is stable, DLCOunc is normal but has decreased since February - Checking Hgb to correlate  - Plan repeat HRCT in 1-2 months  - Reconsider bronchoscopy with BAL and envisia classifier after CT  - Ok to resume pulmonary rehab - FU in 3 months  Asthma - Subjective symptoms consistent with mild intermittent  asthma. Hx elevated FENO - Breathing improved with ICS/LABA - Continue Symbicort twice daily    Martyn Ehrich, NP 02/17/2019

## 2019-02-17 NOTE — Assessment & Plan Note (Signed)
-   Spirometry is stable, DLCOunc is normal but has decreased since February - Checking Hgb to correlate  - Plan repeat HRCT in 1-2 months  - Reconsider bronchoscopy with BAL and envisia classifier after CT  - Ok to resume pulmonary rehab - FU in 3 months

## 2019-02-17 NOTE — Assessment & Plan Note (Signed)
-   Subjective symptoms consistent with mild intermittent asthma. Hx elevated FENO - Breathing improved with ICS/LABA - Continue Symbicort twice daily

## 2019-02-24 ENCOUNTER — Other Ambulatory Visit: Payer: Self-pay

## 2019-02-24 DIAGNOSIS — R197 Diarrhea, unspecified: Secondary | ICD-10-CM

## 2019-02-24 MED ORDER — BUDESONIDE 3 MG PO CPEP: 9.0000 mg | ORAL_CAPSULE | Freq: Every day | ORAL | 3 refills | Status: DC

## 2019-02-28 ENCOUNTER — Ambulatory Visit (INDEPENDENT_AMBULATORY_CARE_PROVIDER_SITE_OTHER): Payer: Medicare Other | Admitting: Primary Care

## 2019-02-28 ENCOUNTER — Emergency Department (HOSPITAL_COMMUNITY): Payer: Medicare Other

## 2019-02-28 ENCOUNTER — Encounter: Payer: Self-pay | Admitting: Primary Care

## 2019-02-28 ENCOUNTER — Emergency Department (HOSPITAL_COMMUNITY)
Admission: EM | Admit: 2019-02-28 | Discharge: 2019-03-01 | Disposition: A | Discharge status: Home / Self Care | Payer: Medicare Other | Attending: Emergency Medicine | Admitting: Emergency Medicine

## 2019-02-28 ENCOUNTER — Other Ambulatory Visit: Payer: Self-pay

## 2019-02-28 ENCOUNTER — Telehealth: Payer: Self-pay | Admitting: Primary Care

## 2019-02-28 ENCOUNTER — Encounter (HOSPITAL_COMMUNITY): Payer: Self-pay | Admitting: Family Medicine

## 2019-02-28 DIAGNOSIS — Z20828 Contact with and (suspected) exposure to other viral communicable diseases: Secondary | ICD-10-CM | POA: Insufficient documentation

## 2019-02-28 DIAGNOSIS — Z79899 Other long term (current) drug therapy: Secondary | ICD-10-CM | POA: Diagnosis not present

## 2019-02-28 DIAGNOSIS — R0602 Shortness of breath: Secondary | ICD-10-CM

## 2019-02-28 DIAGNOSIS — J189 Pneumonia, unspecified organism: Secondary | ICD-10-CM

## 2019-02-28 DIAGNOSIS — J45909 Unspecified asthma, uncomplicated: Secondary | ICD-10-CM | POA: Insufficient documentation

## 2019-02-28 DIAGNOSIS — I10 Essential (primary) hypertension: Secondary | ICD-10-CM | POA: Insufficient documentation

## 2019-02-28 DIAGNOSIS — K922 Gastrointestinal hemorrhage, unspecified: Secondary | ICD-10-CM | POA: Diagnosis not present

## 2019-02-28 DIAGNOSIS — Z87891 Personal history of nicotine dependence: Secondary | ICD-10-CM | POA: Insufficient documentation

## 2019-02-28 DIAGNOSIS — K921 Melena: Secondary | ICD-10-CM | POA: Insufficient documentation

## 2019-02-28 DIAGNOSIS — Z7982 Long term (current) use of aspirin: Secondary | ICD-10-CM | POA: Diagnosis not present

## 2019-02-28 DIAGNOSIS — E039 Hypothyroidism, unspecified: Secondary | ICD-10-CM | POA: Diagnosis not present

## 2019-02-28 HISTORY — DX: Pneumonia, unspecified organism: J18.9

## 2019-02-28 LAB — CBC
HCT: 32.6 % — ABNORMAL LOW (ref 36.0–46.0)
Hemoglobin: 9.4 g/dL — ABNORMAL LOW (ref 12.0–15.0)
MCH: 24.7 pg — ABNORMAL LOW (ref 26.0–34.0)
MCHC: 28.8 g/dL — ABNORMAL LOW (ref 30.0–36.0)
MCV: 85.6 fL (ref 80.0–100.0)
Platelets: 328 10*3/uL (ref 150–400)
RBC: 3.81 MIL/uL — ABNORMAL LOW (ref 3.87–5.11)
RDW: 15.4 % (ref 11.5–15.5)
WBC: 8.5 10*3/uL (ref 4.0–10.5)
nRBC: 0 % (ref 0.0–0.2)

## 2019-02-28 LAB — VITAMIN B12: Vitamin B-12: 262 pg/mL (ref 180–914)

## 2019-02-28 LAB — COMPREHENSIVE METABOLIC PANEL
ALT: 25 U/L (ref 0–44)
AST: 25 U/L (ref 15–41)
Albumin: 4.2 g/dL (ref 3.5–5.0)
Alkaline Phosphatase: 65 U/L (ref 38–126)
Anion gap: 9 (ref 5–15)
BUN: 19 mg/dL (ref 8–23)
CO2: 25 mmol/L (ref 22–32)
Calcium: 10.2 mg/dL (ref 8.9–10.3)
Chloride: 105 mmol/L (ref 98–111)
Creatinine, Ser: 0.79 mg/dL (ref 0.44–1.00)
GFR calc Af Amer: >60 mL/min (ref >60)
GFR calc non Af Amer: >60 mL/min (ref >60)
Glucose, Bld: 109 mg/dL — ABNORMAL HIGH (ref 70–99)
Potassium: 3.7 mmol/L (ref 3.5–5.1)
Sodium: 139 mmol/L (ref 135–145)
Total Bilirubin: 0.3 mg/dL (ref 0.3–1.2)
Total Protein: 7.1 g/dL (ref 6.5–8.1)

## 2019-02-28 LAB — FOLATE: Folate: 14.8 ng/mL (ref >5.9)

## 2019-02-28 LAB — IRON AND TIBC
Iron: 17 ug/dL — ABNORMAL LOW (ref 28–170)
Saturation Ratios: 4 % — ABNORMAL LOW (ref 10.4–31.8)
TIBC: 446 ug/dL (ref 250–450)
UIBC: 429 ug/dL

## 2019-02-28 LAB — ABO/RH: ABO/RH(D): AB NEG

## 2019-02-28 LAB — OCCULT BLOOD, POC DEVICE: Fecal Occult Bld: NEGATIVE

## 2019-02-28 LAB — TYPE AND SCREEN
ABO/RH(D): AB NEG
Antibody Screen: NEGATIVE

## 2019-02-28 LAB — SARS CORONAVIRUS 2 BY RT PCR (HOSPITAL ORDER, PERFORMED IN ~~LOC~~ HOSPITAL LAB): SARS Coronavirus 2: NEGATIVE

## 2019-02-28 LAB — FERRITIN: Ferritin: 8 ng/mL — ABNORMAL LOW (ref 11–307)

## 2019-02-28 MED ORDER — ALBUTEROL SULFATE HFA 108 (90 BASE) MCG/ACT IN AERS
4.0000 | INHALATION_SPRAY | Freq: Once | RESPIRATORY_TRACT | Status: AC
  Administered 2019-02-28: 4 via RESPIRATORY_TRACT
  Filled 2019-02-28: qty 6.7

## 2019-02-28 NOTE — ED Triage Notes (Signed)
Patient is from home and experiencing shortness of breath and rectal bleeding. Also, complains of headache this past week. Patient had a tele health visit with Geraldo Pitter, NP with Pulmonary today and was directed for ED evaluation. Patient states her shortness of breath has been occurring for the last week and recently had a colitis flare up with bloody and dark stools.

## 2019-02-28 NOTE — ED Provider Notes (Signed)
[facility] DEPT Provider Note   CSN: [id number] Arrival date & time: 02/28/19  1627     History   Chief Complaint Chief Complaint  Patient presents with  . Shortness of Breath  . Rectal Bleeding    HPI [name] is a 73 y.o. female.     HPI  73 year old female with history of emphysema of the lungs comes in a chief complaint of shortness of breath.  She denies any known history of coronary artery disease.  Patient reports that over the past 3 days she has had exertional shortness of breath.  Often she starts having shortness of breath is speaking.  She also has noted off and on episodes of bloody stools.  she has known history of colitis and has had bloody stools in the past.  Review of system is negative for any new cough, fevers, chills.  Patient denies any sick exposures.  Patient also denies any chest pain.  Past Medical History:  Diagnosis Date  . Anxiety state, unspecified   . Benign neoplasm of colon   . Brain tumor ([place])   . Colon polyps    hyperplastic  . Complication of anesthesia    requests dilaudid for pain  . Complications affecting other specified body systems, hypertension    No cardiologist   . Contact lens/glasses fitting    wears glassses or contacts  . Depressive disorder, not elsewhere classified   . Dyspnea   . Esophageal reflux   . Esophageal ring   . Family history of colon cancer mother  . Fundic gland polyps of stomach, benign   . Hemorrhoids   . Hiatal hernia   . Hypertension   . Hypothyroidism   . Lymphocytic colitis   . Obesity   . Other and unspecified hyperlipidemia   . Seizures ([place])    x 1    Patient Active Problem List   Diagnosis Date Noted  . Asthma 02/17/2019  . Flu-like symptoms 09/26/2018  . ILD (interstitial lung disease) ([place]) 09/26/2018  . Tinnitus, bilateral 10/20/2016  . Excessive cerumen in both ear canals 10/20/2016  . Benign essential tremor 06/09/2016  . Benign  paroxysmal positional vertigo 06/09/2016  . History of benign brain tumor 06/09/2016  . Gait disturbance 06/09/2016  . Trimalleolar fracture of ankle, closed 04/03/2015  . Lumbar radiculopathy 04/19/2013  . DIARRHEA 09/15/2010  . PERSONAL HISTORY OF FAILED MODERATE SEDATION 09/15/2010  . DIARRHEA-PRESUMED INFECTIOUS 07/24/2010  . HEMORRHOIDS-INTERNAL 12/10/2008  . HEMORRHOIDS-EXTERNAL 12/10/2008  . PERSONAL HX COLONIC POLYPS 12/10/2008  . COLONIC POLYPS, HYPERPLASTIC 11/22/2008  . HYPERLIPIDEMIA 11/22/2008  . ANXIETY 11/22/2008  . DEPRESSION 11/22/2008  . ABNORMAL HEART RHYTHMS 11/22/2008  . HEMORRHOIDS 11/22/2008  . GERD 11/22/2008  . Diaphragmatic hernia 11/22/2008  . HYPERTENSION NEC 11/22/2008    Past Surgical History:  Procedure Laterality Date  . ABDOMINAL HYSTERECTOMY    . APPENDECTOMY    . BACK SURGERY    . BRAIN SURGERY  1999   benign tumor  . CARPAL TUNNEL RELEASE     right  . CATARACT EXTRACTION, BILATERAL  04/2018  . CRANIOTOMY    . EYE SURGERY    . HAMMERTOE RECONSTRUCTION WITH WEIL OSTEOTOMY  07/07/2012   Procedure: HAMMERTOE RECONSTRUCTION WITH WEIL OSTEOTOMY;  Surgeon: Wylene Simmer, MD;  Location: [facility];  Service: Orthopedics;  Laterality: Left;  Left Second through [place]; Left Fifth Toe Flexor Tendon Release, and Left Second through [place] Osteotomy  . ORIF  ANKLE FRACTURE Left 04/04/2015   Procedure: OPEN REDUCTION INTERNAL FIXATION (ORIF) LEFT ANKLE FRACTURE;  Surgeon: Wylene Simmer, MD;  Location: [facility];  Service: Orthopedics;  Laterality: Left;     OB History   No obstetric history on file.      Home Medications    Prior to Admission medications   Medication Sig Start Date End Date Taking? Authorizing Provider  Ascorbic Acid (VITAMIN C) 1000 MG tablet Take 1,000 mg by mouth daily.     Yes [provider]  aspirin 81 MG tablet Take 81 mg by mouth daily.    Yes [provider]  budesonide  (ENTOCORT EC) 3 MG 24 hr capsule Take 3 capsules (9 mg total) by mouth daily. Take 3,  3mg  tabs in morning as needed Patient taking differently: Take 9 mg by mouth daily as needed (flare up).  02/24/19  Yes Esterwood, Amy S, PA-C  budesonide-formoterol (SYMBICORT) 160-4.5 MCG/ACT inhaler Inhale 2 puffs into the lungs 2 (two) times daily. 02/17/19  Yes Martyn Ehrich, NP  calcium-vitamin D (OSCAL WITH D) 500-200 MG-UNIT tablet Take 1 tablet by mouth daily.    Yes [provider]  cetirizine (ZYRTEC) 10 MG tablet Take 10 mg by mouth daily.   Yes [provider]  [employer] HYDROCORTISONE ACETATE 0.5 % cream APPLY TO AFFECTED AREA TOPICALLY AS NEEDED FOR HEMORRHOIDS Patient taking differently: Apply 1 application topically daily as needed (for hemorrhoids).  02/10/16  Yes Irene Shipper, MD  diphenoxylate-atropine (LOMOTIL) 2.5-0.025 MG tablet Take 1 tab by mouth as needed. Patient taking differently: Take 1 tablet by mouth as needed for diarrhea or loose stools.  03/17/18  Yes Esterwood, Amy S, PA-C  famotidine (PEPCID) 20 MG tablet TAKE 1 TABLET BY MOUTH EVERYDAY AT BEDTIME Patient taking differently: Take 20 mg by mouth at bedtime.  02/02/19  Yes Nandigam, Venia Minks, MD  fluticasone (FLONASE) 50 MCG/ACT nasal spray Place 2 sprays into both nostrils daily.    Yes [provider]  gabapentin (NEURONTIN) 300 MG capsule Take 300 mg by mouth at bedtime. 11/29/18  Yes [provider]  hydrochlorothiazide (HYDRODIURIL) 25 MG tablet Take 25 mg by mouth daily. 10/03/18  Yes [provider]  levothyroxine (SYNTHROID) 125 MCG tablet Take 125 mcg by mouth daily. 12/08/18  Yes [provider]  Omega-3 Fatty Acids (FISH OIL PO) Take 1 capsule by mouth daily.    Yes [provider]  pantoprazole (PROTONIX) 40 MG tablet TAKE 1 TABLET BY MOUTH EVERY DAY Patient taking differently: Take 40 mg by mouth daily.  02/01/19  Yes Nandigam, Venia Minks, MD  pravastatin (PRAVACHOL)  40 MG tablet Take 40 mg by mouth daily.   Yes [provider]  sertraline (ZOLOFT) 100 MG tablet Take 150 mg by mouth daily. 11/29/18  Yes [provider]  Wheat Dextrin (BENEFIBER PO) Take 1 each by mouth daily.   Yes [provider]  Respiratory Therapy Supplies (FLUTTER) DEVI 1 Device by Does not apply route as directed. 09/26/18   Martyn Ehrich, NP  Spacer/Aero-Holding Josiah Lobo DEVI 1 Device by Does not apply route as directed. 10/14/18   Brand Males, MD    Family History Family History  Problem Relation Age of Onset  . Breast cancer Mother   . Colon cancer Mother   . Lung cancer Maternal Uncle   . Bone cancer Maternal Grandmother   . Heart attack Father     Social History Social History   Tobacco  Use  . Smoking status: Former Smoker    Packs/day: 0.50    Years: 20.00    Pack years: 10.00    Quit date: 09/02/2010    Years since quitting: 8.4  . Smokeless tobacco: Never Used  Substance Use Topics  . Alcohol use: Yes    Alcohol/week: 7.0 standard drinks    Types: 7 Standard drinks or equivalent per week    Comment: intermittently 1 martini daily on most days  . Drug use: No     Allergies   Codeine, Hydrocod polst-cpm polst er, Hydrocodone-acetaminophen, Sulfa antibiotics, and Sulfonamide derivatives   Review of Systems Review of Systems  Constitutional: Positive for activity change.  Respiratory: Positive for shortness of breath.   Cardiovascular: Negative for chest pain.  Gastrointestinal: Positive for blood in stool. Negative for nausea and vomiting.  Allergic/Immunologic: Negative for immunocompromised state.  Hematological: Does not bruise/bleed easily.  All other systems reviewed and are negative.    Physical Exam Updated Vital Signs BP (!) 145/68 (BP Location: Left Arm)   Pulse 88   Temp 99 F (37.2 C) (Oral)   Resp 20   Ht 5\' 4"  (1.626 m)   Wt 86.6 kg   SpO2 97%   BMI 32.79 kg/m   Physical Exam Vitals signs and  nursing note reviewed.  Constitutional:      Appearance: She is well-developed.  HENT:     Head: Normocephalic and atraumatic.  Neck:     Musculoskeletal: Normal range of motion and neck supple.  Cardiovascular:     Rate and Rhythm: Normal rate.  Pulmonary:     Effort: Pulmonary effort is normal. Tachypnea present.     Breath sounds: No decreased breath sounds, wheezing, rhonchi or rales.  Abdominal:     General: Bowel sounds are normal.     Comments: DRE reveals no melena  Skin:    General: Skin is warm and dry.  Neurological:     Mental Status: She is alert and oriented to person, place, and time.      ED Treatments / Results  Labs (all labs ordered are listed, but only abnormal results are displayed) Labs Reviewed  COMPREHENSIVE METABOLIC PANEL - Abnormal; Notable for the following components:      Result Value   Glucose, Bld 109 (*)    All other components within normal limits  CBC - Abnormal; Notable for the following components:   RBC 3.81 (*)    Hemoglobin 9.4 (*)    HCT 32.6 (*)    MCH 24.7 (*)    MCHC 28.8 (*)    All other components within normal limits  IRON AND TIBC - Abnormal; Notable for the following components:   Iron 17 (*)    Saturation Ratios 4 (*)    All other components within normal limits  FERRITIN - Abnormal; Notable for the following components:   Ferritin 8 (*)    All other components within normal limits  SARS CORONAVIRUS 2 (HOSPITAL ORDER, [facility])  VITAMIN B12  FOLATE  RETICULOCYTES  POC OCCULT BLOOD, ED  OCCULT BLOOD, POC DEVICE  TYPE AND SCREEN  ABO/RH    EKG EKG Interpretation  Date/Time:  Tuesday February 28 2019 17:06:55 EDT Ventricular Rate:  81 PR Interval:    QRS Duration: 107 QT Interval:  364 QTC Calculation: 423 R Axis:   8 Text Interpretation:  Sinus rhythm Borderline T wave abnormalities No acute changes No significant change since last tracing Confirmed  by Varney Biles [phone]) on  02/28/2019 8:29:36 PM   Radiology Dg Chest 2 View  Result Date: 02/28/2019 CLINICAL DATA:  Shortness of breath. Rectal bleeding. EXAM: CHEST - 2 VIEW COMPARISON:  September 26, 2018 FINDINGS: Cardiomediastinal silhouette is normal. Mediastinal contours appear intact. Subtle scattered ground-glass opacities in bilateral lungs, most prominent in the left lower lobe. Osseous structures are without acute abnormality. Soft tissues are grossly normal. IMPRESSION: Subtle scattered ground-glass opacities in bilateral lungs, most prominent in the left lower lobe. Findings are concerning for atypical/viral pneumonia. Electronically Signed   By: Fidela Salisbury M.D.   On: 02/28/2019 18:54    Procedures Procedures (including critical care time)  Medications Ordered in ED Medications  albuterol (VENTOLIN HFA) 108 (90 Base) MCG/ACT inhaler 4 puff (4 puffs Inhalation Given 02/28/19 2202)     Initial Impression / Assessment and Plan / ED Course  I have reviewed the triage vital signs and the nursing notes.  Pertinent labs & imaging results that were available during my care of the patient were reviewed by me and considered in my medical decision making (see chart for details).        73 year old female history of emphysema comes in a chief complaint of shortness of breath and rectal bleeding.  Patient has had off-and-on bloody stools.  She has history of colitis and hemorrhoids.  Digital rectal exam did not reveal any melena or bright red blood per rectum.  She is also guaiac negative.  Baseline hemoglobin appears to be 13, but it was 9 last month and the repeat CBC today also shows hemoglobin 9.  She is certainly has had no globin drop and we will send anemia panel while she is here.  As far as her shortness of breath is concerned, I doubt that the anemia is the driving factor for it.  She has history of emphysema.  Lung exam overall is reassuring.  Chest x-ray ordered and COVID-19 test ordered.  No  evidence of CHF on her exam.  11:17 PM Patient's x-ray is reassuring.  She is awaiting COVID-19 test. Intake test is negative she will be discharged.  I have already ambulated the patient and she was not hypoxic with ambulation.  She is comfortable with follow-up with the PCP.   [name] was evaluated in Emergency Department on 02/28/2019 for the symptoms described in the history of present illness. She was evaluated in the context of the global COVID-19 pandemic, which necessitated consideration that the patient might be at risk for infection with the SARS-CoV-2 virus that causes COVID-19. Institutional protocols and algorithms that pertain to the evaluation of patients at risk for COVID-19 are in a state of rapid change based on information released by regulatory bodies including the CDC and federal and state organizations. These policies and algorithms were followed during the patient's care in the ED.   Final Clinical Impressions(s) / ED Diagnoses   Final diagnoses:  None    ED Discharge Orders    None       Varney Biles, MD 02/28/19 2317

## 2019-02-28 NOTE — ED Provider Notes (Signed)
22:30: Assumed care of patient from Dr. Kathrynn Humble @ change of shift pending COVID testing- plan @ change of shift is if covid negative- discharge home with azithromycin, covid + may require admission.    Please see prior provider note for full H&P.  Briefly patient is a 73 yo female who presented to the ED with 3 day hx of dyspnea.  She is not hypoxic in the ED. Prior provider ambulated w/o significant desaturation.  She does not appear in respiratory distress.   COVID testing negative.  Will discharge home with Azithromycin per Dr. Kathrynn Humble.  I discussed results, treatment plan, need for follow-up, and return precautions with the patient. Provided opportunity for questions, patient confirmed understanding and is in agreement with plan.    Amaryllis Dyke, PA-C 03/01/19 0041    Varney Biles, MD 03/01/19 1718

## 2019-02-28 NOTE — Telephone Encounter (Signed)
Called and spoke with patient, Patient having a colitis flare up and is working with MDs for that, but patient cannot do simple tasks or walk around house without bring hard.  Patient denise wheezing, cough, sore throat and fever.  Offered a mychart visit with provider Patient said yes. Patient has seen BW before. Scheduled today 02/28/19 at 1530  Nothing further needed

## 2019-02-28 NOTE — Progress Notes (Signed)
Virtual Visit via Telephone Note  I connected with [name] on 02/28/19 at  3:30 PM EDT by telephone and verified that I am speaking with the correct person using two identifiers.  Location: Patient: Home Provider: Office   I discussed the limitations, risks, security and privacy concerns of performing an evaluation and management service by telephone and the availability of in person appointments. I also discussed with the patient that there may be a patient responsible charge related to this service. The patient expressed understanding and agreed to proceed.   History of Present Illness: 73 year old female, former smoker. PMH significant for interstitial lung disease (unclear cause). Patient of Dr. Vaughan Browner, last seen on 09/22/18. Spirometry and labs normal. Started on Symbicort 160 and referred to pulmonary rehab. CXR 09/26/18 showed chronic interstitial changes without interval change. Recommended pulmonary rehab. Cancelled bronchoscopy d/t covid concerns. PFTs rescheduled.   Previous [facility] pulmonary encounter: 02/17/2019 Patient presents today for spirometry with DLCO. Accompanied by husband by phone call. She is feeling well, thinks her breathing has improved since using Symbicort. Shortness of breath hasn't seemed to bother her much. Reports dyspnea on exertion only. Spirometry is stable, DLCO is normal but has decreased since February. Will check hemoglobin to correlate. Denies cough, wheezing.   02/28/2019 Patient contacted today by televisit for acute visit with complaints shortness of breath x 1 week. Continues Symbicort twice daily. States that her breathing has worsened since last visit. She can hardly walk without becoming short of breath. Recent colitis flare, having bloody/dark stools. Last BM was 2-3 days ago and was diarrhea mixed with blood. HGB on 7/24 was 9.9. She has an apt with GI on Friday 12/01/18.   Observations/Objective:  Alert and oriented Mild-moderate shortness of  breath  Assessment and Plan:  Shortness of breath - Worsening shortness of breath over the last week  - Llikely from GIB - Needs ED evaluation, patient agreeing- husband to drive her to [facility].   GIB - Recent colitis flare with reports bright red/dark stools - HGB 9.9 on 7/24 - Needs ED eval for labs and may need blood transfusion   Follow Up Instructions:   As needed, will call to check on patient this week   I discussed the assessment and treatment plan with the patient. The patient was provided an opportunity to ask questions and all were answered. The patient agreed with the plan and demonstrated an understanding of the instructions.   The patient was advised to call back or seek an in-person evaluation if the symptoms worsen or if the condition fails to improve as anticipated.  I provided 18 minutes of non-face-to-face time during this encounter.   Martyn Ehrich, NP

## 2019-03-01 ENCOUNTER — Other Ambulatory Visit: Payer: Medicare Other

## 2019-03-01 ENCOUNTER — Telehealth: Payer: Self-pay | Admitting: Physician Assistant

## 2019-03-01 DIAGNOSIS — R197 Diarrhea, unspecified: Secondary | ICD-10-CM

## 2019-03-01 MED ORDER — AZITHROMYCIN 250 MG PO TABS: 250.0000 mg | ORAL_TABLET | Freq: Every day | ORAL | 0 refills | Status: DC

## 2019-03-01 NOTE — Discharge Instructions (Addendum)
You were seen in the emergency department today for trouble breathing and rectal bleeding.  Your work-up in the ER was overall reassuring.  Your blood work appeared similar to prior labs you have had performed.  Your chest x-ray did show findings concerning for pneumonia.  Your coronavirus swab was negative.  We are treating your pneumonia for with azithromycin, this is an antibiotic, please take this as prescribed.  Please take all of your antibiotics until finished. You may develop abdominal discomfort or diarrhea from the antibiotic.  You may help offset this with probiotics which you can buy at the store (ask your pharmacist if unable to find) or get probiotics in the form of eating yogurt. Do not eat or take the probiotics until 2 hours after your antibiotic. If you are unable to tolerate these side effects follow-up with your primary care provider or return to the emergency department.   If you begin to experience any blistering, rashes, swelling, or difficulty breathing seek medical care for evaluation of potentially more serious side effects.   Please be aware that this medication may interact with other medications you are taking, please be sure to discuss your medication list with your pharmacist.   We would like you to follow-up closely with your primary care provider within 3 days for reevaluation.  Please return to the emergency department for new or worsening symptoms including but not limited to worsening shortness of breath, fever, inability to keep fluids down, chest pain, passing out, or any other concerns.

## 2019-03-01 NOTE — Telephone Encounter (Signed)
Pt said that she was able to produce stool sample today, she wants to know if Amy still wants to see her on Friday or is she should r/s until results from stool test are back.

## 2019-03-02 NOTE — Telephone Encounter (Signed)
Ok thanks 

## 2019-03-02 NOTE — Telephone Encounter (Signed)
Stools are soft unformed but not liquid. She has developed pneumonia and is on Zithromax. She is cancelling tomorrow. Has follow up with Dr [name] on 03/27/19.

## 2019-03-03 ENCOUNTER — Ambulatory Visit: Payer: Medicare Other | Admitting: Gastroenterology

## 2019-03-03 LAB — GASTROINTESTINAL PATHOGEN PANEL PCR
C. difficile Tox A/B, PCR: NOT DETECTED
Campylobacter, PCR: NOT DETECTED
Cryptosporidium, PCR: NOT DETECTED
E coli (ETEC) LT/ST PCR: NOT DETECTED
E coli (STEC) stx1/stx2, PCR: NOT DETECTED
E coli 0157, PCR: NOT DETECTED
Giardia lamblia, PCR: NOT DETECTED
Norovirus, PCR: NOT DETECTED
Rotavirus A, PCR: NOT DETECTED
Salmonella, PCR: NOT DETECTED
Shigella, PCR: NOT DETECTED

## 2019-03-08 ENCOUNTER — Telehealth: Payer: Self-pay | Admitting: Physician Assistant

## 2019-03-08 NOTE — Telephone Encounter (Signed)
Pt called to inform that diarrhea stopped for 5 days but reoccurred yesterday. She is going to began taking entocort today and wants to know if Amy has other suggestions.

## 2019-03-08 NOTE — Telephone Encounter (Signed)
Notes recorded by Algernon Huxley, RN on 03/08/2019 at 10:31 AM EDT  Spoke with pt and she is aware. States the diarrhea had stopped for about 5 days but started back with 1 diarrhea stool yesterday and 2 today. Pt will take some lomotil and call back if she continues to have diarrhea.

## 2019-03-23 ENCOUNTER — Telehealth: Payer: Self-pay | Admitting: Primary Care

## 2019-03-23 NOTE — Telephone Encounter (Signed)
Spoke with the pt  She states she was advised that she will need to have a knee replacement surgery  She is concerned about having a spinal block done and then having to take pain meds  She is wanting to know what Eustaquio Maize thinks about this  I offered to send the msg to Dr. Chase Caller, but she feels more comfortable hearing from [facility] since she has been seen by her most recent  She is aware Eustaquio Maize out of the office this pm and ok with call back tomorrow  Please advise, thanks!

## 2019-03-24 NOTE — Telephone Encounter (Signed)
In my opinion it is safe for her to proceed with knee surgery espescially because it will not be under general anesthesia and will be a spinal block. As long as she has recovered from bronchitis/CAP in early August. If she is still having increased shortness of breath or cough should have visit before procedure.   Her PFTs are reassuring, FEV1 2.04 (98%), ratio 83. Continue all inhalers as prescribed.   Post-op it is important for her to ambulate as soon as possible, get up and out of bed, use incentive spirometry and take deep breaths. Pain medication per orthopedics is ok.

## 2019-03-24 NOTE — Telephone Encounter (Signed)
Called pt and relayed message per BW and recommendation to proceed with surgery. Pt will call back to schedule a quick f/u before surgery She has appt today and will find out surgery date. Nothing further needed.

## 2019-03-27 ENCOUNTER — Ambulatory Visit (INDEPENDENT_AMBULATORY_CARE_PROVIDER_SITE_OTHER): Payer: Medicare Other | Admitting: Internal Medicine

## 2019-03-27 ENCOUNTER — Encounter: Payer: Self-pay | Admitting: Internal Medicine

## 2019-03-27 VITALS — BP 144/62 | HR 88 | Temp 96.7°F | Ht 64.0 in | Wt 196.0 lb

## 2019-03-27 DIAGNOSIS — D509 Iron deficiency anemia, unspecified: Secondary | ICD-10-CM

## 2019-03-27 DIAGNOSIS — Z860100 Personal history of colon polyps, unspecified: Secondary | ICD-10-CM

## 2019-03-27 DIAGNOSIS — K52832 Lymphocytic colitis: Secondary | ICD-10-CM | POA: Diagnosis not present

## 2019-03-27 DIAGNOSIS — K219 Gastro-esophageal reflux disease without esophagitis: Secondary | ICD-10-CM

## 2019-03-27 MED ORDER — IRON (FERROUS SULFATE) 325 (65 FE) MG PO TABS: 1.0000 | ORAL_TABLET | Freq: Two times a day (BID) | ORAL | 3 refills | Status: AC

## 2019-03-27 MED ORDER — NA SULFATE-K SULFATE-MG SULF 17.5-3.13-1.6 GM/177ML PO SOLN: 1.0000 | Freq: Once | ORAL | 0 refills | Status: AC

## 2019-03-27 MED ORDER — BUDESONIDE 3 MG PO CPEP: 9.0000 mg | ORAL_CAPSULE | Freq: Every day | ORAL | 3 refills | Status: AC

## 2019-03-27 MED ORDER — DIPHENOXYLATE-ATROPINE 2.5-0.025 MG PO TABS: ORAL_TABLET | ORAL | 3 refills | Status: AC

## 2019-03-27 NOTE — Patient Instructions (Signed)
We have sent the following medications to your pharmacy for you to pick up at your convenience:  Budesonide, Lomotil, Iron  You have been scheduled for an endoscopy and colonoscopy. Please follow the written instructions given to you at your visit today. Please pick up your prep supplies at the pharmacy within the next 1-3 days. If you use inhalers (even only as needed), please bring them with you on the day of your procedure.

## 2019-03-27 NOTE — Progress Notes (Addendum)
HISTORY OF PRESENT ILLNESS:  [name] is a 73 y.o. female with multiple medical problems as listed below.  She has been followed in this office for GERD, symptomatic hemorrhoids, lymphocytic colitis, adenomatous colon polyps and a family history of colon cancer, and iron deficiency anemia.  She presents today with chief complaints of recurrent diarrhea presumed secondary to recurrent lymphocytic colitis as well as recurrent iron deficiency anemia.  She is accompanied by her husband.  She has a history of unspecified pulmonary fibrosis not requiring oxygen therapy and recurrent pneumonia, this year.  Saw pulmonary for shortness of breath and was found to be significantly anemic.  Blood work from February 28, 2019 revealed hemoglobin of 9.4.  Hemoglobin 7 months earlier was normal at 13.4.  Her most recent iron saturation was 4% and ferritin level 8.  She did undergo Hemoccult testing which was normal.  B12 and folate levels were normal.  Her husband recommended that she take an iron supplement which she has done on a limited basis.  Review of her record shows that the patient did undergo both colonoscopy and upper endoscopy January 2016 to evaluate iron deficiency anemia.  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  Duodenal biopsies were normal.  Colonoscopy (to evaluate iron deficiency anemia as well as a history of adenomatous colon polyps and family history of colon cancer in her mother (27s)) revealed 2 small tubular adenomas which were removed.  Otherwise normal long-term iron supplementation recommended.  Some point patient stopped taking iron.  She did tolerate iron well.  Earlier this year in late last year she underwent hemorrhoidal banding with Dr. Silverio Decamp.  This went well.  In July she had recurrent diarrhea for which budesonide was re-started at 9 mg daily.   Her diarrhea is now resolved for the most part.  She did undergo a GI pathogen panel which was negative or normal.  She does use Lomotil as needed.  She requests a refill.  For GERD she is maintained on Pepcid and Protonix.  She does take a baby aspirin.  Testing for COVID on 2 occasions has been negative.  She tells me that she is anticipating knee replacement surgery sometime this fall.  Review of x-ray file shows chest x-ray February 28, 2019 with possible appearance for atypical pneumonia.  REVIEW OF SYSTEMS:  All non-GI ROS negative unless otherwise stated in the HPI except for anxiety, arthritis, back pain, visual change, cough, fatigue, headaches, hearing problems, muscle cramps, leg swelling, excessive urination, urinary leakage  Past Medical History:  Diagnosis Date  . Anxiety state, unspecified   . Benign neoplasm of colon   . Brain tumor ([place])   . Colon polyps    hyperplastic  . Complication of anesthesia    requests dilaudid for pain  . Complications affecting other specified body systems, hypertension    No cardiologist   . Contact lens/glasses fitting    wears glassses or contacts  . Depressive disorder, not elsewhere classified   . Dyspnea   . Esophageal reflux   . Esophageal ring   . Family history of colon cancer mother  . Fundic gland polyps of stomach, benign   . Hemorrhoids   . Hiatal hernia   . Hypertension   . Hypothyroidism   . Lymphocytic colitis   . Obesity   . Other and unspecified hyperlipidemia   . Pulmonary fibrosis (  [place])   . Seizures ([place])    x 1    Past Surgical History:  Procedure Laterality Date  . ABDOMINAL HYSTERECTOMY    . APPENDECTOMY    . BACK SURGERY    . BRAIN SURGERY  1999   benign tumor  . CARPAL TUNNEL RELEASE     right  . CATARACT EXTRACTION, BILATERAL  04/2018  . CRANIOTOMY    . EYE SURGERY    . HAMMERTOE RECONSTRUCTION WITH WEIL OSTEOTOMY  07/07/2012   Procedure: HAMMERTOE RECONSTRUCTION WITH WEIL OSTEOTOMY;  Surgeon: Wylene Simmer, MD;  Location: [facility];  Service: Orthopedics;  Laterality: Left;  Left Second through [place]; Left Fifth Toe Flexor Tendon Release, and Left Second through [place] Osteotomy  . ORIF ANKLE FRACTURE Left 04/04/2015   Procedure: OPEN REDUCTION INTERNAL FIXATION (ORIF) LEFT ANKLE FRACTURE;  Surgeon: Wylene Simmer, MD;  Location: [facility];  Service: Orthopedics;  Laterality: Left;    Social History [name]  reports that she quit smoking about 8 years ago. She has a 10.00 pack-year smoking history. She has never used smokeless tobacco. She reports current alcohol use of about 7.0 standard drinks of alcohol per week. She reports that she does not use drugs.  family history includes Bone cancer in her maternal grandmother; Breast cancer in her mother; Colon cancer in her mother; Heart attack in her father; Lung cancer in her maternal uncle.  Allergies  Allergen Reactions  . Codeine Other (See Comments)    nightmares  . Hydrocod Polst-Cpm Polst Er Itching  . Hydrocodone-Acetaminophen Itching and Other (See Comments)    Has to take benadryl with to tolerate  . Sulfa Antibiotics Rash  . Sulfonamide Derivatives Rash       PHYSICAL EXAMINATION: Vital signs: BP (!) 144/62   Pulse 88   Ht 5\' 4"  (1.626 m)   Wt 196 lb (88.9 kg)   BMI 33.64 kg/m   Constitutional: Pleasant, generally well-appearing, no acute distress Psychiatric: alert and oriented x3, cooperative Eyes: extraocular movements intact, anicteric, conjunctiva pink Mouth: oral pharynx moist, no lesions Neck: supple no lymphadenopathy Cardiovascular: heart regular rate and rhythm, no murmur Lungs: clear to auscultation bilaterally Abdomen: soft, obese, nontender, nondistended, no obvious ascites, no peritoneal signs, normal bowel sounds, no organomegaly Rectal: Deferred until colonoscopy Extremities: no clubbing, cyanosis, or lower extremity edema bilaterally Skin: no lesions on  visible extremities Neuro: No focal deficits.  Cranial nerves intact  ASSESSMENT:  1.  Lymphocytic colitis.  This likely explains her recent problems with recurrent persistent diarrhea which has responded to budesonide therapy 2.  Recurrent iron deficiency anemia.  Rule out interval GI mucosal lesion.  May be due to [place].  Possibly intermittent Cameron erosions from her hiatal hernia.  Small bowel AVMs are also a possibility. 3.  Personal history of multiple adenomatous colon polyps.  Essentially due for follow-up 4.  Family history of colon cancer in her mother-50s 58.  History of symptomatic hemorrhoids status post in office banding with Dr. Silverio Decamp  PLAN:  1.  Schedule colonoscopy to provide colorectal neoplasia surveillance and evaluate recurrent iron deficiency anemia.  Patient is high risk given her comorbidities.The nature of the procedure, as well as the risks, benefits, and alternatives were carefully and thoroughly reviewed with the patient. Ample time for discussion and questions allowed. The patient understood, was satisfied, and agreed to proceed.  She has been instructed to hold her iron 5 days prior to the procedures 2.  Schedule upper endoscopy with biopsies to evaluate recurrent iron deficiency anemia.  Patient is high risk given her comorbidities.The nature of the procedure, as well as the risks, benefits, and alternatives were carefully and thoroughly reviewed with the patient. Ample time for discussion and questions allowed. The patient understood, was satisfied, and agreed to proceed. 3.  Prescribe iron sulfate 325 mg twice daily. 4.  Continue budesonide 9 mg daily.  I would keep her on this dosage for 4 to 6 weeks before initiating a taper. 5.  Refill prescription of budesonide. 6.  Refill prescription of Lomotil 7.  Ongoing general medical care with primary care physician

## 2019-03-28 ENCOUNTER — Telehealth: Payer: Self-pay | Admitting: Primary Care

## 2019-03-28 NOTE — Telephone Encounter (Signed)
Call returned to patient, made aware surgical clearance form has been filled out and I will be faxing back to Dr. Alvan Dame before the end of the day. Voiced understanding. Patient was instructed to take her cpap with her the day of surgery in even of any post-op complications. Voiced understanding. Will fax paper work and send to scan to be scanned into chart. Nothing further needed at this time.

## 2019-04-05 ENCOUNTER — Other Ambulatory Visit: Payer: Self-pay

## 2019-04-05 ENCOUNTER — Ambulatory Visit (HOSPITAL_BASED_OUTPATIENT_CLINIC_OR_DEPARTMENT_OTHER)
Admission: RE | Admit: 2019-04-05 | Discharge: 2019-04-05 | Disposition: A | Discharge status: Home / Self Care | Payer: Medicare Other | Source: Ambulatory Visit | Attending: Primary Care | Admitting: Primary Care

## 2019-04-05 DIAGNOSIS — J849 Interstitial pulmonary disease, unspecified: Secondary | ICD-10-CM | POA: Diagnosis present

## 2019-04-06 ENCOUNTER — Telehealth: Payer: Self-pay

## 2019-04-06 ENCOUNTER — Telehealth: Payer: Self-pay | Admitting: Internal Medicine

## 2019-04-06 NOTE — Telephone Encounter (Signed)
Covid-19 screening questions   Do you now or have you had a fever in the last 14 days? NO   Do you have any respiratory symptoms of shortness of breath or cough now or in the last 14 days? NO, just the normal allergies.   Do you have any family members or close contacts with diagnosed or suspected Covid-19 in the past 14 days? NO   Have you been tested for Covid-19 and found to be positive? NO

## 2019-04-06 NOTE — Telephone Encounter (Signed)
Pt requested a call back to discuss EGD/col tomorrow.

## 2019-04-06 NOTE — Telephone Encounter (Signed)
Pt.was concerned because she ate a salad  On Tuesday and Wednesday,instructed pt. Not to eat any more salads and drink plenty of fluids and to continue to follow instructions for preparing for her procedures for tomorrow,she verbalize understanding.

## 2019-04-07 ENCOUNTER — Encounter: Payer: Self-pay | Admitting: Internal Medicine

## 2019-04-07 ENCOUNTER — Other Ambulatory Visit: Payer: Self-pay

## 2019-04-07 ENCOUNTER — Ambulatory Visit (AMBULATORY_SURGERY_CENTER): Payer: Medicare Other | Admitting: Internal Medicine

## 2019-04-07 VITALS — BP 147/72 | HR 68 | Temp 98.0°F | Resp 20 | Ht 64.0 in | Wt 196.0 lb

## 2019-04-07 DIAGNOSIS — K449 Diaphragmatic hernia without obstruction or gangrene: Secondary | ICD-10-CM | POA: Diagnosis not present

## 2019-04-07 DIAGNOSIS — D122 Benign neoplasm of ascending colon: Secondary | ICD-10-CM | POA: Diagnosis not present

## 2019-04-07 DIAGNOSIS — K5520 Angiodysplasia of colon without hemorrhage: Secondary | ICD-10-CM | POA: Diagnosis not present

## 2019-04-07 DIAGNOSIS — D120 Benign neoplasm of cecum: Secondary | ICD-10-CM

## 2019-04-07 DIAGNOSIS — K219 Gastro-esophageal reflux disease without esophagitis: Secondary | ICD-10-CM

## 2019-04-07 DIAGNOSIS — K52832 Lymphocytic colitis: Secondary | ICD-10-CM

## 2019-04-07 DIAGNOSIS — R197 Diarrhea, unspecified: Secondary | ICD-10-CM | POA: Diagnosis not present

## 2019-04-07 DIAGNOSIS — D509 Iron deficiency anemia, unspecified: Secondary | ICD-10-CM

## 2019-04-07 DIAGNOSIS — K317 Polyp of stomach and duodenum: Secondary | ICD-10-CM | POA: Diagnosis not present

## 2019-04-07 MED ORDER — SODIUM CHLORIDE 0.9 % IV SOLN: 500.0000 mL | Freq: Once | INTRAVENOUS | Status: DC

## 2019-04-07 NOTE — Op Note (Signed)
[facility] Patient Name: [name] Procedure Date: 04/07/2019 8:06 AM MRN: [id number] Endoscopist: Docia Chuck. [name] , MD Age: 73 Referring MD:  Date of Birth: 11-04-1945 Gender: Female Account #: [id number] Procedure:                Colonoscopy with cold snare polypectomy x 2; with                            biopsies Indications:              Iron deficiency anemia; personal history of                            adenomatous colon polyps. Mother with colon cancer                            in 21s. Previous colonoscopies 2009, 2012, 2016                            recent problems with diarrhea improved with                            budesonide. History of microscopic colitis Medicines:                Monitored Anesthesia Care Procedure:                Pre-Anesthesia Assessment:                           - Prior to the procedure, a History and Physical                            was performed, and patient medications and                            allergies were reviewed. The patient's tolerance of                            previous anesthesia was also reviewed. The risks                            and benefits of the procedure and the sedation                            options and risks were discussed with the patient.                            All questions were answered, and informed consent                            was obtained. Prior Anticoagulants: The patient has                            taken no previous anticoagulant or antiplatelet  agents. ASA Grade Assessment: III - A patient with                            severe systemic disease. After reviewing the risks                            and benefits, the patient was deemed in                            satisfactory condition to undergo the procedure.                           After obtaining informed consent, the colonoscope                            was passed under direct vision.  Throughout the                            procedure, the patient's blood pressure, pulse, and                            oxygen saturations were monitored continuously. The                            Colonoscope was introduced through the anus and                            advanced to the the cecum, identified by                            appendiceal orifice and ileocecal valve. The                            ileocecal valve, appendiceal orifice, and rectum                            were photographed. The quality of the bowel                            preparation was excellent. The colonoscopy was                            performed without difficulty. The patient tolerated                            the procedure well. The bowel preparation used was                            SUPREP via split dose instruction. Scope In: 8:28:59 AM Scope Out: 8:50:14 AM Scope Withdrawal Time: 0 hours 13 minutes 51 seconds  Total Procedure Duration: 0 hours 21 minutes 15 seconds  Findings:                 Two polyps were found in the ascending colon and  cecum. The polyps were 1 to 5 mm in size. These                            polyps were removed with a cold snare. Resection                            and retrieval were complete.                           Internal hemorrhoids were found during                            retroflexion. The hemorrhoids were large and                            inflamed.                           There was a small ascending colon AVM which was                            nonbleeding. The exam was otherwise without                            abnormality on direct and retroflexion views.                            Random colon biopsies were taken to rule out                            lymphocytic colitis. Complications:            No immediate complications. Estimated blood loss:                            None. Estimated Blood Loss:      Estimated blood loss: none. Impression:               - Two 1 to 5 mm polyps in the ascending colon and                            in the cecum, removed with a cold snare. Resected                            and retrieved.                           - Internal hemorrhoids (large and inflamed).                           - Small ascending colon AVM                           - The examination was otherwise normal on direct  and retroflexion views. Recommendation:           - Repeat colonoscopy in 5 years for surveillance.                           - Patient has a contact number available for                            emergencies. The signs and symptoms of potential                            delayed complications were discussed with the                            patient. Return to normal activities tomorrow.                            Written discharge instructions were provided to the                            patient.                           - Resume previous diet.                           - Continue present medications.                           - Await pathology results.                           - Could consider repeat banding procedure for                            hemorrhoids if symptomatic                           - See EGD report regarding findings and final                            recommendations                           - Office follow-up with Dr. [name] in 6 to 8 weeks Docia Chuck. [name], MD 04/07/2019 9:22:29 AM This report has been signed electronically.

## 2019-04-07 NOTE — Progress Notes (Signed)
Pt's states no medical or surgical changes since previsit or office visit.  Temps - [name] Vitals - Courtney Washington 

## 2019-04-07 NOTE — Op Note (Signed)
[name] Patient Name: [name] Procedure Date: 04/07/2019 8:06 AM MRN: [id number] Endoscopist: Docia Chuck. [name] , MD Age: 73 Referring MD:  Date of Birth: April 21, 1946 Gender: Female Account #: [id number] Procedure:                Upper GI endoscopy with biopsies Indications:              Iron deficiency anemia Medicines:                Monitored Anesthesia Care Procedure:                Pre-Anesthesia Assessment:                           - Prior to the procedure, a History and Physical                            was performed, and patient medications and                            allergies were reviewed. The patient's tolerance of                            previous anesthesia was also reviewed. The risks                            and benefits of the procedure and the sedation                            options and risks were discussed with the patient.                            All questions were answered, and informed consent                            was obtained. Prior Anticoagulants: The patient has                            taken no previous anticoagulant or antiplatelet                            agents. ASA Grade Assessment: III - A patient with                            severe systemic disease. After reviewing the risks                            and benefits, the patient was deemed in                            satisfactory condition to undergo the procedure.                           After obtaining informed consent, the endoscope was  passed under direct vision. Throughout the                            procedure, the patient's blood pressure, pulse, and                            oxygen saturations were monitored continuously. The                            Endoscope was introduced through the mouth, and                            advanced to the third part of duodenum. The upper                            GI endoscopy was  accomplished without difficulty.                            The patient tolerated the procedure well. Scope In: Scope Out: Findings:                 The esophagus was normal, possibly subtle stricture                            at the gastroesophageal junction.                           The stomach was normal except for a moderate sized                            sliding hiatal hernia without erosions and a 1 cm                            isolated inflammatory appearing polyp in the                            antrum/body region. Biopsies were taken with a cold                            forceps for histology.                           The examined duodenum was normal to the third                            portion.                           The cardia and gastric fundus were normal on                            retroflexion. Complications:            No immediate complications. Estimated Blood Loss:     Estimated blood loss: none. Impression:  1. Possible early esophageal stricture                           2. Moderate hiatal hernia without erosions                           3. Inflammatory appearing gastric polyp status post                            biopsies                           4. Otherwise normal exam. Recommendation:           1. Resume previous diet                           2. Continue iron therapy                           3. Continue budesonide therapy. Decreased by 3 mg                            (or 1 pill) every 3 to 4 weeks                           4. Follow-up with Dr. Henrene Brady in 6 to 8 weeks. Docia Chuck. Mackenzie Pastor, MD 04/07/2019 9:27:43 AM This report has been signed electronically.

## 2019-04-07 NOTE — Progress Notes (Signed)
PT taken to PACU. Monitors in place. VSS. Report given to RN. 

## 2019-04-07 NOTE — Progress Notes (Signed)
Called to room to assist during endoscopic procedure.  Patient ID and intended procedure confirmed with present staff. Received instructions for my participation in the procedure from the performing physician.  

## 2019-04-07 NOTE — Patient Instructions (Signed)
Handout on polyps, hemorrhoids given. Continue Budesonide therapy. Decreased by 3mg  ( 1pill) every 3 to 4 weeks.   YOU HAD AN ENDOSCOPIC PROCEDURE TODAY AT [facility]:   Refer to the procedure report that was given to you for any specific questions about what was found during the examination.  If the procedure report does not answer your questions, please call your gastroenterologist to clarify.  If you requested that your care partner not be given the details of your procedure findings, then the procedure report has been included in a sealed envelope for you to review at your convenience later.  YOU SHOULD EXPECT: Some feelings of bloating in the abdomen. Passage of more gas than usual.  Walking can help get rid of the air that was put into your GI tract during the procedure and reduce the bloating. If you had a lower endoscopy (such as a colonoscopy or flexible sigmoidoscopy) you may notice spotting of blood in your stool or on the toilet paper. If you underwent a bowel prep for your procedure, you may not have a normal bowel movement for a few days.  Please Note:  You might notice some irritation and congestion in your nose or some drainage.  This is from the oxygen used during your procedure.  There is no need for concern and it should clear up in a day or so.  SYMPTOMS TO REPORT IMMEDIATELY:   Following lower endoscopy (colonoscopy or flexible sigmoidoscopy):  Excessive amounts of blood in the stool  Significant tenderness or worsening of abdominal pains  Swelling of the abdomen that is new, acute  Fever of 100F or higher   Following upper endoscopy (EGD)  Vomiting of blood or coffee ground material  New chest pain or pain under the shoulder blades  Painful or persistently difficult swallowing  New shortness of breath  Fever of 100F or higher  Black, tarry-looking stools  For urgent or emergent issues, a gastroenterologist can be reached at any hour by calling  [phone].   DIET:  We do recommend a small meal at first, but then you may proceed to your regular diet.  Drink plenty of fluids but you should avoid alcoholic beverages for 24 hours.  ACTIVITY:  You should plan to take it easy for the rest of today and you should NOT DRIVE or use heavy machinery until tomorrow (because of the sedation medicines used during the test).    FOLLOW UP: Our staff will call the number listed on your records 48-72 hours following your procedure to check on you and address any questions or concerns that you may have regarding the information given to you following your procedure. If we do not reach you, we will leave a message.  We will attempt to reach you two times.  During this call, we will ask if you have developed any symptoms of COVID 19. If you develop any symptoms (ie: fever, flu-like symptoms, shortness of breath, cough etc.) before then, please call [phone].  If you test positive for Covid 19 in the 2 weeks post procedure, please call and report this information to [place].    If any biopsies were taken you will be contacted by phone or by letter within the next 1-3 weeks.  Please call us at [phone] if you have not heard about the biopsies in 3 weeks.    SIGNATURES/CONFIDENTIALITY: You and/or your care partner have signed paperwork which will be entered into your electronic medical record.  These  signatures attest to the fact that that the information above on your After Visit Summary has been reviewed and is understood.  Full responsibility of the confidentiality of this discharge information lies with you and/or your care-partner. 

## 2019-04-11 ENCOUNTER — Telehealth: Payer: Self-pay | Admitting: *Deleted

## 2019-04-11 NOTE — Telephone Encounter (Signed)
  Follow up Call-  Call back number 04/07/2019  Post procedure Call Back phone  # [phone]  Permission to leave phone message Yes  Some recent data might be hidden     Patient questions:  Do you have a fever, pain , or abdominal swelling? No. Pain Score  0 *  Have you tolerated food without any problems? Yes.    Have you been able to return to your normal activities? Yes.    Do you have any questions about your discharge instructions: Diet   No. Medications  No. Follow up visit  No.  Do you have questions or concerns about your Care? Yes.    Pt c/o lip being swollen after her procedure. She did have the bite block in and told her that might be a  possible cause. She did say that this was improving. Will let Dr. [name] know and Anesthesia.   Actions: * If pain score is 4 or above: No action needed, pain <4.  1. Have you developed a fever since your procedure? no  2.   Have you had an respiratory symptoms (SOB or cough) since your procedure? no  3.   Have you tested positive for COVID 19 since your procedure no  4.   Have you had any family members/close contacts diagnosed with the COVID 19 since your procedure?  no   If yes to any of these questions please route to Joylene John, RN and Alphonsa Gin, Therapist, sports.

## 2019-04-12 ENCOUNTER — Encounter: Payer: Self-pay | Admitting: Internal Medicine

## 2019-04-16 NOTE — H&P (Signed)
TOTAL KNEE ADMISSION H&P  Patient is being admitted for right total knee arthroplasty.  Subjective:  Chief Complaint:   Right knee primary OA / pain  HPI: [name], 73 y.o. female, has a history of pain and functional disability in the right knee due to arthritis and has failed non-surgical conservative treatments for greater than 12 weeks to include NSAID's and/or analgesics, corticosteriod injections, use of assistive devices and activity modification.  Onset of symptoms was gradual, starting  years ago with gradually worsening course since that time. The patient noted no past surgery on the right knee(s).  Patient currently rates pain in the right knee(s) at 8 out of 10 with activity. Patient has night pain, worsening of pain with activity and weight bearing, pain that interferes with activities of daily living, pain with passive range of motion, crepitus and joint swelling.  Patient has evidence of periarticular osteophytes and joint space narrowing by imaging studies.  There is no active infection.  Risks, benefits and expectations were discussed with the patient.  Risks including but not limited to the risk of anesthesia, blood clots, nerve damage, blood vessel damage, failure of the prosthesis, infection and up to and including death.  Patient understand the risks, benefits and expectations and wishes to proceed with surgery.   PCP: Robyne Peers, MD  D/C Plans:       Home   Post-op Meds:       No Rx given   Tranexamic Acid:      To be given - IV   Decadron:      Is to be given  FYI:      ASA  Dilaudid  CPAP  DME:   Rx for equipment sent  PT:   OPPT   [facility]    Patient Active Problem List   Diagnosis Date Noted  . Asthma 02/17/2019  . Flu-like symptoms 09/26/2018  . ILD (interstitial lung disease) ([place]) 09/26/2018  . Tinnitus, bilateral 10/20/2016  . Excessive cerumen in both ear canals 10/20/2016  . Benign essential tremor 06/09/2016   . Benign paroxysmal positional vertigo 06/09/2016  . History of benign brain tumor 06/09/2016  . Gait disturbance 06/09/2016  . Trimalleolar fracture of ankle, closed 04/03/2015  . Lumbar radiculopathy 04/19/2013  . DIARRHEA 09/15/2010  . PERSONAL HISTORY OF FAILED MODERATE SEDATION 09/15/2010  . DIARRHEA-PRESUMED INFECTIOUS 07/24/2010  . HEMORRHOIDS-INTERNAL 12/10/2008  . HEMORRHOIDS-EXTERNAL 12/10/2008  . PERSONAL HX COLONIC POLYPS 12/10/2008  . COLONIC POLYPS, HYPERPLASTIC 11/22/2008  . HYPERLIPIDEMIA 11/22/2008  . ANXIETY 11/22/2008  . DEPRESSION 11/22/2008  . ABNORMAL HEART RHYTHMS 11/22/2008  . HEMORRHOIDS 11/22/2008  . GERD 11/22/2008  . Diaphragmatic hernia 11/22/2008  . HYPERTENSION NEC 11/22/2008   Past Medical History:  Diagnosis Date  . Anxiety state, unspecified   . Benign neoplasm of colon   . Brain tumor ([place])   . Colon polyps    hyperplastic  . Complication of anesthesia    requests dilaudid for pain  . Complications affecting other specified body systems, hypertension    No cardiologist   . Contact lens/glasses fitting    wears glassses or contacts  . Depressive disorder, not elsewhere classified   . Dyspnea   . Esophageal reflux   . Esophageal ring   . Family history of colon cancer mother  . Fundic gland polyps of stomach, benign   . Hemorrhoids   . Hiatal hernia   . Hypertension   . Hypothyroidism   . Lymphocytic  colitis   . Obesity   . Other and unspecified hyperlipidemia   . Pulmonary fibrosis ([place])   . Seizures ([place])    x 1    Past Surgical History:  Procedure Laterality Date  . ABDOMINAL HYSTERECTOMY    . APPENDECTOMY    . BACK SURGERY    . BRAIN SURGERY  1999   benign tumor  . CARPAL TUNNEL RELEASE     right  . CATARACT EXTRACTION, BILATERAL  04/2018  . CRANIOTOMY    . EYE SURGERY    . HAMMERTOE RECONSTRUCTION WITH WEIL OSTEOTOMY  07/07/2012   Procedure: HAMMERTOE RECONSTRUCTION WITH WEIL OSTEOTOMY;  Surgeon: Wylene Simmer, MD;   Location: [facility];  Service: Orthopedics;  Laterality: Left;  Left Second through [place]; Left Fifth Toe Flexor Tendon Release, and Left Second through Fourth Weil Osteotomy  . ORIF ANKLE FRACTURE Left 04/04/2015   Procedure: OPEN REDUCTION INTERNAL FIXATION (ORIF) LEFT ANKLE FRACTURE;  Surgeon: Wylene Simmer, MD;  Location: [facility];  Service: Orthopedics;  Laterality: Left;    No current facility-administered medications for this encounter.    Current Outpatient Medications  Medication Sig Dispense Refill Last Dose  . Ascorbic Acid (VITAMIN C) 1000 MG tablet Take 1,000 mg by mouth daily.     Past Week  . aspirin 81 MG tablet Take 81 mg by mouth daily.    04/06/2019  . budesonide (ENTOCORT EC) 3 MG 24 hr capsule Take 3 capsules (9 mg total) by mouth daily. Take 3,  3mg  tabs in morning as needed 270 capsule 3 04/06/2019  . budesonide-formoterol (SYMBICORT) 160-4.5 MCG/ACT inhaler Inhale 2 puffs into the lungs 2 (two) times daily. 1 Inhaler 6 04/06/2019  . calcium-vitamin D (OSCAL WITH D) 500-200 MG-UNIT tablet Take 1 tablet by mouth daily.    Past Week  . cetirizine (ZYRTEC) 10 MG tablet Take 10 mg by mouth daily.   04/06/2019  . [facility] HYDROCORTISONE ACETATE 0.5 % cream APPLY TO AFFECTED AREA TOPICALLY AS NEEDED FOR HEMORRHOIDS (Patient taking differently: Apply 1 application topically daily as needed (for hemorrhoids). ) 60 g 0 04/06/2019  . diphenoxylate-atropine (LOMOTIL) 2.5-0.025 MG tablet Take 1 tab by mouth as needed. 180 tablet 3 Past Week  . famotidine (PEPCID) 20 MG tablet TAKE 1 TABLET BY MOUTH EVERYDAY AT BEDTIME (Patient taking differently: Take 20 mg by mouth at bedtime. ) 90 tablet 0 04/06/2019  . fluticasone (FLONASE) 50 MCG/ACT nasal spray Place 2 sprays into both nostrils daily.    04/06/2019  . hydrochlorothiazide (HYDRODIURIL) 25 MG tablet Take 25 mg by mouth daily.   04/06/2019  . Iron, Ferrous Sulfate, 325 (65 Fe) MG TABS Take 1 tablet by mouth 2 (two)  times daily. 180 tablet 3 Past Week  . levothyroxine (SYNTHROID) 125 MCG tablet Take 125 mcg by mouth daily.   Past Week  . Omega-3 Fatty Acids (FISH OIL PO) Take 1 capsule by mouth daily.    Past Week  . pantoprazole (PROTONIX) 40 MG tablet TAKE 1 TABLET BY MOUTH EVERY DAY (Patient taking differently: Take 40 mg by mouth daily. ) 90 tablet 0 04/06/2019  . pravastatin (PRAVACHOL) 40 MG tablet Take 40 mg by mouth daily.   04/06/2019  . Respiratory Therapy Supplies (FLUTTER) DEVI 1 Device by Does not apply route as directed. 1 each 0 04/06/2019  . sertraline (ZOLOFT) 100 MG tablet Take 150 mg by mouth daily.   04/06/2019  . Spacer/Aero-Holding Chambers DEVI 1 Device by Does not  apply route as directed. 1 each 0 Past Week  . Wheat Dextrin (BENEFIBER PO) Take 1 each by mouth daily.   Past Week   Allergies  Allergen Reactions  . Codeine Other (See Comments)    nightmares  . Hydrocod Polst-Cpm Polst Er Itching  . Hydrocodone-Acetaminophen Itching and Other (See Comments)    Has to take benadryl with to tolerate  . Sulfa Antibiotics Rash  . Sulfonamide Derivatives Rash    Social History   Tobacco Use  . Smoking status: Former Smoker    Packs/day: 0.50    Years: 20.00    Pack years: 10.00    Quit date: 09/02/2010    Years since quitting: 8.6  . Smokeless tobacco: Never Used  Substance Use Topics  . Alcohol use: Yes    Alcohol/week: 7.0 standard drinks    Types: 7 Standard drinks or equivalent per week    Comment: intermittently 1 martini daily on most days    Family History  Problem Relation Age of Onset  . Breast cancer Mother   . Colon cancer Mother   . Lung cancer Maternal Uncle   . Bone cancer Maternal Grandmother   . Heart attack Father   . Stomach cancer Neg Hx   . Pancreatic cancer Neg Hx   . Esophageal cancer Neg Hx      Review of Systems  Constitutional: Negative.   HENT: Positive for tinnitus.   Eyes: Negative.   Respiratory: Positive for shortness of breath (with  exertion).   Cardiovascular: Negative.   Gastrointestinal: Positive for heartburn.  Genitourinary: Negative.   Musculoskeletal: Positive for joint pain.  Skin: Negative.   Neurological: Negative.   Endo/Heme/Allergies: Negative.   Psychiatric/Behavioral: Positive for depression. The patient is nervous/anxious.     Objective:  Physical Exam  Constitutional: She is oriented to person, place, and time. She appears well-developed.  HENT:  Head: Normocephalic.  Eyes: Pupils are equal, round, and reactive to light.  Neck: Neck supple. No JVD present. No tracheal deviation present. No thyromegaly present.  Cardiovascular: Normal rate, regular rhythm and intact distal pulses.  Respiratory: Effort normal and breath sounds normal. No respiratory distress. She has no wheezes.  GI: Soft. There is no abdominal tenderness. There is no guarding.  Musculoskeletal:     Right knee: She exhibits swelling and bony tenderness. She exhibits no ecchymosis, no deformity, no laceration and no erythema. Tenderness found.  Lymphadenopathy:    She has no cervical adenopathy.  Neurological: She is alert and oriented to person, place, and time.  Skin: Skin is warm and dry.  Psychiatric: She has a normal mood and affect.     Labs:  Estimated body mass index is 33.64 kg/m as calculated from the following:   Height as of 04/07/19: 5\' 4"  (1.626 m).   Weight as of 04/07/19: 88.9 kg.   Imaging Review Plain radiographs demonstrate severe degenerative joint disease of the right knee(s).  The bone quality appears to be good for age and reported activity level.      Assessment/Plan:  End stage arthritis, right knee   The patient history, physical examination, clinical judgment of the provider and imaging studies are consistent with end stage degenerative joint disease of the right knee(s) and total knee arthroplasty is deemed medically necessary. The treatment options including medical management, injection  therapy arthroscopy and arthroplasty were discussed at length. The risks and benefits of total knee arthroplasty were presented and reviewed. The risks due to aseptic loosening, infection, stiffness,  patella tracking problems, thromboembolic complications and other imponderables were discussed. The patient acknowledged the explanation, agreed to proceed with the plan and consent was signed. Patient is being admitted for inpatient treatment for surgery, pain control, PT, OT, prophylactic antibiotics, VTE prophylaxis, progressive ambulation and ADL's and discharge planning. The patient is planning to be discharged home.     Patient's anticipated LOS is less than 2 midnights, meeting these requirements:  - Lives within 1 hour of care - Has a competent adult at home to recover with post-op recover - NO history of  - Chronic pain requiring opiods  - Diabetes  - Coronary Artery Disease  - Heart failure  - Heart attack  - Stroke  - DVT/VTE  - Cardiac arrhythmia  - Respiratory Failure/COPD  - Renal failure  - Anemia  - Advanced Liver disease     [place] P[place]. Adolphe Fortunato   PA-C  04/16/2019, 8:47 PM

## 2019-04-19 ENCOUNTER — Telehealth: Payer: Self-pay | Admitting: Internal Medicine

## 2019-04-19 DIAGNOSIS — J849 Interstitial pulmonary disease, unspecified: Secondary | ICD-10-CM

## 2019-04-19 NOTE — Telephone Encounter (Signed)
Spoke with pt. She has been scheduled for her PFT. MR does not have an available schedule for November yet. Will route message to [name] so that we know where the message is located.

## 2019-04-19 NOTE — Telephone Encounter (Signed)
[name]/[name]/TRiage  Please get patient to come and see me for 30 min ILD slot. I first saw 09/15/2018 and then last PFT in February 17, 2019. Pleasw have her come late October / early nov with spirometry and dlco  Thanks    SIGNATURE    Dr. Brand Males, M.D., F.C.C.P,  Pulmonary and Critical Care Medicine Staff Physician, [place] Director - Interstitial Lung Disease  Program  Pulmonary [place] [facility] [place], [place], [address]  Pager: [phone], If no answer or between  15:00h - 7:00h: call [phone] Telephone: [phone]  8:06 AM 04/19/2019

## 2019-04-24 NOTE — Telephone Encounter (Signed)
MR wants to see patient in clinic when he opens up his November schedule. Thanks

## 2019-04-24 NOTE — Telephone Encounter (Signed)
MR still has not opened up November clinic.  Will continue to hold.

## 2019-04-26 ENCOUNTER — Encounter (HOSPITAL_COMMUNITY): Payer: Self-pay

## 2019-04-26 NOTE — Patient Instructions (Addendum)
DUE TO COVID-19 ONLY ONE VISITOR IS ALLOWED TO COME WITH YOU AND STAY IN THE WAITING ROOM ONLY DURING PRE OP AND PROCEDURE DAY OF SURGERY. THE 1 VISITOR MAY VISIT WITH YOU AFTER SURGERY IN YOUR PRIVATE ROOM DURING VISITING HOURS ONLY!  YOU NEED TO HAVE A COVID 19 TEST ON_10/2______ @_2 :45 pm_____, THIS TEST MUST BE DONE BEFORE SURGERY, COME  [address], [place] , [address].  ([place])  ONCE YOUR COVID TEST IS COMPLETED, PLEASE BEGIN THE QUARANTINE INSTRUCTIONS AS OUTLINED IN YOUR HANDOUT.                [place]   Your procedure is scheduled on: 10?6/20   Report to [facility]  Entrance   Report to Short Stay at 5:30  AM     Call this number if you have problems the morning of surgery [place], NO [place].   Do not eat food After Midnight.   YOU MAY HAVE CLEAR LIQUIDS FROM MIDNIGHT UNTIL 4:30AM.   At 4:30AM Please finish the prescribed Pre-Surgery Gatorade drink.   Nothing by mouth after you finish the Gatorade drink !    Take these medicines the morning of surgery with A SIP OF WATER: Zoloft, Zyrtec, Budesonide, Synthroid, Protonix                                 You may not have any metal on your body including hair pins and              piercings              Do not wear jewelry, make-up, lotions, powders or perfumes, deodorant             Do not wear nail polish on your fingernails.  Do not shave  48 hours prior to surgery.         [facility] - Preparing for Surgery  Before surgery, you can play an important role.   Because skin is not sterile, your skin needs to be as free of germs as possible.   You can reduce the number of germs on your skin by washing with CHG (chlorahexidine gluconate) soap before surgery.   CHG is an antiseptic cleaner which kills germs and bonds with the skin to continue killing germs even after washing. Please  DO NOT use if you have an allergy to CHG or antibacterial soaps.   If your skin becomes reddened/irritated stop using the CHG and inform your nurse when you arrive at Short Stay. Do not shave (including legs and underarms) for at least 48 hours prior to the first CHG shower.   Please follow these instructions carefully:   1.  Shower with CHG Soap the night before surgery and the  morning of Surgery.  2.  If you choose to wash your hair, wash your hair first as usual with your  normal  shampoo.  3.  After you shampoo, rinse your hair and body thoroughly to remove the  shampoo.                                        4.  Use CHG as you would any other  liquid soap.  You can apply chg directly  to the skin and wash                       Gently with a scrungie or clean washcloth.  5.  Apply the CHG Soap to your body ONLY FROM THE NECK DOWN.   Do not use on face/ open                           Wound or open sores. Avoid contact with eyes, ears mouth and genitals (private parts).                       Wash face,  Genitals (private parts) with your normal soap.             6.  Wash thoroughly, paying special attention to the area where your surgery  will be performed.  7.  Thoroughly rinse your body with warm water from the neck down.  8.  DO NOT shower/wash with your normal soap after using and rinsing off  the CHG Soap.                9.  Pat yourself dry with a clean towel.            10.  Wear clean pajamas.            11.  Place clean sheets on your bed the night of your first shower and do not  sleep with pets. Day of Surgery : Do not apply any lotions/deodorants the morning of surgery.  Please wear clean clothes to the hospital/surgery center.  FAILURE TO FOLLOW THESE INSTRUCTIONS MAY RESULT IN THE CANCELLATION OF YOUR SURGERY PATIENT SIGNATURE_________________________________  NURSE  SIGNATURE__________________________________  ________________________________________________________________________   Do not bring valuables to the hospital. [facility] IS NOT             RESPONSIBLE   FOR VALUABLES.  Contacts, dentures or bridgework may not be worn into surgery.       Special Instructions: N/A              Please read over the following fact sheets you were given: _____________________________________________________________________    Incentive Spirometer  An incentive spirometer is a tool that can help keep your lungs clear and active. This tool measures how well you are filling your lungs with each breath. Taking long deep breaths may help reverse or decrease the chance of developing breathing (pulmonary) problems (especially infection) following:  A long period of time when you are unable to move or be active. BEFORE THE PROCEDURE   If the spirometer includes an indicator to show your best effort, your nurse or respiratory therapist will set it to a desired goal.  If possible, sit up straight or lean slightly forward. Try not to slouch.  Hold the incentive spirometer in an upright position. INSTRUCTIONS FOR USE  1. Sit on the edge of your bed if possible, or sit up as far as you can in bed or on a chair. 2. Hold the incentive spirometer in an upright position. 3. Breathe out normally. 4. Place the mouthpiece in your mouth and seal your lips tightly around it. 5. Breathe in slowly and as deeply as possible, raising the piston or the ball toward the top of the column. 6. Hold your breath for 3-5 seconds or for as long as possible. Allow the  piston or ball to fall to the bottom of the column. 7. Remove the mouthpiece from your mouth and breathe out normally. 8. Rest for a few seconds and repeat Steps 1 through 7 at least 10 times every 1-2 hours when you are awake. Take your time and take a few normal breaths between deep breaths. 9. The spirometer may  include an indicator to show your best effort. Use the indicator as a goal to work toward during each repetition. 10. After each set of 10 deep breaths, practice coughing to be sure your lungs are clear. If you have an incision (the cut made at the time of surgery), support your incision when coughing by placing a pillow or rolled up towels firmly against it. Once you are able to get out of bed, walk around indoors and cough well. You may stop using the incentive spirometer when instructed by your caregiver.  RISKS AND COMPLICATIONS  Take your time so you do not get dizzy or light-headed.  If you are in pain, you may need to take or ask for pain medication before doing incentive spirometry. It is harder to take a deep breath if you are having pain. AFTER USE  Rest and breathe slowly and easily.  It can be helpful to keep track of a log of your progress. Your caregiver can provide you with a simple table to help with this. If you are using the spirometer at home, follow these instructions: [place] IF:   You are having difficultly using the spirometer.  You have trouble using the spirometer as often as instructed.  Your pain medication is not giving enough relief while using the spirometer.  You develop fever of 100.5 F (38.1 C) or higher. SEEK IMMEDIATE MEDICAL CARE IF:   You cough up bloody sputum that had not been present before.  You develop fever of 102 F (38.9 C) or greater.  You develop worsening pain at or near the incision site. MAKE SURE YOU:   Understand these instructions.  Will watch your condition.  Will get help right away if you are not doing well or get worse. Document Released: 11/23/2006 Document Revised: 10/05/2011 Document Reviewed: 01/24/2007 ExitCare Patient Information 2014 ExitCare, [place].   ________________________________________________________________________  WHAT IS A BLOOD TRANSFUSION? Blood Transfusion Information  A  transfusion is the replacement of blood or some of its parts. Blood is made up of multiple cells which provide different functions.  Red blood cells carry oxygen and are used for blood loss replacement.  White blood cells fight against infection.  Platelets control bleeding.  Plasma helps clot blood.  Other blood products are available for specialized needs, such as hemophilia or other clotting disorders. BEFORE THE TRANSFUSION  Who gives blood for transfusions?   Healthy volunteers who are fully evaluated to make sure their blood is safe. This is blood bank blood. Transfusion therapy is the safest it has ever been in the practice of medicine. Before blood is taken from a donor, a complete history is taken to make sure that person has no history of diseases nor engages in risky social behavior (examples are intravenous drug use or sexual activity with multiple partners). The donor's travel history is screened to minimize risk of transmitting infections, such as malaria. The donated blood is tested for signs of infectious diseases, such as HIV and hepatitis. The blood is then tested to be sure it is compatible with you in order to minimize the chance of a transfusion reaction. If  you or a relative donates blood, this is often done in anticipation of surgery and is not appropriate for emergency situations. It takes many days to process the donated blood. RISKS AND COMPLICATIONS Although transfusion therapy is very safe and saves many lives, the main dangers of transfusion include:   Getting an infectious disease.  Developing a transfusion reaction. This is an allergic reaction to something in the blood you were given. Every precaution is taken to prevent this. The decision to have a blood transfusion has been considered carefully by your caregiver before blood is given. Blood is not given unless the benefits outweigh the risks. AFTER THE TRANSFUSION  Right after receiving a blood transfusion,  you will usually feel much better and more energetic. This is especially true if your red blood cells have gotten low (anemic). The transfusion raises the level of the red blood cells which carry oxygen, and this usually causes an energy increase.  The nurse administering the transfusion will monitor you carefully for complications. HOME CARE INSTRUCTIONS  No special instructions are needed after a transfusion. You may find your energy is better. Speak with your caregiver about any limitations on activity for underlying diseases you may have. SEEK MEDICAL CARE IF:   Your condition is not improving after your transfusion.  You develop redness or irritation at the intravenous (IV) site. SEEK IMMEDIATE MEDICAL CARE IF:  Any of the following symptoms occur over the next 12 hours:  Shaking chills.  You have a temperature by mouth above 102 F (38.9 C), not controlled by medicine.  Chest, back, or muscle pain.  People around you feel you are not acting correctly or are confused.  Shortness of breath or difficulty breathing.  Dizziness and fainting.  You get a rash or develop hives.  You have a decrease in urine output.  Your urine turns a dark color or changes to pink, red, or brown. Any of the following symptoms occur over the next 10 days:  You have a temperature by mouth above 102 F (38.9 C), not controlled by medicine.  Shortness of breath.  Weakness after normal activity.  The white part of the eye turns yellow (jaundice).  You have a decrease in the amount of urine or are urinating less often.  Your urine turns a dark color or changes to pink, red, or brown. Document Released: 07/10/2000 Document Revised: 10/05/2011 Document Reviewed: 02/27/2008 [facility] Patient Information 2014 [place], [place].  _______________________________________________________________________

## 2019-04-27 ENCOUNTER — Encounter (HOSPITAL_COMMUNITY): Payer: Self-pay

## 2019-04-27 ENCOUNTER — Encounter (HOSPITAL_COMMUNITY)
Admission: RE | Admit: 2019-04-27 | Discharge: 2019-04-27 | Disposition: A | Discharge status: Home / Self Care | Payer: Medicare Other | Source: Ambulatory Visit | Attending: Orthopedic Surgery | Admitting: Orthopedic Surgery

## 2019-04-27 ENCOUNTER — Other Ambulatory Visit: Payer: Self-pay

## 2019-04-27 DIAGNOSIS — Z01818 Encounter for other preprocedural examination: Secondary | ICD-10-CM | POA: Insufficient documentation

## 2019-04-27 HISTORY — DX: Malignant (primary) neoplasm, unspecified: C80.1

## 2019-04-27 HISTORY — DX: Personal history of urinary calculi: Z87.442

## 2019-04-27 LAB — CBC
HCT: 41.4 % (ref 36.0–46.0)
Hemoglobin: 12.3 g/dL (ref 12.0–15.0)
MCH: 27 pg (ref 26.0–34.0)
MCHC: 29.7 g/dL — ABNORMAL LOW (ref 30.0–36.0)
MCV: 90.8 fL (ref 80.0–100.0)
Platelets: 236 10*3/uL (ref 150–400)
RBC: 4.56 MIL/uL (ref 3.87–5.11)
RDW: 23.3 % — ABNORMAL HIGH (ref 11.5–15.5)
WBC: 8.5 10*3/uL (ref 4.0–10.5)
nRBC: 0 % (ref 0.0–0.2)

## 2019-04-27 LAB — BASIC METABOLIC PANEL
Anion gap: 8 (ref 5–15)
BUN: 20 mg/dL (ref 8–23)
CO2: 26 mmol/L (ref 22–32)
Calcium: 10.4 mg/dL — ABNORMAL HIGH (ref 8.9–10.3)
Chloride: 100 mmol/L (ref 98–111)
Creatinine, Ser: 0.79 mg/dL (ref 0.44–1.00)
GFR calc Af Amer: >60 mL/min (ref >60)
GFR calc non Af Amer: >60 mL/min (ref >60)
Glucose, Bld: 105 mg/dL — ABNORMAL HIGH (ref 70–99)
Potassium: 4.1 mmol/L (ref 3.5–5.1)
Sodium: 134 mmol/L — ABNORMAL LOW (ref 135–145)

## 2019-04-27 LAB — SURGICAL PCR SCREEN
MRSA, PCR: NEGATIVE
Staphylococcus aureus: NEGATIVE

## 2019-04-27 NOTE — Progress Notes (Signed)
PCP - Dr. Clarene Reamer Cardiologist -  Pulmonologist-  Chest x-ray - 04/05/19,02/28/19 EKG - 03/01/19 Stress Test -  ECHO - no Cardiac Cath - no Pulmonary function test-02/17/19  Sleep Study -  CPAP -   Fasting Blood Sugar - NA Checks Blood Sugar _____ times a day  Blood Thinner Instructions:ASA Aspirin Instructions: Stop 5 days prior to DOS Last Dose:04/26/19  Anesthesia review:   Patient denies shortness of breath, fever, cough and chest pain at PAT appointment  yes Patient verbalized understanding of instructions that were given to them at the PAT appointment. Patient was also instructed that they will need to review over the PAT instructions again at home before surgery. yes

## 2019-04-28 ENCOUNTER — Other Ambulatory Visit (HOSPITAL_COMMUNITY)
Admission: RE | Admit: 2019-04-28 | Discharge: 2019-04-28 | Disposition: A | Discharge status: Home / Self Care | Payer: Medicare Other | Source: Ambulatory Visit | Attending: Orthopedic Surgery | Admitting: Orthopedic Surgery

## 2019-04-28 DIAGNOSIS — Z20828 Contact with and (suspected) exposure to other viral communicable diseases: Secondary | ICD-10-CM | POA: Diagnosis not present

## 2019-04-28 DIAGNOSIS — Z01812 Encounter for preprocedural laboratory examination: Secondary | ICD-10-CM | POA: Insufficient documentation

## 2019-04-28 NOTE — Anesthesia Preprocedure Evaluation (Addendum)
Anesthesia Evaluation  Patient identified by MRN, date of birth, ID band Patient awake  General Assessment Comment:  Complication of anesthesia  requests dilaudid for pain   Reviewed: Allergy & Precautions, H&P , NPO status , Patient's Chart, lab work & pertinent test results, reviewed documented beta blocker date and time   History of Anesthesia Complications (+) history of anesthetic complications  Airway Mallampati: II  TM Distance: >3 FB Neck ROM: full    Dental no notable dental hx.    Pulmonary shortness of breath and with exertion, former smoker,    Pulmonary exam normal breath sounds clear to auscultation       Cardiovascular hypertension, Pt. on medications Normal cardiovascular exam Rhythm:regular Rate:Normal     Neuro/Psych Seizures -, Well Controlled,  negative psych ROS   GI/Hepatic Neg liver ROS, hiatal hernia, GERD  ,  Endo/Other  Hypothyroidism   Renal/GU negative Renal ROS  negative genitourinary   Musculoskeletal   Abdominal (+) + obese,   Peds  Hematology negative hematology ROS (+)   Anesthesia Other Findings   Reproductive/Obstetrics negative OB ROS                           Anesthesia Physical Anesthesia Plan  ASA: III  Anesthesia Plan: Spinal and MAC   Post-op Pain Management:    Induction:   PONV Risk Score and Plan: 2 and Ondansetron, Dexamethasone, Treatment may vary due to age or medical condition and Midazolam  Airway Management Planned: Nasal Cannula, Simple Face Mask and Mask  Additional Equipment:   Intra-op Plan:   Post-operative Plan:   Informed Consent: I have reviewed the patients History and Physical, chart, labs and discussed the procedure including the risks, benefits and alternatives for the proposed anesthesia with the patient or authorized representative who has indicated his/her understanding and acceptance.       Plan Discussed  with: CRNA, Anesthesiologist and Surgeon  Anesthesia Plan Comments: (See PAT note 04/27/2019, Konrad Felix, PA-C)       Anesthesia Quick Evaluation

## 2019-04-28 NOTE — Progress Notes (Signed)
Anesthesia Chart Review   Case: [id number] Date/Time: 05/02/19 0700   Procedure: TOTAL KNEE ARTHROPLASTY (Right Knee) - 70 mins   Anesthesia type: Spinal   Pre-op diagnosis: Right knee osteoarthritis   Location: [facility] ROOM 09 / [facility]   Surgeon: Paralee Cancel, MD      DISCUSSION:73 y.o. former smoker (10 pack years, quit 09/02/10) with h/o hypothyroidism, HTN, hiatal hernia, ILD, right knee OA scheduled for above procedure 05/02/2019 with Dr. Paralee Cancel.   Cleared by pulmonology.  Clearance states pt is low risk for sugical procedure (in media).  Per Geraldo Pitter on 03/28/2019, "PFTs normal, pt has ILD/asthma.  Cont Symbicort twice daily.  Important to ambulate/get out of bed early; use incentive spirometer and continue bronchodilaters."  Anticipate pt can proceed with planned procedure barring acute status change and after evaluation.  Stable at PAT visit.   VS: BP (!) 159/74 (BP Location: Left Arm)   Pulse 72   Temp 37.2 C (Oral)   Resp 18   Ht 5\' 4"  (1.626 m)   Wt 90.4 kg   SpO2 100%   BMI 34.21 kg/m   PROVIDERS: Robyne Peers, MD is PCP   Brand Males, MD is Pulmonologist  LABS: Labs reviewed: Acceptable for surgery. (all labs ordered are listed, but only abnormal results are displayed)  Labs Reviewed  BASIC METABOLIC PANEL - Abnormal; Notable for the following components:      Result Value   Sodium 134 (*)    Glucose, Bld 105 (*)    Calcium 10.4 (*)    All other components within normal limits  CBC - Abnormal; Notable for the following components:   MCHC 29.7 (*)    RDW 23.3 (*)    All other components within normal limits  SURGICAL PCR SCREEN  TYPE AND SCREEN     IMAGES: CT Chest 04/05/2019 IMPRESSION: 1. Pulmonary parenchymal pattern of fibrosis, together with air trapping, may be due to chronic hypersensitivity pneumonitis. Nonspecific interstitial pneumonitis is another consideration. Findings are suggestive of an alternative diagnosis (not UIP)  per consensus guidelines: Diagnosis of Idiopathic Pulmonary Fibrosis: An Official ATS/ERS/JRS/ALAT Clinical Practice Guideline. [place], Iss 5, [phone], Mar 27 2017. 2. 3 mm left lower lobe nodule, stable. 3. Left renal stone. 4. Aortic atherosclerosis (ICD10-170.0). Coronary artery calcification.  EKG: 03/01/2019 Rate bpm Sinus rhythm  Borderline T wave abnormalities   CV:  Past Medical History:  Diagnosis Date  . Anxiety state, unspecified   . Benign neoplasm of colon   . Brain tumor ([place])   . Cancer ([place])   . Colon polyps    hyperplastic  . Complication of anesthesia    requests dilaudid for pain  . Complications affecting other specified body systems, hypertension    No cardiologist   . Contact lens/glasses fitting    wears glassses or contacts  . Depressive disorder, not elsewhere classified   . Dyspnea    due to fibrosis  . Esophageal reflux   . Esophageal ring   . Family history of colon cancer mother  . Fundic gland polyps of stomach, benign   . Hemorrhoids   . Hiatal hernia   . History of kidney stones   . Hypertension   . Hypothyroidism   . Lymphocytic colitis   . Obesity   . Other and unspecified hyperlipidemia   . Pneumonia 02/28/2019  . Pulmonary fibrosis ([place])   . Seizures ([place])    x 1  Past Surgical History:  Procedure Laterality Date  . ABDOMINAL HYSTERECTOMY    . APPENDECTOMY    . BACK SURGERY  2014   rod in lower back  . BRAIN SURGERY  1999   benign tumor  . CARPAL TUNNEL RELEASE     right  . CATARACT EXTRACTION, BILATERAL  04/2018  . CRANIOTOMY    . EYE SURGERY    . HAMMERTOE RECONSTRUCTION WITH WEIL OSTEOTOMY  07/07/2012   Procedure: HAMMERTOE RECONSTRUCTION WITH WEIL OSTEOTOMY;  Surgeon: Wylene Simmer, MD;  Location: [facility];  Service: Orthopedics;  Laterality: Left;  Left Second through [place]; Left Fifth Toe Flexor Tendon Release, and Left Second through [place]  Osteotomy  . ORIF ANKLE FRACTURE Left 04/04/2015   Procedure: OPEN REDUCTION INTERNAL FIXATION (ORIF) LEFT ANKLE FRACTURE;  Surgeon: Wylene Simmer, MD;  Location: [facility];  Service: Orthopedics;  Laterality: Left;    MEDICATIONS: . Ascorbic Acid (VITAMIN C) 1000 MG tablet  . aspirin 81 MG tablet  . budesonide (ENTOCORT EC) 3 MG 24 hr capsule  . budesonide-formoterol (SYMBICORT) 160-4.5 MCG/ACT inhaler  . Calcium Carbonate-Vitamin D 600-400 MG-UNIT tablet  . cephALEXin (KEFLEX) 500 MG capsule  . cetirizine (ZYRTEC) 10 MG tablet  . CVS HYDROCORTISONE ACETATE 0.5 % cream  . diphenoxylate-atropine (LOMOTIL) 2.5-0.025 MG tablet  . famotidine (PEPCID) 20 MG tablet  . hydrochlorothiazide (HYDRODIURIL) 25 MG tablet  . Iron, Ferrous Sulfate, 325 (65 Fe) MG TABS  . levothyroxine (SYNTHROID) 125 MCG tablet  . Melatonin 5 MG TABS  . Omega-3 Fatty Acids (FISH OIL PO)  . pantoprazole (PROTONIX) 40 MG tablet  . Polyethyl Glycol-Propyl Glycol (LUBRICANT EYE DROPS) 0.4-0.3 % SOLN  . pravastatin (PRAVACHOL) 20 MG tablet  . Respiratory Therapy Supplies (FLUTTER) DEVI  . sertraline (ZOLOFT) 100 MG tablet  . Spacer/Aero-Holding Chambers DEVI   No current facility-administered medications for this encounter.     Maia Plan [facility] Pre-Surgical Testing [phone] 04/28/19  12:02 PM

## 2019-04-30 LAB — NOVEL CORONAVIRUS, NAA (HOSP ORDER, SEND-OUT TO REF LAB; TAT 18-24 HRS): SARS-CoV-2, NAA: NOT DETECTED

## 2019-05-02 ENCOUNTER — Other Ambulatory Visit: Payer: Self-pay

## 2019-05-02 ENCOUNTER — Encounter (HOSPITAL_COMMUNITY)
Admission: RE | Disposition: A | Discharge status: Home / Self Care | Payer: Self-pay | Source: Home / Self Care | Attending: Orthopedic Surgery

## 2019-05-02 ENCOUNTER — Observation Stay (HOSPITAL_COMMUNITY)
Admission: RE | Admit: 2019-05-02 | Discharge: 2019-05-03 | Disposition: A | Discharge status: Home / Self Care | Payer: Medicare Other | Attending: Orthopedic Surgery | Admitting: Orthopedic Surgery

## 2019-05-02 ENCOUNTER — Ambulatory Visit (HOSPITAL_COMMUNITY): Payer: Medicare Other | Admitting: Physician Assistant

## 2019-05-02 ENCOUNTER — Encounter (HOSPITAL_COMMUNITY): Payer: Self-pay | Admitting: Emergency Medicine

## 2019-05-02 ENCOUNTER — Ambulatory Visit (HOSPITAL_COMMUNITY): Payer: Medicare Other | Admitting: Certified Registered"

## 2019-05-02 DIAGNOSIS — M1711 Unilateral primary osteoarthritis, right knee: Principal | ICD-10-CM | POA: Insufficient documentation

## 2019-05-02 DIAGNOSIS — F329 Major depressive disorder, single episode, unspecified: Secondary | ICD-10-CM | POA: Insufficient documentation

## 2019-05-02 DIAGNOSIS — Z7982 Long term (current) use of aspirin: Secondary | ICD-10-CM | POA: Diagnosis not present

## 2019-05-02 DIAGNOSIS — K219 Gastro-esophageal reflux disease without esophagitis: Secondary | ICD-10-CM | POA: Diagnosis not present

## 2019-05-02 DIAGNOSIS — E669 Obesity, unspecified: Secondary | ICD-10-CM | POA: Diagnosis not present

## 2019-05-02 DIAGNOSIS — Z7951 Long term (current) use of inhaled steroids: Secondary | ICD-10-CM | POA: Diagnosis not present

## 2019-05-02 DIAGNOSIS — Z87891 Personal history of nicotine dependence: Secondary | ICD-10-CM | POA: Diagnosis not present

## 2019-05-02 DIAGNOSIS — I10 Essential (primary) hypertension: Secondary | ICD-10-CM | POA: Diagnosis not present

## 2019-05-02 DIAGNOSIS — Z79899 Other long term (current) drug therapy: Secondary | ICD-10-CM | POA: Diagnosis not present

## 2019-05-02 DIAGNOSIS — Z79890 Hormone replacement therapy (postmenopausal): Secondary | ICD-10-CM | POA: Insufficient documentation

## 2019-05-02 DIAGNOSIS — E039 Hypothyroidism, unspecified: Secondary | ICD-10-CM | POA: Insufficient documentation

## 2019-05-02 DIAGNOSIS — F419 Anxiety disorder, unspecified: Secondary | ICD-10-CM | POA: Diagnosis not present

## 2019-05-02 DIAGNOSIS — J8410 Pulmonary fibrosis, unspecified: Secondary | ICD-10-CM | POA: Insufficient documentation

## 2019-05-02 DIAGNOSIS — Z6834 Body mass index (BMI) 34.0-34.9, adult: Secondary | ICD-10-CM | POA: Diagnosis not present

## 2019-05-02 DIAGNOSIS — E785 Hyperlipidemia, unspecified: Secondary | ICD-10-CM | POA: Insufficient documentation

## 2019-05-02 DIAGNOSIS — Z96651 Presence of right artificial knee joint: Secondary | ICD-10-CM

## 2019-05-02 HISTORY — PX: TOTAL KNEE ARTHROPLASTY: SHX125

## 2019-05-02 LAB — TYPE AND SCREEN
ABO/RH(D): AB NEG
Antibody Screen: NEGATIVE

## 2019-05-02 SURGERY — ARTHROPLASTY, KNEE, TOTAL
Anesthesia: Monitor Anesthesia Care | Site: Knee | Laterality: Right

## 2019-05-02 MED ORDER — PHENOL 1.4 % MT LIQD: 1.0000 | OROMUCOSAL | Status: DC | PRN

## 2019-05-02 MED ORDER — HYDROCORTISONE ACETATE 0.5 % EX CREA: 1.0000 "application " | TOPICAL_CREAM | Freq: Every day | CUTANEOUS | Status: DC | PRN

## 2019-05-02 MED ORDER — ONDANSETRON HCL 4 MG/2ML IJ SOLN
INTRAMUSCULAR | Status: AC
  Filled 2019-05-02: qty 2

## 2019-05-02 MED ORDER — MIDAZOLAM HCL 2 MG/2ML IJ SOLN
INTRAMUSCULAR | Status: AC
  Filled 2019-05-02: qty 2

## 2019-05-02 MED ORDER — LACTATED RINGERS IV SOLN
INTRAVENOUS | Status: DC
  Administered 2019-05-02 (×2): via INTRAVENOUS

## 2019-05-02 MED ORDER — DEXAMETHASONE SODIUM PHOSPHATE 10 MG/ML IJ SOLN
INTRAMUSCULAR | Status: AC
  Filled 2019-05-02: qty 1

## 2019-05-02 MED ORDER — ALUM & MAG HYDROXIDE-SIMETH 200-200-20 MG/5ML PO SUSP: 15.0000 mL | ORAL | Status: DC | PRN

## 2019-05-02 MED ORDER — PHENYLEPHRINE HCL (PRESSORS) 10 MG/ML IV SOLN
INTRAVENOUS | Status: AC
  Filled 2019-05-02: qty 1

## 2019-05-02 MED ORDER — BISACODYL 10 MG RE SUPP: 10.0000 mg | Freq: Every day | RECTAL | Status: DC | PRN

## 2019-05-02 MED ORDER — MAGNESIUM CITRATE PO SOLN: 1.0000 | Freq: Once | ORAL | Status: DC | PRN

## 2019-05-02 MED ORDER — KETOROLAC TROMETHAMINE 30 MG/ML IJ SOLN
INTRAMUSCULAR | Status: DC | PRN
  Administered 2019-05-02: 30 mg

## 2019-05-02 MED ORDER — SERTRALINE HCL 50 MG PO TABS
150.0000 mg | ORAL_TABLET | Freq: Every day | ORAL | Status: DC
  Administered 2019-05-03: 150 mg via ORAL
  Filled 2019-05-02: qty 1

## 2019-05-02 MED ORDER — ROPIVACAINE HCL 7.5 MG/ML IJ SOLN
INTRAMUSCULAR | Status: DC | PRN
  Administered 2019-05-02: 30 mL via PERINEURAL

## 2019-05-02 MED ORDER — BUPIVACAINE HCL (PF) 0.25 % IJ SOLN
INTRAMUSCULAR | Status: AC
  Filled 2019-05-02: qty 30

## 2019-05-02 MED ORDER — POLYETHYLENE GLYCOL 3350 17 G PO PACK
17.0000 g | PACK | Freq: Two times a day (BID) | ORAL | Status: DC
  Administered 2019-05-02 – 2019-05-03 (×3): 17 g via ORAL
  Filled 2019-05-02 (×3): qty 1

## 2019-05-02 MED ORDER — EPHEDRINE 5 MG/ML INJ
INTRAVENOUS | Status: AC
  Filled 2019-05-02: qty 10

## 2019-05-02 MED ORDER — LIDOCAINE 2% (20 MG/ML) 5 ML SYRINGE
INTRAMUSCULAR | Status: DC | PRN
  Administered 2019-05-02: 50 mg via INTRAVENOUS

## 2019-05-02 MED ORDER — LORATADINE 10 MG PO TABS
10.0000 mg | ORAL_TABLET | Freq: Every day | ORAL | Status: DC
  Administered 2019-05-03: 10 mg via ORAL
  Filled 2019-05-02: qty 1

## 2019-05-02 MED ORDER — LIDOCAINE 2% (20 MG/ML) 5 ML SYRINGE
INTRAMUSCULAR | Status: AC
  Filled 2019-05-02: qty 5

## 2019-05-02 MED ORDER — FENTANYL CITRATE (PF) 100 MCG/2ML IJ SOLN: 25.0000 ug | INTRAMUSCULAR | Status: DC | PRN

## 2019-05-02 MED ORDER — ONDANSETRON HCL 4 MG PO TABS: 4.0000 mg | ORAL_TABLET | Freq: Four times a day (QID) | ORAL | Status: DC | PRN

## 2019-05-02 MED ORDER — ONDANSETRON HCL 4 MG/2ML IJ SOLN
INTRAMUSCULAR | Status: DC | PRN
  Administered 2019-05-02: 4 mg via INTRAVENOUS

## 2019-05-02 MED ORDER — SODIUM CHLORIDE 0.9 % IV SOLN
INTRAVENOUS | Status: DC | PRN
  Administered 2019-05-02: 5 ug/min via INTRAVENOUS

## 2019-05-02 MED ORDER — MIDAZOLAM HCL 5 MG/5ML IJ SOLN
INTRAMUSCULAR | Status: DC | PRN
  Administered 2019-05-02 (×2): 1 mg via INTRAVENOUS

## 2019-05-02 MED ORDER — TRANEXAMIC ACID-NACL 1000-0.7 MG/100ML-% IV SOLN
1000.0000 mg | Freq: Once | INTRAVENOUS | Status: AC
  Administered 2019-05-02: 12:00:00 1000 mg via INTRAVENOUS
  Filled 2019-05-02: qty 100

## 2019-05-02 MED ORDER — METHOCARBAMOL 500 MG PO TABS
500.0000 mg | ORAL_TABLET | Freq: Four times a day (QID) | ORAL | Status: DC | PRN
  Administered 2019-05-02 – 2019-05-03 (×2): 500 mg via ORAL
  Filled 2019-05-02 (×3): qty 1

## 2019-05-02 MED ORDER — OXYCODONE HCL 5 MG PO TABS
5.0000 mg | ORAL_TABLET | ORAL | Status: DC | PRN
  Administered 2019-05-02 (×3): 10 mg via ORAL
  Filled 2019-05-02 (×3): qty 2

## 2019-05-02 MED ORDER — SODIUM CHLORIDE 0.9 % IV SOLN
INTRAVENOUS | Status: DC
  Administered 2019-05-02: 20:00:00 via INTRAVENOUS

## 2019-05-02 MED ORDER — OXYCODONE HCL 5 MG PO TABS
10.0000 mg | ORAL_TABLET | ORAL | Status: DC | PRN
  Administered 2019-05-03: 15 mg via ORAL
  Filled 2019-05-02: qty 3

## 2019-05-02 MED ORDER — CLONIDINE HCL (ANALGESIA) 100 MCG/ML EP SOLN
EPIDURAL | Status: DC | PRN
  Administered 2019-05-02: 100 ug

## 2019-05-02 MED ORDER — PROPOFOL 10 MG/ML IV BOLUS
INTRAVENOUS | Status: AC
  Filled 2019-05-02: qty 20

## 2019-05-02 MED ORDER — HYDROMORPHONE HCL 1 MG/ML IJ SOLN
0.5000 mg | INTRAMUSCULAR | Status: DC | PRN
  Administered 2019-05-02 – 2019-05-03 (×2): 1 mg via INTRAVENOUS
  Filled 2019-05-02 (×2): qty 1

## 2019-05-02 MED ORDER — PROPOFOL 500 MG/50ML IV EMUL
INTRAVENOUS | Status: AC
  Filled 2019-05-02: qty 100

## 2019-05-02 MED ORDER — FENTANYL CITRATE (PF) 100 MCG/2ML IJ SOLN
INTRAMUSCULAR | Status: AC
  Filled 2019-05-02: qty 2

## 2019-05-02 MED ORDER — CHLORHEXIDINE GLUCONATE 4 % EX LIQD: 60.0000 mL | Freq: Once | CUTANEOUS | Status: DC

## 2019-05-02 MED ORDER — HYDROCHLOROTHIAZIDE 25 MG PO TABS
25.0000 mg | ORAL_TABLET | Freq: Every day | ORAL | Status: DC
  Administered 2019-05-03: 10:00:00 25 mg via ORAL
  Filled 2019-05-02: qty 1

## 2019-05-02 MED ORDER — POLYETHYL GLYCOL-PROPYL GLYCOL 0.4-0.3 % OP SOLN: 1.0000 [drp] | Freq: Three times a day (TID) | OPHTHALMIC | Status: DC | PRN

## 2019-05-02 MED ORDER — ACETAMINOPHEN 325 MG PO TABS
325.0000 mg | ORAL_TABLET | Freq: Four times a day (QID) | ORAL | Status: DC | PRN
  Administered 2019-05-03: 11:00:00 325 mg via ORAL
  Filled 2019-05-02: qty 1

## 2019-05-02 MED ORDER — OXYCODONE HCL 5 MG/5ML PO SOLN: 5.0000 mg | Freq: Once | ORAL | Status: DC | PRN

## 2019-05-02 MED ORDER — FAMOTIDINE 20 MG PO TABS
20.0000 mg | ORAL_TABLET | Freq: Every day | ORAL | Status: DC
  Administered 2019-05-02: 20 mg via ORAL
  Filled 2019-05-02: qty 1

## 2019-05-02 MED ORDER — HYDROMORPHONE HCL 1 MG/ML IJ SOLN: 0.5000 mg | INTRAMUSCULAR | Status: DC | PRN

## 2019-05-02 MED ORDER — PANTOPRAZOLE SODIUM 40 MG PO TBEC
40.0000 mg | DELAYED_RELEASE_TABLET | Freq: Every day | ORAL | Status: DC
  Administered 2019-05-03: 10:00:00 40 mg via ORAL
  Filled 2019-05-02: qty 1

## 2019-05-02 MED ORDER — SODIUM CHLORIDE (PF) 0.9 % IJ SOLN
INTRAMUSCULAR | Status: AC
  Filled 2019-05-02: qty 50

## 2019-05-02 MED ORDER — DOCUSATE SODIUM 100 MG PO CAPS
100.0000 mg | ORAL_CAPSULE | Freq: Two times a day (BID) | ORAL | Status: DC
  Administered 2019-05-02 – 2019-05-03 (×3): 100 mg via ORAL
  Filled 2019-05-02 (×3): qty 1

## 2019-05-02 MED ORDER — HYDROCORTISONE (PERIANAL) 2.5 % EX CREA: TOPICAL_CREAM | Freq: Every day | CUTANEOUS | Status: DC | PRN

## 2019-05-02 MED ORDER — SODIUM CHLORIDE (PF) 0.9 % IJ SOLN
INTRAMUSCULAR | Status: DC | PRN
  Administered 2019-05-02: 30 mL

## 2019-05-02 MED ORDER — ASPIRIN 81 MG PO CHEW
81.0000 mg | CHEWABLE_TABLET | Freq: Two times a day (BID) | ORAL | Status: DC
  Administered 2019-05-02 – 2019-05-03 (×2): 81 mg via ORAL
  Filled 2019-05-02 (×2): qty 1

## 2019-05-02 MED ORDER — METOCLOPRAMIDE HCL 5 MG/ML IJ SOLN: 5.0000 mg | Freq: Three times a day (TID) | INTRAMUSCULAR | Status: DC | PRN

## 2019-05-02 MED ORDER — TRANEXAMIC ACID-NACL 1000-0.7 MG/100ML-% IV SOLN
1000.0000 mg | INTRAVENOUS | Status: AC
  Administered 2019-05-02: 1000 mg via INTRAVENOUS
  Filled 2019-05-02: qty 100

## 2019-05-02 MED ORDER — BUDESONIDE 3 MG PO CPEP
6.0000 mg | ORAL_CAPSULE | Freq: Every day | ORAL | Status: DC
  Administered 2019-05-03: 11:00:00 6 mg via ORAL
  Filled 2019-05-02: qty 2

## 2019-05-02 MED ORDER — FERROUS SULFATE 325 (65 FE) MG PO TABS
325.0000 mg | ORAL_TABLET | Freq: Two times a day (BID) | ORAL | Status: DC
  Administered 2019-05-02 – 2019-05-03 (×2): 325 mg via ORAL
  Filled 2019-05-02 (×2): qty 1

## 2019-05-02 MED ORDER — KETOROLAC TROMETHAMINE 30 MG/ML IJ SOLN
INTRAMUSCULAR | Status: AC
  Filled 2019-05-02: qty 1

## 2019-05-02 MED ORDER — DEXAMETHASONE SODIUM PHOSPHATE 10 MG/ML IJ SOLN
10.0000 mg | Freq: Once | INTRAMUSCULAR | Status: AC
  Administered 2019-05-02: 10 mg via INTRAVENOUS

## 2019-05-02 MED ORDER — MELATONIN 5 MG PO TABS
7.5000 mg | ORAL_TABLET | Freq: Every day | ORAL | Status: DC
  Administered 2019-05-02: 7.5 mg via ORAL
  Filled 2019-05-02 (×2): qty 1.5

## 2019-05-02 MED ORDER — BUPIVACAINE HCL 0.25 % IJ SOLN
INTRAMUSCULAR | Status: DC | PRN
  Administered 2019-05-02: 30 mL

## 2019-05-02 MED ORDER — CEFAZOLIN SODIUM-DEXTROSE 2-4 GM/100ML-% IV SOLN
2.0000 g | INTRAVENOUS | Status: AC
  Administered 2019-05-02: 2 g via INTRAVENOUS
  Filled 2019-05-02: qty 100

## 2019-05-02 MED ORDER — ACETAMINOPHEN 325 MG PO TABS: 325.0000 mg | ORAL_TABLET | ORAL | Status: DC | PRN

## 2019-05-02 MED ORDER — ONDANSETRON HCL 4 MG/2ML IJ SOLN: 4.0000 mg | Freq: Once | INTRAMUSCULAR | Status: DC | PRN

## 2019-05-02 MED ORDER — BUPIVACAINE IN DEXTROSE 0.75-8.25 % IT SOLN
INTRATHECAL | Status: DC | PRN
  Administered 2019-05-02: 1.6 mL via INTRATHECAL

## 2019-05-02 MED ORDER — POLYVINYL ALCOHOL 1.4 % OP SOLN: 1.0000 [drp] | Freq: Three times a day (TID) | OPHTHALMIC | Status: DC | PRN

## 2019-05-02 MED ORDER — ONDANSETRON HCL 4 MG/2ML IJ SOLN: 4.0000 mg | Freq: Four times a day (QID) | INTRAMUSCULAR | Status: DC | PRN

## 2019-05-02 MED ORDER — MEPERIDINE HCL 50 MG/ML IJ SOLN: 6.2500 mg | INTRAMUSCULAR | Status: DC | PRN

## 2019-05-02 MED ORDER — METHOCARBAMOL 500 MG IVPB - SIMPLE MED
500.0000 mg | Freq: Four times a day (QID) | INTRAVENOUS | Status: DC | PRN
  Filled 2019-05-02: qty 50

## 2019-05-02 MED ORDER — PHENYLEPHRINE 40 MCG/ML (10ML) SYRINGE FOR IV PUSH (FOR BLOOD PRESSURE SUPPORT)
PREFILLED_SYRINGE | INTRAVENOUS | Status: AC
  Filled 2019-05-02: qty 10

## 2019-05-02 MED ORDER — LEVOTHYROXINE SODIUM 125 MCG PO TABS
125.0000 ug | ORAL_TABLET | Freq: Every day | ORAL | Status: DC
  Administered 2019-05-03: 125 ug via ORAL
  Filled 2019-05-02: qty 1

## 2019-05-02 MED ORDER — DIPHENOXYLATE-ATROPINE 2.5-0.025 MG PO TABS: 1.0000 | ORAL_TABLET | Freq: Four times a day (QID) | ORAL | Status: DC | PRN

## 2019-05-02 MED ORDER — DIPHENHYDRAMINE HCL 12.5 MG/5ML PO ELIX: 12.5000 mg | ORAL_SOLUTION | ORAL | Status: DC | PRN

## 2019-05-02 MED ORDER — FENTANYL CITRATE (PF) 100 MCG/2ML IJ SOLN
INTRAMUSCULAR | Status: DC | PRN
  Administered 2019-05-02 (×2): 50 ug via INTRAVENOUS

## 2019-05-02 MED ORDER — PROPOFOL 10 MG/ML IV BOLUS
INTRAVENOUS | Status: DC | PRN
  Administered 2019-05-02: 10 mg via INTRAVENOUS
  Administered 2019-05-02 (×2): 20 mg via INTRAVENOUS

## 2019-05-02 MED ORDER — FLUTTER DEVI: 1.0000 | Status: DC

## 2019-05-02 MED ORDER — CEFAZOLIN SODIUM-DEXTROSE 2-4 GM/100ML-% IV SOLN
2.0000 g | Freq: Four times a day (QID) | INTRAVENOUS | Status: AC
  Administered 2019-05-02 (×2): 2 g via INTRAVENOUS
  Filled 2019-05-02 (×2): qty 100

## 2019-05-02 MED ORDER — METOCLOPRAMIDE HCL 5 MG PO TABS: 5.0000 mg | ORAL_TABLET | Freq: Three times a day (TID) | ORAL | Status: DC | PRN

## 2019-05-02 MED ORDER — SODIUM CHLORIDE 0.9 % IR SOLN
Status: DC | PRN
  Administered 2019-05-02: 1000 mL

## 2019-05-02 MED ORDER — SPACER/AERO-HOLDING CHAMBERS DEVI: 1.0000 | Status: DC

## 2019-05-02 MED ORDER — SUCCINYLCHOLINE CHLORIDE 200 MG/10ML IV SOSY
PREFILLED_SYRINGE | INTRAVENOUS | Status: AC
  Filled 2019-05-02: qty 10

## 2019-05-02 MED ORDER — MOMETASONE FURO-FORMOTEROL FUM 200-5 MCG/ACT IN AERO
2.0000 | INHALATION_SPRAY | Freq: Two times a day (BID) | RESPIRATORY_TRACT | Status: DC
  Administered 2019-05-02 – 2019-05-03 (×2): 2 via RESPIRATORY_TRACT
  Filled 2019-05-02: qty 8.8

## 2019-05-02 MED ORDER — PROPOFOL 500 MG/50ML IV EMUL
INTRAVENOUS | Status: DC | PRN
  Administered 2019-05-02: 25 ug/kg/min via INTRAVENOUS

## 2019-05-02 MED ORDER — DEXAMETHASONE SODIUM PHOSPHATE 10 MG/ML IJ SOLN
10.0000 mg | Freq: Once | INTRAMUSCULAR | Status: AC
  Administered 2019-05-03: 10 mg via INTRAVENOUS
  Filled 2019-05-02: qty 1

## 2019-05-02 MED ORDER — MENTHOL 3 MG MT LOZG: 1.0000 | LOZENGE | OROMUCOSAL | Status: DC | PRN

## 2019-05-02 MED ORDER — OXYCODONE HCL 5 MG PO TABS: 5.0000 mg | ORAL_TABLET | Freq: Once | ORAL | Status: DC | PRN

## 2019-05-02 MED ORDER — POVIDONE-IODINE 10 % EX SWAB: 2.0000 "application " | Freq: Once | CUTANEOUS | Status: DC

## 2019-05-02 MED ORDER — BUPIVACAINE IN DEXTROSE 0.75-8.25 % IT SOLN: INTRATHECAL | Status: DC | PRN

## 2019-05-02 MED ORDER — ACETAMINOPHEN 160 MG/5ML PO SOLN: 325.0000 mg | ORAL | Status: DC | PRN

## 2019-05-02 MED ORDER — PRAVASTATIN SODIUM 20 MG PO TABS
20.0000 mg | ORAL_TABLET | Freq: Every day | ORAL | Status: DC
  Administered 2019-05-02 – 2019-05-03 (×2): 20 mg via ORAL
  Filled 2019-05-02 (×2): qty 1

## 2019-05-02 SURGICAL SUPPLY — 65 items
ADH SKN CLS APL DERMABOND .7 (GAUZE/BANDAGES/DRESSINGS) ×1
ATTUNE MED ANAT PAT 35 KNEE (Knees) ×1 IMPLANT
ATTUNE MED ANAT PAT 35MM KNEE (Knees) ×1 IMPLANT
ATTUNE PSFEM RTSZ5 NARCEM KNEE (Femur) ×2 IMPLANT
ATTUNE PSRP INSR SZ5 6 KNEE (Insert) ×1 IMPLANT
ATTUNE PSRP INSR SZ5 6MM KNEE (Insert) ×1 IMPLANT
BAG SPEC THK2 15X12 ZIP CLS (MISCELLANEOUS)
BAG ZIPLOCK 12X15 (MISCELLANEOUS) IMPLANT
BASE TIBIAL ROT PLAT SZ 5 KNEE (Knees) IMPLANT
BLADE SAW SGTL 11.0X1.19X90.0M (BLADE) ×2 IMPLANT
BLADE SAW SGTL 13.0X1.19X90.0M (BLADE) ×3 IMPLANT
BLADE SURG SZ10 CARB STEEL (BLADE) ×6 IMPLANT
BNDG ELASTIC 6X5.8 VLCR STR LF (GAUZE/BANDAGES/DRESSINGS) ×3 IMPLANT
BOWL SMART MIX CTS (DISPOSABLE) ×3 IMPLANT
BSPLAT TIB 5 CMNT ROT PLAT STR (Knees) ×1 IMPLANT
CEMENT HV SMART SET (Cement) ×4 IMPLANT
COVER SURGICAL LIGHT HANDLE (MISCELLANEOUS) ×3 IMPLANT
COVER WAND RF STERILE (DRAPES) IMPLANT
CUFF TOURN SGL QUICK 34 (TOURNIQUET CUFF) ×3
CUFF TRNQT CYL 34X4.125X (TOURNIQUET CUFF) ×1 IMPLANT
DECANTER SPIKE VIAL GLASS SM (MISCELLANEOUS) ×6 IMPLANT
DERMABOND ADVANCED (GAUZE/BANDAGES/DRESSINGS) ×2
DERMABOND ADVANCED .7 DNX12 (GAUZE/BANDAGES/DRESSINGS) ×1 IMPLANT
DRAPE U-SHAPE 47X51 STRL (DRAPES) ×3 IMPLANT
DRESSING AQUACEL AG SP 3.5X10 (GAUZE/BANDAGES/DRESSINGS) ×1 IMPLANT
DRSG AQUACEL AG SP 3.5X10 (GAUZE/BANDAGES/DRESSINGS) ×3
DURAPREP 26ML APPLICATOR (WOUND CARE) ×6 IMPLANT
ELECT REM PT RETURN 15FT ADLT (MISCELLANEOUS) ×3 IMPLANT
GLOVE BIO SURGEON STRL SZ 6 (GLOVE) ×3 IMPLANT
GLOVE BIOGEL PI IND STRL 6.5 (GLOVE) ×1 IMPLANT
GLOVE BIOGEL PI IND STRL 7.5 (GLOVE) ×1 IMPLANT
GLOVE BIOGEL PI IND STRL 8.5 (GLOVE) ×1 IMPLANT
GLOVE BIOGEL PI INDICATOR 6.5 (GLOVE) ×2
GLOVE BIOGEL PI INDICATOR 7.5 (GLOVE) ×2
GLOVE BIOGEL PI INDICATOR 8.5 (GLOVE) ×2
GLOVE ECLIPSE 8.0 STRL XLNG CF (GLOVE) ×3 IMPLANT
GLOVE ORTHO TXT STRL SZ7.5 (GLOVE) ×3 IMPLANT
GOWN STRL REUS W/ TWL LRG LVL3 (GOWN DISPOSABLE) ×1 IMPLANT
GOWN STRL REUS W/TWL 2XL LVL3 (GOWN DISPOSABLE) ×3 IMPLANT
GOWN STRL REUS W/TWL LRG LVL3 (GOWN DISPOSABLE) ×6 IMPLANT
HANDPIECE INTERPULSE COAX TIP (DISPOSABLE) ×3
HOLDER FOLEY CATH W/STRAP (MISCELLANEOUS) ×2 IMPLANT
KIT TURNOVER KIT A (KITS) IMPLANT
MANIFOLD NEPTUNE II (INSTRUMENTS) ×3 IMPLANT
NDL SAFETY ECLIPSE 18X1.5 (NEEDLE) IMPLANT
NEEDLE HYPO 18GX1.5 SHARP (NEEDLE) ×3
NS IRRIG 1000ML POUR BTL (IV SOLUTION) ×3 IMPLANT
PACK TOTAL KNEE CUSTOM (KITS) ×3 IMPLANT
PIN STEINMAN FIXATION KNEE (PIN) ×2 IMPLANT
PIN THREADED HEADED SIGMA (PIN) ×2 IMPLANT
PROTECTOR NERVE ULNAR (MISCELLANEOUS) ×3 IMPLANT
SET HNDPC FAN SPRY TIP SCT (DISPOSABLE) ×1 IMPLANT
SET PAD KNEE POSITIONER (MISCELLANEOUS) ×3 IMPLANT
SUT MNCRL AB 4-0 PS2 18 (SUTURE) ×3 IMPLANT
SUT STRATAFIX PDS+ 0 24IN (SUTURE) ×3 IMPLANT
SUT VIC AB 1 CT1 36 (SUTURE) ×3 IMPLANT
SUT VIC AB 2-0 CT1 27 (SUTURE) ×9
SUT VIC AB 2-0 CT1 TAPERPNT 27 (SUTURE) ×3 IMPLANT
SYR 3ML LL SCALE MARK (SYRINGE) ×3 IMPLANT
TIBIAL BASE ROT PLAT SZ 5 KNEE (Knees) ×3 IMPLANT
TRAY FOLEY MTR SLVR 14FR STAT (SET/KITS/TRAYS/PACK) ×2 IMPLANT
TRAY FOLEY MTR SLVR 16FR STAT (SET/KITS/TRAYS/PACK) ×3 IMPLANT
WATER STERILE IRR 1000ML POUR (IV SOLUTION) ×6 IMPLANT
WRAP KNEE MAXI GEL POST OP (GAUZE/BANDAGES/DRESSINGS) ×3 IMPLANT
YANKAUER SUCT BULB TIP 10FT TU (MISCELLANEOUS) ×3 IMPLANT

## 2019-05-02 NOTE — Evaluation (Addendum)
Physical Therapy Evaluation Patient Details Name: [name] MRN: [id number] DOB: 1946-03-13 Today's Date: 05/02/2019   History of Present Illness  Patient is 73 y.o. female s/p Rt TKA with PMH significant for seizures, brain tumor, craniotomy, pulmonary fibrosis, HTN, hypothyroidism, back surgery, and carpal tunnel release Rt UE.    Clinical Impression  [name] is a 73 y.o. female POD 0 s/p Rt TKA. Patient reports modified independence with SPC for mobility at baseline due to Lt foot drop. Patient is now limited by functional impairments (see PT problem list below) and requires min assist for transfers and gait with RW. Patient was able to ambulate ~80 feet with RW and min cues for walker use and 1 standing rest break. Patient instructed in exercise to facilitate ROM and circulation to manage edema. Patient will benefit from continued skilled PT interventions to address impairments and progress towards PLOF. Acute PT will follow to progress mobility and stair training in preparation for safe discharge home.      Follow Up Recommendations Follow surgeon's recommendation for DC plan and follow-up therapies    Equipment Recommendations  None recommended by PT    Recommendations for Other Services       Precautions / Restrictions Precautions Precautions: Fall Restrictions Weight Bearing Restrictions: No      Mobility  Bed Mobility Overal bed mobility: Needs Assistance Bed Mobility: Supine to Sit     Supine to sit: HOB elevated;Min assist     General bed mobility comments: pt using bed rails,, min assist for LE mobility  Transfers Overall transfer level: Needs assistance Equipment used: Rolling walker (2 wheeled) Transfers: Sit to/from Stand Sit to Stand: Min assist         General transfer comment: pt required cue ofr safe hand placement and technique with RW, assist to initiate power up  Ambulation/Gait Ambulation/Gait assistance: Min guard Gait Distance  (Feet): 80 Feet Assistive device: Rolling walker (2 wheeled) Gait Pattern/deviations: Step-to pattern;Decreased step length - right;Decreased stance time - right;Decreased step length - left;Decreased stride length;Decreased dorsiflexion - left;Decreased weight shift to right Gait velocity: decreased   General Gait Details: cues for safe hand placement initially and pt maintained throughout, guarding for safety, pt with no overt LOB and maintained good proximity to [employer]    Modified Rankin (Stroke Patients Only)       Balance Overall balance assessment: Needs assistance Sitting-balance support: Feet supported;No upper extremity supported Sitting balance-Leahy Scale: Good     Standing balance support: During functional activity;Bilateral upper extremity supported Standing balance-Leahy Scale: Poor            Pertinent Vitals/Pain Pain Assessment: Faces Faces Pain Scale: Hurts little more Pain Location: Rt knee Pain Descriptors / Indicators: Aching;Sore;Grimacing;Guarding Pain Intervention(s): Limited activity within patient's tolerance;Monitored during session;Repositioned;Ice applied    Home Living Family/patient expects to be discharged to:: Private residence Living Arrangements: Spouse/significant other Available Help at Discharge: Family Type of Home: House Home Access: Stairs to enter Entrance Stairs-Rails: Right;Can reach both Technical brewer of Steps: 3 with Rt rail at front, 3 with 2 rails from garage [place]: One level(1 step up into the kitchen) Home Equipment: Shower seat;Bedside commode;Walker - 2 wheels;Cane - single point      Prior Function Level of Independence: Independent         Comments: uses cane at baseline due to Lt foot drop which developed when she  had her brain tumor, pt has AFO at home but does not like to use it because it is uncomfortable     Hand Dominance   Dominant Hand:  Right    Extremity/Trunk Assessment   Upper Extremity Assessment Upper Extremity Assessment: Overall WFL for tasks assessed    Lower Extremity Assessment Lower Extremity Assessment: LLE deficits/detail;RLE deficits/detail RLE Deficits / Details: pt with good quad activaiton and no extensor lag with SLR, able to maintain extension with resistance, 4-/5 or greater RLE Sensation: WNL RLE Coordination: WNL LLE Deficits / Details: pt with Lt foot drop, 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 LLE Coordination: WNL       Communication   Communication: No difficulties  Cognition Arousal/Alertness: Awake/alert Behavior During Therapy: WFL for tasks assessed/performed Overall Cognitive Status: Within Functional Limits for tasks assessed             General Comments      Exercises Total Joint Exercises Ankle Circles/Pumps: AROM;10 reps;Seated;Both Quad Sets: AROM;10 reps;Supine;Right;Seated(1x5 seated, 1x5 supine) Heel Slides: AROM;10 reps;Seated;Right   Assessment/Plan    PT Assessment Patient needs continued PT services  PT Problem List Decreased strength;Decreased balance;Decreased knowledge of use of DME;Decreased mobility;Decreased range of motion;Decreased activity tolerance;Pain       PT Treatment Interventions DME instruction;Functional mobility training;Balance training;Patient/family education;Modalities;Therapeutic activities;Gait training;Stair training;Therapeutic exercise    PT Goals (Current goals can be found in the Care Plan section)  Acute Rehab PT Goals Patient Stated Goal: to return home and walk without pain PT Goal Formulation: With patient Time For Goal Achievement: 05/09/19 Potential to Achieve Goals: Good    Frequency 7X/week    AM-PAC PT "6 Clicks" Mobility  Outcome Measure Help needed turning from your back to your side while in a flat bed without using bedrails?: A Little Help needed moving from lying on your back to sitting on the side of a flat bed  without using bedrails?: A Little Help needed moving to and from a bed to a chair (including a wheelchair)?: A Little Help needed standing up from a chair using your arms (e.g., wheelchair or bedside chair)?: A Little Help needed to walk in hospital room?: A Little Help needed climbing 3-5 steps with a railing? : A Little 6 Click Score: 18    End of Session Equipment Utilized During Treatment: Gait belt Activity Tolerance: Patient tolerated treatment well Patient left: in chair;with call bell/phone within reach;with family/visitor present;with chair alarm set Nurse Communication: Mobility status PT Visit Diagnosis: Unsteadiness on feet (R26.81);Other abnormalities of gait and mobility (R26.89);Muscle weakness (generalized) (M62.81);Difficulty in walking, not elsewhere classified (R26.2)    Time: [id number] PT Time Calculation (min) (ACUTE ONLY): 37 min   Charges:   PT Evaluation $PT Eval Moderate Complexity: 1 Mod PT Treatments $Therapeutic Exercise: 8-22 mins       Kipp Brood, PT, DPT, [facility] Physical Therapist with [facility]  05/02/2019 3:19 PM

## 2019-05-02 NOTE — Anesthesia Procedure Notes (Signed)
Spinal  Patient location during procedure: OR Start time: 05/02/2019 7:20 AM End time: 05/02/2019 7:25 AM Staffing Anesthesiologist: Janeece Riggers, MD Preanesthetic Checklist Completed: patient identified, site marked, surgical consent, pre-op evaluation, timeout performed, IV checked, risks and benefits discussed and monitors and equipment checked Spinal Block Patient position: sitting Prep: DuraPrep Patient monitoring: heart rate, cardiac monitor, continuous pulse ox and blood pressure Approach: midline Location: L2-3 Injection technique: single-shot Needle Needle type: Sprotte  Needle gauge: 24 G Needle length: 9 cm Assessment Sensory level: T4

## 2019-05-02 NOTE — Plan of Care (Signed)
Continue with current POC

## 2019-05-02 NOTE — Op Note (Signed)
NAME:  [name] RECORD NO.:  [id number]                             FACILITY:  [facility]      PHYSICIAN:  Pietro Cassis. Alvan Dame, M.D.  DATE OF BIRTH:  1945/12/27      DATE OF PROCEDURE:  05/02/2019                                     OPERATIVE REPORT         PREOPERATIVE DIAGNOSIS:  Right knee osteoarthritis.      POSTOPERATIVE DIAGNOSIS:  Right knee osteoarthritis.      FINDINGS:  The patient was noted to have complete loss of cartilage and   bone-on-bone arthritis with associated osteophytes in the lateral and patellofemoral compartments of   the knee with a significant synovitis and associated effusion.  The patient had failed months of conservative treatment including medications, injection therapy, activity modification.     PROCEDURE:  Right total knee replacement.      COMPONENTS USED:  DePuy Attune rotating platform posterior stabilized knee   system, a size 5N femur, 5 tibia, size 6 mm PS AOX insert, and 35 anatomic patellar   button.      SURGEON:  Pietro Cassis. Alvan Dame, M.D.      ASSISTANT:  Danae Orleans, PA-C.      ANESTHESIA:  Regional and Spinal.      SPECIMENS:  None.      COMPLICATION:  None.      DRAINS:  None.  EBL: <100cc      TOURNIQUET TIME:   Total Tourniquet Time Documented: Thigh (Right) - 31 minutes Total: Thigh (Right) - 31 minutes  .      The patient was stable to the recovery room.      INDICATION FOR PROCEDURE:  LANEKA Brady is a 73 y.o. female patient of   mine.  The patient had been seen, evaluated, and treated for months conservatively in the   office with medication, activity modification, and injections.  The patient had   radiographic changes of bone-on-bone arthritis with endplate sclerosis and osteophytes noted.  Based on the radiographic changes and failed conservative measures, the patient   decided to proceed with definitive treatment, total knee replacement.  Risks of infection, DVT, component failure,  need for revision surgery, neurovascular injury were reviewed in the office setting.  The postop course was reviewed stressing the efforts to maximize post-operative satisfaction and function.  Consent was obtained for benefit of pain   relief.      PROCEDURE IN DETAIL:  The patient was brought to the operative theater.   Once adequate anesthesia, preoperative antibiotics, 2 gm of Ancef,1 gm of Tranexamic Acid, and 10 mg of Decadron administered, the patient was positioned supine with a right thigh tourniquet placed.  The  right lower extremity was prepped and draped in sterile fashion.  A time-   out was performed identifying the patient, planned procedure, and the appropriate extremity.      The right lower extremity was placed in the [facility] leg holder.  The leg was   exsanguinated, tourniquet elevated to 250 mmHg.  A midline incision was   made  followed by median parapatellar arthrotomy.  Following initial   exposure, attention was first directed to the patella.  Precut   measurement was noted to be 24 mm.  I resected down to 14 mm and used a   35 anatomic patellar button to restore patellar height as well as cover the cut surface.      The lug holes were drilled and a metal shim was placed to protect the   patella from retractors and saw blade during the procedure.      At this point, attention was now directed to the femur.  The femoral   canal was opened with a drill, irrigated to try to prevent fat emboli.  An   intramedullary rod was passed at 3 degrees valgus, 11 mm of bone was   resected off the distal femur.  Following this resection, the tibia was   subluxated anteriorly.  Using the extramedullary guide, 3 mm of bone was resected off   the proximal lateral tibia.  We confirmed the gap would be   stable medially and laterally with a size 5 spacer block as well as confirmed that the tibial cut was perpendicular in the coronal plane, checking with an alignment rod.      Once this  was done, I sized the femur to be a size 5 in the anterior-   posterior dimension, chose a narrow component based on medial and   lateral dimension.  The size 5 rotation block was then pinned in   position anterior referenced using the C-clamp to set rotation.  The   anterior, posterior, and  chamfer cuts were made without difficulty nor   notching making certain that I was along the anterior cortex to help   with flexion gap stability.      The final box cut was made off the lateral aspect of distal femur.      At this point, the tibia was sized to be a size 5.  The size 5 tray was   then pinned in position through the medial third of the tubercle,   drilled, and keel punched.  Trial reduction was now carried with a 5 femur,  5 tibia, a size 6 mm PS insert, and the 35 anatomic patella botton.  The knee was brought to full extension with good flexion stability with the patella   tracking through the trochlea without application of pressure.  Given   all these findings the trial components removed.  Final components were   opened and cement was mixed.  The knee was irrigated with normal saline solution and pulse lavage.  The synovial lining was   then injected with 30 cc of 0.25% Marcaine with epinephrine, 1 cc of Toradol and 30 cc of NS for a total of 61 cc.     Final implants were then cemented onto cleaned and dried cut surfaces of bone with the knee brought to extension with a size 6 mm PS trial insert.      Once the cement had fully cured, excess cement was removed   throughout the knee.  I confirmed that I was satisfied with the range of   motion and stability, and the final size 6 mm PS AOX insert was chosen.  It was   placed into the knee.      The tourniquet had been let down at 31 minutes.  No significant   hemostasis was required.  The extensor mechanism was then reapproximated using #1 Vicryl and #  1 Stratafix sutures with the knee   in flexion.  The   remaining wound was  closed with 2-0 Vicryl and running 4-0 Monocryl.   The knee was cleaned, dried, dressed sterilely using Dermabond and   Aquacel dressing.  The patient was then   brought to recovery room in stable condition, tolerating the procedure   well.   Please note that Physician Assistant, Danae Orleans, PA-C was present for the entirety of the case, and was utilized for pre-operative positioning, peri-operative retractor management, general facilitation of the procedure and for primary wound closure at the end of the case.              Pietro Cassis Alvan Dame, M.D.    05/02/2019 8:44 AM

## 2019-05-02 NOTE — Anesthesia Procedure Notes (Signed)
Anesthesia Regional Block: Femoral nerve block   Pre-Anesthetic Checklist: ,, timeout performed, Correct Patient, Correct Site, Correct Laterality, Correct Procedure, Correct Position, site marked, Risks and benefits discussed,  Surgical consent,  Pre-op evaluation,  At surgeon's request and post-op pain management  Laterality: Right  Prep: chloraprep       Needles:  Injection technique: Single-shot  Needle Type: Echogenic Stimulator Needle     Needle Length: 5cm  Needle Gauge: 22     Additional Needles:   Procedures:, nerve stimulator,,, ultrasound used (permanent image in chart),,,,  Narrative:  Start time: 05/02/2019 7:10 AM End time: 05/02/2019 7:15 AM Injection made incrementally with aspirations every 5 mL.  Performed by: Personally  Anesthesiologist: Janeece Riggers, MD  Additional Notes: Functioning IV was confirmed and monitors were applied.  A 78mm 22ga Arrow echogenic stimulator needle was used. Sterile prep and drape,hand hygiene and sterile gloves were used. Ultrasound guidance: relevant anatomy identified, needle position confirmed, local anesthetic spread visualized around nerve(s)., vascular puncture avoided.  Image printed for medical record. Negative aspiration and negative test dose prior to incremental administration of local anesthetic. The patient tolerated the procedure well.

## 2019-05-02 NOTE — Transfer of Care (Signed)
Immediate Anesthesia Transfer of Care Note  Patient: AVAGAIL WHITTLESEY  Procedure(s) Performed: TOTAL KNEE ARTHROPLASTY (Right Knee)  Patient Location: PACU  Anesthesia Type:Spinal and MAC combined with regional for post-op pain  Level of Consciousness: awake, alert , oriented and patient cooperative  Airway & Oxygen Therapy: Patient Spontanous Breathing and Patient connected to face mask oxygen  Post-op Assessment: Report given to RN and Post -op Vital signs reviewed and stable  Post vital signs: Reviewed and stable  Last Vitals:  Vitals Value Taken Time  BP 100/66 05/02/19 0913  Temp    Pulse 67 05/02/19 0915  Resp 14 05/02/19 0915  SpO2 100 % 05/02/19 0915  Vitals shown include unvalidated device data.  Last Pain:  Vitals:   05/02/19 0608  TempSrc:   PainSc: 0-No pain      Patients Stated Pain Goal: 4 (70/48/88 [id number])  Complications: No apparent anesthesia complications

## 2019-05-02 NOTE — Interval H&P Note (Signed)
History and Physical Interval Note:  05/02/2019 7:10 AM  [name]  has presented today for surgery, with the diagnosis of Right knee osteoarthritis.  The various methods of treatment have been discussed with the patient and family. After consideration of risks, benefits and other options for treatment, the patient has consented to  Procedure(s) with comments: TOTAL KNEE ARTHROPLASTY (Right) - 70 mins as a surgical intervention.  The patient's history has been reviewed, patient examined, no change in status, stable for surgery.  I have reviewed the patient's chart and labs.  Questions were answered to the patient's satisfaction.     Mauri Pole

## 2019-05-02 NOTE — Anesthesia Postprocedure Evaluation (Signed)
Anesthesia Post Note  Patient: [name]  Procedure(s) Performed: TOTAL KNEE ARTHROPLASTY (Right Knee)     Patient location during evaluation: PACU Anesthesia Type: MAC Level of consciousness: oriented and awake and alert Pain management: pain level controlled Vital Signs Assessment: post-procedure vital signs reviewed and stable Respiratory status: spontaneous breathing, respiratory function stable and patient connected to nasal cannula oxygen Cardiovascular status: blood pressure returned to baseline and stable Postop Assessment: no headache, no backache and no apparent nausea or vomiting Anesthetic complications: no    Last Vitals:  Vitals:   05/02/19 1158 05/02/19 1545  BP: 116/67 135/75  Pulse: 65 68  Resp: 18 18  Temp: 36.6 C 36.5 C  SpO2: 99% 97%    Last Pain:  Vitals:   05/02/19 1545  TempSrc: Oral  PainSc:                  [name]

## 2019-05-02 NOTE — Discharge Instructions (Signed)

## 2019-05-03 ENCOUNTER — Encounter (HOSPITAL_COMMUNITY): Payer: Self-pay | Admitting: Orthopedic Surgery

## 2019-05-03 DIAGNOSIS — E669 Obesity, unspecified: Secondary | ICD-10-CM | POA: Diagnosis present

## 2019-05-03 DIAGNOSIS — M1711 Unilateral primary osteoarthritis, right knee: Secondary | ICD-10-CM | POA: Diagnosis not present

## 2019-05-03 LAB — CBC
HCT: 34.4 % — ABNORMAL LOW (ref 36.0–46.0)
Hemoglobin: 10.3 g/dL — ABNORMAL LOW (ref 12.0–15.0)
MCH: 27.8 pg (ref 26.0–34.0)
MCHC: 29.9 g/dL — ABNORMAL LOW (ref 30.0–36.0)
MCV: 92.7 fL (ref 80.0–100.0)
Platelets: 225 10*3/uL (ref 150–400)
RBC: 3.71 MIL/uL — ABNORMAL LOW (ref 3.87–5.11)
RDW: 22.5 % — ABNORMAL HIGH (ref 11.5–15.5)
WBC: 12.5 10*3/uL — ABNORMAL HIGH (ref 4.0–10.5)
nRBC: 0 % (ref 0.0–0.2)

## 2019-05-03 LAB — BASIC METABOLIC PANEL
Anion gap: 8 (ref 5–15)
BUN: 28 mg/dL — ABNORMAL HIGH (ref 8–23)
CO2: 26 mmol/L (ref 22–32)
Calcium: 9.7 mg/dL (ref 8.9–10.3)
Chloride: 103 mmol/L (ref 98–111)
Creatinine, Ser: 0.78 mg/dL (ref 0.44–1.00)
GFR calc Af Amer: >60 mL/min (ref >60)
GFR calc non Af Amer: >60 mL/min (ref >60)
Glucose, Bld: 132 mg/dL — ABNORMAL HIGH (ref 70–99)
Potassium: 3.8 mmol/L (ref 3.5–5.1)
Sodium: 137 mmol/L (ref 135–145)

## 2019-05-03 MED ORDER — HYDROMORPHONE HCL 2 MG PO TABS
2.0000 mg | ORAL_TABLET | ORAL | Status: DC | PRN
  Administered 2019-05-03: 12:00:00 2 mg via ORAL
  Filled 2019-05-03: qty 1

## 2019-05-03 MED ORDER — HYDROMORPHONE HCL 2 MG PO TABS: 2.0000 mg | ORAL_TABLET | ORAL | 0 refills | Status: AC | PRN

## 2019-05-03 MED ORDER — ASPIRIN 81 MG PO CHEW: 81.0000 mg | CHEWABLE_TABLET | Freq: Two times a day (BID) | ORAL | 0 refills | Status: AC

## 2019-05-03 MED ORDER — POLYETHYLENE GLYCOL 3350 17 G PO PACK: 17.0000 g | PACK | Freq: Two times a day (BID) | ORAL | 0 refills | Status: AC

## 2019-05-03 MED ORDER — DOCUSATE SODIUM 100 MG PO CAPS: 100.0000 mg | ORAL_CAPSULE | Freq: Two times a day (BID) | ORAL | 0 refills | Status: AC

## 2019-05-03 MED ORDER — METHOCARBAMOL 500 MG PO TABS: 500.0000 mg | ORAL_TABLET | Freq: Four times a day (QID) | ORAL | 0 refills | Status: AC | PRN

## 2019-05-03 MED ORDER — ACETAMINOPHEN 500 MG PO TABS: 1000.0000 mg | ORAL_TABLET | Freq: Three times a day (TID) | ORAL | 0 refills | Status: AC

## 2019-05-03 NOTE — Progress Notes (Addendum)
     Subjective: 1 Day Post-Op Procedure(s) (LRB): TOTAL KNEE ARTHROPLASTY (Right)   Seen by Dr. Alvan Dame. Patient reports pain as moderate, pain not fully controlled on oxycodone.  She is previously used Dilaudid after a back surgery and did well on that medication.  No reported events throughout the night.  Patient is ready be discharged home, she does well with therapy.   Patient's anticipated LOS is less than 2 midnights, meeting these requirements: - Lives within 1 hour of care - Has a competent adult at home to recover with post-op recover - NO history of  - Chronic pain requiring opiods  - Diabetes  - Coronary Artery Disease  - Heart failure  - Heart attack  - Stroke  - DVT/VTE  - Cardiac arrhythmia  - Respiratory Failure/COPD  - Renal failure  - Anemia  - Advanced Liver disease       Objective:   VITALS:   Vitals:   05/03/19 0047 05/03/19 0437  BP: 128/70 116/65  Pulse: 67 64  Resp: 16 14  Temp: 97.8 F (36.6 C) 98.2 F (36.8 C)  SpO2: 99% 100%    Dorsiflexion/Plantar flexion intact Incision: dressing C/D/I No cellulitis present Compartment soft  LABS Recent Labs    05/03/19 0208  HGB 10.3*  HCT 34.4*  WBC 12.5*  PLT 225    Recent Labs    05/03/19 0208  NA 137  K 3.8  BUN 28*  CREATININE 0.78  GLUCOSE 132*     Assessment/Plan: 1 Day Post-Op Procedure(s) (LRB): TOTAL KNEE ARTHROPLASTY (Right) Foley cath DC'd Advance diet Up with therapy D/C IV fluids Discharge home Follow up in 2 weeks at [facility] ([place] Follow up with OLIN,Romain Erion D in 2 weeks.  Contact information:  [facility]) [address], [place] [id number]    Obese (BMI 30-39.9) Estimated body mass index is 34.21 kg/m as calculated from the following:   Height as of this encounter: 5\' 4"  (1.626 m).   Weight as of this encounter: 90.4 kg. Patient also counseled that  weight may inhibit the healing process Patient counseled that losing weight will help with future health issues     West Pugh. Sameen Leas   PAC  05/03/2019, 7:48 AM

## 2019-05-03 NOTE — Progress Notes (Signed)
Pt was provided with d/c instructions. After discussing the pt's plan of care upon d/c home, the pt reported no further questions or concerns.  

## 2019-05-03 NOTE — Progress Notes (Signed)
Physical Therapy Treatment Patient Details Name: [name] MRN: [id number] DOB: June 11, 1946 Today's Date: 05/03/2019    History of Present Illness Patient is 73 y.o. female s/p Rt TKA with PMH significant for seizures, brain tumor, craniotomy, pulmonary fibrosis, HTN, hypothyroidism, back surgery, and carpal tunnel release Rt UE.    PT Comments    Progressing with mobility. Reviewed exercises, gait training, and stair training. Issued HEP for pt to perform 2x/day until OP PT begins. Okay to d/c from PT standpoint.    Follow Up Recommendations  Follow surgeon's recommendation for DC plan and follow-up therapies     Equipment Recommendations  None recommended by PT    Recommendations for Other Services       Precautions / Restrictions Precautions Precautions: Fall Restrictions Weight Bearing Restrictions: No    Mobility  Bed Mobility Overal bed mobility: Needs Assistance             General bed mobility comments: oob in recliner  Transfers Overall transfer level: Needs assistance Equipment used: Rolling walker (2 wheeled) Transfers: Sit to/from Stand Sit to Stand: Min guard;Min assist         General transfer comment: x1. On 1st attempt, posterior bias with near LOB. Stood a 2nd time with cues for foot placement and "knees over toes"-better execution and stability  Ambulation/Gait Ambulation/Gait assistance: Counsellor (Feet): 75 Feet Assistive device: Rolling walker (2 wheeled) Gait Pattern/deviations: Step-to pattern;Decreased step length - right;Decreased stance time - right;Decreased step length - left;Decreased stride length;Decreased dorsiflexion - left;Decreased weight shift to right     General Gait Details: Close guarding for safety-had husband practice guarding pt with use of gait belt. Slow gait speed. VCs safety, sequence, technique.   Stairs Stairs: Yes Stairs assistance: Min guard;Min assist Stair Management: Forwards;Step to  pattern;Two rails Number of Stairs: 2 General stair comments: up and over portable stairs x 1. Husband practiced assisting/guarding pt. VCs safety, technique, sequence.   Wheelchair Mobility    Modified Rankin (Stroke Patients Only)       Balance Overall balance assessment: Needs assistance         Standing balance support: Bilateral upper extremity supported Standing balance-Leahy Scale: Poor                              Cognition Arousal/Alertness: Awake/alert Behavior During Therapy: WFL for tasks assessed/performed Overall Cognitive Status: Within Functional Limits for tasks assessed                                        Exercises Total Joint Exercises Ankle Circles/Pumps: AROM;10 reps;Seated;Both Quad Sets: AROM;10 reps;Supine;Right;Seated Hip ABduction/ADduction: AROM;Right;10 reps;Seated Straight Leg Raises: AAROM;Right;10 reps;Supine Knee Flexion: AAROM;Right;10 reps;Seated Goniometric ROM: ~5-80 degrees    General Comments        Pertinent Vitals/Pain Pain Assessment: 0-10 Pain Score: 8  Pain Location: R knee Pain Descriptors / Indicators: Aching;Sore Pain Intervention(s): Monitored during session    Home Living                      Prior Function            PT Goals (current goals can now be found in the care plan section) Progress towards PT goals: Progressing toward goals    Frequency    7X/week  PT Plan Current plan remains appropriate    Co-evaluation              AM-PAC PT "6 Clicks" Mobility   Outcome Measure  Help needed turning from your back to your side while in a flat bed without using bedrails?: A Little Help needed moving from lying on your back to sitting on the side of a flat bed without using bedrails?: A Little Help needed moving to and from a bed to a chair (including a wheelchair)?: A Little Help needed standing up from a chair using your arms (e.g., wheelchair or  bedside chair)?: A Little Help needed to walk in hospital room?: A Little Help needed climbing 3-5 steps with a railing? : A Little 6 Click Score: 18    End of Session Equipment Utilized During Treatment: Gait belt Activity Tolerance: Patient tolerated treatment well Patient left: in chair;with call bell/phone within reach;with family/visitor present   PT Visit Diagnosis: Unsteadiness on feet (R26.81);Other abnormalities of gait and mobility (R26.89);Muscle weakness (generalized) (M62.81);Difficulty in walking, not elsewhere classified (R26.2)     Time: [id number] PT Time Calculation (min) (ACUTE ONLY): 45 min  Charges:  $Gait Training: 23-37 mins $Therapeutic Exercise: 8-22 mins                       Weston Anna, PT Acute Rehabilitation Services Pager: [phone] Office: [phone]

## 2019-05-05 ENCOUNTER — Telehealth: Payer: Self-pay | Admitting: Internal Medicine

## 2019-05-05 NOTE — Telephone Encounter (Signed)
Pt had knee replacement surgery last Tuesday. Reports she has not had a BM since Saturday. Pt has only taken stool softeners. Pt wants to know what she can take. Discussed with pt that she can drink 1/2 a bottle of mag citrate and if no BM in 1 hour drink the other half of the bottle. Pt verbalized understanding.

## 2019-05-09 ENCOUNTER — Inpatient Hospital Stay (HOSPITAL_COMMUNITY)
Admission: EM | Admit: 2019-05-09 | Discharge: 2019-05-13 | DRG: 193 | Disposition: A | Discharge status: Home / Self Care | Payer: Medicare Other | Attending: Internal Medicine | Admitting: Internal Medicine

## 2019-05-09 ENCOUNTER — Encounter (HOSPITAL_COMMUNITY): Payer: Self-pay

## 2019-05-09 ENCOUNTER — Other Ambulatory Visit: Payer: Self-pay

## 2019-05-09 ENCOUNTER — Emergency Department (HOSPITAL_COMMUNITY): Payer: Medicare Other

## 2019-05-09 DIAGNOSIS — J9601 Acute respiratory failure with hypoxia: Secondary | ICD-10-CM | POA: Diagnosis present

## 2019-05-09 DIAGNOSIS — D500 Iron deficiency anemia secondary to blood loss (chronic): Secondary | ICD-10-CM | POA: Diagnosis not present

## 2019-05-09 DIAGNOSIS — G250 Essential tremor: Secondary | ICD-10-CM | POA: Diagnosis present

## 2019-05-09 DIAGNOSIS — J45909 Unspecified asthma, uncomplicated: Secondary | ICD-10-CM | POA: Diagnosis present

## 2019-05-09 DIAGNOSIS — J189 Pneumonia, unspecified organism: Secondary | ICD-10-CM | POA: Diagnosis present

## 2019-05-09 DIAGNOSIS — Z7982 Long term (current) use of aspirin: Secondary | ICD-10-CM

## 2019-05-09 DIAGNOSIS — E785 Hyperlipidemia, unspecified: Secondary | ICD-10-CM | POA: Diagnosis present

## 2019-05-09 DIAGNOSIS — J9621 Acute and chronic respiratory failure with hypoxia: Secondary | ICD-10-CM | POA: Clinically undetermined

## 2019-05-09 DIAGNOSIS — Z20828 Contact with and (suspected) exposure to other viral communicable diseases: Secondary | ICD-10-CM | POA: Diagnosis present

## 2019-05-09 DIAGNOSIS — Z7951 Long term (current) use of inhaled steroids: Secondary | ICD-10-CM | POA: Diagnosis not present

## 2019-05-09 DIAGNOSIS — J8410 Pulmonary fibrosis, unspecified: Secondary | ICD-10-CM | POA: Diagnosis present

## 2019-05-09 DIAGNOSIS — Z800 Family history of malignant neoplasm of digestive organs: Secondary | ICD-10-CM

## 2019-05-09 DIAGNOSIS — Z885 Allergy status to narcotic agent status: Secondary | ICD-10-CM

## 2019-05-09 DIAGNOSIS — Z86011 Personal history of benign neoplasm of the brain: Secondary | ICD-10-CM

## 2019-05-09 DIAGNOSIS — E039 Hypothyroidism, unspecified: Secondary | ICD-10-CM | POA: Diagnosis present

## 2019-05-09 DIAGNOSIS — Z91013 Allergy to seafood: Secondary | ICD-10-CM | POA: Diagnosis not present

## 2019-05-09 DIAGNOSIS — F329 Major depressive disorder, single episode, unspecified: Secondary | ICD-10-CM | POA: Diagnosis present

## 2019-05-09 DIAGNOSIS — Z882 Allergy status to sulfonamides status: Secondary | ICD-10-CM

## 2019-05-09 DIAGNOSIS — K648 Other hemorrhoids: Secondary | ICD-10-CM | POA: Diagnosis present

## 2019-05-09 DIAGNOSIS — J849 Interstitial pulmonary disease, unspecified: Secondary | ICD-10-CM | POA: Diagnosis not present

## 2019-05-09 DIAGNOSIS — D62 Acute posthemorrhagic anemia: Secondary | ICD-10-CM | POA: Diagnosis present

## 2019-05-09 DIAGNOSIS — Z79899 Other long term (current) drug therapy: Secondary | ICD-10-CM

## 2019-05-09 DIAGNOSIS — I10 Essential (primary) hypertension: Secondary | ICD-10-CM | POA: Diagnosis present

## 2019-05-09 DIAGNOSIS — Z96651 Presence of right artificial knee joint: Secondary | ICD-10-CM

## 2019-05-09 DIAGNOSIS — K219 Gastro-esophageal reflux disease without esophagitis: Secondary | ICD-10-CM | POA: Diagnosis present

## 2019-05-09 DIAGNOSIS — K5520 Angiodysplasia of colon without hemorrhage: Secondary | ICD-10-CM | POA: Diagnosis present

## 2019-05-09 DIAGNOSIS — K52832 Lymphocytic colitis: Secondary | ICD-10-CM | POA: Diagnosis present

## 2019-05-09 DIAGNOSIS — R509 Fever, unspecified: Secondary | ICD-10-CM | POA: Diagnosis present

## 2019-05-09 LAB — SARS CORONAVIRUS 2 BY RT PCR (HOSPITAL ORDER, PERFORMED IN ~~LOC~~ HOSPITAL LAB): SARS Coronavirus 2: NEGATIVE

## 2019-05-09 LAB — CBC WITH DIFFERENTIAL/PLATELET
Abs Immature Granulocytes: 0.08 10*3/uL — ABNORMAL HIGH (ref 0.00–0.07)
Basophils Absolute: 0.1 10*3/uL (ref 0.0–0.1)
Basophils Relative: 0 %
Eosinophils Absolute: 0.2 10*3/uL (ref 0.0–0.5)
Eosinophils Relative: 1 %
HCT: 28.7 % — ABNORMAL LOW (ref 36.0–46.0)
Hemoglobin: 8.8 g/dL — ABNORMAL LOW (ref 12.0–15.0)
Immature Granulocytes: 1 %
Lymphocytes Relative: 8 %
Lymphs Abs: 1.1 10*3/uL (ref 0.7–4.0)
MCH: 28.1 pg (ref 26.0–34.0)
MCHC: 30.7 g/dL (ref 30.0–36.0)
MCV: 91.7 fL (ref 80.0–100.0)
Monocytes Absolute: 1.1 10*3/uL — ABNORMAL HIGH (ref 0.1–1.0)
Monocytes Relative: 8 %
Neutro Abs: 11.3 10*3/uL — ABNORMAL HIGH (ref 1.7–7.7)
Neutrophils Relative %: 82 %
Platelets: 246 10*3/uL (ref 150–400)
RBC: 3.13 MIL/uL — ABNORMAL LOW (ref 3.87–5.11)
RDW: 22.6 % — ABNORMAL HIGH (ref 11.5–15.5)
WBC: 13.9 10*3/uL — ABNORMAL HIGH (ref 4.0–10.5)
nRBC: 0 % (ref 0.0–0.2)

## 2019-05-09 LAB — URINALYSIS, ROUTINE W REFLEX MICROSCOPIC
Bilirubin Urine: NEGATIVE
Glucose, UA: NEGATIVE mg/dL
Hgb urine dipstick: NEGATIVE
Ketones, ur: 5 mg/dL — AB
Leukocytes,Ua: NEGATIVE
Nitrite: NEGATIVE
Protein, ur: NEGATIVE mg/dL
Specific Gravity, Urine: 1.015 (ref 1.005–1.030)
pH: 6 (ref 5.0–8.0)

## 2019-05-09 LAB — COMPREHENSIVE METABOLIC PANEL
ALT: 31 U/L (ref 0–44)
AST: 35 U/L (ref 15–41)
Albumin: 3.4 g/dL — ABNORMAL LOW (ref 3.5–5.0)
Alkaline Phosphatase: 59 U/L (ref 38–126)
Anion gap: 8 (ref 5–15)
BUN: 16 mg/dL (ref 8–23)
CO2: 24 mmol/L (ref 22–32)
Calcium: 9.8 mg/dL (ref 8.9–10.3)
Chloride: 103 mmol/L (ref 98–111)
Creatinine, Ser: 0.76 mg/dL (ref 0.44–1.00)
GFR calc Af Amer: >60 mL/min (ref >60)
GFR calc non Af Amer: >60 mL/min (ref >60)
Glucose, Bld: 169 mg/dL — ABNORMAL HIGH (ref 70–99)
Potassium: 3.2 mmol/L — ABNORMAL LOW (ref 3.5–5.1)
Sodium: 135 mmol/L (ref 135–145)
Total Bilirubin: 0.6 mg/dL (ref 0.3–1.2)
Total Protein: 6.1 g/dL — ABNORMAL LOW (ref 6.5–8.1)

## 2019-05-09 LAB — PROTIME-INR
INR: 1 (ref 0.8–1.2)
Prothrombin Time: 13.1 seconds (ref 11.4–15.2)

## 2019-05-09 LAB — APTT: aPTT: 26 seconds (ref 24–36)

## 2019-05-09 LAB — LACTIC ACID, PLASMA: Lactic Acid, Venous: 1 mmol/L (ref 0.5–1.9)

## 2019-05-09 MED ORDER — VANCOMYCIN HCL 10 G IV SOLR
2000.0000 mg | Freq: Once | INTRAVENOUS | Status: AC
  Administered 2019-05-09: 2000 mg via INTRAVENOUS
  Filled 2019-05-09: qty 2000

## 2019-05-09 MED ORDER — SODIUM CHLORIDE 0.9 % IV SOLN
2.0000 g | Freq: Once | INTRAVENOUS | Status: AC
  Administered 2019-05-09: 2 g via INTRAVENOUS
  Filled 2019-05-09: qty 2

## 2019-05-09 MED ORDER — METRONIDAZOLE IN NACL 5-0.79 MG/ML-% IV SOLN
500.0000 mg | Freq: Once | INTRAVENOUS | Status: AC
  Administered 2019-05-09: 500 mg via INTRAVENOUS
  Filled 2019-05-09: qty 100

## 2019-05-09 MED ORDER — VANCOMYCIN HCL IN DEXTROSE 1-5 GM/200ML-% IV SOLN: 1000.0000 mg | Freq: Once | INTRAVENOUS | Status: DC

## 2019-05-09 NOTE — Progress Notes (Signed)
A consult was received from an ED physician for Vancomycin & Cefepime per pharmacy dosing.  The patient's profile has been reviewed for ht/wt/allergies/indication/available labs.   A one time order has been placed for Vancomycin 2gm & Cefepime 2gm IV.  Further antibiotics/pharmacy consults should be ordered by admitting physician if indicated.                       Thank you, Netta Cedars, PharmD, BCPS 05/09/2019  10:28 PM

## 2019-05-09 NOTE — Discharge Summary (Signed)
Physician Discharge Summary  Patient ID: [name] MRN: [id number] DOB/AGE: 74-Jun-1947 74 y.o.  Admit date: 05/02/2019 Discharge date: 05/03/2019   Procedures:  Procedure(s) (LRB): TOTAL KNEE ARTHROPLASTY (Right)  Attending Physician:  Dr. Paralee Cancel   Admission Diagnoses:   Right knee primary OA / pain  Discharge Diagnoses:  Active Problems:   S/P right TKA   Obese  Past Medical History:  Diagnosis Date   Anxiety state, unspecified    Benign neoplasm of colon    Brain tumor ([place])    Cancer ([facility])    Colon polyps    hyperplastic   Complication of anesthesia    requests dilaudid for pain   Complications affecting other specified body systems, hypertension    No cardiologist    Contact lens/glasses fitting    wears glassses or contacts   Depressive disorder, not elsewhere classified    Dyspnea    due to fibrosis   Esophageal reflux    Esophageal ring    Family history of colon cancer mother   Fundic gland polyps of stomach, benign    Hemorrhoids    Hiatal hernia    History of kidney stones    Hypertension    Hypothyroidism    Lymphocytic colitis    Obesity    Other and unspecified hyperlipidemia    Pneumonia 02/28/2019   Pulmonary fibrosis ([place])    Seizures ([place])    x 1    HPI:    [name], 73 y.o. female, has a history of pain and functional disability in the right knee due to arthritis and has failed non-surgical conservative treatments for greater than 12 weeks to include NSAID's and/or analgesics, corticosteriod injections, use of assistive devices and activity modification.  Onset of symptoms was gradual, starting  years ago with gradually worsening course since that time. The patient noted no past surgery on the right knee(s).  Patient currently rates pain in the right knee(s) at 8 out of 10 with activity. Patient has night pain, worsening of pain with activity and weight bearing, pain that interferes with activities of  daily living, pain with passive range of motion, crepitus and joint swelling.  Patient has evidence of periarticular osteophytes and joint space narrowing by imaging studies.  There is no active infection.  Risks, benefits and expectations were discussed with the patient.  Risks including but not limited to the risk of anesthesia, blood clots, nerve damage, blood vessel damage, failure of the prosthesis, infection and up to and including death.  Patient understand the risks, benefits and expectations and wishes to proceed with surgery.   PCP: Robyne Peers, MD   Discharged Condition: good  Hospital Course:  Patient underwent the above stated procedure on 05/02/2019. Patient tolerated the procedure well and brought to the recovery room in good condition and subsequently to the floor.  POD #1 BP: 116/65 ; Pulse: 64 ; Temp: 98.2 F (36.8 C) ; Resp: 14 Patient reports pain as moderate, pain not fully controlled on oxycodone.  She is previously used Dilaudid after a back surgery and did well on that medication.  No reported events throughout the night.  Patient is ready be discharged home. Dorsiflexion/plantar flexion intact, incision: dressing C/D/I, no cellulitis present and compartment soft.   LABS  Basename    HGB     10.3  HCT     34.4    Discharge Exam: General appearance: alert, cooperative and no distress Extremities: Homans sign is negative, no sign  of DVT, no edema, redness or tenderness in the calves or thighs and no ulcers, gangrene or trophic changes  Disposition: Home with follow up in 2 weeks   Follow-up Information    Paralee Cancel, MD. Schedule an appointment as soon as possible for a visit in 2 weeks.   Specialty: Orthopedic Surgery Contact information: [address] [place] [address] [id number]           Discharge Instructions    Call MD / Call 911   Complete by: As directed    If you experience chest pain or shortness of breath, CALL  911 and be transported to the hospital emergency room.  If you develope a fever above 101 F, pus (white drainage) or increased drainage or redness at the wound, or calf pain, call your surgeon's office.   Change dressing   Complete by: As directed    Maintain surgical dressing until follow up in the clinic. If the edges start to pull up, may reinforce with tape. If the dressing is no longer working, may remove and cover with gauze and tape, but must keep the area dry and clean.  Call with any questions or concerns.   Constipation Prevention   Complete by: As directed    Drink plenty of fluids.  Prune juice may be helpful.  You may use a stool softener, such as Colace (over the counter) 100 mg twice a day.  Use MiraLax (over the counter) for constipation as needed.   Diet - low sodium heart healthy   Complete by: As directed    Discharge instructions   Complete by: As directed    Maintain surgical dressing until follow up in the clinic. If the edges start to pull up, may reinforce with tape. If the dressing is no longer working, may remove and cover with gauze and tape, but must keep the area dry and clean.  Follow up in 2 weeks at [facility] Call with any questions or concerns.   Increase activity slowly as tolerated   Complete by: As directed    Weight bearing as tolerated with assist device (walker, cane, etc) as directed, use it as long as suggested by your surgeon or therapist, typically at least 4-6 weeks.   TED hose   Complete by: As directed    Use stockings (TED hose) for 2 weeks on both leg(s).  You may remove them at night for sleeping.      Allergies as of 05/03/2019      Reactions   Shellfish Allergy Anaphylaxis   Lip swelling   Codeine Other (See Comments)   nightmares   Hydrocod Polst-cpm Polst Er Itching   Hydrocodone-acetaminophen Itching, Other (See Comments)   Has to take benadryl with to tolerate   Sulfa Antibiotics Rash   Sulfonamide Derivatives Rash       Medication List    STOP taking these medications   aspirin 81 MG tablet Replaced by: aspirin 81 MG chewable tablet   cephALEXin 500 MG capsule Commonly known as: KEFLEX     TAKE these medications   acetaminophen 500 MG tablet Commonly known as: TYLENOL Take 2 tablets (1,000 mg total) by mouth every 8 (eight) hours.   aspirin 81 MG chewable tablet Commonly known as: Aspirin Childrens Chew 1 tablet (81 mg total) by mouth 2 (two) times daily. Take for 4 weeks, then resume regular dose. Replaces: aspirin 81 MG tablet   budesonide 3 MG 24 hr capsule Commonly known as:  ENTOCORT EC Take 3 capsules (9 mg total) by mouth daily. Take 3,  3mg  tabs in morning as needed What changed:   how much to take  additional instructions   budesonide-formoterol 160-4.5 MCG/ACT inhaler Commonly known as: Symbicort Inhale 2 puffs into the lungs 2 (two) times daily.   Calcium Carbonate-Vitamin D 600-400 MG-UNIT tablet Take 1 tablet by mouth daily.   cetirizine 10 MG tablet Commonly known as: ZYRTEC Take 10 mg by mouth daily.   [employer] Hydrocortisone Acetate 0.5 % cream Generic drug: hydrocortisone acetate APPLY TO AFFECTED AREA TOPICALLY AS NEEDED FOR HEMORRHOIDS What changed: See the new instructions.   diphenoxylate-atropine 2.5-0.025 MG tablet Commonly known as: LOMOTIL Take 1 tab by mouth as needed. What changed:   how much to take  how to take this  when to take this  reasons to take this  additional instructions   docusate sodium 100 MG capsule Commonly known as: Colace Take 1 capsule (100 mg total) by mouth 2 (two) times daily.   famotidine 20 MG tablet Commonly known as: PEPCID TAKE 1 TABLET BY MOUTH EVERYDAY AT BEDTIME What changed: See the new instructions.   FISH OIL PO Take 1,400 mg by mouth at bedtime.   Flutter Devi 1 Device by Does not apply route as directed.   hydrochlorothiazide 25 MG tablet Commonly known as: HYDRODIURIL Take 25 mg by mouth  daily.   HYDROmorphone 2 MG tablet Commonly known as: Dilaudid Take 1-2 tablets (2-4 mg total) by mouth every 4 (four) hours as needed for severe pain.   Iron (Ferrous Sulfate) 325 (65 Fe) MG Tabs Take 1 tablet by mouth 2 (two) times daily.   levothyroxine 125 MCG tablet Commonly known as: SYNTHROID Take 125 mcg by mouth daily.   Lubricant Eye Drops 0.4-0.3 % Soln Generic drug: Polyethyl Glycol-Propyl Glycol Place 1 drop into both eyes 3 (three) times daily as needed (dry/irritated eyes.).   Melatonin 5 MG Tabs Take 7.5 mg by mouth at bedtime.   methocarbamol 500 MG tablet Commonly known as: Robaxin Take 1 tablet (500 mg total) by mouth every 6 (six) hours as needed for muscle spasms.   pantoprazole 40 MG tablet Commonly known as: PROTONIX TAKE 1 TABLET BY MOUTH EVERY DAY   polyethylene glycol 17 g packet Commonly known as: MIRALAX / GLYCOLAX Take 17 g by mouth 2 (two) times daily.   pravastatin 20 MG tablet Commonly known as: PRAVACHOL Take 20 mg by mouth daily.   sertraline 100 MG tablet Commonly known as: ZOLOFT Take 150 mg by mouth daily.   Spacer/Aero-Holding [employer] 1 Device by Does not apply route as directed.   vitamin C 1000 MG tablet Take 1,000 mg by mouth daily.            Discharge Care Instructions  (From admission, onward)         Start     Ordered   05/03/19 0000  Change dressing    Comments: Maintain surgical dressing until follow up in the clinic. If the edges start to pull up, may reinforce with tape. If the dressing is no longer working, may remove and cover with gauze and tape, but must keep the area dry and clean.  Call with any questions or concerns.   05/03/19 0800           Signed: West Pugh. Trentyn Boisclair   PA-C  05/09/2019, 3:04 PM

## 2019-05-09 NOTE — ED Notes (Signed)
Pt transported to XR.  

## 2019-05-09 NOTE — ED Triage Notes (Signed)
Per EMS: Pt coming from home c/o fever that started today . Pt recently had knee surgery last week. Pt took tylenol and dilaudid around 7pm. Pt has increased urination but no burning or odor.   [id number] Initial temp- 101.3 Temp now- 98.0 160/80 110 hr Cap-35 rr-26  Lungs clear    20 R hand

## 2019-05-09 NOTE — ED Notes (Signed)
Urine and culture sent to lab  

## 2019-05-09 NOTE — ED Provider Notes (Signed)
[place] DEPT Provider Note   CSN: [id number] Arrival date & time: 05/09/19  2149     History   Chief Complaint Chief Complaint  Patient presents with  . Fever    HPI [name] is a 73 y.o. female.     Patient presents to the emergency department with a chief complaint of subjective fevers, chills, and fatigue.  She states she noticed the chills tonight around 7 PM.  T-max was 101.3.  She is status post 7 days right TKA.  She reports slight cough and some changes to her breathing.  She denies any chest pain or shortness of breath.  He states that her knee is nonpainful as long as she is not moving it.  She also reports having been incontinent of urine since the surgery.  She has also been constipated. She denies any other associated symptoms.  The history is provided by the patient. No language interpreter was used.    Past Medical History:  Diagnosis Date  . Anxiety state, unspecified   . Benign neoplasm of colon   . Brain tumor ([place])   . Cancer ([place])   . Colon polyps    hyperplastic  . Complication of anesthesia    requests dilaudid for pain  . Complications affecting other specified body systems, hypertension    No cardiologist   . Contact lens/glasses fitting    wears glassses or contacts  . Depressive disorder, not elsewhere classified   . Dyspnea    due to fibrosis  . Esophageal reflux   . Esophageal ring   . Family history of colon cancer mother  . Fundic gland polyps of stomach, benign   . Hemorrhoids   . Hiatal hernia   . History of kidney stones   . Hypertension   . Hypothyroidism   . Lymphocytic colitis   . Obesity   . Other and unspecified hyperlipidemia   . Pneumonia 02/28/2019  . Pulmonary fibrosis ([place])   . Seizures ([place])    x 1    Patient Active Problem List   Diagnosis Date Noted  . Obese 05/03/2019  . S/P right TKA 05/02/2019  . Asthma 02/17/2019  . Flu-like symptoms 09/26/2018  . ILD  (interstitial lung disease) ([employer]) 09/26/2018  . Tinnitus, bilateral 10/20/2016  . Excessive cerumen in both ear canals 10/20/2016  . Benign essential tremor 06/09/2016  . Benign paroxysmal positional vertigo 06/09/2016  . History of benign brain tumor 06/09/2016  . Gait disturbance 06/09/2016  . Trimalleolar fracture of ankle, closed 04/03/2015  . Lumbar radiculopathy 04/19/2013  . DIARRHEA 09/15/2010  . PERSONAL HISTORY OF FAILED MODERATE SEDATION 09/15/2010  . DIARRHEA-PRESUMED INFECTIOUS 07/24/2010  . HEMORRHOIDS-INTERNAL 12/10/2008  . HEMORRHOIDS-EXTERNAL 12/10/2008  . PERSONAL HX COLONIC POLYPS 12/10/2008  . COLONIC POLYPS, HYPERPLASTIC 11/22/2008  . HYPERLIPIDEMIA 11/22/2008  . ANXIETY 11/22/2008  . DEPRESSION 11/22/2008  . ABNORMAL HEART RHYTHMS 11/22/2008  . HEMORRHOIDS 11/22/2008  . GERD 11/22/2008  . Diaphragmatic hernia 11/22/2008  . HYPERTENSION NEC 11/22/2008    Past Surgical History:  Procedure Laterality Date  . ABDOMINAL HYSTERECTOMY    . APPENDECTOMY    . BACK SURGERY  2014   rod in lower back  . BRAIN SURGERY  1999   benign tumor  . CARPAL TUNNEL RELEASE     right  . CATARACT EXTRACTION, BILATERAL  04/2018  . CRANIOTOMY    . EYE SURGERY    . HAMMERTOE RECONSTRUCTION WITH WEIL OSTEOTOMY  07/07/2012   Procedure:  HAMMERTOE RECONSTRUCTION WITH WEIL OSTEOTOMY;  Surgeon: Wylene Simmer, MD;  Location: [facility];  Service: Orthopedics;  Laterality: Left;  Left Second through [place]; Left Fifth Toe Flexor Tendon Release, and Left Second through [place] Osteotomy  . ORIF ANKLE FRACTURE Left 04/04/2015   Procedure: OPEN REDUCTION INTERNAL FIXATION (ORIF) LEFT ANKLE FRACTURE;  Surgeon: Wylene Simmer, MD;  Location: [facility];  Service: Orthopedics;  Laterality: Left;  . TOTAL KNEE ARTHROPLASTY Right 05/02/2019   Procedure: TOTAL KNEE ARTHROPLASTY;  Surgeon: Paralee Cancel, MD;  Location: [facility];  Service: Orthopedics;  Laterality: Right;   70 mins     OB History   No obstetric history on file.      Home Medications    Prior to Admission medications   Medication Sig Start Date End Date Taking? Authorizing Provider  acetaminophen (TYLENOL) 500 MG tablet Take 2 tablets (1,000 mg total) by mouth every 8 (eight) hours. 05/03/19   Danae Orleans, PA-C  Ascorbic Acid (VITAMIN C) 1000 MG tablet Take 1,000 mg by mouth daily.      [provider]  aspirin (ASPIRIN CHILDRENS) 81 MG chewable tablet Chew 1 tablet (81 mg total) by mouth 2 (two) times daily. Take for 4 weeks, then resume regular dose. 05/04/19 06/03/19  Danae Orleans, PA-C  budesonide (ENTOCORT EC) 3 MG 24 hr capsule Take 3 capsules (9 mg total) by mouth daily. Take 3,  3mg  tabs in morning as needed Patient taking differently: Take 6 mg by mouth daily.  03/27/19   Irene Shipper, MD  budesonide-formoterol [facility]) 160-4.5 MCG/ACT inhaler Inhale 2 puffs into the lungs 2 (two) times daily. 02/17/19   Martyn Ehrich, NP  Calcium Carbonate-Vitamin D 600-400 MG-UNIT tablet Take 1 tablet by mouth daily.    [provider]  cetirizine (ZYRTEC) 10 MG tablet Take 10 mg by mouth daily.    [provider]  [facility] HYDROCORTISONE ACETATE 0.5 % cream APPLY TO AFFECTED AREA TOPICALLY AS NEEDED FOR HEMORRHOIDS Patient taking differently: Apply 1 application topically daily as needed (for hemorrhoids).  02/10/16   Irene Shipper, MD  diphenoxylate-atropine (LOMOTIL) 2.5-0.025 MG tablet Take 1 tab by mouth as needed. Patient taking differently: Take 1-2 tablets by mouth 4 (four) times daily as needed for diarrhea or loose stools.  03/27/19   Irene Shipper, MD  docusate sodium (COLACE) 100 MG capsule Take 1 capsule (100 mg total) by mouth 2 (two) times daily. 05/03/19   Danae Orleans, PA-C  famotidine (PEPCID) 20 MG tablet TAKE 1 TABLET BY MOUTH EVERYDAY AT BEDTIME Patient taking differently: Take 20 mg by mouth at bedtime.  02/02/19   Mauri Pole, MD   hydrochlorothiazide (HYDRODIURIL) 25 MG tablet Take 25 mg by mouth daily. 10/03/18   [provider]  HYDROmorphone (DILAUDID) 2 MG tablet Take 1-2 tablets (2-4 mg total) by mouth every 4 (four) hours as needed for severe pain. 05/03/19   Danae Orleans, PA-C  Iron, Ferrous Sulfate, 325 (65 Fe) MG TABS Take 1 tablet by mouth 2 (two) times daily. 03/27/19   Irene Shipper, MD  levothyroxine (SYNTHROID) 125 MCG tablet Take 125 mcg by mouth daily. 12/08/18   [provider]  Melatonin 5 MG TABS Take 7.5 mg by mouth at bedtime.    [provider]  methocarbamol (ROBAXIN) 500 MG tablet Take 1 tablet (500 mg total) by mouth every 6 (six) hours as needed for muscle spasms. 05/03/19   Danae Orleans, PA-C  Omega-3 Fatty Acids (FISH OIL PO) Take 1,400 mg by mouth at bedtime.     [provider]  pantoprazole (PROTONIX) 40 MG tablet TAKE 1 TABLET BY MOUTH EVERY DAY Patient taking differently: Take 40 mg by mouth daily.  02/01/19   Mauri Pole, MD  Polyethyl Glycol-Propyl Glycol (LUBRICANT EYE DROPS) 0.4-0.3 % SOLN Place 1 drop into both eyes 3 (three) times daily as needed (dry/irritated eyes.).    [provider]  polyethylene glycol (MIRALAX / GLYCOLAX) 17 g packet Take 17 g by mouth 2 (two) times daily. 05/03/19   Danae Orleans, PA-C  pravastatin (PRAVACHOL) 20 MG tablet Take 20 mg by mouth daily. 03/25/19   [provider]  Respiratory Therapy Supplies (FLUTTER) DEVI 1 Device by Does not apply route as directed. 09/26/18   Martyn Ehrich, NP  sertraline (ZOLOFT) 100 MG tablet Take 150 mg by mouth daily. 11/29/18   [provider]  Spacer/Aero-Holding Josiah Lobo DEVI 1 Device by Does not apply route as directed. 10/14/18   Brand Males, MD    Family History Family History  Problem Relation Age of Onset  . Breast cancer Mother   . Colon cancer Mother   . Lung cancer Maternal Uncle   . Bone cancer Maternal Grandmother   . Heart attack  Father   . Stomach cancer Neg Hx   . Pancreatic cancer Neg Hx   . Esophageal cancer Neg Hx     Social History Social History   Tobacco Use  . Smoking status: Former Smoker    Packs/day: 0.50    Years: 20.00    Pack years: 10.00    Quit date: 09/02/2010    Years since quitting: 8.6  . Smokeless tobacco: Never Used  Substance Use Topics  . Alcohol use: Yes    Alcohol/week: 7.0 standard drinks    Types: 7 Standard drinks or equivalent per week    Comment: intermittently 1 martini daily on most days  . Drug use: No     Allergies   Shellfish allergy, Codeine, Hydrocod polst-cpm polst er, Hydrocodone-acetaminophen, Sulfa antibiotics, and Sulfonamide derivatives   Review of Systems Review of Systems  All other systems reviewed and are negative.    Physical Exam Updated Vital Signs BP (!) 151/68 (BP Location: Left Arm)   Pulse 86   Temp 99.3 F (37.4 C) (Oral)   Resp (!) 21   Ht 5\' 4"  (1.626 m)   Wt 90.7 kg   SpO2 96%   BMI 34.33 kg/m   Physical Exam Vitals signs and nursing note reviewed.  Constitutional:      General: She is not in acute distress.    Appearance: She is well-developed.  HENT:     Head: Normocephalic and atraumatic.  Eyes:     Conjunctiva/sclera: Conjunctivae normal.  Neck:     Musculoskeletal: Neck supple.  Cardiovascular:     Rate and Rhythm: Normal rate and regular rhythm.     Heart sounds: No murmur.     Comments: Intact distal pulses bilaterally Pulmonary:     Effort: Pulmonary effort is normal. No respiratory distress.     Breath sounds: Normal breath sounds.  Abdominal:     Palpations: Abdomen is soft.     Tenderness: There is no abdominal tenderness.  Musculoskeletal:     Comments: Significant swelling about the right lower extremity, there is mild erythema surrounding the surgical incision, there is increased warmth around the right knee, no purulence or sign of abscess,  there is ecchymosis in the distal right lower extremity   Skin:    General: Skin is warm and dry.  Neurological:     Mental Status: She is alert and oriented to person, place, and time.  Psychiatric:        Mood and Affect: Mood normal.        Behavior: Behavior normal.      ED Treatments / Results  Labs (all labs ordered are listed, but only abnormal results are displayed) Labs Reviewed  COMPREHENSIVE METABOLIC PANEL - Abnormal; Notable for the following components:      Result Value   Potassium 3.2 (*)    Glucose, Bld 169 (*)    Total Protein 6.1 (*)    Albumin 3.4 (*)    All other components within normal limits  CBC WITH DIFFERENTIAL/PLATELET - Abnormal; Notable for the following components:   WBC 13.9 (*)    RBC 3.13 (*)    Hemoglobin 8.8 (*)    HCT 28.7 (*)    RDW 22.6 (*)    Neutro Abs 11.3 (*)    Monocytes Absolute 1.1 (*)    Abs Immature Granulocytes 0.08 (*)    All other components within normal limits  URINALYSIS, ROUTINE W REFLEX MICROSCOPIC - Abnormal; Notable for the following components:   Ketones, ur 5 (*)    All other components within normal limits  CULTURE, BLOOD (ROUTINE X 2)  CULTURE, BLOOD (ROUTINE X 2)  URINE CULTURE  SARS CORONAVIRUS 2 BY RT PCR (HOSPITAL ORDER, [facility])  LACTIC ACID, PLASMA  PROTIME-INR  APTT    EKG None  Radiology Dg Chest 2 View  Result Date: 05/09/2019 CLINICAL DATA:  73 year old female with sepsis. Fever. EXAM: CHEST - 2 VIEW COMPARISON:  Chest radiographs 02/28/2019 and earlier. Chest CT 04/05/2019. FINDINGS: Portable AP semi upright view at 2242 hours. Moderate chronic hiatal hernia. Other mediastinal contours are within normal limits. Visualized tracheal air column is within normal limits. Stable lung volumes. Chronic increased interstitial markings with pulmonary fibrosis suspected on CT last month. New 5 centimeter area of confluent opacity in the left upper lobe partially abutting the hilum. No superimposed pneumothorax or pleural effusion.  No other confluent opacity. Paucity of bowel gas in the upper abdomen. IMPRESSION: 1. New 5 cm area of opacity in the left upper lung since the CT last month is compatible with upper lobe pneumonia. No pleural effusion. 2. Underlying pulmonary fibrosis. 3. Moderate chronic hiatal hernia. Electronically Signed   By: Genevie Ann M.D.   On: 05/09/2019 23:02   Dg Knee Complete 4 Views Right  Result Date: 05/09/2019 CLINICAL DATA:  Swelling fever EXAM: RIGHT KNEE - COMPLETE 4+ VIEW COMPARISON:  None. FINDINGS: Status post knee replacement. Normal alignment and no fracture. Generalized soft tissue swelling. Possible knee effusion. IMPRESSION: 1. Status post right knee replacement without acute osseous abnormality 2. Diffuse soft tissue swelling with possible knee effusion Electronically Signed   By: Donavan Foil M.D.   On: 05/09/2019 23:01    Procedures Procedures (including critical care time) CRITICAL CARE Performed by: Montine Circle  Sepsis with source in lungs, WBC 13.9, temp PTA 101.3, tachy to 110s Total critical care time: 45 minutes  Critical care time was exclusive of separately billable procedures and treating other patients.  Critical care was necessary to treat or prevent imminent or life-threatening deterioration.  Critical care was time spent personally by me on the following activities: development of treatment plan with  patient and/or surrogate as well as nursing, discussions with consultants, evaluation of patient's response to treatment, examination of patient, obtaining history from patient or surrogate, ordering and performing treatments and interventions, ordering and review of laboratory studies, ordering and review of radiographic studies, pulse oximetry and re-evaluation of patient's condition.  Medications Ordered in ED Medications  ceFEPIme (MAXIPIME) 2 g in sodium chloride 0.9 % 100 mL IVPB (has no administration in time range)  metroNIDAZOLE (FLAGYL) IVPB 500 mg (has no  administration in time range)  vancomycin (VANCOCIN) IVPB 1000 mg/200 mL premix (has no administration in time range)     Initial Impression / Assessment and Plan / ED Course  I have reviewed the triage vital signs and the nursing notes.  Pertinent labs & imaging results that were available during my care of the patient were reviewed by me and considered in my medical decision making (see chart for details).        Patient with fever and chills at home.  7 days status post right TKA.  Right lower extremity is swollen, there is erythema around the surgical incision.  Febrile at home to 101.3.  Tachycardic to the 110s by EMS, with respiration rate of 26.  Meets sirs criteria.  Code sepsis activated.  Will start empiric broad spectrum abx.  Chest x-ray shows left upper lobe pneumonia.    Patient seen by and discussed with Dr. Roderic Palau.  States pneumonia the likely source.  Less likely to be knee.    Appreciate Dr. Hal Hope for admitting the patient.  Final Clinical Impressions(s) / ED Diagnoses   Final diagnoses:  HCAP (healthcare-associated pneumonia)    ED Discharge Orders    None       Montine Circle, PA-C 05/09/19 2328    Milton Ferguson, MD 05/13/19 1014

## 2019-05-09 NOTE — Telephone Encounter (Signed)
lmtcb for pt - needs OV with MR in early November.

## 2019-05-10 ENCOUNTER — Encounter (HOSPITAL_COMMUNITY): Payer: Self-pay | Admitting: Internal Medicine

## 2019-05-10 ENCOUNTER — Inpatient Hospital Stay (HOSPITAL_COMMUNITY): Payer: Medicare Other

## 2019-05-10 ENCOUNTER — Other Ambulatory Visit: Payer: Self-pay

## 2019-05-10 DIAGNOSIS — G250 Essential tremor: Secondary | ICD-10-CM

## 2019-05-10 DIAGNOSIS — J189 Pneumonia, unspecified organism: Secondary | ICD-10-CM | POA: Diagnosis present

## 2019-05-10 LAB — MAGNESIUM: Magnesium: 2.2 mg/dL (ref 1.7–2.4)

## 2019-05-10 LAB — BASIC METABOLIC PANEL
Anion gap: 8 (ref 5–15)
BUN: 14 mg/dL (ref 8–23)
CO2: 25 mmol/L (ref 22–32)
Calcium: 9.4 mg/dL (ref 8.9–10.3)
Chloride: 104 mmol/L (ref 98–111)
Creatinine, Ser: 0.58 mg/dL (ref 0.44–1.00)
GFR calc Af Amer: >60 mL/min (ref >60)
GFR calc non Af Amer: >60 mL/min (ref >60)
Glucose, Bld: 151 mg/dL — ABNORMAL HIGH (ref 70–99)
Potassium: 3.4 mmol/L — ABNORMAL LOW (ref 3.5–5.1)
Sodium: 137 mmol/L (ref 135–145)

## 2019-05-10 LAB — CBC
HCT: 27.4 % — ABNORMAL LOW (ref 36.0–46.0)
Hemoglobin: 8.5 g/dL — ABNORMAL LOW (ref 12.0–15.0)
MCH: 28.3 pg (ref 26.0–34.0)
MCHC: 31 g/dL (ref 30.0–36.0)
MCV: 91.3 fL (ref 80.0–100.0)
Platelets: 237 10*3/uL (ref 150–400)
RBC: 3 MIL/uL — ABNORMAL LOW (ref 3.87–5.11)
RDW: 22.8 % — ABNORMAL HIGH (ref 11.5–15.5)
WBC: 12.4 10*3/uL — ABNORMAL HIGH (ref 4.0–10.5)
nRBC: 0 % (ref 0.0–0.2)

## 2019-05-10 LAB — INFLUENZA PANEL BY PCR (TYPE A & B)
Influenza A By PCR: NEGATIVE
Influenza B By PCR: NEGATIVE

## 2019-05-10 LAB — STREP PNEUMONIAE URINARY ANTIGEN: Strep Pneumo Urinary Antigen: NEGATIVE

## 2019-05-10 MED ORDER — AZITHROMYCIN 250 MG PO TABS
500.0000 mg | ORAL_TABLET | Freq: Every day | ORAL | Status: DC
  Administered 2019-05-11: 500 mg via ORAL
  Filled 2019-05-10: qty 2

## 2019-05-10 MED ORDER — LEVOTHYROXINE SODIUM 25 MCG PO TABS
125.0000 ug | ORAL_TABLET | Freq: Every day | ORAL | Status: DC
  Administered 2019-05-10 – 2019-05-13 (×4): 125 ug via ORAL
  Filled 2019-05-10 (×4): qty 1

## 2019-05-10 MED ORDER — POTASSIUM CHLORIDE 20 MEQ PO PACK
40.0000 meq | PACK | Freq: Once | ORAL | Status: AC
  Administered 2019-05-10: 40 meq via ORAL
  Filled 2019-05-10: qty 2

## 2019-05-10 MED ORDER — ONDANSETRON HCL 4 MG PO TABS: 4.0000 mg | ORAL_TABLET | Freq: Four times a day (QID) | ORAL | Status: DC | PRN

## 2019-05-10 MED ORDER — ONDANSETRON HCL 4 MG/2ML IJ SOLN: 4.0000 mg | Freq: Four times a day (QID) | INTRAMUSCULAR | Status: DC | PRN

## 2019-05-10 MED ORDER — SODIUM CHLORIDE 0.9 % IV SOLN
500.0000 mg | INTRAVENOUS | Status: DC
  Administered 2019-05-10: 500 mg via INTRAVENOUS
  Filled 2019-05-10: qty 500

## 2019-05-10 MED ORDER — SERTRALINE HCL 50 MG PO TABS
150.0000 mg | ORAL_TABLET | Freq: Every day | ORAL | Status: DC
  Administered 2019-05-10 – 2019-05-13 (×4): 150 mg via ORAL
  Filled 2019-05-10 (×5): qty 1

## 2019-05-10 MED ORDER — MORPHINE SULFATE (PF) 2 MG/ML IV SOLN
1.0000 mg | INTRAVENOUS | Status: DC | PRN
  Administered 2019-05-10 (×2): 1 mg via INTRAVENOUS
  Filled 2019-05-10 (×2): qty 1

## 2019-05-10 MED ORDER — MOMETASONE FURO-FORMOTEROL FUM 200-5 MCG/ACT IN AERO
2.0000 | INHALATION_SPRAY | Freq: Two times a day (BID) | RESPIRATORY_TRACT | Status: DC
  Administered 2019-05-10 – 2019-05-13 (×6): 2 via RESPIRATORY_TRACT
  Filled 2019-05-10: qty 8.8

## 2019-05-10 MED ORDER — MELATONIN 5 MG PO TABS
7.5000 mg | ORAL_TABLET | Freq: Every day | ORAL | Status: DC
  Administered 2019-05-10 – 2019-05-12 (×4): 7.5 mg via ORAL
  Filled 2019-05-10 (×5): qty 1.5

## 2019-05-10 MED ORDER — ACETAMINOPHEN 650 MG RE SUPP: 650.0000 mg | Freq: Four times a day (QID) | RECTAL | Status: DC | PRN

## 2019-05-10 MED ORDER — ACETAMINOPHEN 325 MG PO TABS
650.0000 mg | ORAL_TABLET | Freq: Four times a day (QID) | ORAL | Status: DC | PRN
  Administered 2019-05-10 – 2019-05-12 (×6): 650 mg via ORAL
  Filled 2019-05-10 (×6): qty 2

## 2019-05-10 MED ORDER — FAMOTIDINE 20 MG PO TABS
20.0000 mg | ORAL_TABLET | Freq: Every day | ORAL | Status: DC
  Administered 2019-05-10 – 2019-05-12 (×4): 20 mg via ORAL
  Filled 2019-05-10 (×4): qty 1

## 2019-05-10 MED ORDER — BUDESONIDE 3 MG PO CPEP
6.0000 mg | ORAL_CAPSULE | Freq: Every day | ORAL | Status: DC
  Administered 2019-05-10 – 2019-05-13 (×4): 6 mg via ORAL
  Filled 2019-05-10 (×4): qty 2

## 2019-05-10 MED ORDER — FERROUS SULFATE 325 (65 FE) MG PO TABS
325.0000 mg | ORAL_TABLET | Freq: Two times a day (BID) | ORAL | Status: DC
  Administered 2019-05-10 – 2019-05-13 (×7): 325 mg via ORAL
  Filled 2019-05-10 (×7): qty 1

## 2019-05-10 MED ORDER — SODIUM CHLORIDE 0.9 % IV SOLN
2.0000 g | INTRAVENOUS | Status: DC
  Administered 2019-05-10 – 2019-05-11 (×2): 2 g via INTRAVENOUS
  Filled 2019-05-10 (×2): qty 2
  Filled 2019-05-10: qty 20

## 2019-05-10 MED ORDER — HYDROCHLOROTHIAZIDE 25 MG PO TABS
25.0000 mg | ORAL_TABLET | Freq: Every day | ORAL | Status: DC
  Administered 2019-05-10 – 2019-05-13 (×4): 25 mg via ORAL
  Filled 2019-05-10 (×4): qty 1

## 2019-05-10 MED ORDER — SODIUM CHLORIDE (PF) 0.9 % IJ SOLN
INTRAMUSCULAR | Status: AC
  Filled 2019-05-10: qty 50

## 2019-05-10 MED ORDER — IOHEXOL 350 MG/ML SOLN
100.0000 mL | Freq: Once | INTRAVENOUS | Status: AC | PRN
  Administered 2019-05-10: 100 mL via INTRAVENOUS

## 2019-05-10 MED ORDER — PANTOPRAZOLE SODIUM 40 MG PO TBEC
40.0000 mg | DELAYED_RELEASE_TABLET | Freq: Every day | ORAL | Status: DC
  Administered 2019-05-10 – 2019-05-13 (×4): 40 mg via ORAL
  Filled 2019-05-10 (×4): qty 1

## 2019-05-10 MED ORDER — ENOXAPARIN SODIUM 40 MG/0.4ML ~~LOC~~ SOLN
40.0000 mg | SUBCUTANEOUS | Status: DC
  Administered 2019-05-10 – 2019-05-13 (×4): 40 mg via SUBCUTANEOUS
  Filled 2019-05-10 (×5): qty 0.4

## 2019-05-10 MED ORDER — SODIUM CHLORIDE 0.9 % IV SOLN
INTRAVENOUS | Status: DC | PRN
  Administered 2019-05-10: 250 mL via INTRAVENOUS

## 2019-05-10 MED ORDER — PRAVASTATIN SODIUM 20 MG PO TABS
20.0000 mg | ORAL_TABLET | Freq: Every day | ORAL | Status: DC
  Administered 2019-05-10 – 2019-05-13 (×4): 20 mg via ORAL
  Filled 2019-05-10 (×4): qty 1

## 2019-05-10 NOTE — TOC Initial Note (Signed)
Transition of Care [facility]) - Initial/Assessment Note    Patient Details  Name: [name] MRN: [id number] Date of Birth: July 10, 1946  [facility]) CM/SW Contact:    Purcell Mouton, RN Phone Number: 05/10/2019, 3:25 PM  Clinical Narrative:  Pt admitted with Shortness of breath and fever chills. TOC will continue to follow for discharge needs.  Expected Discharge Plan: Home/Self Care     Patient Goals and CMS Choice Patient states their goals for this hospitalization and ongoing recovery are:: To feel better, go home      Expected Discharge Plan and Services Expected Discharge Plan: Home/Self Care   Discharge Planning Services: CM Consult   Living arrangements for the past 2 months: Single Family Home                                      Prior Living Arrangements/Services Living arrangements for the past 2 months: Single Family Home Lives with:: Spouse Patient language and need for interpreter reviewed:: No Do you feel safe going back to the place where you live?: Yes               Activities of Daily Living Home Assistive Devices/Equipment: Walker (specify type)(front wheel rolling walker) ADL Screening (condition at time of admission) Patient's cognitive ability adequate to safely complete daily activities?: Yes Is the patient deaf or have difficulty hearing?: No Does the patient have difficulty seeing, even when wearing glasses/contacts?: No Does the patient have difficulty concentrating, remembering, or making decisions?: No Patient able to express need for assistance with ADLs?: Yes Does the patient have difficulty dressing or bathing?: No Independently performs ADLs?: Yes (appropriate for developmental age) Does the patient have difficulty walking or climbing stairs?: Yes Weakness of Legs: Right Weakness of Arms/Hands: None  Permission Sought/Granted                  Emotional Assessment Appearance:: Appears stated age     Orientation: : Oriented to Self, Oriented to Place, Oriented to  Time, Oriented to Situation      Admission diagnosis:  HCAP (healthcare-associated pneumonia) [J18.9] CAP (community acquired pneumonia) [J18.9] Patient Active Problem List   Diagnosis Date Noted  . CAP (community acquired pneumonia) 05/10/2019  . Pneumonia 05/09/2019  . Obese 05/03/2019  . S/P right TKA 05/02/2019  . Asthma 02/17/2019  . Flu-like symptoms 09/26/2018  . ILD (interstitial lung disease) ([place]) 09/26/2018  . Tinnitus, bilateral 10/20/2016  . Excessive cerumen in both ear canals 10/20/2016  . Benign essential tremor 06/09/2016  . Benign paroxysmal positional vertigo 06/09/2016  . History of benign brain tumor 06/09/2016  . Gait disturbance 06/09/2016  . Trimalleolar fracture of ankle, closed 04/03/2015  . Lumbar radiculopathy 04/19/2013  . DIARRHEA 09/15/2010  . PERSONAL HISTORY OF FAILED MODERATE SEDATION 09/15/2010  . DIARRHEA-PRESUMED INFECTIOUS 07/24/2010  . HEMORRHOIDS-INTERNAL 12/10/2008  . HEMORRHOIDS-EXTERNAL 12/10/2008  . PERSONAL HX COLONIC POLYPS 12/10/2008  . COLONIC POLYPS, HYPERPLASTIC 11/22/2008  . HYPERLIPIDEMIA 11/22/2008  . ANXIETY 11/22/2008  . DEPRESSION 11/22/2008  . ABNORMAL HEART RHYTHMS 11/22/2008  . HEMORRHOIDS 11/22/2008  . GERD 11/22/2008  . Diaphragmatic hernia 11/22/2008  . HYPERTENSION NEC 11/22/2008   PCP:  Robyne Peers, MD Pharmacy:   [id number] [place], [address] [place] [address] Phone: [phone] Fax: [phone]     Social Determinants of Health (SDOH) Interventions  Readmission Risk Interventions No flowsheet data found.

## 2019-05-10 NOTE — Progress Notes (Signed)
Pharmacy IV to PO conversion  This patient is receiving Azithromycin by the intravenous route. Based on criteria approved by the Pharmacy and Therapeutics Committee, and the Infectious Disease Division, the antibiotic(s) is/are being converted to equivalent oral dose form(s). These criteria include:   Patient being treated for a respiratory tract infection, urinary tract infection, cellulitis, or Clostridium Difficile Associated Diarrhea  The patient is not neutropenic and does not exhibit a GI malabsorption state  The patient is eating (either orally or per tube) and/or has been taking other orally administered medications for at least 24 hours.  The patient is improving clinically (physician assessment and a 24-hour Tmax of <=100.5 F)  If you have any questions about this conversion, please contact the Pharmacy Department (ext [phone]).  Thank you.  Reuel Boom, PharmD, BCPS [phone] 05/10/2019, 2:30 PM

## 2019-05-10 NOTE — ED Notes (Signed)
Pt back from radiology. Pt being transported upstairs now.  All belongings taken with pt

## 2019-05-10 NOTE — H&P (Signed)
History and Physical    [name] [id number] DOB: [birth year] DOA: 05/09/2019  PCP: Robyne Peers, MD  Patient coming from: Home.  Chief Complaint: Shortness of breath and fever chills.  HPI: [name] is a 73 y.o. female with history of recent right total knee replacement a week ago with history of interstitial lung disease, hypertension, hyperlipidemia, hypothyroidism, benign essential tremor,.  History of brain tumor removal started having shortness of breath yesterday evening after coming from bathroom.  Patient took a nap and woke up following patient started experiencing same shortness of breath which persisted.  Has not had any cough but started having fever chills.  Denies any chest pain.  Given the symptoms patient presented with to the ER.  ED Course: In the ER patient was found to be febrile with temperature around 100 F and chest x-ray shows infiltrates concerning for pneumonia.  COVID-19 test was negative.  Operative site is mild erythematous but no definite signs of infection.  X-ray of the right knee does show some effusion.  Labs show potassium 3.2 glucose 169 hemoglobin 8.8 WBC 13.9 lactic acid 1 platelets 246.  Influenza PCR also is negative.  Patient started on empiric antibiotic admitted for pneumonia.  Review of Systems: As per HPI, rest all negative.   Past Medical History:  Diagnosis Date  . Anxiety state, unspecified   . Benign neoplasm of colon   . Brain tumor ([place])   . Cancer ([place])   . Colon polyps    hyperplastic  . Complication of anesthesia    requests dilaudid for pain  . Complications affecting other specified body systems, hypertension    No cardiologist   . Contact lens/glasses fitting    wears glassses or contacts  . Depressive disorder, not elsewhere classified   . Dyspnea    due to fibrosis  . Esophageal reflux   . Esophageal ring   . Family history of colon cancer mother  . Fundic gland polyps of stomach, benign   .  Hemorrhoids   . Hiatal hernia   . History of kidney stones   . Hypertension   . Hypothyroidism   . Lymphocytic colitis   . Obesity   . Other and unspecified hyperlipidemia   . Pneumonia 02/28/2019  . Pulmonary fibrosis ([place])   . Seizures ([place])    x 1    Past Surgical History:  Procedure Laterality Date  . ABDOMINAL HYSTERECTOMY    . APPENDECTOMY    . BACK SURGERY  2014   rod in lower back  . BRAIN SURGERY  1999   benign tumor  . CARPAL TUNNEL RELEASE     right  . CATARACT EXTRACTION, BILATERAL  04/2018  . CRANIOTOMY    . EYE SURGERY    . HAMMERTOE RECONSTRUCTION WITH WEIL OSTEOTOMY  07/07/2012   Procedure: HAMMERTOE RECONSTRUCTION WITH WEIL OSTEOTOMY;  Surgeon: Wylene Simmer, MD;  Location: [facility];  Service: Orthopedics;  Laterality: Left;  Left Second through [place]; Left Fifth Toe Flexor Tendon Release, and Left Second through [place] Osteotomy  . ORIF ANKLE FRACTURE Left 04/04/2015   Procedure: OPEN REDUCTION INTERNAL FIXATION (ORIF) LEFT ANKLE FRACTURE;  Surgeon: Wylene Simmer, MD;  Location: [facility];  Service: Orthopedics;  Laterality: Left;  . TOTAL KNEE ARTHROPLASTY Right 05/02/2019   Procedure: TOTAL KNEE ARTHROPLASTY;  Surgeon: Paralee Cancel, MD;  Location: [facility];  Service: Orthopedics;  Laterality: Right;  70 mins     reports that  she quit smoking about 8 years ago. She has a 10.00 pack-year smoking history. She has never used smokeless tobacco. She reports current alcohol use of about 7.0 standard drinks of alcohol per week. She reports that she does not use drugs.  Allergies  Allergen Reactions  . Shellfish Allergy Anaphylaxis    Lip swelling  . Codeine Other (See Comments)    nightmares  . Hydrocod Polst-Cpm Polst Er Itching  . Hydrocodone-Acetaminophen Itching and Other (See Comments)    Has to take benadryl with to tolerate  . Sulfa Antibiotics Rash  . Sulfonamide Derivatives Rash    Family History  Problem Relation  Age of Onset  . Breast cancer Mother   . Colon cancer Mother   . Lung cancer Maternal Uncle   . Bone cancer Maternal Grandmother   . Heart attack Father   . Stomach cancer Neg Hx   . Pancreatic cancer Neg Hx   . Esophageal cancer Neg Hx     Prior to Admission medications   Medication Sig Start Date End Date Taking? Authorizing Provider  acetaminophen (TYLENOL) 500 MG tablet Take 2 tablets (1,000 mg total) by mouth every 8 (eight) hours. 05/03/19  Yes Babish, Rodman Key, PA-C  Ascorbic Acid (VITAMIN C) 1000 MG tablet Take 1,000 mg by mouth daily.     Yes [provider]  aspirin (ASPIRIN CHILDRENS) 81 MG chewable tablet Chew 1 tablet (81 mg total) by mouth 2 (two) times daily. Take for 4 weeks, then resume regular dose. 05/04/19 06/03/19 Yes Babish, Rodman Key, PA-C  budesonide (ENTOCORT EC) 3 MG 24 hr capsule Take 3 capsules (9 mg total) by mouth daily. Take 3,  3mg  tabs in morning as needed Patient taking differently: Take 6 mg by mouth daily.  03/27/19  Yes Irene Shipper, MD  budesonide-formoterol [facility]) 160-4.5 MCG/ACT inhaler Inhale 2 puffs into the lungs 2 (two) times daily. 02/17/19  Yes Martyn Ehrich, NP  Calcium Carbonate-Vitamin D 600-400 MG-UNIT tablet Take 1 tablet by mouth daily.   Yes [provider]  cetirizine (ZYRTEC) 10 MG tablet Take 10 mg by mouth daily.   Yes [provider]  [employer] HYDROCORTISONE ACETATE 0.5 % cream APPLY TO AFFECTED AREA TOPICALLY AS NEEDED FOR HEMORRHOIDS Patient taking differently: Apply 1 application topically daily as needed (for hemorrhoids).  02/10/16  Yes Irene Shipper, MD  diphenoxylate-atropine (LOMOTIL) 2.5-0.025 MG tablet Take 1 tab by mouth as needed. Patient taking differently: Take 1-2 tablets by mouth 4 (four) times daily as needed for diarrhea or loose stools.  03/27/19  Yes Irene Shipper, MD  docusate sodium (COLACE) 100 MG capsule Take 1 capsule (100 mg total) by mouth 2 (two) times daily. 05/03/19  Yes Babish,  Rodman Key, PA-C  famotidine (PEPCID) 20 MG tablet TAKE 1 TABLET BY MOUTH EVERYDAY AT BEDTIME Patient taking differently: Take 20 mg by mouth at bedtime.  02/02/19  Yes Nandigam, Venia Minks, MD  hydrochlorothiazide (HYDRODIURIL) 25 MG tablet Take 25 mg by mouth daily. 10/03/18  Yes [provider]  HYDROmorphone (DILAUDID) 2 MG tablet Take 1-2 tablets (2-4 mg total) by mouth every 4 (four) hours as needed for severe pain. 05/03/19  Yes Babish, Rodman Key, PA-C  Iron, Ferrous Sulfate, 325 (65 Fe) MG TABS Take 1 tablet by mouth 2 (two) times daily. 03/27/19  Yes Irene Shipper, MD  levothyroxine (SYNTHROID) 125 MCG tablet Take 125 mcg by mouth daily. 12/08/18  Yes [provider]  Melatonin 5 MG TABS Take 7.5  mg by mouth at bedtime.   Yes [provider]  methocarbamol (ROBAXIN) 500 MG tablet Take 1 tablet (500 mg total) by mouth every 6 (six) hours as needed for muscle spasms. 05/03/19  Yes Babish, Rodman Key, PA-C  Omega-3 Fatty Acids (FISH OIL PO) Take 1,400 mg by mouth at bedtime.    Yes [provider]  pantoprazole (PROTONIX) 40 MG tablet TAKE 1 TABLET BY MOUTH EVERY DAY Patient taking differently: Take 40 mg by mouth daily.  02/01/19  Yes Nandigam, Venia Minks, MD  Polyethyl Glycol-Propyl Glycol (LUBRICANT EYE DROPS) 0.4-0.3 % SOLN Place 1 drop into both eyes 3 (three) times daily as needed (dry/irritated eyes.).   Yes [provider]  polyethylene glycol (MIRALAX / GLYCOLAX) 17 g packet Take 17 g by mouth 2 (two) times daily. 05/03/19  Yes Babish, Rodman Key, PA-C  pravastatin (PRAVACHOL) 20 MG tablet Take 20 mg by mouth daily. 03/25/19  Yes [provider]  sertraline (ZOLOFT) 100 MG tablet Take 150 mg by mouth daily. 11/29/18  Yes [provider]  Respiratory Therapy Supplies (FLUTTER) DEVI 1 Device by Does not apply route as directed. 09/26/18   Martyn Ehrich, NP  Spacer/Aero-Holding Josiah Lobo DEVI 1 Device by Does not apply route as directed. 10/14/18    Brand Males, MD    Physical Exam: Constitutional: Moderately built and nourished. Vitals:   05/09/19 2249 05/10/19 0030 05/10/19 0139 05/10/19 0141  BP: (!) 144/68 (!) 147/58 136/61   Pulse: 85 89 79   Resp: 15 18 (!) 26   Temp:    99.2 F (37.3 C)  TempSrc:    Oral  SpO2: 98% 95% 97%   Weight:      Height:       Eyes: Anicteric no pallor. ENMT: No discharge from the ears eyes nose or mouth. Neck: No mass felt.  No neck rigidity. Respiratory: No rhonchi or crepitations. Cardiovascular: S1-S2 heard. Abdomen: Soft nontender bowel sounds present. Musculoskeletal: No edema. Skin: Mild erythema of the right knee around the operative site. Neurologic: Alert awake oriented to time place and person.  Moves all extremities. Psychiatric: Normal.   Labs on Admission: I have personally reviewed following labs and imaging studies  CBC: Recent Labs  Lab 05/03/19 0208 05/09/19 2222  WBC 12.5* 13.9*  NEUTROABS  --  11.3*  HGB 10.3* 8.8*  HCT 34.4* 28.7*  MCV 92.7 91.7  PLT 225 0000000   Basic Metabolic Panel: Recent Labs  Lab 05/03/19 0208 05/09/19 2222  NA 137 135  K 3.8 3.2*  CL 103 103  CO2 26 24  GLUCOSE 132* 169*  BUN 28* 16  CREATININE 0.78 0.76  CALCIUM 9.7 9.8   GFR: Estimated Creatinine Clearance: 67.3 mL/min (by C-G formula based on SCr of 0.76 mg/dL). Liver Function Tests: Recent Labs  Lab 05/09/19 2222  AST 35  ALT 31  ALKPHOS 59  BILITOT 0.6  PROT 6.1*  ALBUMIN 3.4*   No results for input(s): LIPASE, AMYLASE in the last 168 hours. No results for input(s): AMMONIA in the last 168 hours. Coagulation Profile: Recent Labs  Lab 05/09/19 2222  INR 1.0   Cardiac Enzymes: No results for input(s): CKTOTAL, CKMB, CKMBINDEX, TROPONINI in the last 168 hours. BNP (last 3 results) No results for input(s): PROBNP in the last 8760 hours. HbA1C: No results for input(s): HGBA1C in the last 72 hours. CBG: No results for input(s): GLUCAP in the last 168  hours. Lipid Profile: No results for input(s): CHOL, HDL,  LDLCALC, TRIG, CHOLHDL, LDLDIRECT in the last 72 hours. Thyroid Function Tests: No results for input(s): TSH, T4TOTAL, FREET4, T3FREE, THYROIDAB in the last 72 hours. Anemia Panel: No results for input(s): VITAMINB12, FOLATE, FERRITIN, TIBC, IRON, RETICCTPCT in the last 72 hours. Urine analysis:    Component Value Date/Time   COLORURINE YELLOW 05/09/2019 2222   APPEARANCEUR CLEAR 05/09/2019 2222   LABSPEC 1.015 05/09/2019 2222   PHURINE 6.0 05/09/2019 2222   GLUCOSEU NEGATIVE 05/09/2019 2222   HGBUR NEGATIVE 05/09/2019 2222   BILIRUBINUR NEGATIVE 05/09/2019 2222   KETONESUR 5 (A) 05/09/2019 2222   PROTEINUR NEGATIVE 05/09/2019 2222   UROBILINOGEN 1.0 04/17/2013 1151   NITRITE NEGATIVE 05/09/2019 2222   LEUKOCYTESUR NEGATIVE 05/09/2019 2222   Sepsis Labs: @LABRCNTIP (procalcitonin:4,lacticidven:4) ) Recent Results (from the past 240 hour(s))  SARS Coronavirus 2 by RT PCR (hospital order, performed in [facility] hospital lab) Nasopharyngeal Nasopharyngeal Swab     Status: None   Collection Time: 05/09/19 10:48 PM   Specimen: Nasopharyngeal Swab  Result Value Ref Range Status   SARS Coronavirus 2 NEGATIVE NEGATIVE Final    Comment: (NOTE) If result is NEGATIVE SARS-CoV-2 target nucleic acids are NOT DETECTED. The SARS-CoV-2 RNA is generally detectable in upper and lower  respiratory specimens during the acute phase of infection. The lowest  concentration of SARS-CoV-2 viral copies this assay can detect is 250  copies / mL. A negative result does not preclude SARS-CoV-2 infection  and should not be used as the sole basis for treatment or other  patient management decisions.  A negative result may occur with  improper specimen collection / handling, submission of specimen other  than nasopharyngeal swab, presence of viral mutation(s) within the  areas targeted by this assay, and inadequate number of viral copies  (<250  copies / mL). A negative result must be combined with clinical  observations, patient history, and epidemiological information. If result is POSITIVE SARS-CoV-2 target nucleic acids are DETECTED. The SARS-CoV-2 RNA is generally detectable in upper and lower  respiratory specimens dur ing the acute phase of infection.  Positive  results are indicative of active infection with SARS-CoV-2.  Clinical  correlation with patient history and other diagnostic information is  necessary to determine patient infection status.  Positive results do  not rule out bacterial infection or co-infection with other viruses. If result is PRESUMPTIVE POSTIVE SARS-CoV-2 nucleic acids MAY BE PRESENT.   A presumptive positive result was obtained on the submitted specimen  and confirmed on repeat testing.  While 2019 novel coronavirus  (SARS-CoV-2) nucleic acids may be present in the submitted sample  additional confirmatory testing may be necessary for epidemiological  and / or clinical management purposes  to differentiate between  SARS-CoV-2 and other Sarbecovirus currently known to infect humans.  If clinically indicated additional testing with an alternate test  methodology [phone]) is advised. The SARS-CoV-2 RNA is generally  detectable in upper and lower respiratory sp ecimens during the acute  phase of infection. The expected result is Negative. Fact Sheet for Patients:  StrictlyIdeas.no Fact Sheet for Healthcare Providers: BankingDealers.co.za This test is not yet approved or cleared by the [place] FDA and has been authorized for detection and/or diagnosis of SARS-CoV-2 by FDA under an Emergency Use Authorization (EUA).  This EUA will remain in effect (meaning this test can be used) for the duration of the COVID-19 declaration under Section 564(b)(1) of the Act, 21 U.S.C. section 360bbb-3(b)(1), unless the authorization is terminated or revoked  sooner. Performed  at [facility], [place] [address], [place], [place] [address]      Radiological Exams on Admission: Dg Chest 2 View  Result Date: 05/09/2019 CLINICAL DATA:  73 year old female with sepsis. Fever. EXAM: CHEST - 2 VIEW COMPARISON:  Chest radiographs 02/28/2019 and earlier. Chest CT 04/05/2019. FINDINGS: Portable AP semi upright view at 2242 hours. Moderate chronic hiatal hernia. Other mediastinal contours are within normal limits. Visualized tracheal air column is within normal limits. Stable lung volumes. Chronic increased interstitial markings with pulmonary fibrosis suspected on CT last month. New 5 centimeter area of confluent opacity in the left upper lobe partially abutting the hilum. No superimposed pneumothorax or pleural effusion. No other confluent opacity. Paucity of bowel gas in the upper abdomen. IMPRESSION: 1. New 5 cm area of opacity in the left upper lung since the CT last month is compatible with upper lobe pneumonia. No pleural effusion. 2. Underlying pulmonary fibrosis. 3. Moderate chronic hiatal hernia. Electronically Signed   By: Genevie Ann M.D.   On: 05/09/2019 23:02   Dg Knee Complete 4 Views Right  Result Date: 05/09/2019 CLINICAL DATA:  Swelling fever EXAM: RIGHT KNEE - COMPLETE 4+ VIEW COMPARISON:  None. FINDINGS: Status post knee replacement. Normal alignment and no fracture. Generalized soft tissue swelling. Possible knee effusion. IMPRESSION: 1. Status post right knee replacement without acute osseous abnormality 2. Diffuse soft tissue swelling with possible knee effusion Electronically Signed   By: Donavan Foil M.D.   On: 05/09/2019 23:01    EKG: Independently reviewed.  Normal sinus rhythm.  Assessment/Plan Principal Problem:   CAP (community acquired pneumonia) Active Problems:   Benign essential tremor   History of benign brain tumor   Asthma   S/P right TKA    1. Community-acquired pneumonia -we will keep patient on  empiric antibiotics.  At the time of my exam patient appears short of breath stable.  Will get a CT angiogram of the chest given that patient had recent knee surgery.  Check urine for Legionella and strep antigen.  Follow cultures. 2. Anemia likely from acute blood loss from recent surgery.  Follow CBC.  Iron pills. 3. Hypothyroidism on Synthroid. 4. Hypertension on hydrochlorothiazide. 5. History of asthma presently not wheezing. 6. Hyperlipidemia on statins. 7. Hypothyroid Synthroid. 8. Recent night knee surgery. 9. History of interstitial lung disease being followed by Dr. Fuller Plan. 10. History of benign brain tumor status post removal. 11. History of essential tremor.  Given the patient has acute hypoxic respiratory failure with pneumonia will need more than 2 midnight stay in as inpatient.   DVT prophylaxis: Lovenox. Code Status: Full code. Family Communication: Discussed with patient. Disposition Plan: Home. Consults called: None. Admission status: Inpatient.   Rise Patience MD [facility] Pager [phone].  If 7PM-7AM, please contact night-coverage [email or web address] Password [email or web address]  05/10/2019, 2:00 AM

## 2019-05-10 NOTE — ED Notes (Signed)
ED TO INPATIENT HANDOFF REPORT  Name/Age/Gender [name] 73 y.o. female  Code Status    Code Status Orders  (From admission, onward)         Start     Ordered   05/10/19 0200  Full code  Continuous     05/10/19 0200        Code Status History    Date Active Date Inactive Code Status Order ID Comments User Context   05/02/2019 1047 05/03/2019 1724 Full Code [id number]  Rudi Coco Inpatient   04/04/2015 1559 04/05/2015 1928 Full Code [id number]  Wylene Simmer, MD Inpatient   04/03/2015 1134 04/04/2015 1554 Full Code [id number]  Corky Sing, PA-C Inpatient   Advance Care Planning Activity    Advance Directive Documentation     Most Recent Value  Type of Advance Directive  Healthcare Power of Attorney, Living will  Pre-existing out of facility DNR order (yellow form or pink MOST form)  -  "MOST" Form in Place?  -      Home/SNF/Other Home  Chief Complaint Fever  Level of Care/Admitting Diagnosis ED Disposition    ED Disposition Condition [place]: [place] [[id number]]  Level of Care: Telemetry [5]  Admit to tele based on following criteria: Monitor for Ischemic changes  Covid Evaluation: Asymptomatic Screening Protocol (No Symptoms)  Diagnosis: CAP (community acquired pneumonia) [id number]  Admitting Physician: Rise Patience [phone]  Attending Physician: Rise Patience [phone]  Estimated length of stay: past midnight tomorrow  Certification:: I certify this patient will need inpatient services for at least 2 midnights  PT Class (Do Not Modify): Inpatient [101]  PT Acc Code (Do Not Modify): Private [1]       Medical History Past Medical History:  Diagnosis Date  . Anxiety state, unspecified   . Benign neoplasm of colon   . Brain tumor ([place])   . Cancer ([place])   . Colon polyps    hyperplastic  . Complication of anesthesia    requests dilaudid for pain  . Complications affecting other specified  body systems, hypertension    No cardiologist   . Contact lens/glasses fitting    wears glassses or contacts  . Depressive disorder, not elsewhere classified   . Dyspnea    due to fibrosis  . Esophageal reflux   . Esophageal ring   . Family history of colon cancer mother  . Fundic gland polyps of stomach, benign   . Hemorrhoids   . Hiatal hernia   . History of kidney stones   . Hypertension   . Hypothyroidism   . Lymphocytic colitis   . Obesity   . Other and unspecified hyperlipidemia   . Pneumonia 02/28/2019  . Pulmonary fibrosis ([place])   . Seizures ([facility])    x 1    Allergies Allergies  Allergen Reactions  . Shellfish Allergy Anaphylaxis    Lip swelling  . Codeine Other (See Comments)    nightmares  . Hydrocod Polst-Cpm Polst Er Itching  . Hydrocodone-Acetaminophen Itching and Other (See Comments)    Has to take benadryl with to tolerate  . Sulfa Antibiotics Rash  . Sulfonamide Derivatives Rash    IV Location/Drains/Wounds Patient Lines/Drains/Airways Status   Active Line/Drains/Airways    Name:   Placement date:   Placement time:   Site:   Days:   Peripheral IV 05/09/19 Left Forearm   05/09/19    2223    Forearm  1   Peripheral IV 05/10/19 Right Antecubital   05/10/19    0137    Antecubital   less than 1   Incision 04/12/13 Back Other (Comment)   04/12/13    1314     2219   Incision (Closed) 04/04/15 Ankle Left   04/04/15    1411     1497   Incision (Closed) 05/02/19 Knee   05/02/19    0841     8          Labs/Imaging Results for orders placed or performed during the hospital encounter of 05/09/19 (from the past 48 hour(s))  APTT     Status: None   Collection Time: 05/09/19 10:21 PM  Result Value Ref Range   aPTT 26 24 - 36 seconds    Comment: Performed at [facility], [place] [address] [place], [place] [address]  Comprehensive metabolic panel     Status: Abnormal   Collection Time: 05/09/19 10:22 PM  Result Value Ref Range   Sodium  135 135 - 145 mmol/L   Potassium 3.2 (L) 3.5 - 5.1 mmol/L   Chloride 103 98 - 111 mmol/L   CO2 24 22 - 32 mmol/L   Glucose, Bld 169 (H) 70 - 99 mg/dL   BUN 16 8 - 23 mg/dL   Creatinine, Ser 0.76 0.44 - 1.00 mg/dL   Calcium 9.8 8.9 - 10.3 mg/dL   Total Protein 6.1 (L) 6.5 - 8.1 g/dL   Albumin 3.4 (L) 3.5 - 5.0 g/dL   AST 35 15 - 41 U/L   ALT 31 0 - 44 U/L   Alkaline Phosphatase 59 38 - 126 U/L   Total Bilirubin 0.6 0.3 - 1.2 mg/dL   GFR calc non Af Amer >60 >60 mL/min   GFR calc Af Amer >60 >60 mL/min   Anion gap 8 5 - 15    Comment: Performed at [facility], [place] [address], [place], [place] [address]  Lactic acid, plasma     Status: None   Collection Time: 05/09/19 10:22 PM  Result Value Ref Range   Lactic Acid, Venous 1.0 0.5 - 1.9 mmol/L    Comment: Performed at [facility], [place] [address], [place], [place] [address]  CBC with Differential     Status: Abnormal   Collection Time: 05/09/19 10:22 PM  Result Value Ref Range   WBC 13.9 (H) 4.0 - 10.5 K/uL   RBC 3.13 (L) 3.87 - 5.11 MIL/uL   Hemoglobin 8.8 (L) 12.0 - 15.0 g/dL   HCT 28.7 (L) 36.0 - 46.0 %   MCV 91.7 80.0 - 100.0 fL   MCH 28.1 26.0 - 34.0 pg   MCHC 30.7 30.0 - 36.0 g/dL   RDW 22.6 (H) 11.5 - 15.5 %   Platelets 246 150 - 400 K/uL   nRBC 0.0 0.0 - 0.2 %   Neutrophils Relative % 82 %   Neutro Abs 11.3 (H) 1.7 - 7.7 K/uL   Lymphocytes Relative 8 %   Lymphs Abs 1.1 0.7 - 4.0 K/uL   Monocytes Relative 8 %   Monocytes Absolute 1.1 (H) 0.1 - 1.0 K/uL   Eosinophils Relative 1 %   Eosinophils Absolute 0.2 0.0 - 0.5 K/uL   Basophils Relative 0 %   Basophils Absolute 0.1 0.0 - 0.1 K/uL   Immature Granulocytes 1 %   Abs Immature Granulocytes 0.08 (H) 0.00 - 0.07 K/uL   Polychromasia PRESENT    Ovalocytes PRESENT  Comment: Performed at [facility], [place] [address], [place], [place] [address]  Protime-INR     Status: None   Collection Time: 05/09/19 10:22  PM  Result Value Ref Range   Prothrombin Time 13.1 11.4 - 15.2 seconds   INR 1.0 0.8 - 1.2    Comment: (NOTE) INR goal varies based on device and disease states. Performed at [facility], [place] [address] [place], [place] [address]   Urinalysis, Routine w reflex microscopic     Status: Abnormal   Collection Time: 05/09/19 10:22 PM  Result Value Ref Range   Color, Urine YELLOW YELLOW   APPearance CLEAR CLEAR   Specific Gravity, Urine 1.015 1.005 - 1.030   pH 6.0 5.0 - 8.0   Glucose, UA NEGATIVE NEGATIVE mg/dL   Hgb urine dipstick NEGATIVE NEGATIVE   Bilirubin Urine NEGATIVE NEGATIVE   Ketones, ur 5 (A) NEGATIVE mg/dL   Protein, ur NEGATIVE NEGATIVE mg/dL   Nitrite NEGATIVE NEGATIVE   Leukocytes,Ua NEGATIVE NEGATIVE    Comment: Performed at [place] [address], [place], [place] [address]  SARS Coronavirus 2 by RT PCR (hospital order, performed in [facility] hospital lab) Nasopharyngeal Nasopharyngeal Swab     Status: None   Collection Time: 05/09/19 10:48 PM   Specimen: Nasopharyngeal Swab  Result Value Ref Range   SARS Coronavirus 2 NEGATIVE NEGATIVE    Comment: (NOTE) If result is NEGATIVE SARS-CoV-2 target nucleic acids are NOT DETECTED. The SARS-CoV-2 RNA is generally detectable in upper and lower  respiratory specimens during the acute phase of infection. The lowest  concentration of SARS-CoV-2 viral copies this assay can detect is 250  copies / mL. A negative result does not preclude SARS-CoV-2 infection  and should not be used as the sole basis for treatment or other  patient management decisions.  A negative result may occur with  improper specimen collection / handling, submission of specimen other  than nasopharyngeal swab, presence of viral mutation(s) within the  areas targeted by this assay, and inadequate number of viral copies  (<250 copies / mL). A negative result must be combined with clinical  observations,  patient history, and epidemiological information. If result is POSITIVE SARS-CoV-2 target nucleic acids are DETECTED. The SARS-CoV-2 RNA is generally detectable in upper and lower  respiratory specimens dur ing the acute phase of infection.  Positive  results are indicative of active infection with SARS-CoV-2.  Clinical  correlation with patient history and other diagnostic information is  necessary to determine patient infection status.  Positive results do  not rule out bacterial infection or co-infection with other viruses. If result is PRESUMPTIVE POSTIVE SARS-CoV-2 nucleic acids MAY BE PRESENT.   A presumptive positive result was obtained on the submitted specimen  and confirmed on repeat testing.  While 2019 novel coronavirus  (SARS-CoV-2) nucleic acids may be present in the submitted sample  additional confirmatory testing may be necessary for epidemiological  and / or clinical management purposes  to differentiate between  SARS-CoV-2 and other Sarbecovirus currently known to infect humans.  If clinically indicated additional testing with an alternate test  methodology [phone]) is advised. The SARS-CoV-2 RNA is generally  detectable in upper and lower respiratory sp ecimens during the acute  phase of infection. The expected result is Negative. Fact Sheet for Patients:  StrictlyIdeas.no Fact Sheet for Healthcare Providers: BankingDealers.co.za This test is not yet approved or cleared by the [place] FDA and has been authorized for  detection and/or diagnosis of SARS-CoV-2 by FDA under an Emergency Use Authorization (EUA).  This EUA will remain in effect (meaning this test can be used) for the duration of the COVID-19 declaration under Section 564(b)(1) of the Act, 21 U.S.C. section 360bbb-3(b)(1), unless the authorization is terminated or revoked sooner. Performed at [facility], [place] [address] [place], [place] [address]    Dg Chest 2 View  Result Date: 05/09/2019 CLINICAL DATA:  73 year old female with sepsis. Fever. EXAM: CHEST - 2 VIEW COMPARISON:  Chest radiographs 02/28/2019 and earlier. Chest CT 04/05/2019. FINDINGS: Portable AP semi upright view at 2242 hours. Moderate chronic hiatal hernia. Other mediastinal contours are within normal limits. Visualized tracheal air column is within normal limits. Stable lung volumes. Chronic increased interstitial markings with pulmonary fibrosis suspected on CT last month. New 5 centimeter area of confluent opacity in the left upper lobe partially abutting the hilum. No superimposed pneumothorax or pleural effusion. No other confluent opacity. Paucity of bowel gas in the upper abdomen. IMPRESSION: 1. New 5 cm area of opacity in the left upper lung since the CT last month is compatible with upper lobe pneumonia. No pleural effusion. 2. Underlying pulmonary fibrosis. 3. Moderate chronic hiatal hernia. Electronically Signed   By: Genevie Ann M.D.   On: 05/09/2019 23:02   Dg Knee Complete 4 Views Right  Result Date: 05/09/2019 CLINICAL DATA:  Swelling fever EXAM: RIGHT KNEE - COMPLETE 4+ VIEW COMPARISON:  None. FINDINGS: Status post knee replacement. Normal alignment and no fracture. Generalized soft tissue swelling. Possible knee effusion. IMPRESSION: 1. Status post right knee replacement without acute osseous abnormality 2. Diffuse soft tissue swelling with possible knee effusion Electronically Signed   By: Donavan Foil M.D.   On: 05/09/2019 23:01    Pending Labs Unresulted Labs (From admission, onward)    Start     Ordered   05/17/19 0500  Creatinine, serum  (enoxaparin (LOVENOX)    CrCl >/= 30 ml/min)  Weekly,   R    Comments: while on enoxaparin therapy    05/10/19 0200   05/10/19 [id number]  Basic metabolic panel  Tomorrow morning,   R     05/10/19 0159   05/10/19 0500  CBC  Tomorrow morning,   R     05/10/19 0159   05/10/19 0159  Strep  pneumoniae urinary antigen  Once,   STAT     05/10/19 0159   05/10/19 0159  CBC  (enoxaparin (LOVENOX)    CrCl >/= 30 ml/min)  Once,   STAT    Comments: Baseline for enoxaparin therapy IF NOT ALREADY DRAWN.  Notify MD if PLT < 100 K.    05/10/19 0200   05/10/19 0159  Creatinine, serum  (enoxaparin (LOVENOX)    CrCl >/= 30 ml/min)  Once,   STAT    Comments: Baseline for enoxaparin therapy IF NOT ALREADY DRAWN.    05/10/19 0200   05/10/19 0158  Legionella Pneumophila Serogp 1 Ur Ag  Once,   STAT     05/10/19 0159   05/10/19 0020  Influenza panel by PCR (type A & B)  (Influenza PCR Panel)  Once,   STAT     05/10/19 0019   05/09/19 2221  Urine culture  ONCE - STAT,   STAT     05/09/19 2221   05/09/19 2201  Culture, blood (Routine x 2)  BLOOD CULTURE X 2,   STAT     05/09/19 2200  Vitals/Pain Today's Vitals   05/09/19 2249 05/10/19 0030 05/10/19 0139 05/10/19 0141  BP: (!) 144/68 (!) 147/58 136/61   Pulse: 85 89 79   Resp: 15 18 (!) 26   Temp:    99.2 F (37.3 C)  TempSrc:    Oral  SpO2: 98% 95% 97%   Weight:      Height:      PainSc:        Isolation Precautions Droplet precaution  Medications Medications  hydrochlorothiazide (HYDRODIURIL) tablet 25 mg (has no administration in time range)  pravastatin (PRAVACHOL) tablet 20 mg (has no administration in time range)  sertraline (ZOLOFT) tablet 150 mg (has no administration in time range)  budesonide (ENTOCORT EC) 24 hr capsule 6 mg (has no administration in time range)  levothyroxine (SYNTHROID) tablet 125 mcg (has no administration in time range)  famotidine (PEPCID) tablet 20 mg (has no administration in time range)  pantoprazole (PROTONIX) EC tablet 40 mg (has no administration in time range)  ferrous sulfate tablet 325 mg (has no administration in time range)  Melatonin TABS 7.5 mg (has no administration in time range)  mometasone-formoterol (DULERA) 200-5 MCG/ACT inhaler 2 puff (has no administration in time  range)  acetaminophen (TYLENOL) tablet 650 mg (has no administration in time range)    Or  acetaminophen (TYLENOL) suppository 650 mg (has no administration in time range)  ondansetron (ZOFRAN) tablet 4 mg (has no administration in time range)    Or  ondansetron (ZOFRAN) injection 4 mg (has no administration in time range)  cefTRIAXone (ROCEPHIN) 2 g in sodium chloride 0.9 % 100 mL IVPB (has no administration in time range)  azithromycin (ZITHROMAX) 500 mg in sodium chloride 0.9 % 250 mL IVPB (has no administration in time range)  enoxaparin (LOVENOX) injection 40 mg (has no administration in time range)  morphine 2 MG/ML injection 1 mg (has no administration in time range)  ceFEPIme (MAXIPIME) 2 g in sodium chloride 0.9 % 100 mL IVPB (0 g Intravenous Stopped 05/09/19 2322)  metroNIDAZOLE (FLAGYL) IVPB 500 mg (0 mg Intravenous Stopped 05/10/19 0001)  vancomycin (VANCOCIN) 2,000 mg in sodium chloride 0.9 % 500 mL IVPB (0 mg Intravenous Stopped 05/10/19 0126)    Mobility walks with device

## 2019-05-10 NOTE — ED Notes (Signed)
Patient transported to CT 

## 2019-05-10 NOTE — ED Notes (Addendum)
Pt will be transported after CT angio.

## 2019-05-11 DIAGNOSIS — D500 Iron deficiency anemia secondary to blood loss (chronic): Secondary | ICD-10-CM

## 2019-05-11 DIAGNOSIS — J849 Interstitial pulmonary disease, unspecified: Secondary | ICD-10-CM

## 2019-05-11 DIAGNOSIS — I10 Essential (primary) hypertension: Secondary | ICD-10-CM | POA: Diagnosis present

## 2019-05-11 DIAGNOSIS — Z96651 Presence of right artificial knee joint: Secondary | ICD-10-CM

## 2019-05-11 DIAGNOSIS — K52832 Lymphocytic colitis: Secondary | ICD-10-CM

## 2019-05-11 DIAGNOSIS — E039 Hypothyroidism, unspecified: Secondary | ICD-10-CM | POA: Diagnosis present

## 2019-05-11 LAB — BASIC METABOLIC PANEL
Anion gap: 9 (ref 5–15)
BUN: 14 mg/dL (ref 8–23)
CO2: 21 mmol/L — ABNORMAL LOW (ref 22–32)
Calcium: 9.5 mg/dL (ref 8.9–10.3)
Chloride: 106 mmol/L (ref 98–111)
Creatinine, Ser: 0.59 mg/dL (ref 0.44–1.00)
GFR calc Af Amer: >60 mL/min (ref >60)
GFR calc non Af Amer: >60 mL/min (ref >60)
Glucose, Bld: 142 mg/dL — ABNORMAL HIGH (ref 70–99)
Potassium: 3.4 mmol/L — ABNORMAL LOW (ref 3.5–5.1)
Sodium: 136 mmol/L (ref 135–145)

## 2019-05-11 LAB — CBC
HCT: 28.5 % — ABNORMAL LOW (ref 36.0–46.0)
Hemoglobin: 8.6 g/dL — ABNORMAL LOW (ref 12.0–15.0)
MCH: 28.4 pg (ref 26.0–34.0)
MCHC: 30.2 g/dL (ref 30.0–36.0)
MCV: 94.1 fL (ref 80.0–100.0)
Platelets: 250 10*3/uL (ref 150–400)
RBC: 3.03 MIL/uL — ABNORMAL LOW (ref 3.87–5.11)
RDW: 22.6 % — ABNORMAL HIGH (ref 11.5–15.5)
WBC: 12.6 10*3/uL — ABNORMAL HIGH (ref 4.0–10.5)
nRBC: 0 % (ref 0.0–0.2)

## 2019-05-11 MED ORDER — DIPHENOXYLATE-ATROPINE 2.5-0.025 MG PO TABS
1.0000 | ORAL_TABLET | Freq: Four times a day (QID) | ORAL | Status: DC | PRN
  Administered 2019-05-11: 1 via ORAL
  Administered 2019-05-11 – 2019-05-12 (×2): 2 via ORAL
  Filled 2019-05-11: qty 1
  Filled 2019-05-11 (×2): qty 2

## 2019-05-11 MED ORDER — POTASSIUM CHLORIDE 20 MEQ PO PACK
40.0000 meq | PACK | Freq: Once | ORAL | Status: AC
  Administered 2019-05-11: 40 meq via ORAL
  Filled 2019-05-11: qty 2

## 2019-05-11 MED ORDER — LEVOFLOXACIN 750 MG PO TABS
750.0000 mg | ORAL_TABLET | Freq: Every day | ORAL | Status: DC
  Administered 2019-05-11 – 2019-05-12 (×2): 750 mg via ORAL
  Filled 2019-05-11 (×2): qty 1

## 2019-05-11 NOTE — Progress Notes (Signed)
[facility]  PROGRESS NOTE  [name] [id number] DOB: 1945-12-10 DOA: 05/09/2019 PCP: Robyne Peers, MD  Brief History    [name] is a 73 y.o. year old female with medical history significant for chronic interstitial lung disease, lymphocytic colitis on budesonide, iron deficiency anemia, recent right total knee replacement who presented on 05/09/2019 with generalized malaise, shortness of breath, productive cough and was found to have community-acquired pneumonia.  A & P     Community-acquired pneumonia in patient with interstitial lung disease, recent hospitalization and on immunosuppressant therapy (budesonide).  Patient remains afebrile, nontoxic-appearing, no sepsis physiology.  Will de-escalate to Levaquin to cover potential atypicals.  Legionella still pending, strep pneumo, flu panel negative, blood cultures unremarkable.  Given patient's currently on budesonide has high risk factors so we will continue with Levaquin.  Will need ambulatory oxygen saturation test prior to discharge.   Lymphocytic colitis with resultant persistent diarrhea, chronic.  Continue home budesonide, Lomotil as needed.  Denies any abdominal pain.  Followed by GI as outpatient.   Normocytic anemia presumed secondary to iron deficiency anemia, chronic.  Baseline hemoglobin seems to range from 9-12.  Colonoscopy (03/2019) showed small colonic AVM and internal hemorrhoids, EGD at same time showed no acute etiologies for anemia.  On admission has been stable at 8.5.  No active signs of bleeding in hospital.  Continue to monitor CBC, continue oral iron   Recent right total knee replacement.  Postoperative dressing in place, no active signs of infection.  Continue pain control as needed, elevate   Interstitial lung disease, chronic, unclear etiology.  Followed by Dr. Chase Caller as outpatient.  Continue Dulera   HTN, stable.  Continue HCTZ   HLD, stable.  Continue  statins   Hypothyroidism, stable.  Continue Synthroid     DVT prophylaxis: Continue Lovenox Code Status: Full code Family Communication: Husband updated at bedside Disposition Plan: Transition to oral antibiotics, amatory O2 testing, monitoring hemoglobin, monitoring diarrhea, anticipate discharge next 24 hours.     [facility] Direct contact: see [email or web address] (further directions at bottom of note if needed) 7PM-7AM contact night coverage as at bottom of note 05/11/2019, 4:19 PM  LOS: 2 days   Consultants   None  Procedures   None  Antibiotics   Ceftriaxone, azithromycin, Levaquin  Interval History/Subjective  Minimal cough Feels breathing is much better  episode of large-volume diarrhea  Objective   Vitals:  Vitals:   05/11/19 0822 05/11/19 1336  BP:  (!) 159/75  Pulse:  77  Resp:  (!) 21  Temp:  98.7 F (37.1 C)  SpO2: 96% 99%    Exam:  Awake Alert, Oriented X 3, No new F.N deficits, Normal affect No JVD Clear breath sounds bilaterally, normal story effort on room air Soft abdomen, nontender, nondistended Right knee with postoperative dressing in place, no peripheral edema   I have personally reviewed the following:   Data Reviewed: Basic Metabolic Panel: Recent Labs  Lab 05/09/19 2222 05/10/19 0519 05/11/19 0358  NA 135 137 136  K 3.2* 3.4* 3.4*  CL 103 104 106  CO2 24 25 21*  GLUCOSE 169* 151* 142*  BUN 16 14 14   CREATININE 0.76 0.58 0.59  CALCIUM 9.8 9.4 9.5  MG  --  2.2  --    Liver Function Tests: Recent Labs  Lab 05/09/19 2222  AST 35  ALT 31  ALKPHOS 59  BILITOT 0.6  PROT 6.1*  ALBUMIN 3.4*   No results for input(s):  LIPASE, AMYLASE in the last 168 hours. No results for input(s): AMMONIA in the last 168 hours. CBC: Recent Labs  Lab 05/09/19 2222 05/10/19 0519 05/11/19 0358  WBC 13.9* 12.4* 12.6*  NEUTROABS 11.3*  --   --   HGB 8.8* 8.5* 8.6*  HCT 28.7* 27.4* 28.5*  MCV 91.7 91.3 94.1  PLT 246 237 250    Cardiac Enzymes: No results for input(s): CKTOTAL, CKMB, CKMBINDEX, TROPONINI in the last 168 hours. BNP (last 3 results) No results for input(s): BNP in the last 8760 hours.  ProBNP (last 3 results) No results for input(s): PROBNP in the last 8760 hours.  CBG: No results for input(s): GLUCAP in the last 168 hours.  Recent Results (from the past 240 hour(s))  Urine culture     Status: Abnormal (Preliminary result)   Collection Time: 05/09/19 10:21 PM   Specimen: In/Out Cath Urine  Result Value Ref Range Status   Specimen Description   Final    IN/OUT CATH URINE Performed at [facility] [address] [place], [place] [address]    Special Requests   Final    NONE Performed at [facility], [place] [address], [place], [place] [address]    Culture (A)  Final    >=100,000 COLONIES/mL ENTEROCOCCUS FAECALIS SUSCEPTIBILITIES TO FOLLOW Performed at [facility], [place] [address] [place], [place] [address]    Report Status PENDING  Incomplete  Culture, blood (Routine x 2)     Status: None (Preliminary result)   Collection Time: 05/09/19 10:22 PM   Specimen: BLOOD  Result Value Ref Range Status   Specimen Description   Final    BLOOD RIGHT ANTECUBITAL Performed at [facility] [address], [place], [place] [address]    Special Requests   Final    BOTTLES DRAWN AEROBIC AND ANAEROBIC Blood Culture results may not be optimal due to an excessive volume of blood received in culture bottles Performed at [facility] [address] [place], [place] [address]    Culture   Final    NO GROWTH 2 DAYS Performed at [facility] [address] [place], [place] [address]    Report Status PENDING  Incomplete  Culture, blood (Routine x 2)     Status: None (Preliminary result)   Collection Time: 05/09/19 10:22 PM   Specimen: BLOOD  Result Value Ref Range Status   Specimen Description   Final     BLOOD BLOOD LEFT FOREARM Performed at [facility] [address], [place], [place] [address]    Special Requests   Final    BOTTLES DRAWN AEROBIC AND ANAEROBIC Blood Culture results may not be optimal due to an excessive volume of blood received in culture bottles Performed at [facility] [address], [place], [place] [address]    Culture   Final    NO GROWTH 2 DAYS Performed at [facility] [address] [place], [place] [address]    Report Status PENDING  Incomplete  SARS Coronavirus 2 by RT PCR (hospital order, performed in [facility] hospital lab) Nasopharyngeal Nasopharyngeal Swab     Status: None   Collection Time: 05/09/19 10:48 PM   Specimen: Nasopharyngeal Swab  Result Value Ref Range Status   SARS Coronavirus 2 NEGATIVE NEGATIVE Final    Comment: (NOTE) If result is NEGATIVE SARS-CoV-2 target nucleic acids are NOT DETECTED. The SARS-CoV-2 RNA is generally detectable  in upper and lower  respiratory specimens during the acute phase of infection. The lowest  concentration of SARS-CoV-2 viral copies this assay can detect is 250  copies / mL. A negative result does not preclude SARS-CoV-2 infection  and should not be used as the sole basis for treatment or other  patient management decisions.  A negative result may occur with  improper specimen collection / handling, submission of specimen other  than nasopharyngeal swab, presence of viral mutation(s) within the  areas targeted by this assay, and inadequate number of viral copies  (<250 copies / mL). A negative result must be combined with clinical  observations, patient history, and epidemiological information. If result is POSITIVE SARS-CoV-2 target nucleic acids are DETECTED. The SARS-CoV-2 RNA is generally detectable in upper and lower  respiratory specimens dur ing the acute phase of infection.  Positive  results are indicative of active infection with SARS-CoV-2.   Clinical  correlation with patient history and other diagnostic information is  necessary to determine patient infection status.  Positive results do  not rule out bacterial infection or co-infection with other viruses. If result is PRESUMPTIVE POSTIVE SARS-CoV-2 nucleic acids MAY BE PRESENT.   A presumptive positive result was obtained on the submitted specimen  and confirmed on repeat testing.  While 2019 novel coronavirus  (SARS-CoV-2) nucleic acids may be present in the submitted sample  additional confirmatory testing may be necessary for epidemiological  and / or clinical management purposes  to differentiate between  SARS-CoV-2 and other Sarbecovirus currently known to infect humans.  If clinically indicated additional testing with an alternate test  methodology [phone]) is advised. The SARS-CoV-2 RNA is generally  detectable in upper and lower respiratory sp ecimens during the acute  phase of infection. The expected result is Negative. Fact Sheet for Patients:  StrictlyIdeas.no Fact Sheet for Healthcare Providers: BankingDealers.co.za This test is not yet approved or cleared by the [place] FDA and has been authorized for detection and/or diagnosis of SARS-CoV-2 by FDA under an Emergency Use Authorization (EUA).  This EUA will remain in effect (meaning this test can be used) for the duration of the COVID-19 declaration under Section 564(b)(1) of the Act, 21 U.S.C. section 360bbb-3(b)(1), unless the authorization is terminated or revoked sooner. Performed at [facility], [place] [address], [place], [place] [address]      Studies: Dg Chest 2 View  Result Date: 05/09/2019 CLINICAL DATA:  73 year old female with sepsis. Fever. EXAM: CHEST - 2 VIEW COMPARISON:  Chest radiographs 02/28/2019 and earlier. Chest CT 04/05/2019. FINDINGS: Portable AP semi upright view at 2242 hours. Moderate chronic hiatal hernia.  Other mediastinal contours are within normal limits. Visualized tracheal air column is within normal limits. Stable lung volumes. Chronic increased interstitial markings with pulmonary fibrosis suspected on CT last month. New 5 centimeter area of confluent opacity in the left upper lobe partially abutting the hilum. No superimposed pneumothorax or pleural effusion. No other confluent opacity. Paucity of bowel gas in the upper abdomen. IMPRESSION: 1. New 5 cm area of opacity in the left upper lung since the CT last month is compatible with upper lobe pneumonia. No pleural effusion. 2. Underlying pulmonary fibrosis. 3. Moderate chronic hiatal hernia. Electronically Signed   By: Genevie Ann M.D.   On: 05/09/2019 23:02   Ct Angio Chest Pe W Or Wo Contrast  Result Date: 05/10/2019 CLINICAL DATA:  Shortness of breath with fever.  Recent knee surgery EXAM: CT ANGIOGRAPHY CHEST WITH CONTRAST TECHNIQUE:  Multidetector CT imaging of the chest was performed using the standard protocol during bolus administration of intravenous contrast. Multiplanar CT image reconstructions and MIPs were obtained to evaluate the vascular anatomy. CONTRAST:  146mL OMNIPAQUE IOHEXOL 350 MG/ML SOLN COMPARISON:  04/05/2019 FINDINGS: Cardiovascular: Suboptimal pulmonary artery opacification in the upper lobes with no confidence for detecting emboli beyond the lobar to proximal segmental level. The other vessels are well opacified and negative for filling defect. Normal heart size. No pericardial effusion. Mild atherosclerotic calcification of the aorta. Mediastinum/Nodes: Moderate hiatal hernia. No worrisome lymph nodes. Lungs/Pleura: There is a background of interstitial opacity with architectural distortion and traction bronchiectasis on prior CT. There is now superimposed patchy airspace disease in the bilateral lungs. No acute septal thickening for edema. No pleural effusion or pneumothorax Upper Abdomen: No acute finding Musculoskeletal: No  acute or aggressive finding Review of the MIP images confirms the above findings. IMPRESSION: 1. Acute patchy airspace disease, favor atypical pneumonia. Given there is a background of chronic interstitial lung disease acute alveolitis is also considered. 2. No evidence for pulmonary embolism but notably limited in the upper lobes due to bolus timing. Electronically Signed   By: Monte Fantasia M.D.   On: 05/10/2019 04:18   Dg Knee Complete 4 Views Right  Result Date: 05/09/2019 CLINICAL DATA:  Swelling fever EXAM: RIGHT KNEE - COMPLETE 4+ VIEW COMPARISON:  None. FINDINGS: Status post knee replacement. Normal alignment and no fracture. Generalized soft tissue swelling. Possible knee effusion. IMPRESSION: 1. Status post right knee replacement without acute osseous abnormality 2. Diffuse soft tissue swelling with possible knee effusion Electronically Signed   By: Donavan Foil M.D.   On: 05/09/2019 23:01    Scheduled Meds:  budesonide  6 mg Oral Daily   enoxaparin (LOVENOX) injection  40 mg Subcutaneous Q24H   famotidine  20 mg Oral QHS   ferrous sulfate  325 mg Oral BID   hydrochlorothiazide  25 mg Oral Daily   levofloxacin  750 mg Oral QHS   levothyroxine  125 mcg Oral Q0600   Melatonin  7.5 mg Oral QHS   mometasone-formoterol  2 puff Inhalation BID   pantoprazole  40 mg Oral Daily   pravastatin  20 mg Oral Daily   sertraline  150 mg Oral Daily   Continuous Infusions:  sodium chloride Stopped (05/10/19 0510)    Principal Problem:   CAP (community acquired pneumonia) Active Problems:   Benign essential tremor   History of benign brain tumor   Asthma   S/P right TKA      Desiree Hane  [facility]

## 2019-05-12 LAB — URINE CULTURE: Culture: >=100000 — AB

## 2019-05-12 LAB — LEGIONELLA PNEUMOPHILA SEROGP 1 UR AG: L. pneumophila Serogp 1 Ur Ag: NEGATIVE

## 2019-05-12 NOTE — TOC Progression Note (Signed)
Transition of Care [facility]) - Progression Note    Patient Details  Name: [name] MRN: [id number] Date of Birth: 1945-12-17  [facility]) CM/SW Contact  Purcell Mouton, RN Phone Number: 05/12/2019, 12:35 PM  Clinical Narrative:     Pt asked for Bedside Commode and states that she will continue with   OP PT.   Expected Discharge Plan: Home/Self Care    Expected Discharge Plan and Services Expected Discharge Plan: Home/Self Care   Discharge Planning Services: CM Consult   Living arrangements for the past 2 months: Single Family Home                                       Social Determinants of Health (SDOH) Interventions    Readmission Risk Interventions No flowsheet data found.

## 2019-05-12 NOTE — Progress Notes (Signed)
PT NOTE    SATURATION QUALIFICATIONS: (This note is used to comply with regulatory documentation for home oxygen)  Patient Saturations on Room Air at Rest = 88%  Patient Saturations on Room Air while Ambulating =86 %  Patient Saturations on 1 Liter of oxygen while Ambulating = 94%  Please briefly explain why patient needs home oxygen: pt requires supplemental O2 to maintain adequate O2 saturations   Kenyon Ana, PT  Pager: [phone] Acute Rehab Dept [facility]): [phone]   05/12/2019

## 2019-05-12 NOTE — Evaluation (Signed)
Physical Therapy Evaluation Patient Details Name: [name] MRN: [id number] DOB: April 30, 1946 Today's Date: 05/12/2019   History of Present Illness  Patient is 73 y.o. female admitted with SOB, fever, dx with pna.  s/p R TKA on 05/02/19 per Dr. Alvan Dame. PMH significant for seizures, brain tumor, craniotomy, pulmonary fibrosis, HTN, hypothyroidism, back surgery, and carpal tunnel release Rt UE.  Clinical Impression  Pt admitted with above diagnosis.  Pt presents with significant DOE with amb,  SpO2 decr to 84% on RA,  Pt placed on 1L, sats incr to 95%.   Pt currently with functional limitations due to the deficits listed below (see PT Problem List). Pt will benefit from skilled PT to increase their independence and safety with mobility to allow discharge to the venue listed below.       Follow Up Recommendations Follow surgeon's recommendation for DC plan and follow-up therapies(?may need HHPT if not allowed back to OP with pna/O2)    Equipment Recommendations  None recommended by PT    Recommendations for Other Services       Precautions / Restrictions Precautions Precautions: Fall;Knee Restrictions Weight Bearing Restrictions: No      Mobility  Bed Mobility Overal bed mobility: Needs Assistance Bed Mobility: Supine to Sit     Supine to sit: Supervision     General bed mobility comments: for safety  Transfers Overall transfer level: Needs assistance Equipment used: Rolling walker (2 wheeled) Transfers: Sit to/from Stand Sit to Stand: Min guard         General transfer comment: cues for hand placement and to control descent  Ambulation/Gait Ambulation/Gait assistance: Min guard Gait Distance (Feet): 180 Feet Assistive device: Rolling walker (2 wheeled) Gait Pattern/deviations: Step-through pattern;Decreased stride length;Decreased stance time - right        Science writer    Modified Rankin (Stroke Patients Only)        Balance                                             Pertinent Vitals/Pain Pain Assessment: 0-10 Pain Location: R knee Pain Descriptors / Indicators: Sore Pain Intervention(s): Limited activity within patient's tolerance;Monitored during session;Repositioned    Home Living Family/patient expects to be discharged to:: Private residence Living Arrangements: Spouse/significant other Available Help at Discharge: Family Type of Home: House Home Access: Stairs to enter Entrance Stairs-Rails: Right;Can reach both Entrance Stairs-Number of Steps: 3 with Rt rail at front, 3 with 2 rails from garage Home Layout: One level Home Equipment: Shower seat;Bedside commode;Walker - 2 wheels;Cane - single point      Prior Function           Comments: uses cane at baseline due to Lt foot drop which developed when she had her brain tumor, pt has AFO at home but does not like to use it because it is uncomfortable     Hand Dominance        Extremity/Trunk Assessment   Upper Extremity Assessment Upper Extremity Assessment: Overall WFL for tasks assessed    Lower Extremity Assessment RLE Deficits / Details: pt with good quad activaiton and no extensor lag with SLR, able to maintain extension with resistance, 4-/5 or greater LLE Deficits / Details: pt with Lt foot drop, 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  Communication   Communication: No difficulties  Cognition Arousal/Alertness: Awake/alert Behavior During Therapy: WFL for tasks assessed/performed Overall Cognitive Status: Within Functional Limits for tasks assessed                                        General Comments      Exercises Total Joint Exercises Ankle Circles/Pumps: AROM;10 reps;Both Quad Sets: AROM;10 reps;Supine;Right Heel Slides: AAROM;10 reps;Right Goniometric ROM: ~5 to  75 degrees flexion AAROM   Assessment/Plan    PT Assessment    PT Problem List Decreased range of  motion;Decreased activity tolerance;Cardiopulmonary status limiting activity;Decreased balance;Decreased mobility       PT Treatment Interventions DME instruction;Functional mobility training;Balance training;Patient/family education;Modalities;Therapeutic activities;Gait training;Stair training;Therapeutic exercise    PT Goals (Current goals can be found in the Care Plan section)  Acute Rehab PT Goals Patient Stated Goal: to return home and walk without pain PT Goal Formulation: With patient Time For Goal Achievement: 05/26/19 Potential to Achieve Goals: Good    Frequency Min 6X/week   Barriers to discharge        Co-evaluation               AM-PAC PT "6 Clicks" Mobility  Outcome Measure Help needed turning from your back to your side while in a flat bed without using bedrails?: None Help needed moving from lying on your back to sitting on the side of a flat bed without using bedrails?: None Help needed moving to and from a bed to a chair (including a wheelchair)?: A Little Help needed standing up from a chair using your arms (e.g., wheelchair or bedside chair)?: A Little Help needed to walk in hospital room?: A Little Help needed climbing 3-5 steps with a railing? : A Little 6 Click Score: 20    End of Session Equipment Utilized During Treatment: Gait belt Activity Tolerance: Patient tolerated treatment well Patient left: in chair;with call bell/phone within reach;with family/visitor present;with nursing/sitter in room Nurse Communication: Mobility status PT Visit Diagnosis: Difficulty in walking, not elsewhere classified (R26.2)    Time: [id number] PT Time Calculation (min) (ACUTE ONLY): 29 min   Charges:   PT Evaluation $PT Eval Low Complexity: 1 Low PT Treatments $Gait Training: 8-22 mins        Kenyon Ana, PT  Pager: [phone] Acute Rehab Dept [facility]): [phone]   05/12/2019   [facility] 05/12/2019, 4:41 PM

## 2019-05-12 NOTE — TOC Progression Note (Signed)
Transition of Care [facility]) - Progression Note    Patient Details  Name: [name] MRN: [id number] Date of Birth: Sep 15, 1945  Transition of Care [facility]) CM/SW Contact  Purcell Mouton, RN Phone Number: 05/12/2019, 4:26 PM  Clinical Narrative:    Pt could not use either BSC's that [employer] delivered. Pt will need to find at a Medical supply store.   Expected Discharge Plan: Home/Self Care    Expected Discharge Plan and Services Expected Discharge Plan: Home/Self Care   Discharge Planning Services: CM Consult   Living arrangements for the past 2 months: Single Family Home                                       Social Determinants of Health (SDOH) Interventions    Readmission Risk Interventions No flowsheet data found.

## 2019-05-12 NOTE — Care Management Important Message (Signed)
Important Message  Patient Details IM Letter given to [name] RN to present to the Patient Name: [name] MRN: [id number] Date of Birth: Jul 25, 1946   Medicare Important Message Given:  Yes     Kerin Salen 05/12/2019, 10:57 AM

## 2019-05-12 NOTE — TOC Progression Note (Signed)
Transition of Care [facility]) - Progression Note    Patient Details  Name: [name] MRN: [id number] Date of Birth: 1945-08-13  Transition of Care [facility]) CM/SW Contact  Purcell Mouton, RN Phone Number: 05/12/2019, 2:23 PM  Clinical Narrative:    Pt would benefit from Bariatric Bedside Commode related to her not being able perform proper hygiene after using the commode. Pt states, "It is hard for me to wipe myself after using regular size commode."   Expected Discharge Plan: Home/Self Care    Expected Discharge Plan and Services Expected Discharge Plan: Home/Self Care   Discharge Planning Services: CM Consult   Living arrangements for the past 2 months: Single Family Home                                       Social Determinants of Health (SDOH) Interventions    Readmission Risk Interventions No flowsheet data found.

## 2019-05-13 ENCOUNTER — Other Ambulatory Visit: Payer: Self-pay | Admitting: Gastroenterology

## 2019-05-13 DIAGNOSIS — J9601 Acute respiratory failure with hypoxia: Secondary | ICD-10-CM

## 2019-05-13 MED ORDER — LEVOFLOXACIN 750 MG PO TABS: 750.0000 mg | ORAL_TABLET | Freq: Every day | ORAL | 0 refills | Status: AC

## 2019-05-13 NOTE — Progress Notes (Signed)
Physical Therapy Treatment Patient Details Name: [name] MRN: [id number] DOB: 1945-12-10 Today's Date: 05/13/2019    History of Present Illness Patient is 73 y.o. female admitted with SOB, fever, dx with pna.  s/p R TKA on 05/02/19 per Dr. Alvan Dame. PMH significant for seizures, brain tumor, craniotomy, pulmonary fibrosis, HTN, hypothyroidism, back surgery, and carpal tunnel release Rt UE.    PT Comments    Pt declined OOB but very motivated to work on HEP/knee ROM; reviewed full HEP with pt,  Reviewed tips for increasing knee flexion and some objective measures for monitoring knee flexion progress at home. Pt appreciative.    Follow Up Recommendations  Follow surgeon's recommendation for DC plan and follow-up therapies     Equipment Recommendations  None recommended by PT    Recommendations for Other Services       Precautions / Restrictions Precautions Precautions: Fall;Knee Restrictions Weight Bearing Restrictions: No    Mobility  Bed Mobility               General bed mobility comments: declined OOB  Transfers                    Ambulation/Gait                 Stairs             Wheelchair Mobility    Modified Rankin (Stroke Patients Only)       Balance                                            Cognition Arousal/Alertness: Awake/alert Behavior During Therapy: WFL for tasks assessed/performed Overall Cognitive Status: Within Functional Limits for tasks assessed                                        Exercises Total Joint Exercises Ankle Circles/Pumps: AROM;10 reps;Both Quad Sets: AROM;10 reps;Supine;Both Heel Slides: AROM;AAROM;Right;10 reps Hip ABduction/ADduction: AROM;AAROM;Right;10 reps Straight Leg Raises: AROM;Right;10 reps Goniometric ROM: ~ 5 to 80 degrees knee flexion in supine    General Comments        Pertinent Vitals/Pain Pain Assessment: 0-10 Pain Score: 3  Pain  Location: R knee Pain Descriptors / Indicators: Sore Pain Intervention(s): Limited activity within patient's tolerance;Monitored during session    Home Living                      Prior Function            PT Goals (current goals can now be found in the care plan section) Acute Rehab PT Goals Patient Stated Goal: to return home and walk without pain PT Goal Formulation: With patient Time For Goal Achievement: 05/26/19 Potential to Achieve Goals: Good Progress towards PT goals: Progressing toward goals    Frequency    Min 6X/week      PT Plan Current plan remains appropriate    Co-evaluation              AM-PAC PT "6 Clicks" Mobility   Outcome Measure  Help needed turning from your back to your side while in a flat bed without using bedrails?: None Help needed moving from lying on your back to sitting on the side of a flat bed  without using bedrails?: None Help needed moving to and from a bed to a chair (including a wheelchair)?: A Little Help needed standing up from a chair using your arms (e.g., wheelchair or bedside chair)?: A Little Help needed to walk in hospital room?: A Little Help needed climbing 3-5 steps with a railing? : A Little 6 Click Score: 20    End of Session   Activity Tolerance: Patient tolerated treatment well Patient left: in bed;with call bell/phone within reach;with bed alarm set   PT Visit Diagnosis: Difficulty in walking, not elsewhere classified (R26.2)     Time: [id number] PT Time Calculation (min) (ACUTE ONLY): 25 min  Charges:  $Therapeutic Exercise: 23-37 mins                     Kenyon Ana, PT  Pager: [phone] Acute Rehab Dept [facility]): [phone]   05/13/2019    [facility] 05/13/2019, 1:24 PM

## 2019-05-13 NOTE — Discharge Summary (Signed)
[name] [id number] DOB: 08/10/1945 DOA: 05/09/2019  PCP: Mackenzie Peers, MD  Admit date: 05/09/2019 Discharge date: 05/13/2019  Admitted From: Home Disposition: Home  Recommendations for Outpatient Follow-up:  1. Follow up with PCP in 1-2 weeks 2. Please obtain BMP/CBC in one week 3. Please follow up on the following pending results:  Home Health:PT Equipment/Devices:1L oxygen Discharge Condition: Stable CODE STATUS: Full Diet recommendation: Heart Healthy   Brief/Interim Summary: History of present illness:  HPI (10/14): [name] is a 73 y.o. 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  History of brain tumor removal started having shortness of breath yesterday evening after coming from bathroom.  Patient took a nap and woke up following patient started experiencing same shortness of breath which persisted.  Has not had any cough but started having fever chills.  Denies any chest pain.  Given the symptoms patient presented with to the ER.  ED Course: In the ER patient was found to be febrile with temperature around 100 F and chest x-ray shows infiltrates concerning for pneumonia.  COVID-19 test was negative.  Operative site is mild erythematous but no definite signs of infection.  X-ray of the right knee does show some effusion.  Labs show potassium 3.2 glucose 169 hemoglobin 8.8 WBC 13.9 lactic acid 1 platelets 246.  Influenza PCR also is negative.  Patient started on empiric antibiotic admitted for pneumonia.   Remaining hospital course addressed in problem based format below:   Hospital Course:    Acute hypoxic respiratory failure secondary to community-acquired pneumonia in patient with interstitial lung disease, recent hospitalization and on immunosuppressant therapy (budesonide).    Due to recent hospitalization  and symptoms consistent with pneumonia CTA chest PE protocol was obtained negative for PE.  Patchy airspace disease seen consistent with pneumonia, clinically improved with empiric vancomycin which was deescalated to ceftriaxone azithromycin 1 MRSA PCR was negative.  Transition to Levaquin he continued to clinically respond.  Legionella, strep pneumo, flu panel, Covid testing and blood cultures all remain negative.  On ambulatory oxygen saturation patient required 1 L to maintain normal oxygen saturation and will be provided O2 on discharge.  Instructed to follow-up closely with PCP and pulmonologist.    Lymphocytic colitis with resultant persistent diarrhea, chronic.  Continue home budesonide, Lomotil as needed.  Denies any abdominal pain.  Followed by GI as outpatient.   Normocytic anemia presumed secondary to iron deficiency anemia, chronic.  Baseline hemoglobin seems to range from 9-12.  Colonoscopy (03/2019) showed small colonic AVM and internal hemorrhoids, EGD at same time showed no acute etiologies for anemia.  On admission has been stable at 8.5.  No active signs of bleeding in hospital. continue oral iron   Recent right total knee replacement (10/6 by Dr. Alvan Brady).  Postoperative dressing in place, no active signs of infection, x-ray shows no acute findings other than effusion near knee, continue oral pain control, has follow-up arranged with Ortho a week after discharge.     Interstitial lung disease, chronic, unclear etiology.  Followed by [name] as outpatient.  Continue Dulera   HTN, stable.  Continue HCTZ   HLD, stable.  Continue statins   Hypothyroidism, stable.  Continue Synthroid   Consultations:  None  Procedures/Studies: None Subjective: Feels well Minimal cough Stable breathing Discharge Exam: Vitals:   05/13/19 0435 05/13/19 0743  BP: 130/65   Pulse: 78  Resp: 18   Temp: 99.1 F (37.3 C)   SpO2: 96% 94%   Vitals:   05/12/19 2030 05/12/19  2124 05/13/19 0435 05/13/19 0743  BP:  (!) 129/57 130/65   Pulse:  75 78   Resp:  18 18   Temp:  99.3 F (37.4 C) 99.1 F (37.3 C)   TempSrc:  Oral Oral   SpO2: 96% 97% 96% 94%  Weight:      Height:        Awake Alert, Oriented X 3, No new F.N deficits, Normal affect No JVD Clear breath sounds bilaterally, normal respiratory effort on 1 L nasal cannula, Soft abdomen, nontender, nondistended Right knee with postoperative dressing in place, no peripheral edema  Discharge Diagnoses:  Principal Problem:   CAP (community acquired pneumonia) Active Problems:   Benign essential tremor   History of benign brain tumor   ILD (interstitial lung disease) (HCC)   Asthma   S/P right TKA   Lymphocytic colitis   Essential hypertension   Iron deficiency anemia due to chronic blood loss   Hypothyroidism    Discharge Instructions  Discharge Instructions    Diet - low sodium heart healthy   Complete by: As directed    Increase activity slowly   Complete by: As directed      Allergies as of 05/13/2019      Reactions   Shellfish Allergy Anaphylaxis   Lip swelling   Codeine Other (See Comments)   nightmares   Hydrocod Polst-cpm Polst Er Itching   Hydrocodone-acetaminophen Itching, Other (See Comments)   Has to take benadryl with to tolerate   Sulfa Antibiotics Rash   Sulfonamide Derivatives Rash      Medication List    TAKE these medications   acetaminophen 500 MG tablet Commonly known as: TYLENOL Take 2 tablets (1,000 mg total) by mouth every 8 (eight) hours.   aspirin 81 MG chewable tablet Commonly known as: Aspirin Childrens Chew 1 tablet (81 mg total) by mouth 2 (two) times daily. Take for 4 weeks, then resume regular dose.   budesonide 3 MG 24 hr capsule Commonly known as: ENTOCORT EC Take 3 capsules (9 mg total) by mouth daily. Take 3,  3mg  tabs in morning as needed What changed:   how much to take  additional instructions   budesonide-formoterol 160-4.5  MCG/ACT inhaler Commonly known as: Symbicort Inhale 2 puffs into the lungs 2 (two) times daily.   Calcium Carbonate-Vitamin D 600-400 MG-UNIT tablet Take 1 tablet by mouth daily.   cetirizine 10 MG tablet Commonly known as: ZYRTEC Take 10 mg by mouth daily.   [employer] Hydrocortisone Acetate 0.5 % cream Generic drug: hydrocortisone acetate APPLY TO AFFECTED AREA TOPICALLY AS NEEDED FOR HEMORRHOIDS What changed: See the new instructions.   diphenoxylate-atropine 2.5-0.025 MG tablet Commonly known as: LOMOTIL Take 1 tab by mouth as needed. What changed:   how much to take  how to take this  when to take this  reasons to take this  additional instructions   docusate sodium 100 MG capsule Commonly known as: Colace Take 1 capsule (100 mg total) by mouth 2 (two) times daily.   famotidine 20 MG tablet Commonly known as: PEPCID TAKE 1 TABLET BY MOUTH EVERYDAY AT BEDTIME What changed: See the new instructions.   FISH OIL PO Take 1,400 mg by mouth at bedtime.   Flutter Devi 1 Device by Does not apply route as directed.   hydrochlorothiazide 25 MG tablet Commonly known as: HYDRODIURIL  Take 25 mg by mouth daily.   HYDROmorphone 2 MG tablet Commonly known as: Dilaudid Take 1-2 tablets (2-4 mg total) by mouth every 4 (four) hours as needed for severe pain.   Iron (Ferrous Sulfate) 325 (65 Fe) MG Tabs Take 1 tablet by mouth 2 (two) times daily.   levofloxacin 750 MG tablet Commonly known as: LEVAQUIN Take 1 tablet (750 mg total) by mouth at bedtime for 3 days.   levothyroxine 125 MCG tablet Commonly known as: SYNTHROID Take 125 mcg by mouth daily.   Lubricant Eye Drops 0.4-0.3 % Soln Generic drug: Polyethyl Glycol-Propyl Glycol Place 1 drop into both eyes 3 (three) times daily as needed (dry/irritated eyes.).   Melatonin 5 MG Tabs Take 7.5 mg by mouth at bedtime.   methocarbamol 500 MG tablet Commonly known as: Robaxin Take 1 tablet (500 mg total) by mouth every 6  (six) hours as needed for muscle spasms.   pantoprazole 40 MG tablet Commonly known as: PROTONIX TAKE 1 TABLET BY MOUTH EVERY DAY   polyethylene glycol 17 g packet Commonly known as: MIRALAX / GLYCOLAX Take 17 g by mouth 2 (two) times daily.   pravastatin 20 MG tablet Commonly known as: PRAVACHOL Take 20 mg by mouth daily.   sertraline 100 MG tablet Commonly known as: ZOLOFT Take 150 mg by mouth daily.   Spacer/Aero-Holding [employer] 1 Device by Does not apply route as directed.   vitamin C 1000 MG tablet Take 1,000 mg by mouth daily.            Durable Medical Equipment  (From admission, onward)         Start     Ordered   05/12/19 1719  For home use only DME oxygen  Once    Question Answer Comment  Length of Need 6 Months   Mode or (Route) Nasal cannula   Liters per Minute 1   Frequency Continuous (stationary and portable oxygen unit needed)   Oxygen delivery system Gas      05/12/19 1718   05/12/19 1420  For home use only DME Bedside commode  Once    Comments: Bariatric Bedside Commode  Question:  Patient needs a bedside commode to treat with the following condition  Answer:  Fear for personal safety   05/12/19 1420   05/12/19 1233  For home use only DME Bedside commode  Once    Question:  Patient needs a bedside commode to treat with the following condition  Answer:  Fear for personal safety   05/12/19 1233          Allergies  Allergen Reactions  . Shellfish Allergy Anaphylaxis    Lip swelling  . Codeine Other (See Comments)    nightmares  . Hydrocod Polst-Cpm Polst Er Itching  . Hydrocodone-Acetaminophen Itching and Other (See Comments)    Has to take benadryl with to tolerate  . Sulfa Antibiotics Rash  . Sulfonamide Derivatives Rash        The results of significant diagnostics from this hospitalization (including imaging, microbiology, ancillary and laboratory) are listed below for reference.     Microbiology: Recent Results  (from the past 240 hour(s))  Urine culture     Status: Abnormal   Collection Time: 05/09/19 10:21 PM   Specimen: In/Out Cath Urine  Result Value Ref Range Status   Specimen Description   Final    IN/OUT CATH URINE Performed at [place] [address], [place], [place] [address]  Special Requests   Final    NONE Performed at [facility], [place] [address], [place], [place] [address]    Culture >=100,000 COLONIES/mL ENTEROCOCCUS FAECALIS (A)  Final   Report Status 05/12/2019 FINAL  Final   Organism ID, Bacteria ENTEROCOCCUS FAECALIS (A)  Final      Susceptibility   Enterococcus faecalis - MIC*    AMPICILLIN <=2 SENSITIVE Sensitive     LEVOFLOXACIN 1 SENSITIVE Sensitive     NITROFURANTOIN 32 SENSITIVE Sensitive     VANCOMYCIN 1 SENSITIVE Sensitive     * >=100,000 COLONIES/mL ENTEROCOCCUS FAECALIS  Culture, blood (Routine x 2)     Status: None (Preliminary result)   Collection Time: 05/09/19 10:22 PM   Specimen: BLOOD  Result Value Ref Range Status   Specimen Description   Final    BLOOD RIGHT ANTECUBITAL Performed at [facility] [address], [place], [place] [address]    Special Requests   Final    BOTTLES DRAWN AEROBIC AND ANAEROBIC Blood Culture results may not be optimal due to an excessive volume of blood received in culture bottles Performed at [facility] [address], [place], [place] [address]    Culture   Final    NO GROWTH 4 DAYS Performed at [facility], [place] [address], [place], [place] [address]    Report Status PENDING  Incomplete  Culture, blood (Routine x 2)     Status: None (Preliminary result)   Collection Time: 05/09/19 10:22 PM   Specimen: BLOOD  Result Value Ref Range Status   Specimen Description   Final    BLOOD BLOOD LEFT FOREARM Performed at [facility] [address] [place], [place] [address]    Special Requests   Final     BOTTLES DRAWN AEROBIC AND ANAEROBIC Blood Culture results may not be optimal due to an excessive volume of blood received in culture bottles Performed at [facility] [address] [place], [place] [address]    Culture   Final    NO GROWTH 4 DAYS Performed at [facility], [place] [address] [place],  [address]    Report Status PENDING  Incomplete  SARS Coronavirus 2 by RT PCR (hospital order, performed in [facility] hospital lab) Nasopharyngeal Nasopharyngeal Swab     Status: None   Collection Time: 05/09/19 10:48 PM   Specimen: Nasopharyngeal Swab  Result Value Ref Range Status   SARS Coronavirus 2 NEGATIVE NEGATIVE Final    Comment: (NOTE) If result is NEGATIVE SARS-CoV-2 target nucleic acids are NOT DETECTED. The SARS-CoV-2 RNA is generally detectable in upper and lower  respiratory specimens during the acute phase of infection. The lowest  concentration of SARS-CoV-2 viral copies this assay can detect is 250  copies / mL. A negative result does not preclude SARS-CoV-2 infection  and should not be used as the sole basis for treatment or other  patient management decisions.  A negative result may occur with  improper specimen collection / handling, submission of specimen other  than nasopharyngeal swab, presence of viral mutation(s) within the  areas targeted by this assay, and inadequate number of viral copies  (<250 copies / mL). A negative result must be combined with clinical  observations, patient history, and epidemiological information. If result is POSITIVE SARS-CoV-2 target nucleic acids are DETECTED. The SARS-CoV-2 RNA is generally detectable in upper and lower  respiratory specimens dur ing the acute phase of  infection.  Positive  results are indicative of active infection with SARS-CoV-2.  Clinical  correlation with patient history and other diagnostic information is  necessary to determine patient infection status.  Positive results  do  not rule out bacterial infection or co-infection with other viruses. If result is PRESUMPTIVE POSTIVE SARS-CoV-2 nucleic acids MAY BE PRESENT.   A presumptive positive result was obtained on the submitted specimen  and confirmed on repeat testing.  While 2019 novel coronavirus  (SARS-CoV-2) nucleic acids may be present in the submitted sample  additional confirmatory testing may be necessary for epidemiological  and / or clinical management purposes  to differentiate between  SARS-CoV-2 and other Sarbecovirus currently known to infect humans.  If clinically indicated additional testing with an alternate test  methodology [phone]) is advised. The SARS-CoV-2 RNA is generally  detectable in upper and lower respiratory sp ecimens during the acute  phase of infection. The expected result is Negative. Fact Sheet for Patients:  StrictlyIdeas.no Fact Sheet for Healthcare Providers: BankingDealers.co.za This test is not yet approved or cleared by the [place] FDA and has been authorized for detection and/or diagnosis of SARS-CoV-2 by FDA under an Emergency Use Authorization (EUA).  This EUA will remain in effect (meaning this test can be used) for the duration of the COVID-19 declaration under Section 564(b)(1) of the Act, 21 U.S.C. section 360bbb-3(b)(1), unless the authorization is terminated or revoked sooner. Performed at [facility], [place] [address], [place], [place] [address]      Labs: BNP (last 3 results) No results for input(s): BNP in the last 8760 hours. Basic Metabolic Panel: Recent Labs  Lab 05/09/19 2222 05/10/19 0519 05/11/19 0358  NA 135 137 136  K 3.2* 3.4* 3.4*  CL 103 104 106  CO2 24 25 21*  GLUCOSE 169* 151* 142*  BUN 16 14 14   CREATININE 0.76 0.58 0.59  CALCIUM 9.8 9.4 9.5  MG  --  2.2  --    Liver Function Tests: Recent Labs  Lab 05/09/19 2222  AST 35  ALT 31  ALKPHOS 59   BILITOT 0.6  PROT 6.1*  ALBUMIN 3.4*   No results for input(s): LIPASE, AMYLASE in the last 168 hours. No results for input(s): AMMONIA in the last 168 hours. CBC: Recent Labs  Lab 05/09/19 2222 05/10/19 0519 05/11/19 0358  WBC 13.9* 12.4* 12.6*  NEUTROABS 11.3*  --   --   HGB 8.8* 8.5* 8.6*  HCT 28.7* 27.4* 28.5*  MCV 91.7 91.3 94.1  PLT 246 237 250   Cardiac Enzymes: No results for input(s): CKTOTAL, CKMB, CKMBINDEX, TROPONINI in the last 168 hours. BNP: Invalid input(s): POCBNP CBG: No results for input(s): GLUCAP in the last 168 hours. D-Dimer No results for input(s): DDIMER in the last 72 hours. Hgb A1c No results for input(s): HGBA1C in the last 72 hours. Lipid Profile No results for input(s): CHOL, HDL, LDLCALC, TRIG, CHOLHDL, LDLDIRECT in the last 72 hours. Thyroid function studies No results for input(s): TSH, T4TOTAL, T3FREE, THYROIDAB in the last 72 hours.  Invalid input(s): FREET3 Anemia work up No results for input(s): VITAMINB12, FOLATE, FERRITIN, TIBC, IRON, RETICCTPCT in the last 72 hours. Urinalysis    Component Value Date/Time   COLORURINE YELLOW 05/09/2019 [place] 05/09/2019 2222   LABSPEC 1.015 05/09/2019 2222   PHURINE 6.0 05/09/2019 [place] 05/09/2019 [place] 05/09/2019 [place] 05/09/2019 [place] 5 (  A) 05/09/2019 2222   PROTEINUR NEGATIVE 05/09/2019 2222   UROBILINOGEN 1.0 04/17/2013 1151   NITRITE NEGATIVE 05/09/2019 2222   LEUKOCYTESUR NEGATIVE 05/09/2019 2222   Sepsis Labs Invalid input(s): PROCALCITONIN,  WBC,  LACTICIDVEN Microbiology Recent Results (from the past 240 hour(s))  Urine culture     Status: Abnormal   Collection Time: 05/09/19 10:21 PM   Specimen: In/Out Cath Urine  Result Value Ref Range Status   Specimen Description   Final    IN/OUT CATH URINE Performed at [facility], [place] [address], [place], [place] [address]     Special Requests   Final    NONE Performed at [facility], [place] [address] [place], [place] [address]    Culture >=100,000 COLONIES/mL ENTEROCOCCUS FAECALIS (A)  Final   Report Status 05/12/2019 FINAL  Final   Organism ID, Bacteria ENTEROCOCCUS FAECALIS (A)  Final      Susceptibility   Enterococcus faecalis - MIC*    AMPICILLIN <=2 SENSITIVE Sensitive     LEVOFLOXACIN 1 SENSITIVE Sensitive     NITROFURANTOIN 32 SENSITIVE Sensitive     VANCOMYCIN 1 SENSITIVE Sensitive     * >=100,000 COLONIES/mL ENTEROCOCCUS FAECALIS  Culture, blood (Routine x 2)     Status: None (Preliminary result)   Collection Time: 05/09/19 10:22 PM   Specimen: BLOOD  Result Value Ref Range Status   Specimen Description   Final    BLOOD RIGHT ANTECUBITAL Performed at [facility] [address], [place], [place] [address]    Special Requests   Final    BOTTLES DRAWN AEROBIC AND ANAEROBIC Blood Culture results may not be optimal due to an excessive volume of blood received in culture bottles Performed at [place] [address], [place], [place] [address]    Culture   Final    NO GROWTH 4 DAYS Performed at [facility], [place] [address], [place], [place] [address]    Report Status PENDING  Incomplete  Culture, blood (Routine x 2)     Status: None (Preliminary result)   Collection Time: 05/09/19 10:22 PM   Specimen: BLOOD  Result Value Ref Range Status   Specimen Description   Final    BLOOD BLOOD LEFT FOREARM Performed at [facility] [address] [place], [place] [address]    Special Requests   Final    BOTTLES DRAWN AEROBIC AND ANAEROBIC Blood Culture results may not be optimal due to an excessive volume of blood received in culture bottles Performed at [facility] [address], [place], [place] [address]    Culture   Final    NO GROWTH 4 DAYS Performed at [facility], [place] [address], [place], [place] [address]    Report Status PENDING  Incomplete  SARS Coronavirus 2 by RT PCR (hospital order, performed in [facility] hospital lab) Nasopharyngeal Nasopharyngeal Swab     Status: None   Collection Time: 05/09/19 10:48 PM   Specimen: Nasopharyngeal Swab  Result Value Ref Range Status   SARS Coronavirus 2 NEGATIVE NEGATIVE Final    Comment: (NOTE) If result is NEGATIVE SARS-CoV-2 target nucleic acids are NOT DETECTED. The SARS-CoV-2 RNA is generally detectable in upper and lower  respiratory specimens during the acute phase of infection. The lowest  concentration of SARS-CoV-2 viral copies this assay can detect is 250  copies / mL. A negative result does not preclude SARS-CoV-2 infection  and  should not be used as the sole basis for treatment or other  patient management decisions.  A negative result may occur with  improper specimen collection / handling, submission of specimen other  than nasopharyngeal swab, presence of viral mutation(s) within the  areas targeted by this assay, and inadequate number of viral copies  (<250 copies / mL). A negative result must be combined with clinical  observations, patient history, and epidemiological information. If result is POSITIVE SARS-CoV-2 target nucleic acids are DETECTED. The SARS-CoV-2 RNA is generally detectable in upper and lower  respiratory specimens dur ing the acute phase of infection.  Positive  results are indicative of active infection with SARS-CoV-2.  Clinical  correlation with patient history and other diagnostic information is  necessary to determine patient infection status.  Positive results do  not rule out bacterial infection or co-infection with other viruses. If result is PRESUMPTIVE POSTIVE SARS-CoV-2 nucleic acids MAY BE PRESENT.   A presumptive positive result was obtained on the submitted specimen  and confirmed on repeat testing.  While 2019 novel coronavirus  (SARS-CoV-2) nucleic acids  may be present in the submitted sample  additional confirmatory testing may be necessary for epidemiological  and / or clinical management purposes  to differentiate between  SARS-CoV-2 and other Sarbecovirus currently known to infect humans.  If clinically indicated additional testing with an alternate test  methodology [phone]) is advised. The SARS-CoV-2 RNA is generally  detectable in upper and lower respiratory sp ecimens during the acute  phase of infection. The expected result is Negative. Fact Sheet for Patients:  StrictlyIdeas.no Fact Sheet for Healthcare Providers: BankingDealers.co.za This test is not yet approved or cleared by the [place] FDA and has been authorized for detection and/or diagnosis of SARS-CoV-2 by FDA under an Emergency Use Authorization (EUA).  This EUA will remain in effect (meaning this test can be used) for the duration of the COVID-19 declaration under Section 564(b)(1) of the Act, 21 U.S.C. section 360bbb-3(b)(1), unless the authorization is terminated or revoked sooner. Performed at [facility], [place] [address] [place], [place] [address]      Time coordinating discharge: Over 30 minutes  SIGNED:   Desiree Hane, MD  [facility] 05/13/2019, 9:31 AM Pager   If 7PM-7AM, please contact night-coverage [email or web address] Password [email or web address]

## 2019-05-13 NOTE — TOC Transition Note (Signed)
Transition of Care [facility]) - CM/SW Discharge Note   Patient Details  Name: [name] MRN: [id number] Date of Birth: [birth year]  [facility] of [facility]) CM/SW Contact:  Joaquin Courts, RN Phone Number: 05/13/2019, 9:52 AM   Clinical Narrative:    Oxygen set up with [facility], portable tank to be delivered to bedside for dc home.     Final next level of care: Home/Self Care Barriers to Discharge: No Barriers Identified   Patient Goals and CMS Choice Patient states their goals for this hospitalization and ongoing recovery are:: to go home today CMS Medicare.gov Compare Post Acute Care list provided to:: Patient Choice offered to / list presented to : Patient  Discharge Placement                       Discharge Plan and Services   Discharge Planning Services: CM Consult Post Acute Care Choice: Durable Medical Equipment          DME Arranged: Oxygen DME Agency: [employer] [facility] Date DME Agency Contacted: 05/13/19 Time DME Agency Contacted: [phone] Representative spoke with at DME Agency: Caryl Pina            Social Determinants of Health ([place]) Interventions     Readmission Risk Interventions No flowsheet data found.

## 2019-05-14 DIAGNOSIS — J9621 Acute and chronic respiratory failure with hypoxia: Secondary | ICD-10-CM | POA: Clinically undetermined

## 2019-05-14 LAB — CULTURE, BLOOD (ROUTINE X 2)
Culture: NO GROWTH
Culture: NO GROWTH

## 2019-05-16 ENCOUNTER — Other Ambulatory Visit: Payer: Self-pay

## 2019-05-16 ENCOUNTER — Inpatient Hospital Stay (HOSPITAL_COMMUNITY)
Admission: EM | Admit: 2019-05-16 | Discharge: 2019-05-28 | DRG: 871 | Disposition: E | Payer: Medicare Other | Attending: Internal Medicine | Admitting: Internal Medicine

## 2019-05-16 ENCOUNTER — Emergency Department (HOSPITAL_COMMUNITY): Payer: Medicare Other

## 2019-05-16 ENCOUNTER — Telehealth: Payer: Self-pay | Admitting: Primary Care

## 2019-05-16 ENCOUNTER — Encounter (HOSPITAL_COMMUNITY): Payer: Self-pay | Admitting: Emergency Medicine

## 2019-05-16 DIAGNOSIS — A419 Sepsis, unspecified organism: Secondary | ICD-10-CM | POA: Diagnosis present

## 2019-05-16 DIAGNOSIS — R0902 Hypoxemia: Secondary | ICD-10-CM | POA: Diagnosis present

## 2019-05-16 DIAGNOSIS — Z79890 Hormone replacement therapy (postmenopausal): Secondary | ICD-10-CM

## 2019-05-16 DIAGNOSIS — Z9842 Cataract extraction status, left eye: Secondary | ICD-10-CM

## 2019-05-16 DIAGNOSIS — Z515 Encounter for palliative care: Secondary | ICD-10-CM | POA: Diagnosis not present

## 2019-05-16 DIAGNOSIS — Z8249 Family history of ischemic heart disease and other diseases of the circulatory system: Secondary | ICD-10-CM

## 2019-05-16 DIAGNOSIS — Z90710 Acquired absence of both cervix and uterus: Secondary | ICD-10-CM

## 2019-05-16 DIAGNOSIS — J8410 Pulmonary fibrosis, unspecified: Secondary | ICD-10-CM | POA: Diagnosis present

## 2019-05-16 DIAGNOSIS — Z808 Family history of malignant neoplasm of other organs or systems: Secondary | ICD-10-CM

## 2019-05-16 DIAGNOSIS — G8929 Other chronic pain: Secondary | ICD-10-CM | POA: Diagnosis present

## 2019-05-16 DIAGNOSIS — I10 Essential (primary) hypertension: Secondary | ICD-10-CM | POA: Diagnosis present

## 2019-05-16 DIAGNOSIS — E669 Obesity, unspecified: Secondary | ICD-10-CM | POA: Diagnosis present

## 2019-05-16 DIAGNOSIS — Z7951 Long term (current) use of inhaled steroids: Secondary | ICD-10-CM

## 2019-05-16 DIAGNOSIS — R739 Hyperglycemia, unspecified: Secondary | ICD-10-CM | POA: Diagnosis not present

## 2019-05-16 DIAGNOSIS — Z803 Family history of malignant neoplasm of breast: Secondary | ICD-10-CM

## 2019-05-16 DIAGNOSIS — Z7982 Long term (current) use of aspirin: Secondary | ICD-10-CM

## 2019-05-16 DIAGNOSIS — J189 Pneumonia, unspecified organism: Secondary | ICD-10-CM | POA: Diagnosis present

## 2019-05-16 DIAGNOSIS — D500 Iron deficiency anemia secondary to blood loss (chronic): Secondary | ICD-10-CM | POA: Diagnosis present

## 2019-05-16 DIAGNOSIS — Z885 Allergy status to narcotic agent status: Secondary | ICD-10-CM

## 2019-05-16 DIAGNOSIS — R9431 Abnormal electrocardiogram [ECG] [EKG]: Secondary | ICD-10-CM | POA: Diagnosis not present

## 2019-05-16 DIAGNOSIS — Z20828 Contact with and (suspected) exposure to other viral communicable diseases: Secondary | ICD-10-CM | POA: Diagnosis present

## 2019-05-16 DIAGNOSIS — G4733 Obstructive sleep apnea (adult) (pediatric): Secondary | ICD-10-CM | POA: Diagnosis present

## 2019-05-16 DIAGNOSIS — E785 Hyperlipidemia, unspecified: Secondary | ICD-10-CM | POA: Diagnosis present

## 2019-05-16 DIAGNOSIS — J9621 Acute and chronic respiratory failure with hypoxia: Secondary | ICD-10-CM | POA: Diagnosis present

## 2019-05-16 DIAGNOSIS — Z6833 Body mass index (BMI) 33.0-33.9, adult: Secondary | ICD-10-CM

## 2019-05-16 DIAGNOSIS — E871 Hypo-osmolality and hyponatremia: Secondary | ICD-10-CM | POA: Diagnosis not present

## 2019-05-16 DIAGNOSIS — K219 Gastro-esophageal reflux disease without esophagitis: Secondary | ICD-10-CM | POA: Diagnosis present

## 2019-05-16 DIAGNOSIS — Z8719 Personal history of other diseases of the digestive system: Secondary | ICD-10-CM

## 2019-05-16 DIAGNOSIS — T380X5A Adverse effect of glucocorticoids and synthetic analogues, initial encounter: Secondary | ICD-10-CM | POA: Diagnosis not present

## 2019-05-16 DIAGNOSIS — Z96651 Presence of right artificial knee joint: Secondary | ICD-10-CM | POA: Diagnosis present

## 2019-05-16 DIAGNOSIS — Z66 Do not resuscitate: Secondary | ICD-10-CM | POA: Diagnosis not present

## 2019-05-16 DIAGNOSIS — Y92230 Patient room in hospital as the place of occurrence of the external cause: Secondary | ICD-10-CM | POA: Diagnosis not present

## 2019-05-16 DIAGNOSIS — J45909 Unspecified asthma, uncomplicated: Secondary | ICD-10-CM | POA: Diagnosis present

## 2019-05-16 DIAGNOSIS — Z882 Allergy status to sulfonamides status: Secondary | ICD-10-CM

## 2019-05-16 DIAGNOSIS — K449 Diaphragmatic hernia without obstruction or gangrene: Secondary | ICD-10-CM | POA: Diagnosis present

## 2019-05-16 DIAGNOSIS — E039 Hypothyroidism, unspecified: Secondary | ICD-10-CM | POA: Diagnosis present

## 2019-05-16 DIAGNOSIS — Z86011 Personal history of benign neoplasm of the brain: Secondary | ICD-10-CM

## 2019-05-16 DIAGNOSIS — J849 Interstitial pulmonary disease, unspecified: Secondary | ICD-10-CM | POA: Diagnosis not present

## 2019-05-16 DIAGNOSIS — Z8701 Personal history of pneumonia (recurrent): Secondary | ICD-10-CM

## 2019-05-16 DIAGNOSIS — Z9981 Dependence on supplemental oxygen: Secondary | ICD-10-CM | POA: Diagnosis not present

## 2019-05-16 DIAGNOSIS — Z87442 Personal history of urinary calculi: Secondary | ICD-10-CM

## 2019-05-16 DIAGNOSIS — F329 Major depressive disorder, single episode, unspecified: Secondary | ICD-10-CM | POA: Diagnosis present

## 2019-05-16 DIAGNOSIS — D72829 Elevated white blood cell count, unspecified: Secondary | ICD-10-CM | POA: Diagnosis not present

## 2019-05-16 DIAGNOSIS — Z9841 Cataract extraction status, right eye: Secondary | ICD-10-CM

## 2019-05-16 DIAGNOSIS — F411 Generalized anxiety disorder: Secondary | ICD-10-CM | POA: Diagnosis present

## 2019-05-16 DIAGNOSIS — I083 Combined rheumatic disorders of mitral, aortic and tricuspid valves: Secondary | ICD-10-CM | POA: Diagnosis present

## 2019-05-16 DIAGNOSIS — J9601 Acute respiratory failure with hypoxia: Secondary | ICD-10-CM

## 2019-05-16 DIAGNOSIS — Z87891 Personal history of nicotine dependence: Secondary | ICD-10-CM

## 2019-05-16 DIAGNOSIS — Z86010 Personal history of colonic polyps: Secondary | ICD-10-CM

## 2019-05-16 DIAGNOSIS — Z800 Family history of malignant neoplasm of digestive organs: Secondary | ICD-10-CM

## 2019-05-16 DIAGNOSIS — I313 Pericardial effusion (noninflammatory): Secondary | ICD-10-CM | POA: Diagnosis present

## 2019-05-16 DIAGNOSIS — K52832 Lymphocytic colitis: Secondary | ICD-10-CM | POA: Diagnosis present

## 2019-05-16 DIAGNOSIS — Z9049 Acquired absence of other specified parts of digestive tract: Secondary | ICD-10-CM

## 2019-05-16 DIAGNOSIS — I4581 Long QT syndrome: Secondary | ICD-10-CM | POA: Diagnosis present

## 2019-05-16 DIAGNOSIS — Z79899 Other long term (current) drug therapy: Secondary | ICD-10-CM

## 2019-05-16 DIAGNOSIS — E876 Hypokalemia: Secondary | ICD-10-CM | POA: Diagnosis present

## 2019-05-16 DIAGNOSIS — R0602 Shortness of breath: Secondary | ICD-10-CM

## 2019-05-16 DIAGNOSIS — R0600 Dyspnea, unspecified: Secondary | ICD-10-CM | POA: Diagnosis not present

## 2019-05-16 DIAGNOSIS — Z801 Family history of malignant neoplasm of trachea, bronchus and lung: Secondary | ICD-10-CM

## 2019-05-16 LAB — CBC WITH DIFFERENTIAL/PLATELET
Abs Immature Granulocytes: 0.05 10*3/uL (ref 0.00–0.07)
Basophils Absolute: 0 10*3/uL (ref 0.0–0.1)
Basophils Relative: 0 %
Eosinophils Absolute: 0.3 10*3/uL (ref 0.0–0.5)
Eosinophils Relative: 2 %
HCT: 29.3 % — ABNORMAL LOW (ref 36.0–46.0)
Hemoglobin: 9 g/dL — ABNORMAL LOW (ref 12.0–15.0)
Immature Granulocytes: 0 %
Lymphocytes Relative: 10 %
Lymphs Abs: 1.5 10*3/uL (ref 0.7–4.0)
MCH: 28 pg (ref 26.0–34.0)
MCHC: 30.7 g/dL (ref 30.0–36.0)
MCV: 91.3 fL (ref 80.0–100.0)
Monocytes Absolute: 1.1 10*3/uL — ABNORMAL HIGH (ref 0.1–1.0)
Monocytes Relative: 7 %
Neutro Abs: 12.4 10*3/uL — ABNORMAL HIGH (ref 1.7–7.7)
Neutrophils Relative %: 81 %
Platelets: 358 10*3/uL (ref 150–400)
RBC: 3.21 MIL/uL — ABNORMAL LOW (ref 3.87–5.11)
RDW: 20.7 % — ABNORMAL HIGH (ref 11.5–15.5)
WBC: 15.4 10*3/uL — ABNORMAL HIGH (ref 4.0–10.5)
nRBC: 0 % (ref 0.0–0.2)

## 2019-05-16 LAB — BLOOD GAS, VENOUS
Acid-Base Excess: 2.6 mmol/L — ABNORMAL HIGH (ref 0.0–2.0)
Bicarbonate: 26.6 mmol/L (ref 20.0–28.0)
O2 Saturation: 36.2 %
Patient temperature: 98.6
pCO2, Ven: 40.9 mmHg — ABNORMAL LOW (ref 44.0–60.0)
pH, Ven: 7.429 (ref 7.250–7.430)

## 2019-05-16 LAB — COMPREHENSIVE METABOLIC PANEL
ALT: 58 U/L — ABNORMAL HIGH (ref 0–44)
AST: 34 U/L (ref 15–41)
Albumin: 2.8 g/dL — ABNORMAL LOW (ref 3.5–5.0)
Alkaline Phosphatase: 114 U/L (ref 38–126)
Anion gap: 9 (ref 5–15)
BUN: 17 mg/dL (ref 8–23)
CO2: 24 mmol/L (ref 22–32)
Calcium: 10 mg/dL (ref 8.9–10.3)
Chloride: 102 mmol/L (ref 98–111)
Creatinine, Ser: 0.63 mg/dL (ref 0.44–1.00)
GFR calc Af Amer: >60 mL/min (ref >60)
GFR calc non Af Amer: >60 mL/min (ref >60)
Glucose, Bld: 145 mg/dL — ABNORMAL HIGH (ref 70–99)
Potassium: 2.7 mmol/L — CL (ref 3.5–5.1)
Sodium: 135 mmol/L (ref 135–145)
Total Bilirubin: 0.5 mg/dL (ref 0.3–1.2)
Total Protein: 6.6 g/dL (ref 6.5–8.1)

## 2019-05-16 LAB — BASIC METABOLIC PANEL
Anion gap: 11 (ref 5–15)
BUN: 19 mg/dL (ref 8–23)
CO2: 23 mmol/L (ref 22–32)
Calcium: 10.2 mg/dL (ref 8.9–10.3)
Chloride: 101 mmol/L (ref 98–111)
Creatinine, Ser: 0.67 mg/dL (ref 0.44–1.00)
GFR calc Af Amer: >60 mL/min (ref >60)
GFR calc non Af Amer: >60 mL/min (ref >60)
Glucose, Bld: 125 mg/dL — ABNORMAL HIGH (ref 70–99)
Potassium: 2.9 mmol/L — ABNORMAL LOW (ref 3.5–5.1)
Sodium: 135 mmol/L (ref 135–145)

## 2019-05-16 LAB — PROTIME-INR
INR: 1.3 — ABNORMAL HIGH (ref 0.8–1.2)
Prothrombin Time: 15.9 seconds — ABNORMAL HIGH (ref 11.4–15.2)

## 2019-05-16 LAB — APTT: aPTT: 27 seconds (ref 24–36)

## 2019-05-16 LAB — LACTIC ACID, PLASMA: Lactic Acid, Venous: 1.3 mmol/L (ref 0.5–1.9)

## 2019-05-16 MED ORDER — POTASSIUM CHLORIDE CRYS ER 20 MEQ PO TBCR
40.0000 meq | EXTENDED_RELEASE_TABLET | Freq: Once | ORAL | Status: AC
  Administered 2019-05-16: 40 meq via ORAL
  Filled 2019-05-16: qty 2

## 2019-05-16 MED ORDER — SODIUM CHLORIDE 0.9 % IV BOLUS (SEPSIS): 1000.0000 mL | Freq: Once | INTRAVENOUS | Status: DC

## 2019-05-16 MED ORDER — ACETAMINOPHEN 650 MG RE SUPP: 650.0000 mg | Freq: Four times a day (QID) | RECTAL | Status: DC | PRN

## 2019-05-16 MED ORDER — SODIUM CHLORIDE 0.9 % IV SOLN
2.0000 g | Freq: Once | INTRAVENOUS | Status: AC
  Administered 2019-05-16: 2 g via INTRAVENOUS
  Filled 2019-05-16: qty 2

## 2019-05-16 MED ORDER — ALBUTEROL SULFATE HFA 108 (90 BASE) MCG/ACT IN AERS
2.0000 | INHALATION_SPRAY | RESPIRATORY_TRACT | Status: DC | PRN
  Filled 2019-05-16: qty 6.7

## 2019-05-16 MED ORDER — HYDROMORPHONE HCL 2 MG PO TABS: 2.0000 mg | ORAL_TABLET | ORAL | Status: DC | PRN

## 2019-05-16 MED ORDER — SODIUM CHLORIDE 0.9 % IV BOLUS (SEPSIS)
1000.0000 mL | Freq: Once | INTRAVENOUS | Status: AC
  Administered 2019-05-16: 1000 mL via INTRAVENOUS

## 2019-05-16 MED ORDER — SODIUM CHLORIDE 0.9 % IV SOLN
500.0000 mg | INTRAVENOUS | Status: DC
  Administered 2019-05-17: 500 mg via INTRAVENOUS
  Filled 2019-05-16: qty 500

## 2019-05-16 MED ORDER — ACETAMINOPHEN 325 MG PO TABS
650.0000 mg | ORAL_TABLET | Freq: Four times a day (QID) | ORAL | Status: DC | PRN
  Administered 2019-05-17 – 2019-05-19 (×4): 650 mg via ORAL
  Filled 2019-05-16 (×4): qty 2

## 2019-05-16 MED ORDER — DIPHENOXYLATE-ATROPINE 2.5-0.025 MG PO TABS: 1.0000 | ORAL_TABLET | Freq: Four times a day (QID) | ORAL | Status: DC | PRN

## 2019-05-16 MED ORDER — MAGNESIUM SULFATE IN D5W 1-5 GM/100ML-% IV SOLN
1.0000 g | Freq: Once | INTRAVENOUS | Status: AC
  Administered 2019-05-17: 1 g via INTRAVENOUS
  Filled 2019-05-16 (×2): qty 100

## 2019-05-16 MED ORDER — METHOCARBAMOL 500 MG PO TABS: 500.0000 mg | ORAL_TABLET | Freq: Four times a day (QID) | ORAL | Status: DC | PRN

## 2019-05-16 MED ORDER — ACETAMINOPHEN 325 MG PO TABS
650.0000 mg | ORAL_TABLET | Freq: Once | ORAL | Status: AC
  Administered 2019-05-16: 21:00:00 650 mg via ORAL
  Filled 2019-05-16: qty 2

## 2019-05-16 MED ORDER — POTASSIUM CHLORIDE 10 MEQ/100ML IV SOLN
10.0000 meq | INTRAVENOUS | Status: AC
  Administered 2019-05-16: 10 meq via INTRAVENOUS
  Filled 2019-05-16: qty 100

## 2019-05-16 MED ORDER — VANCOMYCIN HCL 10 G IV SOLR
2000.0000 mg | Freq: Once | INTRAVENOUS | Status: AC
  Administered 2019-05-16: 21:00:00 2000 mg via INTRAVENOUS
  Filled 2019-05-16: qty 2000

## 2019-05-16 MED ORDER — SODIUM CHLORIDE 0.9 % IV BOLUS (SEPSIS)
1000.0000 mL | Freq: Once | INTRAVENOUS | Status: AC
  Administered 2019-05-16: 21:00:00 1000 mL via INTRAVENOUS

## 2019-05-16 MED ORDER — SODIUM CHLORIDE 0.9 % IV SOLN
2.0000 g | INTRAVENOUS | Status: DC
  Administered 2019-05-16: 2 g via INTRAVENOUS
  Filled 2019-05-16 (×2): qty 20

## 2019-05-16 MED ORDER — VANCOMYCIN HCL IN DEXTROSE 1-5 GM/200ML-% IV SOLN: 1000.0000 mg | Freq: Once | INTRAVENOUS | Status: DC

## 2019-05-16 NOTE — ED Triage Notes (Signed)
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

## 2019-05-16 NOTE — ED Provider Notes (Signed)
[facility] DEPT Provider Note   CSN: [id number] Arrival date & time: 05/26/2019  1713     History   Chief Complaint Chief Complaint  Patient presents with   Shortness of Breath    HPI [name] is a 73 y.o. female with h/o pulmonary fibrosis, on CPAP, recent right total knee replacement 10/6, HTN, HLD, hypothyroidism, recent hospitalization for pneumonia and hypoxemia here for evaluation of low oxygen levels at home.  Patient was discharged from the hospital on Saturday.  Since then has had intermittent labored breathing especially when moving around and talking however woke up this morning and felt worse.  They just received their pulse oximeter this afternoon and when they placed it on her finger they noticed her oxygen was lower than 85% on her 1 L [place] that was recommended to her at discharge.  Oxygen level dropped even lower than 85% when she would get up and move.  Even on 3 L [place] her oxygen was still not greater than 87%.  EMS was called and husband was told that may be the 41 m long tubing for the nasal cannula was not allowing oxygenation to reach the patient so they had him switch to the CPAP tubing.  This did not help.  Patient has a mild chronic nonproductive cough but this morning has felt like she is coughed more frequently but still without any sputum.  Called pulmonology office and was told to come to the ER.  She finished Levaquin in the hospital.  She has appointment with pulmonology 11/10 for further work-up of interstitial lung disease/fibrosis.  She denies fevers but states the last couple of nights has been very sweaty during her sleep and having chills.  No sore throat, congestion, vomiting, diarrhea, abdominal pain, dysuria.  No sick contacts at home.  No chest pain.  No lower extremity swelling or calf pain. HPI  Past Medical History:  Diagnosis Date   Anxiety state, unspecified    Benign neoplasm of colon    Brain tumor ([place])     Cancer ([place])    Colon polyps    hyperplastic   Complication of anesthesia    requests dilaudid for pain   Complications affecting other specified body systems, hypertension    No cardiologist    Contact lens/glasses fitting    wears glassses or contacts   Depressive disorder, not elsewhere classified    Dyspnea    due to fibrosis   Esophageal reflux    Esophageal ring    Family history of colon cancer mother   Fundic gland polyps of stomach, benign    Hemorrhoids    Hiatal hernia    History of kidney stones    Hypertension    Hypothyroidism    Lymphocytic colitis    Obesity    Other and unspecified hyperlipidemia    Pneumonia 02/28/2019   Pulmonary fibrosis ([place])    Seizures ([place])    x 1    Patient Active Problem List   Diagnosis Date Noted   Acute respiratory failure with hypoxia ([place]) 05/14/2019   Lymphocytic colitis 05/11/2019   Essential hypertension 05/11/2019   Iron deficiency anemia due to chronic blood loss 05/11/2019   Hypothyroidism 05/11/2019   CAP (community acquired pneumonia) 05/10/2019   Pneumonia 05/09/2019   Obese 05/03/2019   S/P right TKA 05/02/2019   Asthma 02/17/2019   Flu-like symptoms 09/26/2018   ILD (interstitial lung disease) ([place]) 09/26/2018   Tinnitus, bilateral 10/20/2016   Excessive  cerumen in both ear canals 10/20/2016   Benign essential tremor 06/09/2016   Benign paroxysmal positional vertigo 06/09/2016   History of benign brain tumor 06/09/2016   Gait disturbance 06/09/2016   Trimalleolar fracture of ankle, closed 04/03/2015   Lumbar radiculopathy 04/19/2013   DIARRHEA 09/15/2010   PERSONAL HISTORY OF FAILED MODERATE SEDATION 09/15/2010   DIARRHEA-PRESUMED INFECTIOUS 07/24/2010   HEMORRHOIDS-INTERNAL 12/10/2008   HEMORRHOIDS-EXTERNAL 12/10/2008   PERSONAL HX COLONIC POLYPS 12/10/2008   COLONIC POLYPS, HYPERPLASTIC 11/22/2008   HYPERLIPIDEMIA 11/22/2008   ANXIETY 11/22/2008     DEPRESSION 11/22/2008   ABNORMAL HEART RHYTHMS 11/22/2008   HEMORRHOIDS 11/22/2008   GERD 11/22/2008   Diaphragmatic hernia 11/22/2008   HYPERTENSION NEC 11/22/2008    Past Surgical History:  Procedure Laterality Date   ABDOMINAL HYSTERECTOMY     APPENDECTOMY     BACK SURGERY  2014   rod in lower back   BRAIN SURGERY  1999   benign tumor   CARPAL TUNNEL RELEASE     right   CATARACT EXTRACTION, BILATERAL  04/2018   CRANIOTOMY     EYE SURGERY     HAMMERTOE RECONSTRUCTION WITH WEIL OSTEOTOMY  07/07/2012   Procedure: HAMMERTOE RECONSTRUCTION WITH WEIL OSTEOTOMY;  Surgeon: Wylene Simmer, MD;  Location: [facility];  Service: Orthopedics;  Laterality: Left;  Left Second through [place]; Left Fifth Toe Flexor Tendon Release, and Left Second through [place] Osteotomy   ORIF ANKLE FRACTURE Left 04/04/2015   Procedure: OPEN REDUCTION INTERNAL FIXATION (ORIF) LEFT ANKLE FRACTURE;  Surgeon: Wylene Simmer, MD;  Location: [facility];  Service: Orthopedics;  Laterality: Left;   TOTAL KNEE ARTHROPLASTY Right 05/02/2019   Procedure: TOTAL KNEE ARTHROPLASTY;  Surgeon: Paralee Cancel, MD;  Location: [facility];  Service: Orthopedics;  Laterality: Right;  70 mins     OB History   No obstetric history on file.      Home Medications    Prior to Admission medications   Medication Sig Start Date End Date Taking? Authorizing Provider  acetaminophen (TYLENOL) 500 MG tablet Take 2 tablets (1,000 mg total) by mouth every 8 (eight) hours. 05/03/19  Yes Babish, Rodman Key, PA-C  Ascorbic Acid (VITAMIN C) 1000 MG tablet Take 1,000 mg by mouth daily.     Yes [provider]  aspirin (ASPIRIN CHILDRENS) 81 MG chewable tablet Chew 1 tablet (81 mg total) by mouth 2 (two) times daily. Take for 4 weeks, then resume regular dose. 05/04/19 06/03/19 Yes Babish, Rodman Key, PA-C  budesonide (ENTOCORT EC) 3 MG 24 hr capsule Take 3 capsules (9 mg total) by mouth daily. Take 3,   23m tabs in morning as needed Patient taking differently: Take 6 mg by mouth daily.  03/27/19  Yes PIrene Shipper MD  budesonide-formoterol ([facility] 160-4.5 MCG/ACT inhaler Inhale 2 puffs into the lungs 2 (two) times daily. 02/17/19  Yes WMartyn Ehrich NP  Calcium Carbonate-Vitamin D 600-400 MG-UNIT tablet Take 1 tablet by mouth daily.   Yes [provider]  cetirizine (ZYRTEC) 10 MG tablet Take 10 mg by mouth daily.   Yes [provider]  [employer] HYDROCORTISONE ACETATE 0.5 % cream APPLY TO AFFECTED AREA TOPICALLY AS NEEDED FOR HEMORRHOIDS Patient taking differently: Apply 1 application topically daily as needed (for hemorrhoids).  02/10/16  Yes PIrene Shipper MD  diphenoxylate-atropine (LOMOTIL) 2.5-0.025 MG tablet Take 1 tab by mouth as needed. Patient taking differently: Take 1-2 tablets by mouth 4 (four) times daily as needed for diarrhea or  loose stools.  03/27/19  Yes Irene Shipper, MD  docusate sodium (COLACE) 100 MG capsule Take 1 capsule (100 mg total) by mouth 2 (two) times daily. 05/03/19  Yes Babish, Rodman Key, PA-C  famotidine (PEPCID) 20 MG tablet TAKE 1 TABLET BY MOUTH EVERYDAY AT BEDTIME 05/15/19  Yes Irene Shipper, MD  hydrochlorothiazide (HYDRODIURIL) 25 MG tablet Take 25 mg by mouth daily. 10/03/18  Yes [provider]  HYDROmorphone (DILAUDID) 2 MG tablet Take 1-2 tablets (2-4 mg total) by mouth every 4 (four) hours as needed for severe pain. 05/03/19  Yes Babish, Rodman Key, PA-C  Iron, Ferrous Sulfate, 325 (65 Fe) MG TABS Take 1 tablet by mouth 2 (two) times daily. 03/27/19  Yes Irene Shipper, MD  levofloxacin (LEVAQUIN) 750 MG tablet Take 1 tablet (750 mg total) by mouth at bedtime for 3 days. 05/13/19 05/27/2019 Yes Oretha Milch D, MD  levothyroxine (SYNTHROID) 125 MCG tablet Take 125 mcg by mouth daily. 12/08/18  Yes [provider]  Melatonin 5 MG TABS Take 7.5 mg by mouth at bedtime as needed (sleep).    Yes [provider]  methocarbamol  (ROBAXIN) 500 MG tablet Take 1 tablet (500 mg total) by mouth every 6 (six) hours as needed for muscle spasms. 05/03/19  Yes Babish, Rodman Key, PA-C  Omega-3 Fatty Acids (FISH OIL PO) Take 1,400 mg by mouth at bedtime.    Yes [provider]  pantoprazole (PROTONIX) 40 MG tablet TAKE 1 TABLET BY MOUTH EVERY DAY 05/15/19  Yes Irene Shipper, MD  Polyethyl Glycol-Propyl Glycol (LUBRICANT EYE DROPS) 0.4-0.3 % SOLN Place 1 drop into both eyes 3 (three) times daily as needed (dry/irritated eyes.).   Yes [provider]  pravastatin (PRAVACHOL) 20 MG tablet Take 20 mg by mouth daily. 03/25/19  Yes [provider]  sertraline (ZOLOFT) 100 MG tablet Take 150 mg by mouth daily. 11/29/18  Yes [provider]  polyethylene glycol (MIRALAX / GLYCOLAX) 17 g packet Take 17 g by mouth 2 (two) times daily. Patient not taking: Reported on 05/04/2019 05/03/19   Danae Orleans, PA-C  Respiratory Therapy Supplies (FLUTTER) DEVI 1 Device by Does not apply route as directed. 09/26/18   Martyn Ehrich, NP  Spacer/Aero-Holding Josiah Lobo DEVI 1 Device by Does not apply route as directed. 10/14/18   Brand Males, MD    Family History Family History  Problem Relation Age of Onset   Breast cancer Mother    Colon cancer Mother    Lung cancer Maternal Uncle    Bone cancer Maternal Grandmother    Heart attack Father    Stomach cancer Neg Hx    Pancreatic cancer Neg Hx    Esophageal cancer Neg Hx     Social History Social History   Tobacco Use   Smoking status: Former Smoker    Packs/day: 0.50    Years: 20.00    Pack years: 10.00    Quit date: 09/02/2010    Years since quitting: 8.7   Smokeless tobacco: Never Used  Substance Use Topics   Alcohol use: Yes    Alcohol/week: 7.0 standard drinks    Types: 7 Standard drinks or equivalent per week    Comment: intermittently 1 martini daily on most days   Drug use: No     Allergies   Shellfish allergy, Codeine,  Hydrocod polst-cpm polst er, Hydrocodone-acetaminophen, Sulfa antibiotics, and Sulfonamide derivatives   Review of Systems Review of Systems  Constitutional: Positive for appetite change, chills and  fatigue.  Respiratory: Positive for cough and shortness of breath.   Genitourinary: Positive for difficulty urinating.  All other systems reviewed and are negative.    Physical Exam Updated Vital Signs BP 133/61    Pulse 73    Temp (!) 100.8 F (38.2 C) (Rectal)    Resp 18    SpO2 95%   Physical Exam Vitals signs and nursing note reviewed.  Constitutional:      Appearance: She is well-developed.     Comments: Non toxic but looks tired   HENT:     Head: Normocephalic and atraumatic.     Right Ear: External ear normal.     Left Ear: External ear normal.     Nose: Nose normal.  Eyes:     General: No scleral icterus.    Conjunctiva/sclera: Conjunctivae normal.  Neck:     Musculoskeletal: Normal range of motion and neck supple.  Cardiovascular:     Rate and Rhythm: Normal rate and regular rhythm.     Heart sounds: Murmur present.     Comments: 1+ radial and DP pulses bilaterally.  No lower extremity edema.  No calf tenderness.  Systolic murmur. Pulmonary:     Effort: Tachypnea and respiratory distress present.     Breath sounds: Normal breath sounds. No wheezing.     Comments: Patient is on 1 LNC when I enter the room and SPO2 is 89%.  She is tachypneic.  Speaking in 2-3 word sentences during conversation.  She is to take a break to catch her breath.  Supplemental oxygen increased to 3 LNC and SPO2 has remained greater than 92% with improvement in work of breathing.  No wheezing but diminished air sounds in middle and lower lobes.  No crackles. Abdominal:     Palpations: Abdomen is soft.     Tenderness: There is no abdominal tenderness.  Musculoskeletal: Normal range of motion.        General: No deformity.  Skin:    General: Skin is warm and dry.     Capillary Refill: Capillary  refill takes less than 2 seconds.  Neurological:     Mental Status: She is alert and oriented to person, place, and time.  Psychiatric:        Behavior: Behavior normal.        Thought Content: Thought content normal.        Judgment: Judgment normal.      ED Treatments / Results  Labs (all labs ordered are listed, but only abnormal results are displayed) Labs Reviewed  CBC WITH DIFFERENTIAL/PLATELET - Abnormal; Notable for the following components:      Result Value   WBC 15.4 (*)    RBC 3.21 (*)    Hemoglobin 9.0 (*)    HCT 29.3 (*)    RDW 20.7 (*)    Neutro Abs 12.4 (*)    Monocytes Absolute 1.1 (*)    All other components within normal limits  BASIC METABOLIC PANEL - Abnormal; Notable for the following components:   Potassium 2.9 (*)    Glucose, Bld 125 (*)    All other components within normal limits  URINE CULTURE  CULTURE, BLOOD (ROUTINE X 2)  CULTURE, BLOOD (ROUTINE X 2)  SARS CORONAVIRUS 2 (TAT 6-24 HRS)  LACTIC ACID, PLASMA  URINALYSIS, ROUTINE W REFLEX MICROSCOPIC  LACTIC ACID, PLASMA  COMPREHENSIVE METABOLIC PANEL  APTT  PROTIME-INR  BLOOD GAS, VENOUS    EKG EKG Interpretation  Date/Time:  Tuesday May 16 2019  17:55:38 EDT Ventricular Rate:  77 PR Interval:    QRS Duration: 111 QT Interval:  455 QTC Calculation: 515 R Axis:   -17 Text Interpretation:  Sinus rhythm Borderline left axis deviation Borderline repolarization abnormality Prolonged QT interval Since last tracing QT has shortened Confirmed by Daleen Bo [phone]) on 04/28/2019 6:04:35 PM   Radiology Dg Chest 2 View  Result Date: 05/24/2019 CLINICAL DATA:  Shortness of breath. Pulmonary fibrosis. EXAM: CHEST - 2 VIEW COMPARISON:  Radiographs dated 05/09/2019 and CT scan dated 05/10/2019 FINDINGS: There are extensive bilateral peripheral hazy pulmonary infiltrates consistent with pneumonia, markedly progressed since the prior chest x-ray and chest CT. No effusions. Heart size and  pulmonary vascularity are normal. No bone abnormality. IMPRESSION: Extensive bilateral peripheral hazy pulmonary infiltrates consistent with pneumonia, markedly progressed since the prior chest x-ray and chest CT scan. Electronically Signed   By: Lorriane Shire M.D.   On: 05/12/2019 18:17    Procedures .Critical Care Performed by: Kinnie Feil, PA-C Authorized by: Kinnie Feil, PA-C   Critical care provider statement:    Critical care time (minutes):  45   Critical care was necessary to treat or prevent imminent or life-threatening deterioration of the following conditions:  Respiratory failure and sepsis   Critical care was time spent personally by me on the following activities:  Discussions with consultants, evaluation of patient's response to treatment, examination of patient, ordering and performing treatments and interventions, ordering and review of laboratory studies, ordering and review of radiographic studies, pulse oximetry, re-evaluation of patient's condition, obtaining history from patient or surrogate, review of old charts and development of treatment plan with patient or surrogate   I assumed direction of critical care for this patient from another provider in my specialty: no     (including critical care time)  Medications Ordered in ED Medications  ceFEPIme (MAXIPIME) 2 g in sodium chloride 0.9 % 100 mL IVPB (2 g Intravenous New Bag/Given 05/17/2019 2048)  sodium chloride 0.9 % bolus 1,000 mL (1,000 mLs Intravenous New Bag/Given 04/30/2019 2049)    And  sodium chloride 0.9 % bolus 1,000 mL (has no administration in time range)    And  sodium chloride 0.9 % bolus 1,000 mL (1,000 mLs Intravenous New Bag/Given 05/17/2019 2045)  vancomycin (VANCOCIN) 2,000 mg in sodium chloride 0.9 % 500 mL IVPB (2,000 mg Intravenous New Bag/Given 05/08/2019 2054)  acetaminophen (TYLENOL) tablet 650 mg (650 mg Oral Given 05/22/2019 2053)     Initial Impression / Assessment and Plan / ED  Course  I have reviewed the triage vital signs and the nursing notes.  Pertinent labs & imaging results that were available during my care of the patient were reviewed by me and considered in my medical decision making (see chart for details).  Clinical Course as of May 15 2222  Tue May 16, 2019  1820 Extensive bilateral peripheral hazy pulmonary infiltrates consistent with pneumonia, markedly progressed since the prior chest x-ray and chest CT scan.  DG Chest 2 View [CG]  1858 Temp(!): 100.8 F (38.2 C) [CG]  2113 Potassium(!): 2.9 [CG]  2113 WBC(!): 15.4 [CG]  2113 Lactic Acid, Venous: 1.3 [CG]  2113 Hemoglobin(!): 9.0 [CG]  2223 Potassium(!!): 2.7 [CG]    Clinical Course User Index [CG] Kinnie Feil, PA-C   EMR reviewed.   Highest on ddx is infectious process.  No CP and doubt PE. No signs of hypervolemia or h/o HF and decompensated HF unlikely. Recent hospital admission could have  exposed to [place].   ER work up reviewed remarkable for hypokalemia, baseline anemia, leukocytosis.   SIRS criteria met with fever, hypoxia, leukocytosis.  CXR confirms worsening bilateral pneumonia. Sepsis code activated.   Tylenol, abx and IVF ordered per protocol.  No hypotension or lactic acidosis. Creatinine normal. No signs of severe sepsis or septic shock.   Re-evaluated patient and stable on 3 L [place] without clinical decline.   Will admit for hypoxia, sepsis.  Shared with EDP. VBG, COVID pending.   Final Clinical Impressions(s) / ED Diagnoses   Final diagnoses:  Hypoxemia    ED Discharge Orders    None       Arlean Hopping 05/05/2019 2224    Daleen Bo, MD 05/17/19 1140

## 2019-05-16 NOTE — H&P (Signed)
History and Physical    [name] [id number] DOB: September 30, 1945 DOA: 04/30/2019  PCP: Robyne Peers, MD   Patient coming from: Home   Chief Complaint: Chills, increased cough and SOB, increased O2 requirements   HPI: [name] is a 73 y.o. female with medical history significant for interstitial lung disease, hypothyroidism, lymphocytic colitis, hypertension, and recent admission with pneumonia, discharged on 1 L of supplemental oxygen, now returning to the ED with chills, worsened dyspnea and cough, and increased supplemental oxygen requirement.  Patient reports that she was feeling much improved when she left the hospital on 05/13/2019 and continued to do well back at home until last night when she developed shaking chills.  She also has increase in her shortness of breath and nonproductive cough over the past 24 hours.  She denies any chest pain, leg swelling, or calf tenderness.  No one with fever or respiratory symptoms at home.  She denies abdominal pain, vomiting, or diarrhea since the recent admission.  ED Course: Upon arrival to the ED, patient is found to be febrile to 38.2 C, requiring 3 L/min of supplemental oxygen to maintain saturations in the low 90s, mildly tachypneic, and with stable blood pressure.  EKG features a sinus rhythm with a QTc interval of 550 ms.  Chest x-ray is concerning for extensive bilateral peripheral hazy infiltrates consistent with markedly progressed pneumonia.  Chemistry panel is notable for potassium of 2.9.  CBC features a leukocytosis to 15,400 and a stable normocytic anemia.  Blood cultures were collected, 3 L normal saline administered, and the patient was treated with acetaminophen, cefepime, and vancomycin.  COVID-19 testing has been ordered but not yet performed.  Review of Systems:  All other systems reviewed and apart from HPI, are negative.  Past Medical History:  Diagnosis Date  . Anxiety state, unspecified   . Benign neoplasm of  colon   . Brain tumor ([place])   . Cancer ([place])   . Colon polyps    hyperplastic  . Complication of anesthesia    requests dilaudid for pain  . Complications affecting other specified body systems, hypertension    No cardiologist   . Contact lens/glasses fitting    wears glassses or contacts  . Depressive disorder, not elsewhere classified   . Dyspnea    due to fibrosis  . Esophageal reflux   . Esophageal ring   . Family history of colon cancer mother  . Fundic gland polyps of stomach, benign   . Hemorrhoids   . Hiatal hernia   . History of kidney stones   . Hypertension   . Hypothyroidism   . Lymphocytic colitis   . Obesity   . Other and unspecified hyperlipidemia   . Pneumonia 02/28/2019  . Pulmonary fibrosis ([place])   . Seizures ([place])    x 1    Past Surgical History:  Procedure Laterality Date  . ABDOMINAL HYSTERECTOMY    . APPENDECTOMY    . BACK SURGERY  2014   rod in lower back  . BRAIN SURGERY  1999   benign tumor  . CARPAL TUNNEL RELEASE     right  . CATARACT EXTRACTION, BILATERAL  04/2018  . CRANIOTOMY    . EYE SURGERY    . HAMMERTOE RECONSTRUCTION WITH WEIL OSTEOTOMY  07/07/2012   Procedure: HAMMERTOE RECONSTRUCTION WITH WEIL OSTEOTOMY;  Surgeon: Wylene Simmer, MD;  Location: [facility];  Service: Orthopedics;  Laterality: Left;  Left Second through [place]; Left Fifth  Toe Flexor Tendon Release, and Left Second through [place] Osteotomy  . ORIF ANKLE FRACTURE Left 04/04/2015   Procedure: OPEN REDUCTION INTERNAL FIXATION (ORIF) LEFT ANKLE FRACTURE;  Surgeon: Wylene Simmer, MD;  Location: [facility];  Service: Orthopedics;  Laterality: Left;  . TOTAL KNEE ARTHROPLASTY Right 05/02/2019   Procedure: TOTAL KNEE ARTHROPLASTY;  Surgeon: Paralee Cancel, MD;  Location: [facility];  Service: Orthopedics;  Laterality: Right;  70 mins     reports that she quit smoking about 8 years ago. She has a 10.00 pack-year smoking history. She has never used  smokeless tobacco. She reports current alcohol use of about 7.0 standard drinks of alcohol per week. She reports that she does not use drugs.  Allergies  Allergen Reactions  . Shellfish Allergy Anaphylaxis    Lip swelling  . Codeine Other (See Comments)    nightmares  . Hydrocod Polst-Cpm Polst Er Itching  . Hydrocodone-Acetaminophen Itching and Other (See Comments)    Has to take benadryl with to tolerate  . Sulfa Antibiotics Rash  . Sulfonamide Derivatives Rash    Family History  Problem Relation Age of Onset  . Breast cancer Mother   . Colon cancer Mother   . Lung cancer Maternal Uncle   . Bone cancer Maternal Grandmother   . Heart attack Father   . Stomach cancer Neg Hx   . Pancreatic cancer Neg Hx   . Esophageal cancer Neg Hx      Prior to Admission medications   Medication Sig Start Date End Date Taking? Authorizing Provider  acetaminophen (TYLENOL) 500 MG tablet Take 2 tablets (1,000 mg total) by mouth every 8 (eight) hours. 05/03/19  Yes Babish, Rodman Key, PA-C  Ascorbic Acid (VITAMIN C) 1000 MG tablet Take 1,000 mg by mouth daily.     Yes [provider]  aspirin (ASPIRIN CHILDRENS) 81 MG chewable tablet Chew 1 tablet (81 mg total) by mouth 2 (two) times daily. Take for 4 weeks, then resume regular dose. 05/04/19 06/03/19 Yes Babish, Rodman Key, PA-C  budesonide (ENTOCORT EC) 3 MG 24 hr capsule Take 3 capsules (9 mg total) by mouth daily. Take 3,  3mg  tabs in morning as needed Patient taking differently: Take 6 mg by mouth daily.  03/27/19  Yes Irene Shipper, MD  budesonide-formoterol [facility]) 160-4.5 MCG/ACT inhaler Inhale 2 puffs into the lungs 2 (two) times daily. 02/17/19  Yes Martyn Ehrich, NP  Calcium Carbonate-Vitamin D 600-400 MG-UNIT tablet Take 1 tablet by mouth daily.   Yes [provider]  cetirizine (ZYRTEC) 10 MG tablet Take 10 mg by mouth daily.   Yes [provider]  [employer] HYDROCORTISONE ACETATE 0.5 % cream APPLY TO AFFECTED AREA  TOPICALLY AS NEEDED FOR HEMORRHOIDS Patient taking differently: Apply 1 application topically daily as needed (for hemorrhoids).  02/10/16  Yes Irene Shipper, MD  diphenoxylate-atropine (LOMOTIL) 2.5-0.025 MG tablet Take 1 tab by mouth as needed. Patient taking differently: Take 1-2 tablets by mouth 4 (four) times daily as needed for diarrhea or loose stools.  03/27/19  Yes Irene Shipper, MD  docusate sodium (COLACE) 100 MG capsule Take 1 capsule (100 mg total) by mouth 2 (two) times daily. 05/03/19  Yes Babish, Rodman Key, PA-C  famotidine (PEPCID) 20 MG tablet TAKE 1 TABLET BY MOUTH EVERYDAY AT BEDTIME 05/15/19  Yes Irene Shipper, MD  hydrochlorothiazide (HYDRODIURIL) 25 MG tablet Take 25 mg by mouth daily. 10/03/18  Yes [provider]  HYDROmorphone (DILAUDID) 2 MG tablet Take 1-2  tablets (2-4 mg total) by mouth every 4 (four) hours as needed for severe pain. 05/03/19  Yes Babish, Rodman Key, PA-C  Iron, Ferrous Sulfate, 325 (65 Fe) MG TABS Take 1 tablet by mouth 2 (two) times daily. 03/27/19  Yes Irene Shipper, MD  levofloxacin (LEVAQUIN) 750 MG tablet Take 1 tablet (750 mg total) by mouth at bedtime for 3 days. 05/13/19 05/27/2019 Yes Oretha Milch D, MD  levothyroxine (SYNTHROID) 125 MCG tablet Take 125 mcg by mouth daily. 12/08/18  Yes [provider]  Melatonin 5 MG TABS Take 7.5 mg by mouth at bedtime as needed (sleep).    Yes [provider]  methocarbamol (ROBAXIN) 500 MG tablet Take 1 tablet (500 mg total) by mouth every 6 (six) hours as needed for muscle spasms. 05/03/19  Yes Babish, Rodman Key, PA-C  Omega-3 Fatty Acids (FISH OIL PO) Take 1,400 mg by mouth at bedtime.    Yes [provider]  pantoprazole (PROTONIX) 40 MG tablet TAKE 1 TABLET BY MOUTH EVERY DAY 05/15/19  Yes Irene Shipper, MD  Polyethyl Glycol-Propyl Glycol (LUBRICANT EYE DROPS) 0.4-0.3 % SOLN Place 1 drop into both eyes 3 (three) times daily as needed (dry/irritated eyes.).   Yes [provider]  pravastatin (PRAVACHOL) 20 MG tablet Take 20 mg by mouth daily. 03/25/19  Yes [provider]  sertraline (ZOLOFT) 100 MG tablet Take 150 mg by mouth daily. 11/29/18  Yes [provider]  polyethylene glycol (MIRALAX / GLYCOLAX) 17 g packet Take 17 g by mouth 2 (two) times daily. Patient not taking: Reported on 05/18/2019 05/03/19   Danae Orleans, PA-C  Respiratory Therapy Supplies (FLUTTER) DEVI 1 Device by Does not apply route as directed. 09/26/18   Martyn Ehrich, NP  Spacer/Aero-Holding Josiah Lobo DEVI 1 Device by Does not apply route as directed. 10/14/18   Brand Males, MD    Physical Exam: Vitals:   05/20/2019 1815 05/22/2019 1830 05/02/2019 1852 05/22/2019 1900  BP: (!) 145/66 (!) 147/68  133/61  Pulse:  73    Resp: (!) 23 18    Temp:   (!) 100.8 F (38.2 C)   TempSrc:   Rectal   SpO2:  95%      Constitutional: NAD, calm  Eyes: PERTLA, lids and conjunctivae normal ENMT: Mucous membranes are moist. Posterior pharynx clear of any exudate or lesions.   Neck: normal, supple, no masses, no thyromegaly Respiratory: Mild tachypnea, scattered rhonchi. No pallor. No accessory muscle use.  Cardiovascular: S1 & S2 heard, regular rate and rhythm. No extremity edema.   Abdomen: No distension, no tenderness, soft. Bowel sounds active.  Musculoskeletal: no clubbing / cyanosis. No joint deformity upper and lower extremities.  Skin: no significant rashes, lesions, ulcers. Warm, dry, well-perfused. Neurologic: No facial asymmetry. Sensation intact. Moving all extremities.  Psychiatric: Alert and oriented x 3. Very pleasant, cooperative.    Labs on Admission: I have personally reviewed following labs and imaging studies  CBC: Recent Labs  Lab 05/09/19 2222 05/10/19 0519 05/11/19 0358 05/02/2019 1855  WBC 13.9* 12.4* 12.6* 15.4*  NEUTROABS 11.3*  --   --  12.4*  HGB 8.8* 8.5* 8.6* 9.0*  HCT 28.7* 27.4* 28.5* 29.3*  MCV 91.7 91.3 94.1 91.3  PLT 246 237 250 [id number]    Basic Metabolic Panel: Recent Labs  Lab 05/09/19 2222 05/10/19 0519 05/11/19 0358 05/11/2019 1855  NA 135 137 136 135  K 3.2* 3.4* 3.4* 2.9*  CL 103 104 106 101  CO2 24 25  21* 23  GLUCOSE 169* 151* 142* 125*  BUN 16 14 14 19   CREATININE 0.76 0.58 0.59 0.67  CALCIUM 9.8 9.4 9.5 10.2  MG  --  2.2  --   --    GFR: Estimated Creatinine Clearance: 67.3 mL/min (by C-G formula based on SCr of 0.67 mg/dL). Liver Function Tests: Recent Labs  Lab 05/09/19 2222  AST 35  ALT 31  ALKPHOS 59  BILITOT 0.6  PROT 6.1*  ALBUMIN 3.4*   No results for input(s): LIPASE, AMYLASE in the last 168 hours. No results for input(s): AMMONIA in the last 168 hours. Coagulation Profile: Recent Labs  Lab 05/09/19 2222 05/10/2019 2100  INR 1.0 1.3*   Cardiac Enzymes: No results for input(s): CKTOTAL, CKMB, CKMBINDEX, TROPONINI in the last 168 hours. BNP (last 3 results) No results for input(s): PROBNP in the last 8760 hours. HbA1C: No results for input(s): HGBA1C in the last 72 hours. CBG: No results for input(s): GLUCAP in the last 168 hours. Lipid Profile: No results for input(s): CHOL, HDL, LDLCALC, TRIG, CHOLHDL, LDLDIRECT in the last 72 hours. Thyroid Function Tests: No results for input(s): TSH, T4TOTAL, FREET4, T3FREE, THYROIDAB in the last 72 hours. Anemia Panel: No results for input(s): VITAMINB12, FOLATE, FERRITIN, TIBC, IRON, RETICCTPCT in the last 72 hours. Urine analysis:    Component Value Date/Time   COLORURINE YELLOW 05/09/2019 2222   APPEARANCEUR CLEAR 05/09/2019 2222   LABSPEC 1.015 05/09/2019 2222   PHURINE 6.0 05/09/2019 2222   GLUCOSEU NEGATIVE 05/09/2019 2222   HGBUR NEGATIVE 05/09/2019 2222   BILIRUBINUR NEGATIVE 05/09/2019 2222   KETONESUR 5 (A) 05/09/2019 2222   PROTEINUR NEGATIVE 05/09/2019 2222   UROBILINOGEN 1.0 04/17/2013 1151   NITRITE NEGATIVE 05/09/2019 2222   LEUKOCYTESUR NEGATIVE 05/09/2019 2222   Sepsis Labs:  @[email or web address] (procalcitonin:4,lacticidven:4) ) Recent Results (from the past 240 hour(s))  Urine culture     Status: Abnormal   Collection Time: 05/09/19 10:21 PM   Specimen: In/Out Cath Urine  Result Value Ref Range Status   Specimen Description   Final    IN/OUT CATH URINE Performed at [facility], [place] [address], [place], [place] [address]    Special Requests   Final    NONE Performed at [facility], [place] [address], [place], [place] [address]    Culture >=100,000 COLONIES/mL ENTEROCOCCUS FAECALIS (A)  Final   Report Status 05/12/2019 FINAL  Final   Organism ID, Bacteria ENTEROCOCCUS FAECALIS (A)  Final      Susceptibility   Enterococcus faecalis - MIC*    AMPICILLIN <=2 SENSITIVE Sensitive     LEVOFLOXACIN 1 SENSITIVE Sensitive     NITROFURANTOIN 32 SENSITIVE Sensitive     VANCOMYCIN 1 SENSITIVE Sensitive     * >=100,000 COLONIES/mL ENTEROCOCCUS FAECALIS  Culture, blood (Routine x 2)     Status: None   Collection Time: 05/09/19 10:22 PM   Specimen: BLOOD  Result Value Ref Range Status   Specimen Description   Final    BLOOD RIGHT ANTECUBITAL Performed at [place] [address], [place], [place] [address]    Special Requests   Final    BOTTLES DRAWN AEROBIC AND ANAEROBIC Blood Culture results may not be optimal due to an excessive volume of blood received in culture bottles Performed at [place] [address], [place], [place] [address]    Culture   Final    NO GROWTH 5 DAYS Performed at [facility],  [address] [place], [place] [address]    Report Status 05/14/2019 FINAL  Final  Culture, blood (Routine x 2)     Status: None   Collection Time: 05/09/19 10:22 PM   Specimen: BLOOD  Result Value Ref Range Status   Specimen Description   Final    BLOOD BLOOD LEFT FOREARM Performed at [facility] [address], [place], [place] [address]    Special  Requests   Final    BOTTLES DRAWN AEROBIC AND ANAEROBIC Blood Culture results may not be optimal due to an excessive volume of blood received in culture bottles Performed at [facility] [address], [place], [place] [address]    Culture   Final    NO GROWTH 5 DAYS Performed at [facility], [place] [address], [place], [place] [address]    Report Status 05/14/2019 FINAL  Final  SARS Coronavirus 2 by RT PCR (hospital order, performed in [facility] hospital lab) Nasopharyngeal Nasopharyngeal Swab     Status: None   Collection Time: 05/09/19 10:48 PM   Specimen: Nasopharyngeal Swab  Result Value Ref Range Status   SARS Coronavirus 2 NEGATIVE NEGATIVE Final    Comment: (NOTE) If result is NEGATIVE SARS-CoV-2 target nucleic acids are NOT DETECTED. The SARS-CoV-2 RNA is generally detectable in upper and lower  respiratory specimens during the acute phase of infection. The lowest  concentration of SARS-CoV-2 viral copies this assay can detect is 250  copies / mL. A negative result does not preclude SARS-CoV-2 infection  and should not be used as the sole basis for treatment or other  patient management decisions.  A negative result may occur with  improper specimen collection / handling, submission of specimen other  than nasopharyngeal swab, presence of viral mutation(s) within the  areas targeted by this assay, and inadequate number of viral copies  (<250 copies / mL). A negative result must be combined with clinical  observations, patient history, and epidemiological information. If result is POSITIVE SARS-CoV-2 target nucleic acids are DETECTED. The SARS-CoV-2 RNA is generally detectable in upper and lower  respiratory specimens dur ing the acute phase of infection.  Positive  results are indicative of active infection with SARS-CoV-2.  Clinical  correlation with patient history and other diagnostic information is  necessary to determine patient infection  status.  Positive results do  not rule out bacterial infection or co-infection with other viruses. If result is PRESUMPTIVE POSTIVE SARS-CoV-2 nucleic acids MAY BE PRESENT.   A presumptive positive result was obtained on the submitted specimen  and confirmed on repeat testing.  While 2019 novel coronavirus  (SARS-CoV-2) nucleic acids may be present in the submitted sample  additional confirmatory testing may be necessary for epidemiological  and / or clinical management purposes  to differentiate between  SARS-CoV-2 and other Sarbecovirus currently known to infect humans.  If clinically indicated additional testing with an alternate test  methodology [phone]) is advised. The SARS-CoV-2 RNA is generally  detectable in upper and lower respiratory sp ecimens during the acute  phase of infection. The expected result is Negative. Fact Sheet for Patients:  StrictlyIdeas.no Fact Sheet for Healthcare Providers: BankingDealers.co.za This test is not yet approved or cleared by the [place] FDA and has been authorized for detection and/or diagnosis of SARS-CoV-2 by FDA under an Emergency Use Authorization (EUA).  This EUA will remain in effect (meaning this test can be used) for the duration of the COVID-19 declaration under  Section 564(b)(1) of the Act, 21 U.S.C. section 360bbb-3(b)(1), unless the authorization is terminated or revoked sooner. Performed at [facility], [place] [address] [place], [place] [address]      Radiological Exams on Admission: Dg Chest 2 View  Result Date: 05/01/2019 CLINICAL DATA:  Shortness of breath. Pulmonary fibrosis. EXAM: CHEST - 2 VIEW COMPARISON:  Radiographs dated 05/09/2019 and CT scan dated 05/10/2019 FINDINGS: There are extensive bilateral peripheral hazy pulmonary infiltrates consistent with pneumonia, markedly progressed since the prior chest x-ray and chest CT. No effusions. Heart  size and pulmonary vascularity are normal. No bone abnormality. IMPRESSION: Extensive bilateral peripheral hazy pulmonary infiltrates consistent with pneumonia, markedly progressed since the prior chest x-ray and chest CT scan. Electronically Signed   By: Lorriane Shire M.D.   On: 05/01/2019 18:17    EKG: Independently reviewed. Sinus rhythm, QTc 515 ms.   Assessment/Plan   1. Multifocal PNA; acute on chronic hypoxic respiratory failure; ILD  - Presents with chills, increased SOB and cough, and increased supplemental O2 requirement, and is found to have marked progression in pneumonia with stable BP and reassuringly normal lactate  - Blood cultures were collected in ED and she was treated with 3 liters NS, vancomycin, and Cefepime  - Check COVID-19 and maintain airborne and contact precautions pending results  - Check sputum culture, strep pneumo and legionella antigens, check MRSA pcr, and continue antibiotics with Rocephin and azithromycin while following pending labs, cultures, and clinical course    2. Hypokalemia  - Serum potassium is 2.9 on admission with prolonged QT interval  - Replace with oral and IV potassium, empiric mag replacement, repeat chem panel in am   3. Hypertension  -  BP at goal - Hold HCTZ in light of electrolyte derangements, use labetalol IVP as needed for now    4. Hypothyroidism  - Continue Synthroid   5. Anemia  - Hgb is 9.0 on admission  - Appears stable, no bleeding, monitor   6. Prolonged QT interval  - QTc is 515 ms on admission  - Replace potassium to 4 and mag to 2, minimize QT-prolonging medications, and repeat EKG in am   7. Lymphocytic colitis  - Continue budesonide    PPE; CAPR, gown, gloves  DVT prophylaxis: Lovenox  Code Status: Full  Family Communication: husband updated at bedside Consults called: None  Admission status: Inpatient. Patient has pneumonia, complicated by underlying ILD and age, and given her high PORT/PSI score,  inpatient management is indicated.     Vianne Bulls, MD [facility] Pager [phone]  If 7PM-7AM, please contact night-coverage [email or web address] Password [facility]  05/09/2019, 9:44 PM

## 2019-05-16 NOTE — Telephone Encounter (Signed)
Called spoke with husband.  He states patient went to hospital on 10/13 fpr fever dx with pna.  Patient discharged home with O2 on 10/17 1L. Patient's husband called DME company to ask if they could increase O2, was told with Pulmonary Fibrosis do not turn O2 up , call pulmonary office.  Had husband place pulse ox on patient O2 was 83% 1L bled through cPAP. Told him to put O2 as nasal cannula in nose breathe in through nose out through mouth. And increase O2 to 2L. Patient sats went and staying at 87%. Told patient to bump O2 to 3L patient dropped back to 83%. Patient back to 2L maintaining sats of 87% told husband to call EMS. Husband voiced understanding and was calling EMS.  Will route to MR and BW as Juluis Rainier

## 2019-05-16 NOTE — Progress Notes (Signed)
A consult was received from an ED provider for cefepime + vancomycin per pharmacy dosing.  The patient's profile has been reviewed for ht/wt/allergies/indication/available labs.    A one time order has been placed for vancomycin 2000 mg IV once and cefepime 2 g IV once.    Further antibiotics/pharmacy consults should be ordered by admitting physician if indicated.                       Thank you, Lenis Noon, PharmD 04/30/2019  7:27 PM

## 2019-05-16 NOTE — ED Provider Notes (Signed)
  Face-to-face evaluation   History: She presents for worsening shortness of breath associated with hypoxia, at home.  She tried increasing her oxygen without relief.  She was recently discharged from hospital after treatment for "double pneumonia."  She denies her antibiotic pill, yesterday.  Physical exam:  Alert and cooperative.  She has mild tachypnea, without increased work of breathing.  She is lucid and cooperative.  Medical screening examination/treatment/procedure(s) were conducted as a shared visit with non-physician practitioner(s) and myself.  I personally evaluated the patient during the encounter    Daleen Bo, MD 05/17/19 1140

## 2019-05-16 NOTE — ED Notes (Signed)
Patient transported to X-ray 

## 2019-05-16 NOTE — ED Notes (Signed)
ED Provider at bedside. 

## 2019-05-17 ENCOUNTER — Inpatient Hospital Stay (HOSPITAL_COMMUNITY): Payer: Medicare Other

## 2019-05-17 ENCOUNTER — Other Ambulatory Visit (HOSPITAL_COMMUNITY): Payer: Medicare Other

## 2019-05-17 DIAGNOSIS — G8929 Other chronic pain: Secondary | ICD-10-CM

## 2019-05-17 DIAGNOSIS — J9621 Acute and chronic respiratory failure with hypoxia: Secondary | ICD-10-CM

## 2019-05-17 DIAGNOSIS — J849 Interstitial pulmonary disease, unspecified: Secondary | ICD-10-CM

## 2019-05-17 DIAGNOSIS — J189 Pneumonia, unspecified organism: Secondary | ICD-10-CM | POA: Diagnosis not present

## 2019-05-17 DIAGNOSIS — I10 Essential (primary) hypertension: Secondary | ICD-10-CM | POA: Diagnosis not present

## 2019-05-17 LAB — BASIC METABOLIC PANEL
Anion gap: 11 (ref 5–15)
BUN: 11 mg/dL (ref 8–23)
CO2: 23 mmol/L (ref 22–32)
Calcium: 9.4 mg/dL (ref 8.9–10.3)
Chloride: 102 mmol/L (ref 98–111)
Creatinine, Ser: 0.69 mg/dL (ref 0.44–1.00)
GFR calc Af Amer: >60 mL/min (ref >60)
GFR calc non Af Amer: >60 mL/min (ref >60)
Glucose, Bld: 137 mg/dL — ABNORMAL HIGH (ref 70–99)
Potassium: 3 mmol/L — ABNORMAL LOW (ref 3.5–5.1)
Sodium: 136 mmol/L (ref 135–145)

## 2019-05-17 LAB — URINALYSIS, ROUTINE W REFLEX MICROSCOPIC
Bilirubin Urine: NEGATIVE
Glucose, UA: NEGATIVE mg/dL
Hgb urine dipstick: NEGATIVE
Ketones, ur: NEGATIVE mg/dL
Leukocytes,Ua: NEGATIVE
Nitrite: NEGATIVE
Protein, ur: NEGATIVE mg/dL
Specific Gravity, Urine: 1.006 (ref 1.005–1.030)
pH: 6 (ref 5.0–8.0)

## 2019-05-17 LAB — MAGNESIUM: Magnesium: 2 mg/dL (ref 1.7–2.4)

## 2019-05-17 LAB — CBC WITH DIFFERENTIAL/PLATELET
Abs Immature Granulocytes: 0.11 10*3/uL — ABNORMAL HIGH (ref 0.00–0.07)
Basophils Absolute: 0.1 10*3/uL (ref 0.0–0.1)
Basophils Relative: 0 %
Eosinophils Absolute: 0.2 10*3/uL (ref 0.0–0.5)
Eosinophils Relative: 2 %
HCT: 29.9 % — ABNORMAL LOW (ref 36.0–46.0)
Hemoglobin: 9.1 g/dL — ABNORMAL LOW (ref 12.0–15.0)
Immature Granulocytes: 1 %
Lymphocytes Relative: 5 %
Lymphs Abs: 0.9 10*3/uL (ref 0.7–4.0)
MCH: 28.1 pg (ref 26.0–34.0)
MCHC: 30.4 g/dL (ref 30.0–36.0)
MCV: 92.3 fL (ref 80.0–100.0)
Monocytes Absolute: 1 10*3/uL (ref 0.1–1.0)
Monocytes Relative: 6 %
Neutro Abs: 13.7 10*3/uL — ABNORMAL HIGH (ref 1.7–7.7)
Neutrophils Relative %: 86 %
Platelets: 376 10*3/uL (ref 150–400)
RBC: 3.24 MIL/uL — ABNORMAL LOW (ref 3.87–5.11)
RDW: 20.6 % — ABNORMAL HIGH (ref 11.5–15.5)
WBC: 16 10*3/uL — ABNORMAL HIGH (ref 4.0–10.5)
nRBC: 0 % (ref 0.0–0.2)

## 2019-05-17 LAB — PHOSPHORUS: Phosphorus: 2.4 mg/dL — ABNORMAL LOW (ref 2.5–4.6)

## 2019-05-17 LAB — T4, FREE: Free T4: 1.19 ng/dL — ABNORMAL HIGH (ref 0.61–1.12)

## 2019-05-17 LAB — STREP PNEUMONIAE URINARY ANTIGEN: Strep Pneumo Urinary Antigen: NEGATIVE

## 2019-05-17 LAB — TSH: TSH: 6.067 u[IU]/mL — ABNORMAL HIGH (ref 0.350–4.500)

## 2019-05-17 LAB — FERRITIN: Ferritin: 151 ng/mL (ref 11–307)

## 2019-05-17 LAB — LACTATE DEHYDROGENASE: LDH: 311 U/L — ABNORMAL HIGH (ref 98–192)

## 2019-05-17 LAB — SEDIMENTATION RATE: Sed Rate: 125 mm/hr — ABNORMAL HIGH (ref 0–22)

## 2019-05-17 LAB — D-DIMER, QUANTITATIVE: D-Dimer, Quant: 6.18 ug/mL-FEU — ABNORMAL HIGH (ref 0.00–0.50)

## 2019-05-17 LAB — MRSA PCR SCREENING: MRSA by PCR: NEGATIVE

## 2019-05-17 LAB — SARS CORONAVIRUS 2 (TAT 6-24 HRS): SARS Coronavirus 2: NEGATIVE

## 2019-05-17 LAB — C-REACTIVE PROTEIN: CRP: 26.8 mg/dL — ABNORMAL HIGH (ref <1.0)

## 2019-05-17 MED ORDER — LORAZEPAM 1 MG PO TABS
1.0000 mg | ORAL_TABLET | Freq: Four times a day (QID) | ORAL | Status: DC | PRN
  Administered 2019-05-17 – 2019-05-19 (×5): 1 mg via ORAL
  Filled 2019-05-17 (×5): qty 1

## 2019-05-17 MED ORDER — BUDESONIDE 3 MG PO CPEP
6.0000 mg | ORAL_CAPSULE | Freq: Every day | ORAL | Status: DC
  Administered 2019-05-17 – 2019-05-20 (×4): 6 mg via ORAL
  Filled 2019-05-17 (×5): qty 2

## 2019-05-17 MED ORDER — DEXAMETHASONE SODIUM PHOSPHATE 10 MG/ML IJ SOLN
6.0000 mg | INTRAMUSCULAR | Status: DC
  Administered 2019-05-17: 6 mg via INTRAVENOUS
  Filled 2019-05-17: qty 1

## 2019-05-17 MED ORDER — FUROSEMIDE 10 MG/ML IJ SOLN
60.0000 mg | Freq: Once | INTRAMUSCULAR | Status: AC
  Administered 2019-05-17: 02:00:00 60 mg via INTRAVENOUS
  Filled 2019-05-17: qty 8

## 2019-05-17 MED ORDER — K PHOS MONO-SOD PHOS DI & MONO 155-852-130 MG PO TABS
500.0000 mg | ORAL_TABLET | Freq: Once | ORAL | Status: AC
  Administered 2019-05-17: 500 mg via ORAL
  Filled 2019-05-17: qty 2

## 2019-05-17 MED ORDER — IPRATROPIUM-ALBUTEROL 20-100 MCG/ACT IN AERS
1.0000 | INHALATION_SPRAY | Freq: Four times a day (QID) | RESPIRATORY_TRACT | Status: DC
  Administered 2019-05-17 (×2): 1 via RESPIRATORY_TRACT
  Filled 2019-05-17: qty 4

## 2019-05-17 MED ORDER — SODIUM CHLORIDE 0.9 % IV SOLN
2.0000 g | Freq: Three times a day (TID) | INTRAVENOUS | Status: DC
  Administered 2019-05-17 – 2019-05-21 (×12): 2 g via INTRAVENOUS
  Filled 2019-05-17 (×12): qty 2

## 2019-05-17 MED ORDER — POTASSIUM CHLORIDE CRYS ER 20 MEQ PO TBCR
40.0000 meq | EXTENDED_RELEASE_TABLET | Freq: Two times a day (BID) | ORAL | Status: AC
  Administered 2019-05-17 (×2): 40 meq via ORAL
  Filled 2019-05-17 (×2): qty 2

## 2019-05-17 MED ORDER — FUROSEMIDE 10 MG/ML IJ SOLN
20.0000 mg | Freq: Once | INTRAMUSCULAR | Status: AC
  Administered 2019-05-17: 20 mg via INTRAVENOUS
  Filled 2019-05-17: qty 2

## 2019-05-17 MED ORDER — LEVOTHYROXINE SODIUM 25 MCG PO TABS
125.0000 ug | ORAL_TABLET | Freq: Every day | ORAL | Status: DC
  Administered 2019-05-17 – 2019-05-20 (×4): 125 ug via ORAL
  Filled 2019-05-17 (×4): qty 1

## 2019-05-17 MED ORDER — GUAIFENESIN ER 600 MG PO TB12
1200.0000 mg | ORAL_TABLET | Freq: Two times a day (BID) | ORAL | Status: DC
  Administered 2019-05-17 – 2019-05-20 (×6): 1200 mg via ORAL
  Filled 2019-05-17 (×6): qty 2

## 2019-05-17 MED ORDER — CHLORHEXIDINE GLUCONATE CLOTH 2 % EX PADS
6.0000 | MEDICATED_PAD | Freq: Every day | CUTANEOUS | Status: DC
  Administered 2019-05-18 – 2019-05-19 (×2): 6 via TOPICAL

## 2019-05-17 MED ORDER — ENSURE ENLIVE PO LIQD
237.0000 mL | Freq: Two times a day (BID) | ORAL | Status: DC
  Administered 2019-05-17 – 2019-05-18 (×3): 237 mL via ORAL

## 2019-05-17 MED ORDER — ASPIRIN 81 MG PO CHEW
81.0000 mg | CHEWABLE_TABLET | Freq: Every day | ORAL | Status: DC
  Administered 2019-05-17 – 2019-05-20 (×4): 81 mg via ORAL
  Filled 2019-05-17 (×4): qty 1

## 2019-05-17 MED ORDER — ORAL CARE MOUTH RINSE
15.0000 mL | Freq: Two times a day (BID) | OROMUCOSAL | Status: DC
  Administered 2019-05-17 – 2019-05-20 (×5): 15 mL via OROMUCOSAL

## 2019-05-17 MED ORDER — IPRATROPIUM-ALBUTEROL 0.5-2.5 (3) MG/3ML IN SOLN
3.0000 mL | Freq: Four times a day (QID) | RESPIRATORY_TRACT | Status: DC
  Administered 2019-05-17 – 2019-05-20 (×10): 3 mL via RESPIRATORY_TRACT
  Filled 2019-05-17 (×10): qty 3

## 2019-05-17 MED ORDER — LIP MEDEX EX OINT
TOPICAL_OINTMENT | CUTANEOUS | Status: DC | PRN
  Filled 2019-05-17: qty 7

## 2019-05-17 MED ORDER — ADULT MULTIVITAMIN W/MINERALS CH
1.0000 | ORAL_TABLET | Freq: Every day | ORAL | Status: DC
  Administered 2019-05-17 – 2019-05-18 (×2): 1 via ORAL
  Filled 2019-05-17 (×3): qty 1

## 2019-05-17 MED ORDER — POTASSIUM CHLORIDE 10 MEQ/100ML IV SOLN
10.0000 meq | INTRAVENOUS | Status: AC
  Administered 2019-05-17 (×2): 10 meq via INTRAVENOUS
  Filled 2019-05-17 (×3): qty 100

## 2019-05-17 MED ORDER — PRAVASTATIN SODIUM 20 MG PO TABS
20.0000 mg | ORAL_TABLET | Freq: Every day | ORAL | Status: DC
  Administered 2019-05-17 – 2019-05-20 (×3): 20 mg via ORAL
  Filled 2019-05-17 (×3): qty 1

## 2019-05-17 MED ORDER — MELATONIN 5 MG PO TABS
7.5000 mg | ORAL_TABLET | Freq: Every evening | ORAL | Status: DC | PRN
  Filled 2019-05-17: qty 1.5

## 2019-05-17 MED ORDER — ENOXAPARIN SODIUM 40 MG/0.4ML ~~LOC~~ SOLN
40.0000 mg | SUBCUTANEOUS | Status: DC
  Administered 2019-05-17 – 2019-05-20 (×4): 40 mg via SUBCUTANEOUS
  Filled 2019-05-17 (×4): qty 0.4

## 2019-05-17 MED ORDER — METHYLPREDNISOLONE SODIUM SUCC 40 MG IJ SOLR
40.0000 mg | Freq: Two times a day (BID) | INTRAMUSCULAR | Status: DC
  Administered 2019-05-17 – 2019-05-18 (×2): 40 mg via INTRAVENOUS
  Filled 2019-05-17 (×2): qty 1

## 2019-05-17 MED ORDER — SODIUM CHLORIDE 0.9 % IV SOLN: 250.0000 mL | INTRAVENOUS | Status: DC | PRN

## 2019-05-17 MED ORDER — LORAZEPAM 2 MG/ML IJ SOLN
1.0000 mg | Freq: Once | INTRAMUSCULAR | Status: AC
  Administered 2019-05-17: 1 mg via INTRAVENOUS
  Filled 2019-05-17: qty 1

## 2019-05-17 MED ORDER — SODIUM CHLORIDE 0.9% FLUSH
3.0000 mL | Freq: Two times a day (BID) | INTRAVENOUS | Status: DC
  Administered 2019-05-17 – 2019-05-20 (×8): 3 mL via INTRAVENOUS

## 2019-05-17 MED ORDER — SODIUM CHLORIDE 0.9% FLUSH
3.0000 mL | Freq: Two times a day (BID) | INTRAVENOUS | Status: DC
  Administered 2019-05-17 – 2019-05-21 (×6): 3 mL via INTRAVENOUS

## 2019-05-17 MED ORDER — DOCUSATE SODIUM 100 MG PO CAPS
100.0000 mg | ORAL_CAPSULE | Freq: Two times a day (BID) | ORAL | Status: DC
  Administered 2019-05-17 – 2019-05-20 (×6): 100 mg via ORAL
  Filled 2019-05-17 (×6): qty 1

## 2019-05-17 MED ORDER — SODIUM CHLORIDE 0.9% FLUSH: 3.0000 mL | INTRAVENOUS | Status: DC | PRN

## 2019-05-17 NOTE — Progress Notes (Signed)
Initial Nutrition Assessment  RD working remotely.   DOCUMENTATION CODES:   Obesity unspecified  INTERVENTION:  - will order Ensure Enlive BID, each supplement provides 350 kcal and 20 grams of protein. - will order Magic Cup BID with meals, each supplement provides 290 kcal and 9 grams of protein. - will order daily multivitamin with minerals.    NUTRITION DIAGNOSIS:   Increased nutrient needs related to acute illness as evidenced by estimated needs.  GOAL:   Patient will meet greater than or equal to 90% of their needs  MONITOR:   PO intake, Supplement acceptance, Labs, Weight trends  REASON FOR ASSESSMENT:   Consult Assessment of nutrition requirement/status  ASSESSMENT:   73 y.o. female with medical history significant for interstitial lung disease, hypothyroidism, lymphocytic colitis, HTN, and recent admission with PNA, discharged on 1L oxygen. She returned to the ED on 10/20 with chills, worsening dyspnea, cough, and increased O2 requirement. In the ED she was febrile and required 3L O2. CXR concerning for extensive bilateral peripheral infiltrates consistent with progressed PNA.  Diet advanced from NPO to Heart Healthy yesterday at 2248 and no intakes have been documented from breakfast. Per chart review, current weight is 194 lb, weight on 10/13 and 10/1 was 199 lb, and weight on 9/11 was 195 lb. Will continue to monitor weight trends. Patient was not assessed by a RD during recent hospitalization or by a [place] RD at any time in the past.   She denies any recent weight changes that she has noticed, but does report decreased appetite over the past 3 weeks d/t SOB/PNA and recent worsening in SOB. She denies any chewing or swallowing difficulties and denies any abdominal pain/pressure or N/V today or PTA. She is unsure if SOB worsens when she eats and drinks.   Per notes: - multifocal PNA with acute on chronic hypoxia - pending COVID-19 result - hypokalemia--2.9  mmol/l on admission; repletion ordered - anemia - lymphocytic colitis    Labs reviewed; K: 3 mmol/l, Phos: 2.4 mg/dl.  Medications reviewed; 100 mg colace BID, 60 mg IV lasix x1 dose 10/21, 125 mcg oral synthroid/day, 1 g IV Mg sulfate x1 run 10/20, 500 mg KPhos neutral x1 dose 10/21, 10 mEq IV KCl x2 runs 10/21, 40 mEq Klor-Con BID.     NUTRITION - FOCUSED PHYSICAL EXAM:  unable to complete for patient on airborne precautions.   Diet Order:   Diet Order            Diet Heart Room service appropriate? Yes; Fluid consistency: Thin  Diet effective now              EDUCATION NEEDS:   Not appropriate for education at this time  Skin:  Skin Assessment: Reviewed RN Assessment  Last BM:  PTA/unknown  Height:   Ht Readings from Last 1 Encounters:  05/17/19 5\' 4"  (1.626 m)    Weight:   Wt Readings from Last 1 Encounters:  05/17/19 88.1 kg    Ideal Body Weight:  54.5 kg  BMI:  Body mass index is 33.34 kg/m.  Estimated Nutritional Needs:   Kcal:  1940-2115 kcal  Protein:  105-115 grams  Fluid:  >/= 2 L/day      Jarome Matin, MS, RD, LDN, [facility] Inpatient Clinical Dietitian Pager # [phone] After hours/weekend pager # [phone]

## 2019-05-17 NOTE — ED Notes (Signed)
` ED TO INPATIENT HANDOFF REPORT  Name/Age/Gender [name] 73 y.o. female  Code Status Code Status History    Date Active Date Inactive Code Status Order ID Comments User Context   05/10/2019 0200 05/13/2019 1742 Full Code [id number]  Rise Patience, MD ED   05/02/2019 1047 05/03/2019 1724 Full Code [id number]  Maurice March, PA-C Inpatient   04/04/2015 1559 04/05/2015 1928 Full Code [id number]  Wylene Simmer, MD Inpatient   04/03/2015 1134 04/04/2015 1554 Full Code [id number]  Corky Sing, PA-C Inpatient   Advance Care Planning Activity    Advance Directive Documentation     Most Recent Value  Type of Advance Directive  Living will, Healthcare Power of Attorney  Pre-existing out of facility DNR order (yellow form or pink MOST form)  -  "MOST" Form in Place?  -      Home/SNF/Other Nursing Home  Chief Complaint Shortness of Breath  Level of Care/Admitting Diagnosis ED Disposition    ED Disposition Condition [place]: [place] [100102]  Level of Care: Stepdown [14]  Admit to SDU based on following criteria: Respiratory Distress:  Frequent assessment and/or intervention to maintain adequate ventilation/respiration, pulmonary toilet, and respiratory treatment.  Covid Evaluation: Person Under Investigation (PUI)  Diagnosis: Pneumonia [227785]  Admitting Physician: Vianne Bulls [id number]  Attending Physician: Vianne Bulls [id number]  Estimated length of stay: past midnight tomorrow  Certification:: I certify this patient will need inpatient services for at least 2 midnights  PT Class (Do Not Modify): Inpatient [101]  PT Acc Code (Do Not Modify): Private [1]       Medical History Past Medical History:  Diagnosis Date  . Anxiety state, unspecified   . Benign neoplasm of colon   . Brain tumor ([place])   . Cancer ([place])   . Colon polyps    hyperplastic  . Complication of anesthesia    requests dilaudid for pain  .  Complications affecting other specified body systems, hypertension    No cardiologist   . Contact lens/glasses fitting    wears glassses or contacts  . Depressive disorder, not elsewhere classified   . Dyspnea    due to fibrosis  . Esophageal reflux   . Esophageal ring   . Family history of colon cancer mother  . Fundic gland polyps of stomach, benign   . Hemorrhoids   . Hiatal hernia   . History of kidney stones   . Hypertension   . Hypothyroidism   . Lymphocytic colitis   . Obesity   . Other and unspecified hyperlipidemia   . Pneumonia 02/28/2019  . Pulmonary fibrosis ([place])   . Seizures ([facility])    x 1    Allergies Allergies  Allergen Reactions  . Shellfish Allergy Anaphylaxis    Lip swelling  . Codeine Other (See Comments)    nightmares  . Hydrocod Polst-Cpm Polst Er Itching  . Hydrocodone-Acetaminophen Itching and Other (See Comments)    Has to take benadryl with to tolerate  . Sulfa Antibiotics Rash  . Sulfonamide Derivatives Rash    IV Location/Drains/Wounds Patient Lines/Drains/Airways Status   Active Line/Drains/Airways    Name:   Placement date:   Placement time:   Site:   Days:   Peripheral IV 05/12/2019 Right Forearm   05/17/2019    1855    Forearm   1   Peripheral IV 05/04/2019 Right Forearm   04/28/2019    1857  Forearm   1   External Urinary Catheter   05/26/2019    1942    -   1   Incision 04/12/13 Back Other (Comment)   04/12/13    1314     2226   Incision (Closed) 04/04/15 Ankle Left   04/04/15    1411     1504   Incision (Closed) 05/02/19 Knee   05/02/19    0841     15          Labs/Imaging Results for orders placed or performed during the hospital encounter of 05/17/2019 (from the past 48 hour(s))  CBC with Differential     Status: Abnormal   Collection Time: 05/04/2019  6:55 PM  Result Value Ref Range   WBC 15.4 (H) 4.0 - 10.5 K/uL   RBC 3.21 (L) 3.87 - 5.11 MIL/uL   Hemoglobin 9.0 (L) 12.0 - 15.0 g/dL   HCT 29.3 (L) 36.0 - 46.0 %   MCV 91.3 80.0 -  100.0 fL   MCH 28.0 26.0 - 34.0 pg   MCHC 30.7 30.0 - 36.0 g/dL   RDW 20.7 (H) 11.5 - 15.5 %   Platelets 358 150 - 400 K/uL   nRBC 0.0 0.0 - 0.2 %   Neutrophils Relative % 81 %   Neutro Abs 12.4 (H) 1.7 - 7.7 K/uL   Lymphocytes Relative 10 %   Lymphs Abs 1.5 0.7 - 4.0 K/uL   Monocytes Relative 7 %   Monocytes Absolute 1.1 (H) 0.1 - 1.0 K/uL   Eosinophils Relative 2 %   Eosinophils Absolute 0.3 0.0 - 0.5 K/uL   Basophils Relative 0 %   Basophils Absolute 0.0 0.0 - 0.1 K/uL   Immature Granulocytes 0 %   Abs Immature Granulocytes 0.05 0.00 - 0.07 K/uL    Comment: Performed at [facility], [place] [address], [place], [place] [id number]  Basic metabolic panel     Status: Abnormal   Collection Time: 05/27/2019  6:55 PM  Result Value Ref Range   Sodium 135 135 - 145 mmol/L   Potassium 2.9 (L) 3.5 - 5.1 mmol/L   Chloride 101 98 - 111 mmol/L   CO2 23 22 - 32 mmol/L   Glucose, Bld 125 (H) 70 - 99 mg/dL   BUN 19 8 - 23 mg/dL   Creatinine, Ser 0.67 0.44 - 1.00 mg/dL   Calcium 10.2 8.9 - 10.3 mg/dL   GFR calc non Af Amer >60 >60 mL/min   GFR calc Af Amer >60 >60 mL/min   Anion gap 11 5 - 15    Comment: Performed at [facility], [place] [address], [place], [place] [address]  Lactic acid, plasma     Status: None   Collection Time: 05/19/2019  6:55 PM  Result Value Ref Range   Lactic Acid, Venous 1.3 0.5 - 1.9 mmol/L    Comment: Performed at [facility], [place] [address] [place],  [address]  Blood gas, venous     Status: Abnormal   Collection Time: 04/29/2019  8:45 PM  Result Value Ref Range   pH, Ven 7.429 7.250 - 7.430   pCO2, Ven 40.9 (L) 44.0 - 60.0 mmHg   pO2, Ven BELOW REPORTABLE RANGE 32.0 - 45.0 mmHg    Comment: CRITICAL RESULT CALLED TO, READ BACK BY AND VERIFIED WITH: [name],B @ 2128 ON 05/14/2019 BY POTEAT,S    Bicarbonate 26.6 20.0 - 28.0 mmol/L   Acid-Base Excess 2.6 (H) 0.0 -  2.0 mmol/L   O2 Saturation 36.2 %   Patient  temperature 98.6     Comment: Performed at [place] [address], [place], [place] [address]  Comprehensive metabolic panel     Status: Abnormal   Collection Time: 05/15/2019  9:00 PM  Result Value Ref Range   Sodium 135 135 - 145 mmol/L   Potassium 2.7 (LL) 3.5 - 5.1 mmol/L    Comment: CRITICAL RESULT CALLED TO, READ BACK BY AND VERIFIED WITH: Shelsy Seng,B @ 2215 ON 05/26/2019 BY POTEAT,S    Chloride 102 98 - 111 mmol/L   CO2 24 22 - 32 mmol/L   Glucose, Bld 145 (H) 70 - 99 mg/dL   BUN 17 8 - 23 mg/dL   Creatinine, Ser 0.63 0.44 - 1.00 mg/dL   Calcium 10.0 8.9 - 10.3 mg/dL   Total Protein 6.6 6.5 - 8.1 g/dL   Albumin 2.8 (L) 3.5 - 5.0 g/dL   AST 34 15 - 41 U/L   ALT 58 (H) 0 - 44 U/L   Alkaline Phosphatase 114 38 - 126 U/L   Total Bilirubin 0.5 0.3 - 1.2 mg/dL   GFR calc non Af Amer >60 >60 mL/min   GFR calc Af Amer >60 >60 mL/min   Anion gap 9 5 - 15    Comment: Performed at [facility], [place] [address] [place], [place] [address]  APTT     Status: None   Collection Time: 05/15/2019  9:00 PM  Result Value Ref Range   aPTT 27 24 - 36 seconds    Comment: Performed at [facility], [place] [address], [place], [place] [address]  Protime-INR     Status: Abnormal   Collection Time: 05/05/2019  9:00 PM  Result Value Ref Range   Prothrombin Time 15.9 (H) 11.4 - 15.2 seconds   INR 1.3 (H) 0.8 - 1.2    Comment: (NOTE) INR goal varies based on device and disease states. Performed at [facility], [place] [address], [place], [place] [address]    Dg Chest 2 View  Result Date: 05/19/2019 CLINICAL DATA:  Shortness of breath. Pulmonary fibrosis. EXAM: CHEST - 2 VIEW COMPARISON:  Radiographs dated 05/09/2019 and CT scan dated 05/10/2019 FINDINGS: There are extensive bilateral peripheral hazy pulmonary infiltrates consistent with pneumonia, markedly progressed since the prior chest x-ray and chest CT. No effusions. Heart size  and pulmonary vascularity are normal. No bone abnormality. IMPRESSION: Extensive bilateral peripheral hazy pulmonary infiltrates consistent with pneumonia, markedly progressed since the prior chest x-ray and chest CT scan. Electronically Signed   By: Lorriane Shire M.D.   On: 04/30/2019 18:17    Pending Labs Unresulted Labs (From admission, onward)    Start     Ordered   05/08/2019 2134  MRSA PCR Screening  Once,   STAT     05/27/2019 2133   04/30/2019 2133  Culture, sputum-assessment  Once,   R     05/10/2019 2133   05/20/2019 2133  Legionella Pneumophila Serogp 1 Ur Ag  Once,   STAT     05/23/2019 2133   05/20/2019 2133  Strep pneumoniae urinary antigen  Once,   STAT     05/22/2019 2133   05/18/2019 1924  SARS CORONAVIRUS 2 (TAT 6-24 HRS) Nasopharyngeal Nasopharyngeal Swab  (Symptomatic/High Risk of Exposure/Tier 1 Patients Labs with Precautions)  Once,   STAT    Question Answer Comment  Is this test for diagnosis or screening Diagnosis  of ill patient   Symptomatic for COVID-19 as defined by CDC Yes   Date of Symptom Onset 05/02/2019   Hospitalized for COVID-19 No   Admitted to ICU for COVID-19 No   Previously tested for COVID-19 No   Resident in a congregate (group) care setting No   Employed in healthcare setting No   Pregnant No      05/18/2019 1923   05/09/2019 1820  Urinalysis, Routine w reflex microscopic  Once,   STAT     05/15/2019 1820   05/25/2019 1820  Urine culture  ONCE - STAT,   STAT     05/25/2019 1820   05/09/2019 1820  Blood culture (routine x 2)  BLOOD CULTURE X 2,   STAT     05/09/2019 1820   Signed and Held  Creatinine, serum  (enoxaparin (LOVENOX)    CrCl >/= 30 ml/min)  Weekly,   R    Comments: while on enoxaparin therapy    Signed and Held   Signed and Held  Basic metabolic panel  Tomorrow morning,   R     Signed and Held   Signed and Held  Magnesium  Tomorrow morning,   R     Signed and Held   Signed and Held  CBC WITH DIFFERENTIAL  Tomorrow morning,   R     Signed and Held           Vitals/Pain Today's Vitals   05/02/2019 2100 05/03/2019 2130 05/18/2019 2200 05/15/2019 2300  BP: (!) 152/58 (!) 146/61 (!) 139/57 (!) 120/100  Pulse: 87 87 83 85  Resp: (!) 29 (!) 25 (!) 27 (!) 28  Temp:      TempSrc:      SpO2: 94% 93% 91% 94%  PainSc:        Isolation Precautions Airborne and Contact precautions  Medications Medications  sodium chloride 0.9 % bolus 1,000 mL (0 mLs Intravenous Stopped 05/24/2019 2223)    And  sodium chloride 0.9 % bolus 1,000 mL (0 mLs Intravenous Stopped 05/17/19 0308)    And  sodium chloride 0.9 % bolus 1,000 mL (1,000 mLs Intravenous New Bag/Given 05/15/2019 2045)  potassium chloride 10 mEq in 100 mL IVPB (0 mEq Intravenous Stopped 05/17/19 0307)  magnesium sulfate IVPB 1 g 100 mL (1 g Intravenous Transfusing/Transfer 05/17/19 0319)  cefTRIAXone (ROCEPHIN) 2 g in sodium chloride 0.9 % 100 mL IVPB (0 g Intravenous Stopped 05/17/19 0107)  azithromycin (ZITHROMAX) 500 mg in sodium chloride 0.9 % 250 mL IVPB (500 mg Intravenous New Bag/Given 05/17/19 0028)  HYDROmorphone (DILAUDID) tablet 2-4 mg (has no administration in time range)  diphenoxylate-atropine (LOMOTIL) 2.5-0.025 MG per tablet 1-2 tablet (has no administration in time range)  methocarbamol (ROBAXIN) tablet 500 mg (has no administration in time range)  acetaminophen (TYLENOL) tablet 650 mg (has no administration in time range)    Or  acetaminophen (TYLENOL) suppository 650 mg (has no administration in time range)  albuterol (VENTOLIN HFA) 108 (90 Base) MCG/ACT inhaler 2 puff (has no administration in time range)  potassium chloride 10 mEq in 100 mL IVPB (has no administration in time range)  LORazepam (ATIVAN) tablet 1 mg (has no administration in time range)  ceFEPIme (MAXIPIME) 2 g in sodium chloride 0.9 % 100 mL IVPB (0 g Intravenous Stopped 04/27/2019 2223)  acetaminophen (TYLENOL) tablet 650 mg (650 mg Oral Given 05/20/2019 2053)  vancomycin (VANCOCIN) 2,000 mg in sodium chloride 0.9 %  500 mL IVPB (0 mg Intravenous Stopped 05/25/2019  2223)  potassium chloride SA (KLOR-CON) CR tablet 40 mEq (40 mEq Oral Given 04/30/2019 2339)  furosemide (LASIX) injection 60 mg (60 mg Intravenous Given 05/17/19 0134)  LORazepam (ATIVAN) injection 1 mg (1 mg Intravenous Given 05/17/19 0309)    Mobility non-ambulatory

## 2019-05-17 NOTE — Progress Notes (Addendum)
PROGRESS NOTE    [name]  FHL:[id number] DOB: June 07, 1946 DOA: 05/13/2019 PCP: Robyne Peers, MD   Brief Narrative:  HPI per Dr. Mitzi Hansen on 05/10/2019 [name] is a 73 y.o. female with medical history significant for interstitial lung disease, hypothyroidism, lymphocytic colitis, hypertension, and recent admission with pneumonia, discharged on 1 L of supplemental oxygen, now returning to the ED with chills, worsened dyspnea and cough, and increased supplemental oxygen requirement.  Patient reports that she was feeling much improved when she left the hospital on 05/13/2019 and continued to do well back at home until last night when she developed shaking chills.  She also has increase in her shortness of breath and nonproductive cough over the past 24 hours.  She denies any chest pain, leg swelling, or calf tenderness.  No one with fever or respiratory symptoms at home.  She denies abdominal pain, vomiting, or diarrhea since the recent admission.  ED Course: Upon arrival to the ED, patient is found to be febrile to 38.2 C, requiring 3 L/min of supplemental oxygen to maintain saturations in the low 90s, mildly tachypneic, and with stable blood pressure.  EKG features a sinus rhythm with a QTc interval of 550 ms.  Chest x-ray is concerning for extensive bilateral peripheral hazy infiltrates consistent with markedly progressed pneumonia.  Chemistry panel is notable for potassium of 2.9.  CBC features a leukocytosis to 15,400 and a stable normocytic anemia.  Blood cultures were collected, 3 L normal saline administered, and the patient was treated with acetaminophen, cefepime, and vancomycin.  COVID-19 testing has been ordered but not yet performed.  **Interim History Continues to be tachypneic and wearing supplemental oxygen via nasal cannula.  Pulmonary was consulted for further evaluation recommendations.  Medications were adjusted and IV dexamethasone was added while SARS-CoV-2  testing is pending.  Assessment & Plan:   Principal Problem:   Pneumonia Active Problems:   ILD (interstitial lung disease) ([place])   Essential hypertension   Hypothyroidism   Acute on chronic respiratory failure with hypoxia (HCC)   Hypokalemia   Prolonged QT interval   Chronic pain  Sepsis 2/2 Multifocal PNA complicated in the setting of ILD and Pulmonary Fibrosis  Acute on chronic hypoxic respiratory failure now requiring 6 Liters of HFNC; -Presented with chills, increased SOB and cough, and increased supplemental O2 requirement, and is found to have marked progression in pneumonia with stable BP and reassuringly normal lactate; Continues to be Febrile, Tachycardic and Tachypenic  -Blood cultures were collected in ED and she was treated with 3 liters NS, IV Vancomycin, and  IV Cefepime; -After IVF Hydration was given Diuresis with IV Furosemide 60 mg this AM -Check COVID-19 and maintain airborne and contact precautions pending results; SARS-CoV-2 Still pending but was Negative 1 week ago -Check sputum culture, strep pneumo and legionella antigens, -Checked MRSA PCR and was Negative -Continue antibiotics with Rocephin and azithromycin while following pending labs, cultures, and clinical course   -Added Combivent 1 puff q6h and C/w Albuterol Inhaler prn -Added Incentive Spirometry -Start Dexamethasone 6 mg IV x10 for now and also added Guaifenesin 1200 mg po BID -Recent CTA PE onh 05/10/2019 showed "Acute patchy airspace disease, favor atypical pneumonia. Given there is a background of chronic interstitial lung disease acute alveolitis is also considered. No evidence for pulmonary embolism but notably limited in the upper lobes due to bolus timing." -Check Inflammatory Markers and LDH was 311, and D-Dimer was 6.18, Ferriting was 151, CRP was 26.8  and ESR was 125 -Repeat CXR showed "Stable diffuse bilateral lung opacities are noted concerning for multifocal pneumonia." -WBC was 15.4 on  admission and is now 16.0 -Follow Cx Data and Temperature Curve; Check Procalcitonin Level  -Pulmonary Consulted for further evaluation and assistance in Management   Hypertension  -BPcurrently was 131/59 and was elevated to 191/94  -Continue to Hold HCTZ in light of electrolyte derangements, use labetalol IVP as needed for now    Normocytic Anemia  -Hgb is 9.0 on admission; Repeat now showed a Hgb/Hct of 9.1/29.9 -Check Anemia Panel in the AM  -Appears stable and continue to Monitor for S/Sx of Bleeding; Currently no overt bleeding noted  -Repeat CBC in AM   Prolonged QT interval  -QTc is 515 ms on admission  -Replace potassium to 4 and mag to 2, minimize QT-prolonging medications, and repeat EKG in am   Lymphocytic Colitis  -Continue Budesonide 6 mg po Daily and have added Dexamethazone 6 mg IV for Respiratory Issues; Defer to Pulmonary to adjust  -C/w Lomotil 1-2 tab po 4 Times Daily   Hypothyroidism -Checked TSH and was 6.067; Will Check Free T4 and T3 -C/w Levothyroxine 125 mcg po Daily   HLD -C/w Pravastatin 20 mg po Daily   Obesity -Estimated body mass index is 33.34 kg/m as calculated from the following:   Height as of this encounter: '5\' 4"'$  (1.626 m).   Weight as of this encounter: 88.1 kg. -Weight Loss and Dietary Counseling given -Nutritionist consulted for further evaluation and Recc's and Nutritionist ordering Ensure Enlive BID and Magic Cup BID and Daily MVI+ Minerals   Anxiety and Depression -C/w Lorazepam 1 mg po q6hprn Anxiety   Elevated ALT -In the setting of PNA -Was not repeated this AM but will repeat tomorrow AM -If not improving or worsening will obtain a RUQ U/S and Acute Hepatitis Panel -Repeat CMP in AM   Electrolyte Abnormalities  Hypokalemia -Patient's K+ this AM was 3.0 -Mag Level was 2.0 -Replete with po KCl 40 mEQ BID x 2 Doses -Continue to Monitor and Replete as Necessary  Hypophosphatemia -Patient's Phos Level this AM was  2.4 -Replete with po KPhos Neutral 500 mg x1 -Continue to Monitor and Replete as Necessary -Repeat Phos Level in AM   DVT prophylaxis: Enoxaparin 40 mg sq q24h Code Status: FULL CODE  Family Communication: No family present at bedside  Disposition Plan: Remain in SDU for further evaluation and care  Consultants:   Pulmonary   Procedures:  None  Antimicrobials:  Anti-infectives (From admission, onward)   Start     Dose/Rate Route Frequency Ordered Stop   05/17/2019 2200  cefTRIAXone (ROCEPHIN) 2 g in sodium chloride 0.9 % 100 mL IVPB     2 g 200 mL/hr over 30 Minutes Intravenous Every 24 hours 05/08/2019 2133 06/14/2019 2159   05/25/2019 2200  azithromycin (ZITHROMAX) 500 mg in sodium chloride 0.9 % 250 mL IVPB     500 mg 250 mL/hr over 60 Minutes Intravenous Every 24 hours 04/27/2019 2133 2019/06/14 2159   05/22/2019 1945  vancomycin (VANCOCIN) 2,000 mg in sodium chloride 0.9 % 500 mL IVPB     2,000 mg 250 mL/hr over 120 Minutes Intravenous  Once 05/15/2019 1925 04/30/2019 2223   05/17/2019 1930  vancomycin (VANCOCIN) IVPB 1000 mg/200 mL premix  Status:  Discontinued     1,000 mg 200 mL/hr over 60 Minutes Intravenous  Once 05/04/2019 1922 04/28/2019 1925   05/14/2019 1930  ceFEPIme (MAXIPIME) 2 g in  sodium chloride 0.9 % 100 mL IVPB     2 g 200 mL/hr over 30 Minutes Intravenous  Once 05/03/2019 1922 05/03/2019 2223     Subjective: Seen and examined at bedside her respiratory status is still tenuous with increased work of breathing and respirations are in the high 20s low 30s.  Patient's oxygen requirement had to be increased to 6 L of high flow nasal cannula.  She denies any current shortness of breath..  No nausea or vomiting feels "okay".  Pulmonary was consulted for further evaluation.  No chest pain.  No other concerns complaints at this time.  Objective: Vitals:   05/17/19 0358 05/17/19 0408 05/17/19 0415 05/17/19 0500  BP:  (!) 140/56  (!) 135/53  Pulse: 96 90  88  Resp: (!) 33 (!) 27  (!) 32    Temp:   99.5 F (37.5 C)   TempSrc:   Oral   SpO2: (!) 88% 100%  91%  Weight:   88.1 kg   Height:   '5\' 4"'$  (1.626 m)     Intake/Output Summary (Last 24 hours) at 05/17/2019 0720 Last data filed at 05/17/2019 0600 Gross per 24 hour  Intake 4023.66 ml  Output 1650 ml  Net 2373.66 ml   Filed Weights   05/17/19 0415  Weight: 88.1 kg   Examination: Physical Exam:  Constitutional: WN/WD obese Caucasian female in mild respiratory distress and appears uncomfortable  Eyes: Lids and conjunctivae normal, sclerae anicteric  ENMT: External Ears, Nose appear normal. Grossly normal hearing.  Neck: Appears normal, supple, no cervical masses, normal ROM, no appreciable thyromegaly; no JVD Respiratory: Diminished to auscultation bilaterally with coarse breath sounds and some rhonchi. No appreciable wheezing, rales, rhonchi or crackles. Increased respiratory effort and is a little tachypenic. Wearing 6 Liters of supplemental O2 via [place] Cardiovascular: Tachycardic rate but regular rhythm, no murmurs / rubs / gallops. S1 and S2 auscultated. Trace extremity edema.  Abdomen: Soft, non-tender, Distended. Bowel sounds positive x4.  GU: Deferred. Musculoskeletal: No clubbing / cyanosis of digits/nails. No joint deformity upper and lower extremities Skin: No rashes, lesions, ulcers on a limited skin evaluation. No induration; Warm and dry.  Neurologic: CN 2-12 grossly intact with no focal deficits. Romberg sign and cerebellar reflexes not assessed.  Psychiatric: Normal judgment and insight. Alert and oriented x 3. Slightly anxious mood and appropriate affect.   Data Reviewed: I have personally reviewed following labs and imaging studies  CBC: Recent Labs  Lab 05/11/19 0358 05/25/2019 1855 05/17/19 0608  WBC 12.6* 15.4* 16.0*  NEUTROABS  --  12.4* 13.7*  HGB 8.6* 9.0* 9.1*  HCT 28.5* 29.3* 29.9*  MCV 94.1 91.3 92.3  PLT 250 358 953   Basic Metabolic Panel: Recent Labs  Lab 05/11/19 0358  05/20/2019 1855 05/15/2019 2100 05/17/19 0608  NA 136 135 135 136  K 3.4* 2.9* 2.7* 3.0*  CL 106 101 102 102  CO2 21* '23 24 23  '$ GLUCOSE 142* 125* 145* 137*  BUN '14 19 17 11  '$ CREATININE 0.59 0.67 0.63 0.69  CALCIUM 9.5 10.2 10.0 9.4  MG  --   --   --  2.0   GFR: Estimated Creatinine Clearance: 66.3 mL/min (by C-G formula based on SCr of 0.69 mg/dL). Liver Function Tests: Recent Labs  Lab 05/03/2019 2100  AST 34  ALT 58*  ALKPHOS 114  BILITOT 0.5  PROT 6.6  ALBUMIN 2.8*   No results for input(s): LIPASE, AMYLASE in the last 168 hours. No results  for input(s): AMMONIA in the last 168 hours. Coagulation Profile: Recent Labs  Lab 05/19/2019 2100  INR 1.3*   Cardiac Enzymes: No results for input(s): CKTOTAL, CKMB, CKMBINDEX, TROPONINI in the last 168 hours. BNP (last 3 results) No results for input(s): PROBNP in the last 8760 hours. HbA1C: No results for input(s): HGBA1C in the last 72 hours. CBG: No results for input(s): GLUCAP in the last 168 hours. Lipid Profile: No results for input(s): CHOL, HDL, LDLCALC, TRIG, CHOLHDL, LDLDIRECT in the last 72 hours. Thyroid Function Tests: No results for input(s): TSH, T4TOTAL, FREET4, T3FREE, THYROIDAB in the last 72 hours. Anemia Panel: No results for input(s): VITAMINB12, FOLATE, FERRITIN, TIBC, IRON, RETICCTPCT in the last 72 hours. Sepsis Labs: Recent Labs  Lab 05/03/2019 1855  LATICACIDVEN 1.3    Recent Results (from the past 240 hour(s))  Urine culture     Status: Abnormal   Collection Time: 05/09/19 10:21 PM   Specimen: In/Out Cath Urine  Result Value Ref Range Status   Specimen Description   Final    IN/OUT CATH URINE Performed at [facility], [place] [address], [place], [place] [address]    Special Requests   Final    NONE Performed at [facility], [place] [address], [place], [place] [address]    Culture >=100,000 COLONIES/mL ENTEROCOCCUS FAECALIS (A)  Final   Report Status  05/12/2019 FINAL  Final   Organism ID, Bacteria ENTEROCOCCUS FAECALIS (A)  Final      Susceptibility   Enterococcus faecalis - MIC*    AMPICILLIN <=2 SENSITIVE Sensitive     LEVOFLOXACIN 1 SENSITIVE Sensitive     NITROFURANTOIN 32 SENSITIVE Sensitive     VANCOMYCIN 1 SENSITIVE Sensitive     * >=100,000 COLONIES/mL ENTEROCOCCUS FAECALIS  Culture, blood (Routine x 2)     Status: None   Collection Time: 05/09/19 10:22 PM   Specimen: BLOOD  Result Value Ref Range Status   Specimen Description   Final    BLOOD RIGHT ANTECUBITAL Performed at [facility] [address], [place], [place] [address]    Special Requests   Final    BOTTLES DRAWN AEROBIC AND ANAEROBIC Blood Culture results may not be optimal due to an excessive volume of blood received in culture bottles Performed at [facility] [address] [place], [place] [address]    Culture   Final    NO GROWTH 5 DAYS Performed at [facility], [place] [address] [place], [place] [address]    Report Status 05/14/2019 FINAL  Final  Culture, blood (Routine x 2)     Status: None   Collection Time: 05/09/19 10:22 PM   Specimen: BLOOD  Result Value Ref Range Status   Specimen Description   Final    BLOOD BLOOD LEFT FOREARM Performed at [facility] [address], [place], [place] [address]    Special Requests   Final    BOTTLES DRAWN AEROBIC AND ANAEROBIC Blood Culture results may not be optimal due to an excessive volume of blood received in culture bottles Performed at [facility] [address] [place], [place] [address]    Culture   Final    NO GROWTH 5 DAYS Performed at [facility], [place] [address], [place], [place] [address]    Report Status 05/14/2019 FINAL  Final  SARS Coronavirus 2 by RT PCR (hospital order, performed in [facility] hospital lab) Nasopharyngeal Nasopharyngeal Swab  Status: None   Collection Time: 05/09/19 10:48  PM   Specimen: Nasopharyngeal Swab  Result Value Ref Range Status   SARS Coronavirus 2 NEGATIVE NEGATIVE Final    Comment: (NOTE) If result is NEGATIVE SARS-CoV-2 target nucleic acids are NOT DETECTED. The SARS-CoV-2 RNA is generally detectable in upper and lower  respiratory specimens during the acute phase of infection. The lowest  concentration of SARS-CoV-2 viral copies this assay can detect is 250  copies / mL. A negative result does not preclude SARS-CoV-2 infection  and should not be used as the sole basis for treatment or other  patient management decisions.  A negative result may occur with  improper specimen collection / handling, submission of specimen other  than nasopharyngeal swab, presence of viral mutation(s) within the  areas targeted by this assay, and inadequate number of viral copies  (<250 copies / mL). A negative result must be combined with clinical  observations, patient history, and epidemiological information. If result is POSITIVE SARS-CoV-2 target nucleic acids are DETECTED. The SARS-CoV-2 RNA is generally detectable in upper and lower  respiratory specimens dur ing the acute phase of infection.  Positive  results are indicative of active infection with SARS-CoV-2.  Clinical  correlation with patient history and other diagnostic information is  necessary to determine patient infection status.  Positive results do  not rule out bacterial infection or co-infection with other viruses. If result is PRESUMPTIVE POSTIVE SARS-CoV-2 nucleic acids MAY BE PRESENT.   A presumptive positive result was obtained on the submitted specimen  and confirmed on repeat testing.  While 2019 novel coronavirus  (SARS-CoV-2) nucleic acids may be present in the submitted sample  additional confirmatory testing may be necessary for epidemiological  and / or clinical management purposes  to differentiate between  SARS-CoV-2 and other Sarbecovirus currently known to infect humans.    If clinically indicated additional testing with an alternate test  methodology [phone]) is advised. The SARS-CoV-2 RNA is generally  detectable in upper and lower respiratory sp ecimens during the acute  phase of infection. The expected result is Negative. Fact Sheet for Patients:  StrictlyIdeas.no Fact Sheet for Healthcare Providers: BankingDealers.co.za This test is not yet approved or cleared by the [place] FDA and has been authorized for detection and/or diagnosis of SARS-CoV-2 by FDA under an Emergency Use Authorization (EUA).  This EUA will remain in effect (meaning this test can be used) for the duration of the COVID-19 declaration under Section 564(b)(1) of the Act, 21 U.S.C. section 360bbb-3(b)(1), unless the authorization is terminated or revoked sooner. Performed at [facility], [place] [address], [place], [place] [address]   Blood culture (routine x 2)     Status: None (Preliminary result)   Collection Time: 05/03/2019  6:55 PM   Specimen: BLOOD LEFT FOREARM  Result Value Ref Range Status   Specimen Description   Final    BLOOD LEFT FOREARM Performed at [facility] [address] [place] [address], [place], [place] [address]    Special Requests   Final    BOTTLES DRAWN AEROBIC AND ANAEROBIC Blood Culture adequate volume Performed at [facility] [address], [place], [place] [address]    Culture   Final    NO GROWTH < 12 HOURS Performed at [place] [address], [place],  [address]    Report Status PENDING  Incomplete  Blood culture (routine x 2)     Status: None (Preliminary result)  Collection Time: 05/09/2019  6:55 PM   Specimen: BLOOD LEFT FOREARM  Result Value Ref Range Status   Specimen Description   Final    BLOOD LEFT FOREARM Performed at [facility] [place] [address] [place] [address], [place], [place] [address]    Special Requests   Final     BOTTLES DRAWN AEROBIC AND ANAEROBIC Blood Culture adequate volume Performed at [facility] [address] [place], [place] [address]    Culture   Final    NO GROWTH < 12 HOURS Performed at [facility] [address], [place], [place] [address]    Report Status PENDING  Incomplete    Radiology Studies: Dg Chest 2 View  Result Date: 04/28/2019 CLINICAL DATA:  Shortness of breath. Pulmonary fibrosis. EXAM: CHEST - 2 VIEW COMPARISON:  Radiographs dated 05/09/2019 and CT scan dated 05/10/2019 FINDINGS: There are extensive bilateral peripheral hazy pulmonary infiltrates consistent with pneumonia, markedly progressed since the prior chest x-ray and chest CT. No effusions. Heart size and pulmonary vascularity are normal. No bone abnormality. IMPRESSION: Extensive bilateral peripheral hazy pulmonary infiltrates consistent with pneumonia, markedly progressed since the prior chest x-ray and chest CT scan. Electronically Signed   By: Lorriane Shire M.D.   On: 05/07/2019 18:17   Scheduled Meds:  aspirin  81 mg Oral Daily   budesonide  6 mg Oral Daily   Chlorhexidine Gluconate Cloth  6 each Topical Daily   docusate sodium  100 mg Oral BID   enoxaparin (LOVENOX) injection  40 mg Subcutaneous Q24H   levothyroxine  125 mcg Oral Q0600   mouth rinse  15 mL Mouth Rinse BID   pravastatin  20 mg Oral q1800   sodium chloride flush  3 mL Intravenous Q12H   sodium chloride flush  3 mL Intravenous Q12H   Continuous Infusions:  sodium chloride     azithromycin Stopped (05/17/19 0136)   cefTRIAXone (ROCEPHIN)  IV Stopped (05/17/19 0014)   sodium chloride Stopped (05/17/19 0308)    LOS: 1 day   Kerney Elbe, DO [facility] PAGER is on AMION  If 7PM-7AM, please contact night-coverage [email or web address] Password [email or web address] 05/17/2019, 7:20 AM

## 2019-05-17 NOTE — Progress Notes (Signed)
Pharmacy Antibiotic Note  [name] is a 73 y.o. female admitted on 05/15/2019 with pneumonia.  Pharmacy has been consulted for cefepime dosing.  Today, 05/17/19  WBC 16  SCr 0.7, CrCl ~ 66 mL/min  Tmax 101 F  Plan:  Cefepime 2 g IV q8h  Monitor renal function for any necessary dose adjustments  Height: 5\' 4"  (162.6 cm) Weight: 194 lb 3.6 oz (88.1 kg) IBW/kg (Calculated) : 54.7  Temp (24hrs), Avg:99.9 F (37.7 C), Min:99.1 F (37.3 C), Max:101 F (38.3 C)  Recent Labs  Lab 05/11/19 0358 05/05/2019 1855 05/08/2019 2100 05/17/19 0608  WBC 12.6* 15.4*  --  16.0*  CREATININE 0.59 0.67 0.63 0.69  LATICACIDVEN  --  1.3  --   --     Estimated Creatinine Clearance: 66.3 mL/min (by C-G formula based on SCr of 0.69 mg/dL).    Allergies  Allergen Reactions  . Shellfish Allergy Anaphylaxis    Lip swelling  . Codeine Other (See Comments)    nightmares  . Hydrocod Polst-Cpm Polst Er Itching  . Hydrocodone-Acetaminophen Itching and Other (See Comments)    Has to take benadryl with to tolerate  . Sulfa Antibiotics Rash  . Sulfonamide Derivatives Rash    Antimicrobials this admission: cefepime 10/21 >>  azithromycin 10/21 in ED  Dose adjustments this admission:  Microbiology results: 10/20 BCx: Pending 10/21 UCx: Sent  10/21 MRSA PCR: Negative  Thank you for allowing pharmacy to be a part of this patient's care.  Lenis Noon, PharmD 05/17/2019 3:41 PM

## 2019-05-17 NOTE — Consult Note (Signed)
NAME:  [name], MRN:  [id number], DOB:  03/31/1946, LOS: 1 ADMISSION DATE:  05/02/2019, CONSULTATION DATE:  05/17/19 REFERRING MD:  Dr. Alfredia Ferguson, CHIEF COMPLAINT:  Hypoxic Respiratory Failure    Brief History   73 y/o F who was admitted on 10/20 from home via EMS with reports of shortness of breath.  She was found to have fever, hypoxemia and concern for PNA.     History of present illness   73 y/o F who presented to [facility] on 10/20 with reports of low oxygen saturations and shortness of breath.    The patient underwent a right total knee replacement on 10/6 per Dr. Alvan Dame.  Additionally, she was recently admitted from 10/13-10/17 with concern for possible PNA.  She was treated with ceftriaxone and azithromycin.  She was COVID negative during that admission.  CTA of the chest 10/14 showed patchy diffuse bilateral airspace disease.  After discharge home, she noted that her O2 levels were running 85-88% despite increasing her oxygen.  She returns to the ER 10/20 with increased SOB and low O2 levels. Work up concerning for possible PNA.  O2 needs noted to increase to 6L.    PCCM consulted for evaluation.   Past Medical History  Pulmonary Fibrosis -1L O2 dependent prior to admit  OSA - on CPAP  R Total Knee Replacement -05/02/19  HTN HLD  Significant Hospital Events   10/20 Admit   Consults:  PCCM   Procedures:     Significant Diagnostic Tests:  CTA Chest 10/14 >> acute patchy airspace disease, favor atypical pneumonia, negative for PE  Micro Data:  COVID 10/20 >> negative  UC 10/21 >>  BCx2 10/20 >>  Sputum 10/20 >>   Antimicrobials:  Cefepime 10/20 x1 Vanco 10/20 x1  Rocephin 10/20 >>  Azithro 10/20 >>   Interim history/subjective:  Fever to 101.  Pt reports feeling weak / tired.  "I could not fight my way out of a paper bag".  RN reports pt on 6L O2 with sats 85-88%.   Objective   Blood pressure (!) 148/69, pulse 97, temperature (!) 101 F (38.3 C), temperature source  Oral, resp. rate (!) 36, height '5\' 4"'$  (1.626 m), weight 88.1 kg, SpO2 95 %.        Intake/Output Summary (Last 24 hours) at 05/17/2019 0940 Last data filed at 05/17/2019 0700 Gross per 24 hour  Intake 4098.61 ml  Output 1650 ml  Net 2448.61 ml   Filed Weights   05/17/19 0415  Weight: 88.1 kg    Examination: General: elderly female lying in bed in NAD on [place] O2 HEENT: MM pink/dry, good dentition Neuro: AAOx4, speech clear, MAE CV: s1s2 rrr, no m/r/g PULM:  Tachypnea with prolonged exp phase, O2 increased to 8L, lungs bilaterally diminished  GI: soft, bsx4 active  Extremities: warm/dry, no edema  Skin: no rashes or lesions  Resolved Hospital Problem list      Assessment & Plan:    Acute on Chronic Hypoxic Respiratory Failure  Pulmonary Fibrosis  Underlying pulmonary fibrosis of unclear etiology with new superimposed diffuse bilateral infiltrates.  Recent surgery which raises question of aspiration but radiographic review shows diffuse infiltrates.  She was discharged after 10/6 knee surgery on 1L O2.  Ddx includes - recurrent aspiration, bacterial pneumonia, HSP, pulmonary edema and progression of ILD.  Note elevated ESR 125 on admit. Doubt viral illness given multiple recent COVID screenings that are negative.  P: Additional lasix 20 mg IV x1 now Follow  I/O's Assess swallowing for aspiration > may have meal related pulmonary fatigue contributing to recurrent aspiration Wean O2 for saturations > 88%.  Allow time for activity and recovery.  Anticipate she will desaturate and take a while for recovery.   Escalate abx to nosocomial coverage given recent admits  Follow cultures  Change steroid to solumedrol 40 mg BID  Assess ECHO  Will need pulmonary follow up with Dr. Chase Caller at discharge  Send HSP, ESR  See autoimmune eval from 08/2018 (essentially neg, RA 15) Follow intermittent CXR    PCCM will continue to follow.  Thank you for the consultation.    Best practice:   Diet: Heart Healthy  Pain/Anxiety/Delirium protocol (if indicated): n/a  VAP protocol (if indicated): n/a  DVT prophylaxis: enoxaparin  GI prophylaxis: n/a  Glucose control: n/a Mobility: as tolerated  Code Status: Reviewed concept of intubation with patient and she desires no life support in the event of decline.  Will continue aggressive medical care with the hopes of recovery.  Family Communication: Husband updated at bedside 10/21.   Disposition: SDU   Labs   CBC: Recent Labs  Lab 05/11/19 0358 05/14/2019 1855 05/17/19 0608  WBC 12.6* 15.4* 16.0*  NEUTROABS  --  12.4* 13.7*  HGB 8.6* 9.0* 9.1*  HCT 28.5* 29.3* 29.9*  MCV 94.1 91.3 92.3  PLT 250 358 024    Basic Metabolic Panel: Recent Labs  Lab 05/11/19 0358 05/13/2019 1855 05/03/2019 2100 05/17/19 0608  NA 136 135 135 136  K 3.4* 2.9* 2.7* 3.0*  CL 106 101 102 102  CO2 21* '23 24 23  '$ GLUCOSE 142* 125* 145* 137*  BUN '14 19 17 11  '$ CREATININE 0.59 0.67 0.63 0.69  CALCIUM 9.5 10.2 10.0 9.4  MG  --   --   --  2.0  PHOS  --   --   --  2.4*   GFR: Estimated Creatinine Clearance: 66.3 mL/min (by C-G formula based on SCr of 0.69 mg/dL). Recent Labs  Lab 05/11/19 0358 04/27/2019 1855 05/17/19 0608  WBC 12.6* 15.4* 16.0*  LATICACIDVEN  --  1.3  --     Liver Function Tests: Recent Labs  Lab 05/08/2019 2100  AST 34  ALT 58*  ALKPHOS 114  BILITOT 0.5  PROT 6.6  ALBUMIN 2.8*   No results for input(s): LIPASE, AMYLASE in the last 168 hours. No results for input(s): AMMONIA in the last 168 hours.  ABG    Component Value Date/Time   HCO3 26.6 05/02/2019 2045   TCO2 25 07/07/2012 0653   O2SAT 36.2 05/01/2019 2045     Coagulation Profile: Recent Labs  Lab 05/03/2019 2100  INR 1.3*    Cardiac Enzymes: No results for input(s): CKTOTAL, CKMB, CKMBINDEX, TROPONINI in the last 168 hours.  HbA1C: No results found for: HGBA1C  CBG: No results for input(s): GLUCAP in the last 168 hours.  Review of Systems:  Positives in [place]  Gen: Denies fever, chills, weight change, fatigue, night sweats, generalized weakness HEENT: Denies blurred vision, double vision, hearing loss, tinnitus, sinus congestion, rhinorrhea, sore throat, neck stiffness, dysphagia PULM: Denies shortness of breath, cough, decreased saturations, sputum production, hemoptysis, wheezing CV: Denies chest pain, edema, orthopnea, paroxysmal nocturnal dyspnea, palpitations GI: Denies abdominal pain, nausea, vomiting, diarrhea, hematochezia, melena, constipation, change in bowel habits GU: Denies dysuria, hematuria, polyuria, oliguria, urethral discharge Endocrine: Denies hot or cold intolerance, polyuria, polyphagia or appetite change Derm: Denies rash, dry skin, scaling or peeling skin change Heme: Denies  easy bruising, bleeding, bleeding gums Neuro: Denies headache, numbness, weakness, slurred speech, loss of memory or consciousness   Past Medical History  She,  has a past medical history of Anxiety state, unspecified, Benign neoplasm of colon, Brain tumor ([place]), Cancer ([place]), Colon polyps, Complication of anesthesia, Complications affecting other specified body systems, hypertension, Contact lens/glasses fitting, Depressive disorder, not elsewhere classified, Dyspnea, Esophageal reflux, Esophageal ring, Family history of colon cancer (mother), Fundic gland polyps of stomach, benign, Hemorrhoids, Hiatal hernia, History of kidney stones, Hypertension, Hypothyroidism, Lymphocytic colitis, Obesity, Other and unspecified hyperlipidemia, Pneumonia (02/28/2019), Pulmonary fibrosis ([place]), and Seizures ([place]).   Surgical History    Past Surgical History:  Procedure Laterality Date  . ABDOMINAL HYSTERECTOMY    . APPENDECTOMY    . BACK SURGERY  2014   rod in lower back  . BRAIN SURGERY  1999   benign tumor  . CARPAL TUNNEL RELEASE     right  . CATARACT EXTRACTION, BILATERAL  04/2018  . CRANIOTOMY    . EYE SURGERY    . HAMMERTOE  RECONSTRUCTION WITH WEIL OSTEOTOMY  07/07/2012   Procedure: HAMMERTOE RECONSTRUCTION WITH WEIL OSTEOTOMY;  Surgeon: Wylene Simmer, MD;  Location: [place];  Service: Orthopedics;  Laterality: Left;  Left Second through [place]; Left Fifth Toe Flexor Tendon Release, and Left Second through [place] Osteotomy  . ORIF ANKLE FRACTURE Left 04/04/2015   Procedure: OPEN REDUCTION INTERNAL FIXATION (ORIF) LEFT ANKLE FRACTURE;  Surgeon: Wylene Simmer, MD;  Location: [place];  Service: Orthopedics;  Laterality: Left;  . TOTAL KNEE ARTHROPLASTY Right 05/02/2019   Procedure: TOTAL KNEE ARTHROPLASTY;  Surgeon: Paralee Cancel, MD;  Location: [facility];  Service: Orthopedics;  Laterality: Right;  70 mins     Social History   reports that she quit smoking about 8 years ago. She has a 10.00 pack-year smoking history. She has never used smokeless tobacco. She reports current alcohol use of about 7.0 standard drinks of alcohol per week. She reports that she does not use drugs.   Family History   Her family history includes Bone cancer in her maternal grandmother; Breast cancer in her mother; Colon cancer in her mother; Heart attack in her father; Lung cancer in her maternal uncle. There is no history of Stomach cancer, Pancreatic cancer, or Esophageal cancer.   Allergies Allergies  Allergen Reactions  . Shellfish Allergy Anaphylaxis    Lip swelling  . Codeine Other (See Comments)    nightmares  . Hydrocod Polst-Cpm Polst Er Itching  . Hydrocodone-Acetaminophen Itching and Other (See Comments)    Has to take benadryl with to tolerate  . Sulfa Antibiotics Rash  . Sulfonamide Derivatives Rash     Home Medications  Prior to Admission medications   Medication Sig Start Date End Date Taking? Authorizing Provider  acetaminophen (TYLENOL) 500 MG tablet Take 2 tablets (1,000 mg total) by mouth every 8 (eight) hours. 05/03/19  Yes Babish, Rodman Key, PA-C  Ascorbic Acid (VITAMIN C) 1000 MG  tablet Take 1,000 mg by mouth daily.     Yes [provider]  aspirin (ASPIRIN CHILDRENS) 81 MG chewable tablet Chew 1 tablet (81 mg total) by mouth 2 (two) times daily. Take for 4 weeks, then resume regular dose. 05/04/19 06/03/19 Yes Babish, Rodman Key, PA-C  budesonide (ENTOCORT EC) 3 MG 24 hr capsule Take 3 capsules (9 mg total) by mouth daily. Take 3,  '3mg'$  tabs in morning as needed Patient taking differently: Take 6 mg by mouth daily.  03/27/19  Yes Irene Shipper, MD  budesonide-formoterol [facility]) 160-4.5 MCG/ACT inhaler Inhale 2 puffs into the lungs 2 (two) times daily. 02/17/19  Yes Martyn Ehrich, NP  Calcium Carbonate-Vitamin D 600-400 MG-UNIT tablet Take 1 tablet by mouth daily.   Yes [provider]  cetirizine (ZYRTEC) 10 MG tablet Take 10 mg by mouth daily.   Yes [provider]  [employer] HYDROCORTISONE ACETATE 0.5 % cream APPLY TO AFFECTED AREA TOPICALLY AS NEEDED FOR HEMORRHOIDS Patient taking differently: Apply 1 application topically daily as needed (for hemorrhoids).  02/10/16  Yes Irene Shipper, MD  diphenoxylate-atropine (LOMOTIL) 2.5-0.025 MG tablet Take 1 tab by mouth as needed. Patient taking differently: Take 1-2 tablets by mouth 4 (four) times daily as needed for diarrhea or loose stools.  03/27/19  Yes Irene Shipper, MD  docusate sodium (COLACE) 100 MG capsule Take 1 capsule (100 mg total) by mouth 2 (two) times daily. 05/03/19  Yes Babish, Rodman Key, PA-C  famotidine (PEPCID) 20 MG tablet TAKE 1 TABLET BY MOUTH EVERYDAY AT BEDTIME 05/15/19  Yes Irene Shipper, MD  hydrochlorothiazide (HYDRODIURIL) 25 MG tablet Take 25 mg by mouth daily. 10/03/18  Yes [provider]  HYDROmorphone (DILAUDID) 2 MG tablet Take 1-2 tablets (2-4 mg total) by mouth every 4 (four) hours as needed for severe pain. 05/03/19  Yes Babish, Rodman Key, PA-C  Iron, Ferrous Sulfate, 325 (65 Fe) MG TABS Take 1 tablet by mouth 2 (two) times daily. 03/27/19  Yes Irene Shipper, MD   levothyroxine (SYNTHROID) 125 MCG tablet Take 125 mcg by mouth daily. 12/08/18  Yes [provider]  Melatonin 5 MG TABS Take 7.5 mg by mouth at bedtime as needed (sleep).    Yes [provider]  methocarbamol (ROBAXIN) 500 MG tablet Take 1 tablet (500 mg total) by mouth every 6 (six) hours as needed for muscle spasms. 05/03/19  Yes Babish, Rodman Key, PA-C  Omega-3 Fatty Acids (FISH OIL PO) Take 1,400 mg by mouth at bedtime.    Yes [provider]  pantoprazole (PROTONIX) 40 MG tablet TAKE 1 TABLET BY MOUTH EVERY DAY 05/15/19  Yes Irene Shipper, MD  Polyethyl Glycol-Propyl Glycol (LUBRICANT EYE DROPS) 0.4-0.3 % SOLN Place 1 drop into both eyes 3 (three) times daily as needed (dry/irritated eyes.).   Yes [provider]  pravastatin (PRAVACHOL) 20 MG tablet Take 20 mg by mouth daily. 03/25/19  Yes [provider]  sertraline (ZOLOFT) 100 MG tablet Take 150 mg by mouth daily. 11/29/18  Yes [provider]  polyethylene glycol (MIRALAX / GLYCOLAX) 17 g packet Take 17 g by mouth 2 (two) times daily. Patient not taking: Reported on 05/15/2019 05/03/19   Danae Orleans, PA-C  Respiratory Therapy Supplies (FLUTTER) DEVI 1 Device by Does not apply route as directed. 09/26/18   Martyn Ehrich, NP  Spacer/Aero-Holding Josiah Lobo DEVI 1 Device by Does not apply route as directed. 10/14/18   Brand Males, MD     Critical care time: n/a     Noe Gens, NP-C [facility] 05/17/2019, 9:40 AM

## 2019-05-18 ENCOUNTER — Inpatient Hospital Stay (HOSPITAL_COMMUNITY): Payer: Medicare Other

## 2019-05-18 DIAGNOSIS — J9621 Acute and chronic respiratory failure with hypoxia: Secondary | ICD-10-CM

## 2019-05-18 DIAGNOSIS — J189 Pneumonia, unspecified organism: Secondary | ICD-10-CM | POA: Diagnosis not present

## 2019-05-18 DIAGNOSIS — G8929 Other chronic pain: Secondary | ICD-10-CM | POA: Diagnosis not present

## 2019-05-18 DIAGNOSIS — I10 Essential (primary) hypertension: Secondary | ICD-10-CM | POA: Diagnosis not present

## 2019-05-18 LAB — IRON AND TIBC
Iron: 16 ug/dL — ABNORMAL LOW (ref 28–170)
Saturation Ratios: 7 % — ABNORMAL LOW (ref 10.4–31.8)
TIBC: 216 ug/dL — ABNORMAL LOW (ref 250–450)
UIBC: 200 ug/dL

## 2019-05-18 LAB — CBC WITH DIFFERENTIAL/PLATELET
Abs Immature Granulocytes: 0.13 10*3/uL — ABNORMAL HIGH (ref 0.00–0.07)
Basophils Absolute: 0 10*3/uL (ref 0.0–0.1)
Basophils Relative: 0 %
Eosinophils Absolute: 0 10*3/uL (ref 0.0–0.5)
Eosinophils Relative: 0 %
HCT: 29.8 % — ABNORMAL LOW (ref 36.0–46.0)
Hemoglobin: 8.9 g/dL — ABNORMAL LOW (ref 12.0–15.0)
Immature Granulocytes: 1 %
Lymphocytes Relative: 7 %
Lymphs Abs: 1.3 10*3/uL (ref 0.7–4.0)
MCH: 27.6 pg (ref 26.0–34.0)
MCHC: 29.9 g/dL — ABNORMAL LOW (ref 30.0–36.0)
MCV: 92.5 fL (ref 80.0–100.0)
Monocytes Absolute: 1.3 10*3/uL — ABNORMAL HIGH (ref 0.1–1.0)
Monocytes Relative: 7 %
Neutro Abs: 16.9 10*3/uL — ABNORMAL HIGH (ref 1.7–7.7)
Neutrophils Relative %: 85 %
Platelets: 426 10*3/uL — ABNORMAL HIGH (ref 150–400)
RBC: 3.22 MIL/uL — ABNORMAL LOW (ref 3.87–5.11)
RDW: 20.8 % — ABNORMAL HIGH (ref 11.5–15.5)
WBC: 19.6 10*3/uL — ABNORMAL HIGH (ref 4.0–10.5)
nRBC: 0 % (ref 0.0–0.2)

## 2019-05-18 LAB — COMPREHENSIVE METABOLIC PANEL
ALT: 52 U/L — ABNORMAL HIGH (ref 0–44)
AST: 32 U/L (ref 15–41)
Albumin: 2.8 g/dL — ABNORMAL LOW (ref 3.5–5.0)
Alkaline Phosphatase: 128 U/L — ABNORMAL HIGH (ref 38–126)
Anion gap: 10 (ref 5–15)
BUN: 17 mg/dL (ref 8–23)
CO2: 24 mmol/L (ref 22–32)
Calcium: 10.3 mg/dL (ref 8.9–10.3)
Chloride: 101 mmol/L (ref 98–111)
Creatinine, Ser: 0.6 mg/dL (ref 0.44–1.00)
GFR calc Af Amer: >60 mL/min (ref >60)
GFR calc non Af Amer: >60 mL/min (ref >60)
Glucose, Bld: 205 mg/dL — ABNORMAL HIGH (ref 70–99)
Potassium: 3.7 mmol/L (ref 3.5–5.1)
Sodium: 135 mmol/L (ref 135–145)
Total Bilirubin: 0.5 mg/dL (ref 0.3–1.2)
Total Protein: 7.1 g/dL (ref 6.5–8.1)

## 2019-05-18 LAB — URINE CULTURE: Culture: <10000 — AB

## 2019-05-18 LAB — D-DIMER, QUANTITATIVE: D-Dimer, Quant: 4.25 ug/mL-FEU — ABNORMAL HIGH (ref 0.00–0.50)

## 2019-05-18 LAB — FERRITIN: Ferritin: 166 ng/mL (ref 11–307)

## 2019-05-18 LAB — LEGIONELLA PNEUMOPHILA SEROGP 1 UR AG: L. pneumophila Serogp 1 Ur Ag: NEGATIVE

## 2019-05-18 LAB — PROCALCITONIN: Procalcitonin: 1.06 ng/mL

## 2019-05-18 LAB — T3: T3, Total: 62 ng/dL — ABNORMAL LOW (ref 71–180)

## 2019-05-18 LAB — LACTATE DEHYDROGENASE: LDH: 330 U/L — ABNORMAL HIGH (ref 98–192)

## 2019-05-18 LAB — SEDIMENTATION RATE: Sed Rate: 131 mm/hr — ABNORMAL HIGH (ref 0–22)

## 2019-05-18 LAB — ECHOCARDIOGRAM COMPLETE
Height: 64 in
Weight: 3107.6 oz

## 2019-05-18 LAB — VITAMIN B12: Vitamin B-12: 229 pg/mL (ref 180–914)

## 2019-05-18 LAB — FOLATE: Folate: 8.8 ng/mL (ref >5.9)

## 2019-05-18 LAB — C-REACTIVE PROTEIN: CRP: 27.9 mg/dL — ABNORMAL HIGH (ref <1.0)

## 2019-05-18 LAB — MAGNESIUM: Magnesium: 2.3 mg/dL (ref 1.7–2.4)

## 2019-05-18 LAB — RETICULOCYTES
Immature Retic Fract: 26.5 % — ABNORMAL HIGH (ref 2.3–15.9)
RBC.: 3.22 MIL/uL — ABNORMAL LOW (ref 3.87–5.11)
Retic Count, Absolute: 52.8 10*3/uL (ref 19.0–186.0)
Retic Ct Pct: 1.6 % (ref 0.4–3.1)

## 2019-05-18 LAB — GLUCOSE, CAPILLARY: Glucose-Capillary: 173 mg/dL — ABNORMAL HIGH (ref 70–99)

## 2019-05-18 LAB — PHOSPHORUS: Phosphorus: 2.8 mg/dL (ref 2.5–4.6)

## 2019-05-18 LAB — FIBRINOGEN: Fibrinogen: >800 mg/dL — ABNORMAL HIGH (ref 210–475)

## 2019-05-18 MED ORDER — BUDESONIDE 0.5 MG/2ML IN SUSP
0.5000 mg | Freq: Two times a day (BID) | RESPIRATORY_TRACT | Status: DC
  Administered 2019-05-18 – 2019-05-21 (×7): 0.5 mg via RESPIRATORY_TRACT
  Filled 2019-05-18 (×6): qty 2

## 2019-05-18 MED ORDER — METHYLPREDNISOLONE SODIUM SUCC 125 MG IJ SOLR
125.0000 mg | INTRAMUSCULAR | Status: DC
  Administered 2019-05-18 – 2019-05-21 (×17): 125 mg via INTRAVENOUS
  Filled 2019-05-18 (×17): qty 2

## 2019-05-18 MED ORDER — ARFORMOTEROL TARTRATE 15 MCG/2ML IN NEBU
15.0000 ug | INHALATION_SOLUTION | Freq: Two times a day (BID) | RESPIRATORY_TRACT | Status: DC
  Administered 2019-05-18 – 2019-05-21 (×7): 15 ug via RESPIRATORY_TRACT
  Filled 2019-05-18 (×6): qty 2

## 2019-05-18 MED ORDER — POTASSIUM CHLORIDE CRYS ER 20 MEQ PO TBCR
40.0000 meq | EXTENDED_RELEASE_TABLET | Freq: Two times a day (BID) | ORAL | Status: AC
  Administered 2019-05-18 (×2): 40 meq via ORAL
  Filled 2019-05-18 (×2): qty 2

## 2019-05-18 MED ORDER — PHENOL 1.4 % MT LIQD
1.0000 | OROMUCOSAL | Status: DC | PRN
  Administered 2019-05-18 – 2019-05-19 (×2): 1 via OROMUCOSAL
  Filled 2019-05-18: qty 177

## 2019-05-18 MED ORDER — MORPHINE SULFATE (CONCENTRATE) 10 MG/0.5ML PO SOLN
10.0000 mg | ORAL | Status: DC | PRN
  Administered 2019-05-18 – 2019-05-20 (×7): 10 mg via ORAL
  Filled 2019-05-18 (×7): qty 0.5

## 2019-05-18 MED ORDER — FUROSEMIDE 10 MG/ML IJ SOLN
40.0000 mg | Freq: Two times a day (BID) | INTRAMUSCULAR | Status: AC
  Administered 2019-05-18 (×2): 40 mg via INTRAVENOUS
  Filled 2019-05-18 (×2): qty 4

## 2019-05-18 MED ORDER — MORPHINE SULFATE (PF) 2 MG/ML IV SOLN
2.0000 mg | Freq: Once | INTRAVENOUS | Status: AC
  Administered 2019-05-18: 2 mg via INTRAVENOUS
  Filled 2019-05-18: qty 1

## 2019-05-18 NOTE — Progress Notes (Signed)
SLP Cancellation Note  Patient Details Name: [name] MRN: [id number] DOB: 12-13-1945   Cancelled treatment:       Reason Eval/Treat Not Completed: Pt is currently on non re-breather and is not appropriate for a bedside swallow evaluation at this time.  Speech Therapy will continue to follow up as appropriate.     Bretta Bang, M.S., CCC-SLP Acute Rehabilitation Services  Elvia Collum [facility] 05/18/2019, 4:30 PM

## 2019-05-18 NOTE — Progress Notes (Signed)
  Echocardiogram 2D Echocardiogram has been performed.  [name] 05/18/2019, 2:18 PM

## 2019-05-18 NOTE — Progress Notes (Signed)
Husband Cheral Bay designated visitor, daughter Caryl Pina and Delma Post allowed to visit on day to day basis due to patients poor condition and poor prognosis. If patient's prognosis changes to a more positive outlook, visitor will go back to just husband Cheral Bay.

## 2019-05-18 NOTE — Progress Notes (Signed)
PROGRESS NOTE    [name]  UVO:[id number] DOB: Apr 29, 1946 DOA: 05/22/2019 PCP: Robyne Peers, MD   Brief Narrative:  HPI per Dr. Mitzi Hansen on 05/15/2019 [name] is a 73 y.o. female with medical history significant for interstitial lung disease, hypothyroidism, lymphocytic colitis, hypertension, and recent admission with pneumonia, discharged on 1 L of supplemental oxygen, now returning to the ED with chills, worsened dyspnea and cough, and increased supplemental oxygen requirement.  Patient reports that she was feeling much improved when she left the hospital on 05/13/2019 and continued to do well back at home until last night when she developed shaking chills.  She also has increase in her shortness of breath and nonproductive cough over the past 24 hours.  She denies any chest pain, leg swelling, or calf tenderness.  No one with fever or respiratory symptoms at home.  She denies abdominal pain, vomiting, or diarrhea since the recent admission.  ED Course: Upon arrival to the ED, patient is found to be febrile to 38.2 C, requiring 3 L/min of supplemental oxygen to maintain saturations in the low 90s, mildly tachypneic, and with stable blood pressure.  EKG features a sinus rhythm with a QTc interval of 550 ms.  Chest x-ray is concerning for extensive bilateral peripheral hazy infiltrates consistent with markedly progressed pneumonia.  Chemistry panel is notable for potassium of 2.9.  CBC features a leukocytosis to 15,400 and a stable normocytic anemia.  Blood cultures were collected, 3 L normal saline administered, and the patient was treated with acetaminophen, cefepime, and vancomycin.  COVID-19 testing has been ordered but not yet performed.  **Interim History Continued to be tachypneic and wearing supplemental oxygen via nasal cannula but worsened and had to be placed on a heated high flow nasal cannula with 40 L and 100% FiO2 but she requested to go back to 15 Liters +NRB.   Pulmonary was consulted for further evaluation recommendations.  SARS-CoV-2 testing was negative now being diuresed.  Antibiotics were escalated to IV Cefepime and pulmonary added adding Brovana and Pulmicort in addition to her DuoNebs.  Have also changed her IV dexamethasone to IV Solu-Medrol 40 mg twice daily.  Echocardiogram being is repeated and patient was made a DNR and has a very poor prognosis.  Assessment & Plan:   Principal Problem:   Pneumonia Active Problems:   ILD (interstitial lung disease) ([place])   Essential hypertension   Hypothyroidism   Acute on chronic respiratory failure with hypoxia (HCC)   Hypokalemia   Prolonged QT interval   Chronic pain  Sepsis 2/2 Multifocal PNA complicated in the setting of ILD and Pulmonary Fibrosis  Acute on chronic hypoxic respiratory failure now requiring 15 L of nasal cannula plus nonrebreather but being transmission to high flow nasal cannula with 40 L and 100% FiO2; -Presented with chills, increased SOB and cough, and increased supplemental O2 requirement, and is found to have marked progression in pneumonia with stable BP and reassuringly normal lactate; Continues to be Febrile, Tachycardic and Tachypenic  -Blood cultures were collected in ED and she was treated with 3 liters NS, IV Vancomycin, and  IV Cefepime; -After IVF Hydration was given Diuresis with IV Furosemide 60 milligrams and this is now been continued and she is being diuresed by pulmonary -Check COVID-19 and this was negative -Check sputum culture, strep pneumo and legionella antigens, -Checked MRSA PCR and was Negative -Continued antibiotics with Rocephin and azithromycin but pulmonary escalated to IV cefepime   -Added Combivent 1  puff q6h and C/w Albuterol Inhaler prn but this was changed to DuoNeb's and pulmonary is adding Brovana and budesonide -Added Incentive Spirometry -Start Dexamethasone 6 mg IV x10 for now and pulmonary changed this to IV Solu-Medrol 40 mg twice  daily and also added Guaifenesin 1200 mg po BID -Recent CTA PE onh 05/10/2019 showed "Acute patchy airspace disease, favor atypical pneumonia. Given there is a background of chronic interstitial lung disease acute alveolitis is also considered. No evidence for pulmonary embolism but notably limited in the upper lobes due to bolus timing." -Check Inflammatory Markers and LDH was 311, and D-Dimer was 6.18, Ferriting was 151, CRP was 26.8 and ESR was 125 -WBC was 15.4 on admission and is worsened to 19.6 -Follow Cx Data and Temperature Curve; Check Procalcitonin Level  -Pulmonary Consulted for further evaluation and assistance in Management and they are considering noncardiogenic cardiogenic pulmonary edema and obtained a echocardiogram which has been done and pending read -They are recommending an SLP evaluation to evaluate for occult aspiration and following intermittent chest x-ray. -Pulmonary also sent for a hypersensitivity panel and her autoimmune evaluation from 08/27/2018 was essentially negative -CRP remains elevated 27.9 procalcitonin was 1.06 -Pulmonary recommending weaning for O2 saturation greater than 88% and following strict I's and O's and daily weights; she is +804 mL -Patient continues to be tachypneic and Leukocytosis worsened in the setting of Steroid Demargination -Repeat CXR this AM showed  "Slight worsening of bilateral heterogeneous airspace disease since yesterday, suspicious for multifocal pneumonia. An element of superimposed pulmonary edema is also considered." -Repeat CXR in AM and Continue to Monitor Respiratory Status Carefully  Hypertension  -BP currently was 152/65 -Continue to Hold HCTZ in light of electrolyte derangements, use labetalol IVP as needed for now    Normocytic Anemia  -Hgb is 9.0 on admission; Repeat now showed a Hgb/Hct of 8.9/29.8 -Checked Anemia Panel and showed an iron level of 16, U IBC of 200, TIBC of 216, saturation ratios of 7%, ferritin  level of 166, folate of 8.8, vitamin B12 was 229 -Appears stable and continue to Monitor for S/Sx of Bleeding; Currently no overt bleeding noted  -Repeat CBC in AM   Prolonged QT interval  -QTc is 515 ms on admission  -Replace potassium to 4 and mag to 2, minimize QT-prolonging medications, and repeat EKG in am   Lymphocytic Colitis  -Continue Budesonide 6 mg po Daily and have added Dexamethazone 6 mg IV for Respiratory Issues; Defer to Pulmonary to adjust  -C/w Lomotil 1-2 tab po 4 Times Daily   Hypothyroidism -Checked TSH and was 6.067; Checked Free T4 which was 1.19 and T3 and was 62 -C/w Levothyroxine 125 mcg po Daily   HLD -C/w Pravastatin 20 mg po Daily   Obesity -Estimated body mass index is 33.34 kg/m as calculated from the following:   Height as of this encounter: '5\' 4"'$  (1.626 m).   Weight as of this encounter: 88.1 kg. -Weight Loss and Dietary Counseling given -Nutritionist consulted for further evaluation and Recc's and Nutritionist ordering Ensure Enlive BID and Magic Cup BID and Daily MVI+ Minerals   Anxiety and Depression -C/w Lorazepam 1 mg po q6hprn Anxiety   Elevated ALT -In the setting of PNA -ALT went from 58 is now 52 -If not improving or worsening will obtain a RUQ U/S and Acute Hepatitis Panel -Repeat CMP in AM   Electrolyte Abnormalities:  Hypokalemia -Patient's K+ this AM was 3.7 -Mag Level was 2.3 -Continue to Monitor and Replete as  Necessary  Hypophosphatemia -Patient's Phos Level this AM was 2.8 -Continue to Monitor and Replete as Necessary -Repeat Phos Level in AM   DVT prophylaxis: Enoxaparin 40 mg sq q24h Code Status: DO NOT RESUSCITATE  Family Communication: No family present at bedside  Disposition Plan: Remain in SDU for further evaluation and care given her worsening respiratory status  Consultants:   Pulmonary/PCCM  Procedures:  ECHOCARDIOGRAM done and pending read  Antimicrobials:  Anti-infectives (From admission,  onward)   Start     Dose/Rate Route Frequency Ordered Stop   05/17/19 1630  ceFEPIme (MAXIPIME) 2 g in sodium chloride 0.9 % 100 mL IVPB     2 g 200 mL/hr over 30 Minutes Intravenous Every 8 hours 05/17/19 1535     05/18/2019 2200  cefTRIAXone (ROCEPHIN) 2 g in sodium chloride 0.9 % 100 mL IVPB  Status:  Discontinued     2 g 200 mL/hr over 30 Minutes Intravenous Every 24 hours 05/08/2019 2133 05/17/19 1459   05/24/2019 2200  azithromycin (ZITHROMAX) 500 mg in sodium chloride 0.9 % 250 mL IVPB  Status:  Discontinued     500 mg 250 mL/hr over 60 Minutes Intravenous Every 24 hours 05/01/2019 2133 05/17/19 1459   04/30/2019 1945  vancomycin (VANCOCIN) 2,000 mg in sodium chloride 0.9 % 500 mL IVPB     2,000 mg 250 mL/hr over 120 Minutes Intravenous  Once 04/27/2019 1925 05/05/2019 2223   05/01/2019 1930  vancomycin (VANCOCIN) IVPB 1000 mg/200 mL premix  Status:  Discontinued     1,000 mg 200 mL/hr over 60 Minutes Intravenous  Once 05/19/2019 1922 04/28/2019 1925   04/28/2019 1930  ceFEPIme (MAXIPIME) 2 g in sodium chloride 0.9 % 100 mL IVPB     2 g 200 mL/hr over 30 Minutes Intravenous  Once 05/03/2019 1922 04/27/2019 2223     Subjective: Seen and examined at bedside and her respiratory status has worsened overnight and she is up on 15 L.  Nonrebreather and then will be transitioned to heated high flow nasal cannula with 40 L and 100% FiO2.  Pulmonary feels that her prognosis is very poor and recommending continuing steroids and diuresis today.  Patient states that when she is still her respiratory status is stable however when she moves she desaturates and feels extremely short of breath.  States that lorazepam is helpful.  No nausea or vomiting.  Denies any chest pain, lightheadedness or dizziness.  No other concerns or complaints at this time.  Objective: Vitals:   05/18/19 0700 05/18/19 0705 05/18/19 0832 05/18/19 1200  BP: (!) 154/96     Pulse: 80     Resp: (!) 29     Temp:  98.9 F (37.2 C)  97.9 F (36.6 C)   TempSrc:  Axillary  Axillary  SpO2: 96%  92%   Weight:      Height:        Intake/Output Summary (Last 24 hours) at 05/18/2019 1438 Last data filed at 05/18/2019 0600 Gross per 24 hour  Intake 1163.49 ml  Output 1510 ml  Net -346.51 ml   Filed Weights   05/17/19 0415  Weight: 88.1 kg   Examination: Physical Exam:  Constitutional: WN/WD obese Caucasian female in some respiratory distress and appears somewhat uncomfortable Eyes: Lids and conjunctivae normal, sclerae anicteric  ENMT: External Ears, Nose appear normal. Grossly normal hearing. Mucous membranes are moist.  Neck: Appears normal, supple, no cervical masses, normal ROM, no appreciable thyromegaly; no appreciable JVD Respiratory: Diminished to auscultation  bilaterally with coarse breath sounds, no wheezing, rales, rhonchi or crackles. Increased respiratory effort and patient  tachypenic. Wearing HHFNC at 40 Liters and 100% FiO2. Cardiovascular: RRR, no murmurs / rubs / gallops. S1 and S2 auscultated. Trace extremity edema. Abdomen: Soft, non-tender, Distended 2/2 to body habitus. Bowel sounds positive x4.  GU: Deferred. Musculoskeletal: No clubbing / cyanosis of digits/nails. No joint deformity upper and lower extremities Skin: No rashes, lesions, ulcers on a limited skin evaluation. No induration; Warm and dry.  Neurologic: CN 2-12 grossly intact with no focal deficits. Romberg sign and cerebellar reflexes not assessed.  Psychiatric: Normal judgment and insight. Alert and oriented x 3. Anxious mood and appropriate affect.   Data Reviewed: I have personally reviewed following labs and imaging studies  CBC: Recent Labs  Lab 05/26/2019 1855 05/17/19 0608 05/18/19 0209  WBC 15.4* 16.0* 19.6*  NEUTROABS 12.4* 13.7* 16.9*  HGB 9.0* 9.1* 8.9*  HCT 29.3* 29.9* 29.8*  MCV 91.3 92.3 92.5  PLT 358 376 175*   Basic Metabolic Panel: Recent Labs  Lab 05/11/2019 1855 05/26/2019 2100 05/17/19 0608 05/18/19 0209  NA 135 135  136 135  K 2.9* 2.7* 3.0* 3.7  CL 101 102 102 101  CO2 '23 24 23 24  '$ GLUCOSE 125* 145* 137* 205*  BUN '19 17 11 17  '$ CREATININE 0.67 0.63 0.69 0.60  CALCIUM 10.2 10.0 9.4 10.3  MG  --   --  2.0 2.3  PHOS  --   --  2.4* 2.8   GFR: Estimated Creatinine Clearance: 66.3 mL/min (by C-G formula based on SCr of 0.6 mg/dL). Liver Function Tests: Recent Labs  Lab 05/15/2019 2100 05/18/19 0209  AST 34 32  ALT 58* 52*  ALKPHOS 114 128*  BILITOT 0.5 0.5  PROT 6.6 7.1  ALBUMIN 2.8* 2.8*   No results for input(s): LIPASE, AMYLASE in the last 168 hours. No results for input(s): AMMONIA in the last 168 hours. Coagulation Profile: Recent Labs  Lab 05/26/2019 2100  INR 1.3*   Cardiac Enzymes: No results for input(s): CKTOTAL, CKMB, CKMBINDEX, TROPONINI in the last 168 hours. BNP (last 3 results) No results for input(s): PROBNP in the last 8760 hours. HbA1C: No results for input(s): HGBA1C in the last 72 hours. CBG: Recent Labs  Lab 05/18/19 0810  GLUCAP 173*   Lipid Profile: No results for input(s): CHOL, HDL, LDLCALC, TRIG, CHOLHDL, LDLDIRECT in the last 72 hours. Thyroid Function Tests: Recent Labs    05/17/19 1155  TSH 6.067*  FREET4 1.19*   Anemia Panel: Recent Labs    05/17/19 1155 05/18/19 0209  VITAMINB12  --  229  FOLATE  --  8.8  FERRITIN 151 166  TIBC  --  216*  IRON  --  16*  RETICCTPCT  --  1.6   Sepsis Labs: Recent Labs  Lab 05/01/2019 1855 05/18/19 0209  PROCALCITON  --  1.06  LATICACIDVEN 1.3  --     Recent Results (from the past 240 hour(s))  Urine culture     Status: Abnormal   Collection Time: 05/09/19 10:21 PM   Specimen: In/Out Cath Urine  Result Value Ref Range Status   Specimen Description   Final    IN/OUT CATH URINE Performed at [facility] [address] [place], [place] [address]    Special Requests   Final    NONE Performed at [facility], [place] [address], [place], [place] [address]     Culture >=100,000 COLONIES/mL ENTEROCOCCUS  FAECALIS (A)  Final   Report Status 05/12/2019 FINAL  Final   Organism ID, Bacteria ENTEROCOCCUS FAECALIS (A)  Final      Susceptibility   Enterococcus faecalis - MIC*    AMPICILLIN <=2 SENSITIVE Sensitive     LEVOFLOXACIN 1 SENSITIVE Sensitive     NITROFURANTOIN 32 SENSITIVE Sensitive     VANCOMYCIN 1 SENSITIVE Sensitive     * >=100,000 COLONIES/mL ENTEROCOCCUS FAECALIS  Culture, blood (Routine x 2)     Status: None   Collection Time: 05/09/19 10:22 PM   Specimen: BLOOD  Result Value Ref Range Status   Specimen Description   Final    BLOOD RIGHT ANTECUBITAL Performed at [facility] [address], [place], [place] [address]    Special Requests   Final    BOTTLES DRAWN AEROBIC AND ANAEROBIC Blood Culture results may not be optimal due to an excessive volume of blood received in culture bottles Performed at [facility] [address], [place], [place] [address]    Culture   Final    NO GROWTH 5 DAYS Performed at [facility], [place] [address], [place], [place] [address]    Report Status 05/14/2019 FINAL  Final  Culture, blood (Routine x 2)     Status: None   Collection Time: 05/09/19 10:22 PM   Specimen: BLOOD  Result Value Ref Range Status   Specimen Description   Final    BLOOD BLOOD LEFT FOREARM Performed at [facility] [address] [place], [place] [address]    Special Requests   Final    BOTTLES DRAWN AEROBIC AND ANAEROBIC Blood Culture results may not be optimal due to an excessive volume of blood received in culture bottles Performed at [facility] [address], [place], [place] [address]    Culture   Final    NO GROWTH 5 DAYS Performed at [facility], [place] [address], [place], [place] [address]    Report Status 05/14/2019 FINAL  Final  SARS Coronavirus 2 by RT PCR (hospital order, performed in [facility] hospital lab)  Nasopharyngeal Nasopharyngeal Swab     Status: None   Collection Time: 05/09/19 10:48 PM   Specimen: Nasopharyngeal Swab  Result Value Ref Range Status   SARS Coronavirus 2 NEGATIVE NEGATIVE Final    Comment: (NOTE) If result is NEGATIVE SARS-CoV-2 target nucleic acids are NOT DETECTED. The SARS-CoV-2 RNA is generally detectable in upper and lower  respiratory specimens during the acute phase of infection. The lowest  concentration of SARS-CoV-2 viral copies this assay can detect is 250  copies / mL. A negative result does not preclude SARS-CoV-2 infection  and should not be used as the sole basis for treatment or other  patient management decisions.  A negative result may occur with  improper specimen collection / handling, submission of specimen other  than nasopharyngeal swab, presence of viral mutation(s) within the  areas targeted by this assay, and inadequate number of viral copies  (<250 copies / mL). A negative result must be combined with clinical  observations, patient history, and epidemiological information. If result is POSITIVE SARS-CoV-2 target nucleic acids are DETECTED. The SARS-CoV-2 RNA is generally detectable in upper and lower  respiratory specimens dur ing the acute phase of infection.  Positive  results are indicative of active infection with SARS-CoV-2.  Clinical  correlation with patient history and other diagnostic information is  necessary to determine patient infection status.  Positive results do  not rule out bacterial infection or co-infection with other viruses. If result is PRESUMPTIVE POSTIVE SARS-CoV-2 nucleic acids MAY BE PRESENT.   A presumptive positive result was obtained on the submitted specimen  and confirmed on repeat testing.  While 2019 novel coronavirus  (SARS-CoV-2) nucleic acids may be present in the submitted sample  additional confirmatory testing may be necessary for epidemiological  and / or clinical management purposes  to  differentiate between  SARS-CoV-2 and other Sarbecovirus currently known to infect humans.  If clinically indicated additional testing with an alternate test  methodology [phone]) is advised. The SARS-CoV-2 RNA is generally  detectable in upper and lower respiratory sp ecimens during the acute  phase of infection. The expected result is Negative. Fact Sheet for Patients:  StrictlyIdeas.no Fact Sheet for Healthcare Providers: BankingDealers.co.za This test is not yet approved or cleared by the [place] FDA and has been authorized for detection and/or diagnosis of SARS-CoV-2 by FDA under an Emergency Use Authorization (EUA).  This EUA will remain in effect (meaning this test can be used) for the duration of the COVID-19 declaration under Section 564(b)(1) of the Act, 21 U.S.C. section 360bbb-3(b)(1), unless the authorization is terminated or revoked sooner. Performed at [facility], [place] [address] [place], [place] [address]   Blood culture (routine x 2)     Status: None (Preliminary result)   Collection Time: 05/13/2019  6:55 PM   Specimen: BLOOD LEFT FOREARM  Result Value Ref Range Status   Specimen Description   Final    BLOOD LEFT FOREARM Performed at [facility] [address], [place], [place] [address]    Special Requests   Final    BOTTLES DRAWN AEROBIC AND ANAEROBIC Blood Culture adequate volume Performed at [facility] [address] [place], [place] [address]    Culture   Final    NO GROWTH 2 DAYS Performed at [facility] [address] [place], [place] [address]    Report Status PENDING  Incomplete  Blood culture (routine x 2)     Status: None (Preliminary result)   Collection Time: 05/06/2019  6:55 PM   Specimen: BLOOD LEFT FOREARM  Result Value Ref Range Status   Specimen Description   Final    BLOOD LEFT FOREARM Performed at [facility] [address], [place], [place] [address]    Special Requests   Final    BOTTLES DRAWN AEROBIC AND ANAEROBIC Blood Culture adequate volume Performed at [facility] [address], [place], [place] [address]    Culture   Final    NO GROWTH 2 DAYS Performed at [facility] [address] [place], [place] [address]    Report Status PENDING  Incomplete  SARS CORONAVIRUS 2 (TAT 6-24 HRS) Nasopharyngeal Nasopharyngeal Swab     Status: None   Collection Time: 05/13/2019 11:24 PM   Specimen: Nasopharyngeal Swab  Result Value Ref Range Status   SARS Coronavirus 2 NEGATIVE NEGATIVE Final    Comment: (NOTE) SARS-CoV-2 target nucleic acids are NOT DETECTED. The SARS-CoV-2 RNA is generally detectable in upper and lower respiratory specimens during the acute phase of infection. Negative results do not preclude SARS-CoV-2 infection, do not rule out co-infections with other pathogens, and should not be used as the sole basis for treatment or other patient management decisions. Negative results must be combined with clinical observations, patient history, and epidemiological information. The  expected result is Negative. Fact Sheet for Patients: SugarRoll.be Fact Sheet for Healthcare Providers: https://www.woods-mathews.com/ This test is not yet approved or cleared by the [place] FDA and  has been authorized for detection and/or diagnosis of SARS-CoV-2 by FDA under an Emergency Use Authorization (EUA). This EUA will remain  in effect (meaning this test can be used) for the duration of the COVID-19 declaration under Section 56 4(b)(1) of the Act, 21 U.S.C. section 360bbb-3(b)(1), unless the authorization is terminated or revoked sooner. Performed at [facility], [place] [address] [place], [place] [address]   Urine culture     Status: Abnormal   Collection Time: 05/17/19  6:00 AM   Specimen:  Urine, Random  Result Value Ref Range Status   Specimen Description   Final    URINE, RANDOM Performed at [facility] [address] [place], [place] [address]    Special Requests   Final    NONE Performed at [facility], [place] [address] [place], [place] [address]    Culture (A)  Final    <10,000 COLONIES/mL INSIGNIFICANT GROWTH Performed at [facility] [address], [place], [place] [address]    Report Status 05/18/2019 FINAL  Final  MRSA PCR Screening     Status: None   Collection Time: 05/17/19  6:00 AM   Specimen: Nasopharyngeal  Result Value Ref Range Status   MRSA by PCR NEGATIVE NEGATIVE Final    Comment:        The GeneXpert MRSA Assay (FDA approved for NASAL specimens only), is one component of a comprehensive MRSA colonization surveillance program. It is not intended to diagnose MRSA infection nor to guide or monitor treatment for MRSA infections. Performed at [facility], [place] [address] [place], [place] [address]     Radiology Studies: Dg Chest 2 View  Result Date: 05/02/2019 CLINICAL DATA:  Shortness of breath. Pulmonary fibrosis. EXAM: CHEST - 2 VIEW COMPARISON:  Radiographs dated 05/09/2019 and CT scan dated 05/10/2019 FINDINGS: There are extensive bilateral peripheral hazy pulmonary infiltrates consistent with pneumonia, markedly progressed since the prior chest x-ray and chest CT. No effusions. Heart size and pulmonary vascularity are normal. No bone abnormality. IMPRESSION: Extensive bilateral peripheral hazy pulmonary infiltrates consistent with pneumonia, markedly progressed since the prior chest x-ray and chest CT scan. Electronically Signed   By: Lorriane Shire M.D.   On: 05/02/2019 18:17   Dg Chest Port 1 View  Result Date: 05/18/2019 CLINICAL DATA:  Shortness of breath. EXAM: PORTABLE CHEST 1 VIEW COMPARISON:  Radiograph yesterday. CT 05/10/2019 FINDINGS: Slight worsening of  bilateral heterogeneous airspace disease since yesterday. Unchanged heart size and mediastinal contours. No pneumothorax or large pleural effusion. Unchanged osseous structures. IMPRESSION: Slight worsening of bilateral heterogeneous airspace disease since yesterday, suspicious for multifocal pneumonia. An element of superimposed pulmonary edema is also considered. Electronically Signed   By: Keith Rake M.D.   On: 05/18/2019 05:49   Dg Chest Port 1 View  Result Date: 05/17/2019 CLINICAL DATA:  Shortness of breath. EXAM: PORTABLE CHEST 1 VIEW COMPARISON:  May 16, 2019. FINDINGS: Stable cardiomediastinal silhouette. Stable diffuse bilateral lung opacities are noted concerning for multifocal pneumonia. No pneumothorax or significant pleural effusion is noted. Bony thorax unremarkable. IMPRESSION: Stable diffuse bilateral lung opacities are noted concerning for multifocal pneumonia. Electronically Signed   By: Marijo Conception M.D.   On: 05/17/2019 10:54   Scheduled Meds: . arformoterol  15 mcg Nebulization BID  .  aspirin  81 mg Oral Daily  . budesonide  6 mg Oral Daily  . budesonide (PULMICORT) nebulizer solution  0.5 mg Nebulization BID  . Chlorhexidine Gluconate Cloth  6 each Topical Daily  . docusate sodium  100 mg Oral BID  . enoxaparin (LOVENOX) injection  40 mg Subcutaneous Q24H  . feeding supplement (ENSURE ENLIVE)  237 mL Oral BID BM  . furosemide  40 mg Intravenous BID  . guaiFENesin  1,200 mg Oral BID  . ipratropium-albuterol  3 mL Nebulization Q6H  . levothyroxine  125 mcg Oral Q0600  . mouth rinse  15 mL Mouth Rinse BID  . methylPREDNISolone (SOLU-MEDROL) injection  125 mg Intravenous Q4H  . multivitamin with minerals  1 tablet Oral Daily  . potassium chloride  40 mEq Oral BID  . pravastatin  20 mg Oral q1800  . sodium chloride flush  3 mL Intravenous Q12H  . sodium chloride flush  3 mL Intravenous Q12H   Continuous Infusions: . sodium chloride    . ceFEPime (MAXIPIME)  IV Stopped (05/18/19 1421)  . sodium chloride Stopped (05/17/19 0308)    LOS: 2 days   Kerney Elbe, DO [facility] PAGER is on [place]  If 7PM-7AM, please contact night-coverage [email or web address] Password [facility] 05/18/2019, 2:38 PM

## 2019-05-18 NOTE — Progress Notes (Addendum)
NAME:  [name], MRN:  [id number], DOB:  1945-09-28, LOS: 2 ADMISSION DATE:  05/18/2019, CONSULTATION DATE:  05/17/19 REFERRING MD:  Dr. Alfredia Ferguson, CHIEF COMPLAINT:  Hypoxic Respiratory Failure    Brief History    73 y/o F who presented to [facility] on 10/20 with reports of low oxygen saturations and shortness of breath.    The patient underwent a right total knee replacement on 10/6 per Dr. Alvan Dame.  Additionally, she was recently admitted from 10/13-10/17 with concern for possible PNA.  She was treated with ceftriaxone and azithromycin.  She was COVID negative during that admission.  CTA of the chest 10/14 showed patchy diffuse bilateral airspace disease.  After discharge home, she noted that her O2 levels were running 85-88% despite increasing her oxygen.  She returns to the ER 10/20 with increased SOB and low O2 levels. Work up concerning for possible PNA.  O2 needs noted to increase to 6L.    PCCM consulted for evaluation.   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  Afebrile.  Pt reports she is not comfortable on heated high flow > too loud, requesting to go back to [place] / NRB. I/O- 2.3L UOP in last 24 hours, neg ~ 1L negative in 24h.   Objective   Blood pressure (!) 154/96, pulse 80, temperature 98.9 F (37.2 C), temperature source  Axillary, resp. rate (!) 29, height '5\' 4"'$  (1.626 m), weight 88.1 kg, SpO2 92 %.    FiO2 (%):  [100 %] 100 %   Intake/Output Summary (Last 24 hours) at 05/18/2019 1012 Last data filed at 05/18/2019 0600 Gross per 24 hour  Intake 1403.49 ml  Output 2360 ml  Net -956.51 ml   Filed Weights   05/17/19 0415  Weight: 88.1 kg    Examination: General: elderly female lying in bed, NAD HEENT: MM pink/moist, HFNC in place Neuro: AAOx4, speech clear, MAE CV: s1s2 rrr, SEM PULM:  Prolonged exp phase, wheezing bilaterally, fine crackles 2/3 way up anterior GI: soft, bsx4 active  Extremities: warm/dry, no edema  Skin: no rashes or lesions  Resolved Hospital Problem list      Assessment & Plan:    Acute on Chronic Hypoxic Respiratory Failure  Pulmonary Fibrosis  Underlying pulmonary fibrosis of unclear etiology with new superimposed diffuse bilateral infiltrates.  Recent surgery which raises question of aspiration but radiographic review shows diffuse infiltrates.  She was discharged after 10/6 knee surgery on 1L O2.  Ddx includes - recurrent aspiration, bacterial pneumonia, HSP, pulmonary edema and progression of ILD.  Note elevated ESR 125 on admit. Doubt viral illness given multiple recent COVID screenings that are negative. Autoimmune panel from 08/2018 essentially negative (RA 15 / slightly elevated).  PCT elevated, strep antigen negative.  P: Lasix 40 mg  IV BID x2 doses with KCL Follow I/O's  Wean O2 for sats >88%.  Anticipate she will have episodes of desaturations with slow recovery Continue nosocomial coverage abx Duoneb Q6  Add brovana + pulmicort  Follow cultures, HSP Continue solumedrol 40 mg IV BID  Follow intermittent CXR  Concern this may be an acute fibrotic flare, hopeful she will respond to steroids  Await ECHO findings > may be limited imaging with fibrosis  DNR established / no intubation   Best practice:  Diet: Heart Healthy  Pain/Anxiety/Delirium protocol (if  indicated): n/a  VAP protocol (if indicated): n/a  DVT prophylaxis: enoxaparin  GI prophylaxis: n/a  Glucose control: n/a Mobility: as tolerated  Code Status: Reviewed concept of intubation with patient and she desires no life support in the event of decline.  Will continue aggressive medical care with the hopes of recovery. DNR placed.  Husband Antony Haste) updated on patients request 10/21 afternoon.  Family Communication: Husband updated 10/22 outside room.  Reviewed concern that patient has worsened despite maximum therapy overnight. He understands and raises questions about what therapy would look like if she declines.  We discussed providing hope for the patient.  He believes she understands she is not doing well.  Will continue to support patient and family.    Disposition: SDU   Labs   CBC: Recent Labs  Lab 05/04/2019 1855 05/17/19 0608 05/18/19 0209  WBC 15.4* 16.0* 19.6*  NEUTROABS 12.4* 13.7* 16.9*  HGB 9.0* 9.1* 8.9*  HCT 29.3* 29.9* 29.8*  MCV 91.3 92.3 92.5  PLT 358 376 426*    Basic Metabolic Panel: Recent Labs  Lab 05/27/2019 1855 05/02/2019 2100 05/17/19 0608 05/18/19 0209  NA 135 135 136 135  K 2.9* 2.7* 3.0* 3.7  CL 101 102 102 101  CO2 '23 24 23 24  '$ GLUCOSE 125* 145* 137* 205*  BUN '19 17 11 17  '$ CREATININE 0.67 0.63 0.69 0.60  CALCIUM 10.2 10.0 9.4 10.3  MG  --   --  2.0 2.3  PHOS  --   --  2.4* 2.8   GFR: Estimated Creatinine Clearance: 66.3 mL/min (by C-G formula based on SCr of 0.6 mg/dL). Recent Labs  Lab 05/24/2019 1855 05/17/19 0608 05/18/19 0209  PROCALCITON  --   --  1.06  WBC 15.4* 16.0* 19.6*  LATICACIDVEN 1.3  --   --     Liver Function Tests: Recent Labs  Lab 05/02/2019 2100 05/18/19 0209  AST 34 32  ALT 58* 52*  ALKPHOS 114 128*  BILITOT 0.5 0.5  PROT 6.6 7.1  ALBUMIN 2.8* 2.8*   No results for input(s): LIPASE, AMYLASE in the last 168 hours. No results for input(s): AMMONIA in the last 168 hours.  ABG    Component Value Date/Time    HCO3 26.6 05/07/2019 2045   TCO2 25 07/07/2012 0653   O2SAT 36.2 04/29/2019 2045     Coagulation Profile: Recent Labs  Lab 05/07/2019 2100  INR 1.3*    Cardiac Enzymes: No results for input(s): CKTOTAL, CKMB, CKMBINDEX, TROPONINI in the last 168 hours.  HbA1C: No results found for: HGBA1C  CBG: Recent Labs  Lab 05/18/19 0810  GLUCAP 173*    Critical care time: n/a     Noe Gens, NP-C [facility] 05/18/2019, 10:12 AM

## 2019-05-18 NOTE — Progress Notes (Addendum)
Pt unable to wear CPAP qhs tonight due to O2 requirement.  Tried Pt on CPAP with 15Lpm O2 bleed in but Pt could not maintain SpO2.  Pt currently wearing 15Lpm salter HFNC and partial-rebreather.   RT will continue to monitor as needed.

## 2019-05-19 ENCOUNTER — Inpatient Hospital Stay (HOSPITAL_COMMUNITY): Payer: Medicare Other

## 2019-05-19 DIAGNOSIS — R0600 Dyspnea, unspecified: Secondary | ICD-10-CM | POA: Diagnosis not present

## 2019-05-19 DIAGNOSIS — I10 Essential (primary) hypertension: Secondary | ICD-10-CM | POA: Diagnosis not present

## 2019-05-19 DIAGNOSIS — J189 Pneumonia, unspecified organism: Secondary | ICD-10-CM | POA: Diagnosis not present

## 2019-05-19 DIAGNOSIS — Z515 Encounter for palliative care: Secondary | ICD-10-CM | POA: Diagnosis not present

## 2019-05-19 DIAGNOSIS — G8929 Other chronic pain: Secondary | ICD-10-CM | POA: Diagnosis not present

## 2019-05-19 DIAGNOSIS — J849 Interstitial pulmonary disease, unspecified: Secondary | ICD-10-CM | POA: Diagnosis not present

## 2019-05-19 DIAGNOSIS — J9621 Acute and chronic respiratory failure with hypoxia: Secondary | ICD-10-CM | POA: Diagnosis not present

## 2019-05-19 LAB — COMPREHENSIVE METABOLIC PANEL
ALT: 56 U/L — ABNORMAL HIGH (ref 0–44)
AST: 40 U/L (ref 15–41)
Albumin: 2.6 g/dL — ABNORMAL LOW (ref 3.5–5.0)
Alkaline Phosphatase: 121 U/L (ref 38–126)
Anion gap: 10 (ref 5–15)
BUN: 23 mg/dL (ref 8–23)
CO2: 24 mmol/L (ref 22–32)
Calcium: 11 mg/dL — ABNORMAL HIGH (ref 8.9–10.3)
Chloride: 100 mmol/L (ref 98–111)
Creatinine, Ser: 0.72 mg/dL (ref 0.44–1.00)
GFR calc Af Amer: >60 mL/min (ref >60)
GFR calc non Af Amer: >60 mL/min (ref >60)
Glucose, Bld: 256 mg/dL — ABNORMAL HIGH (ref 70–99)
Potassium: 4.5 mmol/L (ref 3.5–5.1)
Sodium: 134 mmol/L — ABNORMAL LOW (ref 135–145)
Total Bilirubin: 0.4 mg/dL (ref 0.3–1.2)
Total Protein: 6.7 g/dL (ref 6.5–8.1)

## 2019-05-19 LAB — CBC WITH DIFFERENTIAL/PLATELET
Abs Immature Granulocytes: 0.15 10*3/uL — ABNORMAL HIGH (ref 0.00–0.07)
Basophils Absolute: 0 10*3/uL (ref 0.0–0.1)
Basophils Relative: 0 %
Eosinophils Absolute: 0 10*3/uL (ref 0.0–0.5)
Eosinophils Relative: 0 %
HCT: 29 % — ABNORMAL LOW (ref 36.0–46.0)
Hemoglobin: 8.6 g/dL — ABNORMAL LOW (ref 12.0–15.0)
Immature Granulocytes: 1 %
Lymphocytes Relative: 4 %
Lymphs Abs: 0.8 10*3/uL (ref 0.7–4.0)
MCH: 27.4 pg (ref 26.0–34.0)
MCHC: 29.7 g/dL — ABNORMAL LOW (ref 30.0–36.0)
MCV: 92.4 fL (ref 80.0–100.0)
Monocytes Absolute: 0.8 10*3/uL (ref 0.1–1.0)
Monocytes Relative: 4 %
Neutro Abs: 18.5 10*3/uL — ABNORMAL HIGH (ref 1.7–7.7)
Neutrophils Relative %: 91 %
Platelets: 419 10*3/uL — ABNORMAL HIGH (ref 150–400)
RBC: 3.14 MIL/uL — ABNORMAL LOW (ref 3.87–5.11)
RDW: 21 % — ABNORMAL HIGH (ref 11.5–15.5)
WBC: 20.3 10*3/uL — ABNORMAL HIGH (ref 4.0–10.5)
nRBC: 0 % (ref 0.0–0.2)

## 2019-05-19 LAB — GLUCOSE, CAPILLARY: Glucose-Capillary: 205 mg/dL — ABNORMAL HIGH (ref 70–99)

## 2019-05-19 LAB — PHOSPHORUS: Phosphorus: 2.8 mg/dL (ref 2.5–4.6)

## 2019-05-19 LAB — MAGNESIUM: Magnesium: 2.3 mg/dL (ref 1.7–2.4)

## 2019-05-19 LAB — PROCALCITONIN: Procalcitonin: 0.55 ng/mL

## 2019-05-19 MED ORDER — HYDROMORPHONE HCL 1 MG/ML IJ SOLN: 1.0000 mg | INTRAMUSCULAR | Status: DC | PRN

## 2019-05-19 MED ORDER — BENZONATATE 100 MG PO CAPS
200.0000 mg | ORAL_CAPSULE | Freq: Three times a day (TID) | ORAL | Status: DC
  Administered 2019-05-19 – 2019-05-20 (×3): 200 mg via ORAL
  Filled 2019-05-19 (×3): qty 2

## 2019-05-19 MED ORDER — ALUM & MAG HYDROXIDE-SIMETH 200-200-20 MG/5ML PO SUSP
30.0000 mL | ORAL | Status: DC | PRN
  Administered 2019-05-19: 30 mL via ORAL
  Filled 2019-05-19: qty 30

## 2019-05-19 MED ORDER — LORAZEPAM 2 MG/ML IJ SOLN
1.0000 mg | INTRAMUSCULAR | Status: DC | PRN
  Administered 2019-05-20 – 2019-05-21 (×2): 1 mg via INTRAVENOUS
  Filled 2019-05-19 (×2): qty 1

## 2019-05-19 MED ORDER — FUROSEMIDE 10 MG/ML IJ SOLN
40.0000 mg | Freq: Once | INTRAMUSCULAR | Status: AC
  Administered 2019-05-19: 40 mg via INTRAVENOUS
  Filled 2019-05-19: qty 4

## 2019-05-19 MED ORDER — LORAZEPAM 1 MG PO TABS
1.0000 mg | ORAL_TABLET | ORAL | Status: DC | PRN
  Administered 2019-05-19 (×2): 1 mg via ORAL
  Filled 2019-05-19 (×2): qty 1

## 2019-05-19 MED ORDER — POTASSIUM CHLORIDE CRYS ER 20 MEQ PO TBCR
40.0000 meq | EXTENDED_RELEASE_TABLET | Freq: Once | ORAL | Status: AC
  Administered 2019-05-19: 40 meq via ORAL
  Filled 2019-05-19: qty 2

## 2019-05-19 NOTE — Progress Notes (Signed)
Pharmacy Antibiotic Note  [name] is a 73 y.o. female admitted on 05/24/2019 with pneumonia.  Pharmacy has been consulted for cefepime dosing.  Today, 05/19/19  WBC 20.3 (on solu-medrol)  SCr 0.7, CrCl ~ 66 mL/min, stable  afebrile  Plan:  Continue Cefepime 2 g IV q8h  Pharmacy will sign off and follow peripherally for any necessary dose adjustments  Height: 5\' 4"  (162.6 cm) Weight: 194 lb 3.6 oz (88.1 kg) IBW/kg (Calculated) : 54.7  Temp (24hrs), Avg:98.2 F (36.8 C), Min:97.2 F (36.2 C), Max:98.9 F (37.2 C)  Recent Labs  Lab 04/27/2019 1855 05/15/2019 2100 05/17/19 0608 05/18/19 0209 05/19/19 0215  WBC 15.4*  --  16.0* 19.6* 20.3*  CREATININE 0.67 0.63 0.69 0.60 0.72  LATICACIDVEN 1.3  --   --   --   --     Estimated Creatinine Clearance: 66.3 mL/min (by C-G formula based on SCr of 0.72 mg/dL).    Allergies  Allergen Reactions  . Shellfish Allergy Anaphylaxis    Lip swelling  . Codeine Other (See Comments)    nightmares  . Hydrocod Polst-Cpm Polst Er Itching  . Hydrocodone-Acetaminophen Itching and Other (See Comments)    Has to take benadryl with to tolerate  . Sulfa Antibiotics Rash  . Sulfonamide Derivatives Rash    Antimicrobials this admission: cefepime 10/21 >>  azithromycin 10/21 in ED  Dose adjustments this admission:  Microbiology results: 10/20 BCx: NGTD 10/21 UCx: insignificant growth 10/21 MRSA PCR: Negative  Thank you for allowing pharmacy to be a part of this patient's care.  Dolly Rias RPh 05/19/2019, 9:20 AM Pager [phone]

## 2019-05-19 NOTE — Progress Notes (Signed)
PROGRESS NOTE    [name]  ZES:[id number] DOB: 1946-04-30 DOA: 05/19/2019 PCP: Robyne Peers, MD   Brief Narrative:  HPI per Dr. Mitzi Hansen on 05/19/2019 [name] is a 73 y.o. female with medical history significant for interstitial lung disease, hypothyroidism, lymphocytic colitis, hypertension, and recent admission with pneumonia, discharged on 1 L of supplemental oxygen, now returning to the ED with chills, worsened dyspnea and cough, and increased supplemental oxygen requirement.  Patient reports that she was feeling much improved when she left the hospital on 05/13/2019 and continued to do well back at home until last night when she developed shaking chills.  She also has increase in her shortness of breath and nonproductive cough over the past 24 hours.  She denies any chest pain, leg swelling, or calf tenderness.  No one with fever or respiratory symptoms at home.  She denies abdominal pain, vomiting, or diarrhea since the recent admission.  ED Course: Upon arrival to the ED, patient is found to be febrile to 38.2 C, requiring 3 L/min of supplemental oxygen to maintain saturations in the low 90s, mildly tachypneic, and with stable blood pressure.  EKG features a sinus rhythm with a QTc interval of 550 ms.  Chest x-ray is concerning for extensive bilateral peripheral hazy infiltrates consistent with markedly progressed pneumonia.  Chemistry panel is notable for potassium of 2.9.  CBC features a leukocytosis to 15,400 and a stable normocytic anemia.  Blood cultures were collected, 3 L normal saline administered, and the patient was treated with acetaminophen, cefepime, and vancomycin.  COVID-19 testing has been ordered but not yet performed.  **Interim History Continued to be tachypneic and wearing supplemental oxygen via nasal cannula but worsened and had to be placed on a heated high flow nasal cannula with 40 L and 100% FiO2 but she requested to go back to 15 Liters +NRB and  has been desaturating even on that.  Pulmonary was consulted for further evaluation recommendations.  SARS-CoV-2 testing was negative now being diuresed and given IV 40 mg Lasix this AM.  Antibiotics were escalated to IV Cefepime and pulmonary added adding Brovana and Pulmicort in addition to her DuoNebs.  Have also changed her IV dexamethasone to IV Solu-Medrol and now on 125 mg q4h x3 Days.  Echocardiogram was repeated and patient was made a DNR and has a very poor prognosis.  Continues to be dyspneic so morphine was added in addition to her Ativan and palliative care was consulted for further goals of care discussion and symptom management.  Assessment & Plan:   Principal Problem:   Pneumonia Active Problems:   ILD (interstitial lung disease) ([facility])   Essential hypertension   Hypothyroidism   Acute on chronic respiratory failure with hypoxia (HCC)   Hypokalemia   Prolonged QT interval   Chronic pain  Sepsis 2/2 Multifocal PNA complicated in the setting of ILD and Pulmonary Fibrosis  Acute on chronic hypoxic respiratory failure now requiring 15 L of nasal cannula plus nonrebreather -Presented with chills, increased SOB and cough, and increased supplemental O2 requirement, and is found to have marked progression in pneumonia with stable BP and reassuringly normal lactate; Continues to be Febrile, Tachycardic and Tachypenic  -Blood cultures were collected in ED and she was treated with 3 liters NS, IV Vancomycin, and  IV Cefepime; -After IVF Hydration was given Diuresis with IV Furosemide 60 milligrams and this is now been continued and she is being diuresed by pulmonary and received 40 mg today -  Check COVID-19 and this was negative -Check sputum culture, strep pneumo and legionella antigens, -Checked MRSA PCR and was Negative -Continued antibiotics with Rocephin and azithromycin but pulmonary escalated to IV cefepime   -Added Combivent 1 puff q6h and C/w Albuterol Inhaler prn but this was  changed to DuoNeb's and pulmonary is adding Brovana and budesonide -Added Incentive Spirometry -Start Dexamethasone 6 mg IV x10 for now and pulmonary changed this to IV Solu-Medrol 40 mg twice daily but then further increased to flare dosing of ILD and she is now on 125 mg every 4 scheduled for 3 days and also added Guaifenesin 1200 mg po BID -Recent CTA PE onh 05/10/2019 showed "Acute patchy airspace disease, favor atypical pneumonia. Given there is a background of chronic interstitial lung disease acute alveolitis is also considered. No evidence for pulmonary embolism but notably limited in the upper lobes due to bolus timing." -Check Inflammatory Markers and LDH was 311, and D-Dimer was 6.18, Ferriting was 151, CRP was 26.8 and ESR was 125 -WBC was 15.4 on admission and is worsened to 20.3 -Follow Cx Data and Temperature Curve; Check Procalcitonin Level  -Pulmonary Consulted for further evaluation and assistance in Management and they are considering noncardiogenic cardiogenic pulmonary edema and obtained a echocardiogram which has been done and showed an EF of 65 to 56% and diastolic dysfunction -They are recommending an SLP evaluation to evaluate for occult aspiration and following intermittent chest x-ray. -Pulmonary also sent for a hypersensitivity panel and her autoimmune evaluation from 08/27/2018 was essentially negative -CRP remains elevated 27.9 procalcitonin was 1.06 and is now 0.55 -Pulmonary recommending weaning for O2 saturation greater than 88% and following strict I's and O's and daily weights; she is -975 mL -Patient continues to be tachypneic and Leukocytosis worsened in the setting of Steroid Demargination -Repeat CXR this AM showed  "Patchy multifocal infiltrates slightly improved when compared with the prior exam." -Patient was continuing to desaturate today especially on 15 L of nonrebreather and had saturations in the low 80s and a difficult time recovering -Repeat CXR in AM and  Continue to Monitor Respiratory Status Carefully -Continue with morphine and Ativan for increased work of breathing -Pulmonary recommending aggressive treatment for the next several days but if she does not improve they are recommending a possible transition to a palliative approach as patient does not want intubation or CPR and Dr. Lamonte Sakai discussed this with the patient's family  Hypertension  -BP currently was 141/68 -Continue to Hold HCTZ in light of electrolyte derangements, use labetalol IVP as needed for now    Normocytic Anemia  -Hgb is 9.0 on admission; Repeat now showed a Hgb/Hct of 8.6/29.0 -Checked Anemia Panel and showed an iron level of 16, U IBC of 200, TIBC of 216, saturation ratios of 7%, ferritin level of 166, folate of 8.8, vitamin B12 was 229 -Appears stable and continue to Monitor for S/Sx of Bleeding; Currently no overt bleeding noted  -Repeat CBC in AM   Prolonged QT interval  -QTc is 515 ms on admission  -Replace potassium to 4 and mag to 2, minimize QT-prolonging medications, and repeat EKG in am  as last EKG showed a QTC of 457  Lymphocytic Colitis  -Continue Budesonide 6 mg po Daily and have added Dexamethazone 6 mg IV for Respiratory Issues; Defer to Pulmonary to adjust  -C/w Lomotil 1-2 tab po 4 Times Daily   Hypothyroidism -Checked TSH and was 6.067; Checked Free T4 which was 1.19 and T3 and was 62 -C/w  Levothyroxine 125 mcg po Daily   HLD -C/w Pravastatin 20 mg po Daily   Obesity -Estimated body mass index is 33.34 kg/m as calculated from the following:   Height as of this encounter: '5\' 4"'$  (1.626 m).   Weight as of this encounter: 88.1 kg. -Weight Loss and Dietary Counseling given -Nutritionist consulted for further evaluation and Recc's and Nutritionist ordering Ensure Enlive BID and Magic Cup BID and Daily MVI+ Minerals   Anxiety and Depression -C/w Lorazepam 1 mg po q6hprn Anxiety   Elevated ALT -In the setting of PNA -ALT went from 58  -> 52 -> 56 -If not improving or worsening will obtain a RUQ U/S and Acute Hepatitis Panel -Repeat CMP in AM   Electrolyte Abnormalities:  Hypokalemia -Patient's K+ this AM was 4.5 -Mag Level was 2.3 -Continue to Monitor and Replete as Necessary  Hypophosphatemia -Patient's Phos Level this AM was 2.8 -Continue to Monitor and Replete as Necessary -Repeat Phos Level in AM   DVT prophylaxis: Enoxaparin 40 mg sq q24h Code Status: DO NOT RESUSCITATE  Family Communication: No family present at bedside  Disposition Plan: Remain in SDU for further evaluation and care given her worsening respiratory status continued desaturations  Consultants:   Pulmonary/PCCM  Pallative Care  Procedures:  ECHOCARDIOGRAM IMPRESSIONS    1. Left ventricular ejection fraction, by visual estimation, is 65 to 70%. The left ventricle has hyperdynamic function. There is mildly increased left ventricular hypertrophy.  2. Elevated left atrial and left ventricular end-diastolic pressures.  3. Left ventricular diastolic Doppler parameters are consistent with impaired relaxation pattern of LV diastolic filling.  4. Global right ventricle has normal systolic function.The right ventricular size is normal. No increase in right ventricular wall thickness.  5. Left atrial size was normal.  6. Right atrial size was normal.  7. Trivial pericardial effusion is present.  8. Mild mitral annular calcification.  9. The mitral valve is abnormal. Trace mitral valve regurgitation. 10. The tricuspid valve is grossly normal. Tricuspid valve regurgitation is trivial. 11. The aortic valve is tricuspid Aortic valve regurgitation was not visualized by color flow Doppler. Mild aortic valve sclerosis without stenosis. 12. The pulmonic valve was grossly normal. Pulmonic valve regurgitation is not visualized by color flow Doppler. 13. The inferior vena cava is dilated in size with >50% respiratory variability, suggesting right  atrial pressure of 8 mmHg.  FINDINGS  Left Ventricle: Left ventricular ejection fraction, by visual estimation, is 65 to 70%. The left ventricle has hyperdynamic function. There is mildly increased left ventricular hypertrophy. Spectral Doppler shows Left ventricular diastolic Doppler  parameters are consistent with impaired relaxation pattern of LV diastolic filling. Elevated left atrial and left ventricular end-diastolic pressures.  Right Ventricle: The right ventricular size is normal. No increase in right ventricular wall thickness. Global RV systolic function is has normal systolic function.  Left Atrium: Left atrial size was normal in size.  Right Atrium: Right atrial size was normal in size  Pericardium: Trivial pericardial effusion is present.  Mitral Valve: The mitral valve is abnormal. There is mild thickening of the mitral valve leaflet(s). Mild mitral annular calcification. Trace mitral valve regurgitation.  Tricuspid Valve: The tricuspid valve is grossly normal. Tricuspid valve regurgitation is trivial by color flow Doppler.  Aortic Valve: The aortic valve is tricuspid. Aortic valve regurgitation was not visualized by color flow Doppler. Mild aortic valve sclerosis is present, with no evidence of aortic valve stenosis.  Pulmonic Valve: The pulmonic valve was grossly normal. Pulmonic valve  regurgitation is not visualized by color flow Doppler.  Aorta: The aortic root is normal in size and structure.  Venous: The inferior vena cava is dilated in size with greater than 50% respiratory variability, suggesting right atrial pressure of 8 mmHg.  IAS/Shunts: No atrial level shunt detected by color flow Doppler.     LEFT VENTRICLE PLAX 2D LVIDd:         3.90 cm  Diastology LVIDs:         2.70 cm  LV e' lateral:   6.31 cm/s LV PW:         1.00 cm  LV E/e' lateral: 17.0 LV IVS:        1.10 cm  LV e' medial:    5.66 cm/s LVOT diam:     2.20 cm  LV E/e' medial:  18.9  LV SV:         39 ml LV SV Index:   19.16 LVOT Area:     3.80 cm    RIGHT VENTRICLE RV Basal diam:  2.40 cm RV S prime:     14.60 cm/s TAPSE (M-mode): 1.9 cm  LEFT ATRIUM             Index LA diam:        3.90 cm 2.02 cm/m LA Vol (A2C):   58.6 ml 30.33 ml/m LA Vol (A4C):   49.6 ml 25.67 ml/m LA Biplane Vol: 57.8 ml 29.91 ml/m  AORTIC VALVE LVOT Vmax:   149.00 cm/s LVOT Vmean:  101.000 cm/s LVOT VTI:    0.327 m   AORTA Ao Root diam: 3.40 cm Ao Asc diam:  3.30 cm  MITRAL VALVE MV Area (PHT): 2.76 cm              SHUNTS MV PHT:        79.75 msec            Systemic VTI:  0.33 m MV Decel Time: 275 msec              Systemic Diam: 2.20 cm MV E velocity: 107.00 cm/s 103 cm/s MV A velocity: 113.00 cm/s 70.3 cm/s MV E/A ratio:  0.95        1.5  Antimicrobials:  Anti-infectives (From admission, onward)   Start     Dose/Rate Route Frequency Ordered Stop   05/17/19 1630  ceFEPIme (MAXIPIME) 2 g in sodium chloride 0.9 % 100 mL IVPB     2 g 200 mL/hr over 30 Minutes Intravenous Every 8 hours 05/17/19 1535     05/03/2019 2200  cefTRIAXone (ROCEPHIN) 2 g in sodium chloride 0.9 % 100 mL IVPB  Status:  Discontinued     2 g 200 mL/hr over 30 Minutes Intravenous Every 24 hours 04/30/2019 2133 05/17/19 1459   05/18/2019 2200  azithromycin (ZITHROMAX) 500 mg in sodium chloride 0.9 % 250 mL IVPB  Status:  Discontinued     500 mg 250 mL/hr over 60 Minutes Intravenous Every 24 hours 05/02/2019 2133 05/17/19 1459   04/28/2019 1945  vancomycin (VANCOCIN) 2,000 mg in sodium chloride 0.9 % 500 mL IVPB     2,000 mg 250 mL/hr over 120 Minutes Intravenous  Once 05/22/2019 1925 05/09/2019 2223   05/18/2019 1930  vancomycin (VANCOCIN) IVPB 1000 mg/200 mL premix  Status:  Discontinued     1,000 mg 200 mL/hr over 60 Minutes Intravenous  Once 05/12/2019 1922 05/15/2019 1925   05/27/2019 1930  ceFEPIme (MAXIPIME) 2 g in sodium chloride 0.9 %  100 mL IVPB     2 g 200 mL/hr over 30 Minutes Intravenous  Once  05/18/2019 1922 05/18/2019 2223     Subjective: Seen and examined at bedside and her respiratory status continues to decline and worsening and she complains of being slightly short of breath and states that Ativan and morphine has helped tremendously.  No nausea or vomiting but this morning she tried to eat and became short of breath and started coughing.  Continues to desaturate into the 80s.  Pulmonary was at bedside when I evaluated her and there recommending high-dose steroids currently and continue diuresis.  Patient states that she rested okay last night but does not feel as good this morning.  No other concerns reported at this time  Objective: Vitals:   05/19/19 1005 05/19/19 1144 05/19/19 1200 05/19/19 1424  BP:  (!) 141/68    Pulse: 86 88    Resp: (!) 25 (!) 29    Temp:   98.1 F (36.7 C)   TempSrc:      SpO2: 95% 90%  (!) 87%  Weight:      Height:        Intake/Output Summary (Last 24 hours) at 05/19/2019 1503 Last data filed at 05/19/2019 1400 Gross per 24 hour  Intake 920 ml  Output 2700 ml  Net -1780 ml   Filed Weights   05/17/19 0415  Weight: 88.1 kg   Examination: Physical Exam:  Constitutional: WN/WD obese Caucasian female in Respiratory distress and appears anxious and uncomfortable  Eyes: Lids and conjunctivae normal, sclerae anicteric  ENMT: External Ears, Nose appear normal. Grossly normal hearing. Mucous membranes are moist. Neck: Appears normal, supple, no cervical masses, normal ROM, no appreciable thyromegaly; no Appreciable JVD Respiratory: Diminished to auscultation bilaterally, no wheezing, rales, rhonchi or crackles. Increased respiratory effort with tachypenia and accessory muscle use. Wearing 15 Liters + NRB and saturating in the 80's  Cardiovascular: Tachycardic Rate but regular Rhythm; Has a  Murmur. Trace extremity edema. Abdomen: Soft, Non-tender, Distended. Bowel sounds positive x4.  GU: Deferred. Musculoskeletal: No clubbing / cyanosis of  digits/nails. No joint deformity upper and lower extremities Skin: No rashes, lesions, ulcers on a limited skin evaluation. No induration; Warm and dry.  Neurologic: CN 2-12 grossly intact with no focal deficits. Romberg sign and cerebellar reflexes not assessed.  Psychiatric: Normal judgment and insight. Alert and oriented x 3. Anxious and depressed appearing mood and appropriate affect.   Data Reviewed: I have personally reviewed following labs and imaging studies  CBC: Recent Labs  Lab 05/24/2019 1855 05/17/19 0608 05/18/19 0209 05/19/19 0215  WBC 15.4* 16.0* 19.6* 20.3*  NEUTROABS 12.4* 13.7* 16.9* 18.5*  HGB 9.0* 9.1* 8.9* 8.6*  HCT 29.3* 29.9* 29.8* 29.0*  MCV 91.3 92.3 92.5 92.4  PLT 358 376 426* 299*   Basic Metabolic Panel: Recent Labs  Lab 05/02/2019 1855 05/06/2019 2100 05/17/19 0608 05/18/19 0209 05/19/19 0215  NA 135 135 136 135 134*  K 2.9* 2.7* 3.0* 3.7 4.5  CL 101 102 102 101 100  CO2 '23 24 23 24 24  '$ GLUCOSE 125* 145* 137* 205* 256*  BUN '19 17 11 17 23  '$ CREATININE 0.67 0.63 0.69 0.60 0.72  CALCIUM 10.2 10.0 9.4 10.3 11.0*  MG  --   --  2.0 2.3 2.3  PHOS  --   --  2.4* 2.8 2.8   GFR: Estimated Creatinine Clearance: 66.3 mL/min (by C-G formula based on SCr of 0.72 mg/dL). Liver Function Tests: Recent  Labs  Lab 05/04/2019 2100 05/18/19 0209 05/19/19 0215  AST 34 32 40  ALT 58* 52* 56*  ALKPHOS 114 128* 121  BILITOT 0.5 0.5 0.4  PROT 6.6 7.1 6.7  ALBUMIN 2.8* 2.8* 2.6*   No results for input(s): LIPASE, AMYLASE in the last 168 hours. No results for input(s): AMMONIA in the last 168 hours. Coagulation Profile: Recent Labs  Lab 05/03/2019 2100  INR 1.3*   Cardiac Enzymes: No results for input(s): CKTOTAL, CKMB, CKMBINDEX, TROPONINI in the last 168 hours. BNP (last 3 results) No results for input(s): PROBNP in the last 8760 hours. HbA1C: No results for input(s): HGBA1C in the last 72 hours. CBG: Recent Labs  Lab 05/18/19 0810 05/19/19 0733   GLUCAP 173* 205*   Lipid Profile: No results for input(s): CHOL, HDL, LDLCALC, TRIG, CHOLHDL, LDLDIRECT in the last 72 hours. Thyroid Function Tests: Recent Labs    05/17/19 1155  TSH 6.067*  FREET4 1.19*   Anemia Panel: Recent Labs    05/17/19 1155 05/18/19 0209  VITAMINB12  --  229  FOLATE  --  8.8  FERRITIN 151 166  TIBC  --  216*  IRON  --  16*  RETICCTPCT  --  1.6   Sepsis Labs: Recent Labs  Lab 05/27/2019 1855 05/18/19 0209 05/19/19 0215  PROCALCITON  --  1.06 0.55  LATICACIDVEN 1.3  --   --     Recent Results (from the past 240 hour(s))  Urine culture     Status: Abnormal   Collection Time: 05/09/19 10:21 PM   Specimen: In/Out Cath Urine  Result Value Ref Range Status   Specimen Description   Final    IN/OUT CATH URINE Performed at [facility] [address] [place], [place] [address]    Special Requests   Final    NONE Performed at [facility], [place] [address], [place], [place] [address]    Culture >=100,000 COLONIES/mL ENTEROCOCCUS FAECALIS (A)  Final   Report Status 05/12/2019 FINAL  Final   Organism ID, Bacteria ENTEROCOCCUS FAECALIS (A)  Final      Susceptibility   Enterococcus faecalis - MIC*    AMPICILLIN <=2 SENSITIVE Sensitive     LEVOFLOXACIN 1 SENSITIVE Sensitive     NITROFURANTOIN 32 SENSITIVE Sensitive     VANCOMYCIN 1 SENSITIVE Sensitive     * >=100,000 COLONIES/mL ENTEROCOCCUS FAECALIS  Culture, blood (Routine x 2)     Status: None   Collection Time: 05/09/19 10:22 PM   Specimen: BLOOD  Result Value Ref Range Status   Specimen Description   Final    BLOOD RIGHT ANTECUBITAL Performed at [facility] [address] [place], [place] [address]    Special Requests   Final    BOTTLES DRAWN AEROBIC AND ANAEROBIC Blood Culture results may not be optimal due to an excessive volume of blood received in culture bottles Performed at [facility] [address] [place], [place] [address]    Culture   Final    NO GROWTH 5 DAYS Performed at [facility], [place] [address] [place], [place] [address]    Report Status 05/14/2019 FINAL  Final  Culture, blood (Routine x 2)     Status: None   Collection Time: 05/09/19 10:22 PM   Specimen: BLOOD  Result Value Ref Range Status   Specimen Description   Final    BLOOD BLOOD LEFT FOREARM Performed at [facility], [address] [place], [place] [address]    Special Requests   Final    BOTTLES DRAWN AEROBIC AND ANAEROBIC Blood Culture results may not be optimal due to an excessive volume of blood received in culture bottles Performed at [facility] [address], [place], [place] [address]    Culture   Final    NO GROWTH 5 DAYS Performed at [facility], [place] [address] [place], [place] [address]    Report Status 05/14/2019 FINAL  Final  SARS Coronavirus 2 by RT PCR (hospital order, performed in [facility] hospital lab) Nasopharyngeal Nasopharyngeal Swab     Status: None   Collection Time: 05/09/19 10:48 PM   Specimen: Nasopharyngeal Swab  Result Value Ref Range Status   SARS Coronavirus 2 NEGATIVE NEGATIVE Final    Comment: (NOTE) If result is NEGATIVE SARS-CoV-2 target nucleic acids are NOT DETECTED. The SARS-CoV-2 RNA is generally detectable in upper and lower  respiratory specimens during the acute phase of infection. The lowest  concentration of SARS-CoV-2 viral copies this assay can detect is 250  copies / mL. A negative result does not preclude SARS-CoV-2 infection  and should not be used as the sole basis for treatment or other  patient management decisions.  A negative result may occur with  improper specimen collection / handling, submission of specimen other  than nasopharyngeal swab, presence of viral mutation(s) within the  areas targeted by this assay, and inadequate number of viral copies  (<250 copies / mL). A negative result  must be combined with clinical  observations, patient history, and epidemiological information. If result is POSITIVE SARS-CoV-2 target nucleic acids are DETECTED. The SARS-CoV-2 RNA is generally detectable in upper and lower  respiratory specimens dur ing the acute phase of infection.  Positive  results are indicative of active infection with SARS-CoV-2.  Clinical  correlation with patient history and other diagnostic information is  necessary to determine patient infection status.  Positive results do  not rule out bacterial infection or co-infection with other viruses. If result is PRESUMPTIVE POSTIVE SARS-CoV-2 nucleic acids MAY BE PRESENT.   A presumptive positive result was obtained on the submitted specimen  and confirmed on repeat testing.  While 2019 novel coronavirus  (SARS-CoV-2) nucleic acids may be present in the submitted sample  additional confirmatory testing may be necessary for epidemiological  and / or clinical management purposes  to differentiate between  SARS-CoV-2 and other Sarbecovirus currently known to infect humans.  If clinically indicated additional testing with an alternate test  methodology [phone]) is advised. The SARS-CoV-2 RNA is generally  detectable in upper and lower respiratory sp ecimens during the acute  phase of infection. The expected result is Negative. Fact Sheet for Patients:  StrictlyIdeas.no Fact Sheet for Healthcare Providers: BankingDealers.co.za This test is not yet approved or cleared by the [place] FDA and has been authorized for detection and/or diagnosis of SARS-CoV-2 by FDA under an Emergency Use Authorization (EUA).  This EUA will remain in effect (meaning this test can be used) for the duration of the COVID-19 declaration under Section 564(b)(1) of the Act, 21 U.S.C. section 360bbb-3(b)(1), unless the authorization is terminated or revoked sooner. Performed at [facility], [place] [address], [place], [place] [address]   Blood culture (routine x 2)     Status: None (Preliminary result)   Collection Time: 05/02/2019  6:55 PM   Specimen: BLOOD LEFT FOREARM  Result Value Ref Range Status  Specimen Description   Final    BLOOD LEFT FOREARM Performed at [place] [address] [place], [place] [address]    Special Requests   Final    BOTTLES DRAWN AEROBIC AND ANAEROBIC Blood Culture adequate volume Performed at [facility] [address] [place], [place] [address]    Culture   Final    NO GROWTH 3 DAYS Performed at [facility], [place] [address] [place], [place] [address]    Report Status PENDING  Incomplete  Blood culture (routine x 2)     Status: None (Preliminary result)   Collection Time: 05/24/2019  6:55 PM   Specimen: BLOOD LEFT FOREARM  Result Value Ref Range Status   Specimen Description   Final    BLOOD LEFT FOREARM Performed at [facility] [address] [place], [place] [address]    Special Requests   Final    BOTTLES DRAWN AEROBIC AND ANAEROBIC Blood Culture adequate volume Performed at [facility] [address] [place], [place] [address]    Culture   Final    NO GROWTH 3 DAYS Performed at [facility], [place] [address], [place], [place] [address]    Report Status PENDING  Incomplete  SARS CORONAVIRUS 2 (TAT 6-24 HRS) Nasopharyngeal Nasopharyngeal Swab     Status: None   Collection Time: 05/09/2019 11:24 PM   Specimen: Nasopharyngeal Swab  Result Value Ref Range Status   SARS Coronavirus 2 NEGATIVE NEGATIVE Final    Comment: (NOTE) SARS-CoV-2 target nucleic acids are NOT DETECTED. The SARS-CoV-2 RNA is generally detectable in upper and lower respiratory specimens during the acute phase of infection. Negative results do not preclude SARS-CoV-2 infection, do not rule out co-infections with other pathogens, and should not  be used as the sole basis for treatment or other patient management decisions. Negative results must be combined with clinical observations, patient history, and epidemiological information. The expected result is Negative. Fact Sheet for Patients: SugarRoll.be Fact Sheet for Healthcare Providers: https://www.woods-mathews.com/ This test is not yet approved or cleared by the [place] FDA and  has been authorized for detection and/or diagnosis of SARS-CoV-2 by FDA under an Emergency Use Authorization (EUA). This EUA will remain  in effect (meaning this test can be used) for the duration of the COVID-19 declaration under Section 56 4(b)(1) of the Act, 21 U.S.C. section 360bbb-3(b)(1), unless the authorization is terminated or revoked sooner. Performed at [facility], [place] [address] [place], [place] [address]   Urine culture     Status: Abnormal   Collection Time: 05/17/19  6:00 AM   Specimen: Urine, Random  Result Value Ref Range Status   Specimen Description   Final    URINE, RANDOM Performed at [facility] [address] [place], [place] [address]    Special Requests   Final    NONE Performed at [facility], [place] [address] [place], [place] [address]    Culture (A)  Final    <10,000 COLONIES/mL INSIGNIFICANT GROWTH Performed at [facility] [address] [place], [place] [address]    Report Status 05/18/2019 FINAL  Final  MRSA PCR Screening     Status: None   Collection Time: 05/17/19  6:00 AM   Specimen: Nasopharyngeal  Result Value Ref Range Status   MRSA by PCR NEGATIVE NEGATIVE Final    Comment:        The  GeneXpert MRSA Assay (FDA approved for NASAL specimens only), is one component of a comprehensive MRSA colonization surveillance program. It is not intended to diagnose MRSA infection nor to guide or monitor treatment for MRSA infections. Performed at  [facility], [place] [address] [place], [place] [address]     Radiology Studies: Dg Chest Port 1 View  Result Date: 05/19/2019 CLINICAL DATA:  Respiratory failure EXAM: PORTABLE CHEST 1 VIEW COMPARISON:  05/18/2019 FINDINGS: Cardiac shadow is stable. Persistent patchy airspace disease is noted throughout both lungs slightly improved when compare with the prior exam. Central vascular congestion is noted. No bony abnormality is seen. IMPRESSION: Patchy multifocal infiltrates slightly improved when compared with the prior exam. Electronically Signed   By: Inez Catalina M.D.   On: 05/19/2019 07:17   Dg Chest Port 1 View  Result Date: 05/18/2019 CLINICAL DATA:  Shortness of breath. EXAM: PORTABLE CHEST 1 VIEW COMPARISON:  Radiograph yesterday. CT 05/10/2019 FINDINGS: Slight worsening of bilateral heterogeneous airspace disease since yesterday. Unchanged heart size and mediastinal contours. No pneumothorax or large pleural effusion. Unchanged osseous structures. IMPRESSION: Slight worsening of bilateral heterogeneous airspace disease since yesterday, suspicious for multifocal pneumonia. An element of superimposed pulmonary edema is also considered. Electronically Signed   By: Keith Rake M.D.   On: 05/18/2019 05:49   Scheduled Meds: . arformoterol  15 mcg Nebulization BID  . aspirin  81 mg Oral Daily  . benzonatate  200 mg Oral TID  . budesonide  6 mg Oral Daily  . budesonide (PULMICORT) nebulizer solution  0.5 mg Nebulization BID  . Chlorhexidine Gluconate Cloth  6 each Topical Daily  . docusate sodium  100 mg Oral BID  . enoxaparin (LOVENOX) injection  40 mg Subcutaneous Q24H  . feeding supplement (ENSURE ENLIVE)  237 mL Oral BID BM  . guaiFENesin  1,200 mg Oral BID  . ipratropium-albuterol  3 mL Nebulization Q6H  . levothyroxine  125 mcg Oral Q0600  . mouth rinse  15 mL Mouth Rinse BID  . methylPREDNISolone (SOLU-MEDROL) injection  125 mg Intravenous Q4H  .  multivitamin with minerals  1 tablet Oral Daily  . pravastatin  20 mg Oral q1800  . sodium chloride flush  3 mL Intravenous Q12H  . sodium chloride flush  3 mL Intravenous Q12H   Continuous Infusions: . sodium chloride    . ceFEPime (MAXIPIME) IV 2 g (05/19/19 1432)    LOS: 3 days   Kerney Elbe, DO [facility] PAGER is on [place]  If 7PM-7AM, please contact night-coverage [email or web address] Password [email or web address] 05/19/2019, 3:03 PM

## 2019-05-19 NOTE — Progress Notes (Addendum)
NAME:  JACK MINEAU, MRN:  [id number], DOB:  [birth year], LOS: 3 ADMISSION DATE:  05/02/2019, CONSULTATION DATE:  05/17/19 REFERRING MD:  Dr. Alfredia Ferguson, CHIEF COMPLAINT:  Hypoxic Respiratory Failure    Brief History    73 y/o F who presented to [facility] on 10/20 with reports of low oxygen saturations and shortness of breath.    The patient underwent a right total knee replacement on 10/6 per Dr. Alvan Dame.  Additionally, she was recently admitted from 10/13-10/17 with concern for possible PNA.  She was treated with ceftriaxone and azithromycin.  She was COVID negative during that admission.  CTA of the chest 10/14 showed patchy diffuse bilateral airspace disease.  After discharge home, she noted that her O2 levels were running 85-88% despite increasing her oxygen.  She returns to the ER 10/20 with increased SOB and low O2 levels. Work up concerning for possible PNA.  O2 needs noted to increase to 6L.    PCCM consulted for evaluation.   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 O2 increased to 8L 10/22 O2 increased to 15L salter + NRB. Did not tolerate heated high flow due to noise  Consults:  PCCM   Procedures:     Significant Diagnostic Tests:  CTA Chest 10/14 >> acute patchy airspace disease, favor atypical pneumonia, negative for PE ECHO 10/22 >> LVEF 65-70%, LV hyperdynamic function, mild LV hypertrophy, elevated LA/LVEDP, LV diastolic impaired relaxation, normal LA/RA, trivial pericardial effusion, trace MR, trivial TR, mild aortic valve sclerosis without stenosis, RA pressure ~ 59mHg  Micro Data:  COVID 10/20 >> negative  UC 10/21 >> less than 10k colonies, insignificant growth BCx2 10/20 >>  Sputum 10/20 >>   Antimicrobials:  Cefepime 10/20 x1 Vanco 10/20 x1  Rocephin 10/20 >>   Azithro 10/20 >>   Interim history/subjective:  Pt reports she felt good this am when she woke up but tried to eat breakfast and she is now fatigued.  On 15L salter + NRB.  Sats ~ 80%.  States she had relief from morphine use.   Objective   Blood pressure (!) 155/80, pulse (!) 102, temperature 98.2 F (36.8 C), temperature source Axillary, resp. rate (!) 32, height _0  (1.626 m), weight 88.1 kg, SpO2 (!) 88 %.    FiO2 (%):  [100 %] 100 %   Intake/Output Summary (Last 24 hours) at 05/19/2019 08366Last data filed at 05/19/2019 0400 Gross per 24 hour  Intake 692.79 ml  Output 2400 ml  Net -1707.21 ml   Filed Weights   05/17/19 0415  Weight: 88.1 kg    Examination: General: adult female lying in bed, ill appearing HEENT: MM pink/moist, NRB+[place] in place, cheeks flushed / pink Neuro: AAOx4, speech clear, MAE CV: s1s2 rrr, no m/r/g PULM:  Prolonged exp phase, mild abd accessory muscle use, lungs bilaterally coarse with crackles  GI: soft, bsx4 active  Extremities: warm/dry, no edema  Skin: no rashes or lesions  Resolved Hospital Problem list      Assessment & Plan:    Acute on Chronic Hypoxic Respiratory Failure  Pulmonary Fibrosis  Underlying pulmonary fibrosis of unclear etiology with new superimposed diffuse bilateral infiltrates.  Recent surgery which raises question of aspiration but radiographic review shows diffuse infiltrates.  She was discharged after  10/6 knee surgery on 1L O2.  Ddx includes - recurrent aspiration, bacterial pneumonia, HSP, pulmonary edema and progression of ILD.  Note elevated ESR 125 on admit. Doubt viral illness given multiple recent COVID screenings that are negative. Autoimmune panel from 08/2018 essentially negative (RA 15 / slightly elevated).  PCT elevated, strep antigen & legionella negative.  P: Lasix 40 mg IV x1 + KCL  Follow I/O's  CXR review shows slight improvement in infiltrates  Wean O2 for sats >85% Continue nosocomial abx coverage   Pulmicort + Brovana BID, Duoneb Q6 Follow cultures, HSP panel  Continue high dose steroids 125 mg QID x 3 days, started on 10/22 Worry is this is an acute fibrotic flare, hope she will respond to above steroids  DNR in the event of arrest, no intubation  ECHO findings as above Provide palliative measures as indicated, PRN morphine, dilaudid, tessalon for cough  Best practice:  Diet: Heart Healthy  Pain/Anxiety/Delirium protocol (if indicated): n/a  VAP protocol (if indicated): n/a  DVT prophylaxis: enoxaparin  GI prophylaxis: n/a  Glucose control: n/a Mobility: as tolerated  Code Status: DNR Family Communication: Husband updated 10/22 at bedside. Will update on arrival.  Disposition: SDU   Labs   CBC: Recent Labs  Lab 05/26/2019 1855 05/17/19 0608 05/18/19 0209 05/19/19 0215  WBC 15.4* 16.0* 19.6* 20.3*  NEUTROABS 12.4* 13.7* 16.9* 18.5*  HGB 9.0* 9.1* 8.9* 8.6*  HCT 29.3* 29.9* 29.8* 29.0*  MCV 91.3 92.3 92.5 92.4  PLT 358 376 426* 419*    Basic Metabolic Panel: Recent Labs  Lab 05/25/2019 1855 05/08/2019 2100 05/17/19 0608 05/18/19 0209 05/19/19 0215  NA 135 135 136 135 134*  K 2.9* 2.7* 3.0* 3.7 4.5  CL 101 102 102 101 100  CO2 _0 GLUCOSE 125* 145* 137* 205* 256*  BUN _1 CREATININE 0.67 0.63 0.69 0.60 0.72  CALCIUM 10.2 10.0 9.4 10.3 11.0*  MG  --   --  2.0 2.3 2.3  PHOS  --   --  2.4* 2.8 2.8   GFR: Estimated Creatinine Clearance: 66.3 mL/min (by C-G formula based on SCr of 0.72 mg/dL). Recent Labs  Lab 05/09/2019 1855 05/17/19 0608 05/18/19 0209 05/19/19 0215  PROCALCITON  --   --  1.06 0.55  WBC 15.4* 16.0* 19.6* 20.3*  LATICACIDVEN 1.3  --   --   --     Liver Function Tests: Recent Labs  Lab 05/20/2019 2100 05/18/19 0209 05/19/19 0215  AST 34 32 40  ALT 58* 52* 56*  ALKPHOS 114 128* 121  BILITOT 0.5 0.5 0.4  PROT 6.6 7.1 6.7  ALBUMIN 2.8* 2.8* 2.6*   No results for input(s): LIPASE, AMYLASE in the last 168 hours.  No results for input(s): AMMONIA in the last 168 hours.  ABG    Component Value Date/Time   HCO3 26.6 05/09/2019 2045   TCO2 25 07/07/2012 0653   O2SAT 36.2 04/30/2019 2045     Coagulation Profile: Recent Labs  Lab 05/24/2019 2100  INR 1.3*    Cardiac Enzymes: No results for input(s): CKTOTAL, CKMB, CKMBINDEX, TROPONINI in the last 168 hours.  HbA1C: No results found for: HGBA1C  CBG: Recent Labs  Lab 05/18/19 0810 05/19/19 0733  GLUCAP 173* 205*    Critical care time: n/a     Noe Gens, NP-C [facility] 05/19/2019, 8:14 AM

## 2019-05-19 NOTE — Progress Notes (Signed)
Patient resting quietly with sats 87%.  High Flow weaned during the day to 10 liters, tolerating well.  Sats suddenly dropped to 60 and 70 range, increased High flow to 15 Liters.  RT called,  Ativan 1 mg po given RR upper 30's .  O2 changed to heated high flow 100 % O2  Liter flow at 40%.  O2 SATS slowly coming up to 86%

## 2019-05-19 NOTE — Progress Notes (Signed)
SLP Cancellation Note  Patient Details Name: [name] MRN: [id number] DOB: April 02, 1946   Cancelled treatment:       Reason Eval/Treat Not Completed: Medical issues which prohibited therapy. Consult with NP regarding ST follow up. Orders discontinued at this time, given pt overall medical status. Please reconsult if needs arise.   [name], [facility], [place] Speech Language Pathologist Office: [phone] Pager: [phone]  Shonna Chock 05/19/2019, 10:09 AM

## 2019-05-20 DIAGNOSIS — J9621 Acute and chronic respiratory failure with hypoxia: Secondary | ICD-10-CM | POA: Diagnosis not present

## 2019-05-20 DIAGNOSIS — R0600 Dyspnea, unspecified: Secondary | ICD-10-CM | POA: Diagnosis not present

## 2019-05-20 DIAGNOSIS — J849 Interstitial pulmonary disease, unspecified: Secondary | ICD-10-CM | POA: Diagnosis not present

## 2019-05-20 DIAGNOSIS — G8929 Other chronic pain: Secondary | ICD-10-CM | POA: Diagnosis not present

## 2019-05-20 DIAGNOSIS — E871 Hypo-osmolality and hyponatremia: Secondary | ICD-10-CM

## 2019-05-20 DIAGNOSIS — D72829 Elevated white blood cell count, unspecified: Secondary | ICD-10-CM

## 2019-05-20 DIAGNOSIS — I10 Essential (primary) hypertension: Secondary | ICD-10-CM | POA: Diagnosis not present

## 2019-05-20 DIAGNOSIS — Z515 Encounter for palliative care: Secondary | ICD-10-CM | POA: Diagnosis not present

## 2019-05-20 DIAGNOSIS — J189 Pneumonia, unspecified organism: Secondary | ICD-10-CM | POA: Diagnosis not present

## 2019-05-20 DIAGNOSIS — J9601 Acute respiratory failure with hypoxia: Secondary | ICD-10-CM | POA: Diagnosis not present

## 2019-05-20 LAB — CBC WITH DIFFERENTIAL/PLATELET
Abs Immature Granulocytes: 0.31 10*3/uL — ABNORMAL HIGH (ref 0.00–0.07)
Basophils Absolute: 0 10*3/uL (ref 0.0–0.1)
Basophils Relative: 0 %
Eosinophils Absolute: 0 10*3/uL (ref 0.0–0.5)
Eosinophils Relative: 0 %
HCT: 27.1 % — ABNORMAL LOW (ref 36.0–46.0)
Hemoglobin: 8.2 g/dL — ABNORMAL LOW (ref 12.0–15.0)
Immature Granulocytes: 1 %
Lymphocytes Relative: 5 %
Lymphs Abs: 1.2 10*3/uL (ref 0.7–4.0)
MCH: 28.1 pg (ref 26.0–34.0)
MCHC: 30.3 g/dL (ref 30.0–36.0)
MCV: 92.8 fL (ref 80.0–100.0)
Monocytes Absolute: 1.3 10*3/uL — ABNORMAL HIGH (ref 0.1–1.0)
Monocytes Relative: 6 %
Neutro Abs: 20.7 10*3/uL — ABNORMAL HIGH (ref 1.7–7.7)
Neutrophils Relative %: 88 %
Platelets: 453 10*3/uL — ABNORMAL HIGH (ref 150–400)
RBC: 2.92 MIL/uL — ABNORMAL LOW (ref 3.87–5.11)
RDW: 21.3 % — ABNORMAL HIGH (ref 11.5–15.5)
WBC: 23.6 10*3/uL — ABNORMAL HIGH (ref 4.0–10.5)
nRBC: 0 % (ref 0.0–0.2)

## 2019-05-20 LAB — COMPREHENSIVE METABOLIC PANEL
ALT: 50 U/L — ABNORMAL HIGH (ref 0–44)
AST: 32 U/L (ref 15–41)
Albumin: 2.5 g/dL — ABNORMAL LOW (ref 3.5–5.0)
Alkaline Phosphatase: 127 U/L — ABNORMAL HIGH (ref 38–126)
Anion gap: 9 (ref 5–15)
BUN: 37 mg/dL — ABNORMAL HIGH (ref 8–23)
CO2: 24 mmol/L (ref 22–32)
Calcium: 10.9 mg/dL — ABNORMAL HIGH (ref 8.9–10.3)
Chloride: 100 mmol/L (ref 98–111)
Creatinine, Ser: 0.58 mg/dL (ref 0.44–1.00)
GFR calc Af Amer: >60 mL/min (ref >60)
GFR calc non Af Amer: >60 mL/min (ref >60)
Glucose, Bld: 250 mg/dL — ABNORMAL HIGH (ref 70–99)
Potassium: 5.2 mmol/L — ABNORMAL HIGH (ref 3.5–5.1)
Sodium: 133 mmol/L — ABNORMAL LOW (ref 135–145)
Total Bilirubin: 0.5 mg/dL (ref 0.3–1.2)
Total Protein: 6.4 g/dL — ABNORMAL LOW (ref 6.5–8.1)

## 2019-05-20 LAB — MAGNESIUM: Magnesium: 2.6 mg/dL — ABNORMAL HIGH (ref 1.7–2.4)

## 2019-05-20 LAB — PHOSPHORUS: Phosphorus: 3.1 mg/dL (ref 2.5–4.6)

## 2019-05-20 MED ORDER — MORPHINE SULFATE (CONCENTRATE) 10 MG/0.5ML PO SOLN
10.0000 mg | Freq: Four times a day (QID) | ORAL | Status: DC
  Administered 2019-05-20 – 2019-05-21 (×4): 10 mg via ORAL
  Filled 2019-05-20 (×4): qty 0.5

## 2019-05-20 MED ORDER — IPRATROPIUM-ALBUTEROL 0.5-2.5 (3) MG/3ML IN SOLN
3.0000 mL | Freq: Four times a day (QID) | RESPIRATORY_TRACT | Status: DC
  Administered 2019-05-20 – 2019-05-21 (×3): 3 mL via RESPIRATORY_TRACT
  Filled 2019-05-20 (×4): qty 3

## 2019-05-20 MED ORDER — DEXMEDETOMIDINE HCL IN NACL 200 MCG/50ML IV SOLN
0.4000 ug/kg/h | INTRAVENOUS | Status: DC
  Administered 2019-05-20 (×4): 0.4 ug/kg/h via INTRAVENOUS
  Administered 2019-05-21 (×2): 0.6 ug/kg/h via INTRAVENOUS
  Filled 2019-05-20 (×6): qty 50

## 2019-05-20 MED ORDER — MORPHINE SULFATE (CONCENTRATE) 10 MG/0.5ML PO SOLN
10.0000 mg | ORAL | Status: DC | PRN
  Administered 2019-05-20: 10 mg via ORAL
  Filled 2019-05-20: qty 0.5

## 2019-05-20 MED ORDER — HYDROMORPHONE HCL 1 MG/ML IJ SOLN
0.5000 mg | INTRAMUSCULAR | Status: DC | PRN
  Administered 2019-05-21: 0.5 mg via INTRAVENOUS
  Administered 2019-05-21 (×3): 1 mg via INTRAVENOUS
  Filled 2019-05-20 (×4): qty 1

## 2019-05-20 NOTE — Consult Note (Signed)
Consultation Note Date: 05/20/2019   Patient Name: [name]  DOB: May 04, 1946  MRN: [id number]  Age / Sex: 73 y.o., female  PCP: Robyne Peers, MD Referring Physician: Kerney Elbe, DO  Reason for Consultation: Establishing goals of care, Non pain symptom management and Pain control  HPI/Patient Profile: 73 y.o. female  with past medical history of ILD, hypothyroid, lymphocytic colitis, HTN, recent PNA admitted on 04/29/2019 with respiratory failure related to pulmonary fibrosis and sepsis from multifocal PNA.  Palliative consulted for [place].   Clinical Assessment and Goals of Care: I met today with patient, her husband, and her daughter.  She has met with critical care team and goal is clear to continue to treat potentially reversible causes while also being aggressive with symptom management so she is not in distress.  We briefly reviewed these goals, but primary focus was on her SOB and anxiety.  Report SOB intermittently worsens, but also worse with movements and bedding changes.  Discussed use of opioids fo dyspnea and recommended trying to premedicate if this is helpful.  Family understands that she I critically ill and may not recover.  Limit of care is not intubation, no CPR.  SUMMARY OF RECOMMENDATIONS   - DNR/DNI - Continue current interventions.  Plan to maximally support and treat all potentially reversible causes to see if she can improve over the next several days.  At the same time, plan to be aggressive with symptom management to ensure adequate symptom relief.  If she worsens, plan will likely be to transition to comfort care. - Reviewed medications for symptom relief today.  She feels that currently ordered medications (PO morphine and ativan) are controlling symptoms well.  I let her and her husband know that I am available to reassess medications for symptoms this weekend if  needed.  I left my card and will check in on her tomorrow.  Code Status/Advance Care Planning:  DNR   Symptom Management:   Dyspnea: sublingual morphine as needed  Anxiety: Ativan as needed  Discussed potential transition to IV route of administration.  She prefers to keep things the same as they are for now.  Palliative Prophylaxis:   Bowel Regimen and Frequent Pain Assessment  Psycho-social/Spiritual:   Desire for further Chaplaincy support:Did not address  Prognosis:   Guarded  Discharge Planning: To Be Determined      Primary Diagnoses: Present on Admission: . Pneumonia . Acute on chronic respiratory failure with hypoxia ([place]) . Hypokalemia . Prolonged QT interval . ILD (interstitial lung disease) ([place]) . Essential hypertension . Hypothyroidism . Chronic pain   I have reviewed the medical record, interviewed the patient and family, and examined the patient. The following aspects are pertinent.  Past Medical History:  Diagnosis Date  . Anxiety state, unspecified   . Benign neoplasm of colon   . Brain tumor ([place])   . Cancer ([place])   . Colon polyps    hyperplastic  . Complication of anesthesia    requests dilaudid for pain  .  Complications affecting other specified body systems, hypertension    No cardiologist   . Contact lens/glasses fitting    wears glassses or contacts  . Depressive disorder, not elsewhere classified   . Dyspnea    due to fibrosis  . Esophageal reflux   . Esophageal ring   . Family history of colon cancer mother  . Fundic gland polyps of stomach, benign   . Hemorrhoids   . Hiatal hernia   . History of kidney stones   . Hypertension   . Hypothyroidism   . Lymphocytic colitis   . Obesity   . Other and unspecified hyperlipidemia   . Pneumonia 02/28/2019  . Pulmonary fibrosis ([place])   . Seizures ([place])    x 1   Social History   Socioeconomic History  . Marital status: Married    Spouse name: Not on file  . Number of  children: 3  . Years of education: Not on file  . Highest education level: Not on file  Occupational History  . Occupation: retired    Fish farm manager: RETIRED  Social Needs  . Financial resource strain: Not on file  . Food insecurity    Worry: Not on file    Inability: Not on file  . Transportation needs    Medical: Not on file    Non-medical: Not on file  Tobacco Use  . Smoking status: Former Smoker    Packs/day: 0.50    Years: 20.00    Pack years: 10.00    Quit date: 09/02/2010    Years since quitting: 8.7  . Smokeless tobacco: Never Used  Substance and Sexual Activity  . Alcohol use: Yes    Alcohol/week: 7.0 standard drinks    Types: 7 Standard drinks or equivalent per week    Comment: intermittently 1 martini daily on most days  . Drug use: No  . Sexual activity: Not on file  Lifestyle  . Physical activity    Days per week: Not on file    Minutes per session: Not on file  . Stress: Not on file  Relationships  . Social Herbalist on phone: Not on file    Gets together: Not on file    Attends religious service: Not on file    Active member of club or organization: Not on file    Attends meetings of clubs or organizations: Not on file    Relationship status: Not on file  Other Topics Concern  . Not on file  Social History Narrative  . Not on file   Family History  Problem Relation Age of Onset  . Breast cancer Mother   . Colon cancer Mother   . Lung cancer Maternal Uncle   . Bone cancer Maternal Grandmother   . Heart attack Father   . Stomach cancer Neg Hx   . Pancreatic cancer Neg Hx   . Esophageal cancer Neg Hx    Scheduled Meds: . arformoterol  15 mcg Nebulization BID  . aspirin  81 mg Oral Daily  . benzonatate  200 mg Oral TID  . budesonide  6 mg Oral Daily  . budesonide (PULMICORT) nebulizer solution  0.5 mg Nebulization BID  . Chlorhexidine Gluconate Cloth  6 each Topical Daily  . docusate sodium  100 mg Oral BID  . enoxaparin (LOVENOX)  injection  40 mg Subcutaneous Q24H  . feeding supplement (ENSURE ENLIVE)  237 mL Oral BID BM  . guaiFENesin  1,200 mg Oral BID  . ipratropium-albuterol  3 mL Nebulization Q6H  . levothyroxine  125 mcg Oral Q0600  . mouth rinse  15 mL Mouth Rinse BID  . methylPREDNISolone (SOLU-MEDROL) injection  125 mg Intravenous Q4H  . multivitamin with minerals  1 tablet Oral Daily  . pravastatin  20 mg Oral q1800  . sodium chloride flush  3 mL Intravenous Q12H  . sodium chloride flush  3 mL Intravenous Q12H   Continuous Infusions: . sodium chloride    . ceFEPime (MAXIPIME) IV Stopped (05/20/19 5035)  . dexmedetomidine (PRECEDEX) IV infusion 0.4 mcg/kg/hr (05/20/19 0700)   PRN Meds:.sodium chloride, acetaminophen **OR** acetaminophen, alum & mag hydroxide-simeth, diphenoxylate-atropine, HYDROmorphone (DILAUDID) injection, lip balm, LORazepam, Melatonin, methocarbamol, morphine CONCENTRATE, phenol, sodium chloride flush Medications Prior to Admission:  Prior to Admission medications   Medication Sig Start Date End Date Taking? Authorizing Provider  acetaminophen (TYLENOL) 500 MG tablet Take 2 tablets (1,000 mg total) by mouth every 8 (eight) hours. 05/03/19  Yes Babish, Rodman Key, PA-C  Ascorbic Acid (VITAMIN C) 1000 MG tablet Take 1,000 mg by mouth daily.     Yes [provider]  aspirin (ASPIRIN CHILDRENS) 81 MG chewable tablet Chew 1 tablet (81 mg total) by mouth 2 (two) times daily. Take for 4 weeks, then resume regular dose. 05/04/19 06/03/19 Yes Babish, Rodman Key, PA-C  budesonide (ENTOCORT EC) 3 MG 24 hr capsule Take 3 capsules (9 mg total) by mouth daily. Take 3,  63m tabs in morning as needed Patient taking differently: Take 6 mg by mouth daily.  03/27/19  Yes PIrene Shipper MD  budesonide-formoterol ([facility] 160-4.5 MCG/ACT inhaler Inhale 2 puffs into the lungs 2 (two) times daily. 02/17/19  Yes WMartyn Ehrich NP  Calcium Carbonate-Vitamin D 600-400 MG-UNIT tablet Take 1 tablet by mouth  daily.   Yes [provider]  cetirizine (ZYRTEC) 10 MG tablet Take 10 mg by mouth daily.   Yes [provider]  [employer] HYDROCORTISONE ACETATE 0.5 % cream APPLY TO AFFECTED AREA TOPICALLY AS NEEDED FOR HEMORRHOIDS Patient taking differently: Apply 1 application topically daily as needed (for hemorrhoids).  02/10/16  Yes PIrene Shipper MD  diphenoxylate-atropine (LOMOTIL) 2.5-0.025 MG tablet Take 1 tab by mouth as needed. Patient taking differently: Take 1-2 tablets by mouth 4 (four) times daily as needed for diarrhea or loose stools.  03/27/19  Yes PIrene Shipper MD  docusate sodium (COLACE) 100 MG capsule Take 1 capsule (100 mg total) by mouth 2 (two) times daily. 05/03/19  Yes Babish, MRodman Key PA-C  famotidine (PEPCID) 20 MG tablet TAKE 1 TABLET BY MOUTH EVERYDAY AT BEDTIME 05/15/19  Yes PIrene Shipper MD  hydrochlorothiazide (HYDRODIURIL) 25 MG tablet Take 25 mg by mouth daily. 10/03/18  Yes [provider]  HYDROmorphone (DILAUDID) 2 MG tablet Take 1-2 tablets (2-4 mg total) by mouth every 4 (four) hours as needed for severe pain. 05/03/19  Yes Babish, MRodman Key PA-C  Iron, Ferrous Sulfate, 325 (65 Fe) MG TABS Take 1 tablet by mouth 2 (two) times daily. 03/27/19  Yes PIrene Shipper MD  levothyroxine (SYNTHROID) 125 MCG tablet Take 125 mcg by mouth daily. 12/08/18  Yes [provider]  Melatonin 5 MG TABS Take 7.5 mg by mouth at bedtime as needed (sleep).    Yes [provider]  methocarbamol (ROBAXIN) 500 MG tablet Take 1 tablet (500 mg total) by mouth every 6 (six) hours as needed for muscle spasms. 05/03/19  Yes BDanae Orleans PA-C  Omega-3 Fatty Acids (FISH OIL PO) Take  1,400 mg by mouth at bedtime.    Yes [provider]  pantoprazole (PROTONIX) 40 MG tablet TAKE 1 TABLET BY MOUTH EVERY DAY 05/15/19  Yes Irene Shipper, MD  Polyethyl Glycol-Propyl Glycol (LUBRICANT EYE DROPS) 0.4-0.3 % SOLN Place 1 drop into both eyes 3 (three) times daily as needed  (dry/irritated eyes.).   Yes [provider]  pravastatin (PRAVACHOL) 20 MG tablet Take 20 mg by mouth daily. 03/25/19  Yes [provider]  sertraline (ZOLOFT) 100 MG tablet Take 150 mg by mouth daily. 11/29/18  Yes [provider]  polyethylene glycol (MIRALAX / GLYCOLAX) 17 g packet Take 17 g by mouth 2 (two) times daily. Patient not taking: Reported on 05/24/2019 05/03/19   Danae Orleans, PA-C  Respiratory Therapy Supplies (FLUTTER) DEVI 1 Device by Does not apply route as directed. 09/26/18   Martyn Ehrich, NP  Spacer/Aero-Holding Josiah Lobo DEVI 1 Device by Does not apply route as directed. 10/14/18   Brand Males, MD   Allergies  Allergen Reactions  . Shellfish Allergy Anaphylaxis    Lip swelling  . Codeine Other (See Comments)    nightmares  . Hydrocod Polst-Cpm Polst Er Itching  . Hydrocodone-Acetaminophen Itching and Other (See Comments)    Has to take benadryl with to tolerate  . Sulfa Antibiotics Rash  . Sulfonamide Derivatives Rash   Review of Systems  Constitutional: Positive for activity change.  Respiratory: Positive for chest tightness, shortness of breath and wheezing.   Neurological: Positive for weakness.  Psychiatric/Behavioral: Positive for sleep disturbance.    Physical Exam General: Alert, awake, Ill appearing, in moderate resp distress.  Heart: Regular rate and rhythm. No murmur appreciated. Lungs: Decreased, coarse/creackles throuhgout Abdomen: Soft, nontender, nondistended, positive bowel sounds.  Ext: No significant edema Skin: Warm and dry Neuro: Grossly intact, nonfocal.  Vital Signs: BP (!) 142/58   Pulse 92   Temp 98.2 F (36.8 C) (Axillary)   Resp (!) 36   Ht _0  (1.626 m)   Wt 88.1 kg   SpO2 92%   BMI 33.34 kg/m  Pain Scale: 0-10   Pain Score: 0-No pain   SpO2: SpO2: 92 % O2 Device:SpO2: 92 % O2 Flow Rate: .O2 Flow Rate (L/min): 50 L/min(w/ 15 L NRB)  IO: Intake/output summary:   Intake/Output  Summary (Last 24 hours) at 05/20/2019 3086 Last data filed at 05/20/2019 0700 Gross per 24 hour  Intake 991.4 ml  Output 2400 ml  Net -1408.6 ml    LBM: Last BM Date: 05/15/19 Baseline Weight: Weight: 88.1 kg Most recent weight: Weight: 88.1 kg     Palliative Assessment/Data:   Flowsheet Rows     Most Recent Value  Intake Tab  Referral Department  Hospitalist  Unit at Time of Referral  ICU  Palliative Care Primary Diagnosis  Pulmonary  Date Notified  05/18/19  Palliative Care Type  New Palliative care  Reason for referral  Clarify Goals of Care  Date of Admission  05/11/2019  Date first seen by Palliative Care  05/19/19  # of days Palliative referral response time  1 Day(s)  # of days IP prior to Palliative referral  2  Clinical Assessment  Psychosocial & Spiritual Assessment  Palliative Care Outcomes      Time In: 1100 Time Out: 1200 Time Total: 60 Greater than 50%  of this time was spent counseling and coordinating care related to the above assessment and plan.  Signed by: Micheline Rough, MD   Please contact  Palliative Medicine Team phone at [phone] for questions and concerns.  For individual provider: See Amion             

## 2019-05-20 NOTE — Progress Notes (Signed)
Daily Progress Note   Patient Name: [name]       Date: 05/20/2019 DOB: 26-May-1946  Age: 73 y.o. MRN#: [id number] Attending Physician: Kerney Elbe, DO Primary Care Physician: Robyne Peers, MD Admit Date: 05/05/2019  Reason for Consultation/Follow-up: GOC and symptom management  Subjective: I saw and examined [name] today.  She continues to have increased WOB.   Just received morphine.  At this time, she denies SOB or other needs.  Husband and son at the bedside and her husband reports anxiety and agitation last evening that improved with precedex.  Reviewed MAR and she has been getting regular doses of morphine concentrate.  Discussed scheduling this and also noted that we can certainly transition to IV medications if it is easier for her.  As she reports currently not being SOB, will continue with oral morphine for now.  Length of Stay: 4  Current Medications: Scheduled Meds:  . arformoterol  15 mcg Nebulization BID  . aspirin  81 mg Oral Daily  . benzonatate  200 mg Oral TID  . budesonide  6 mg Oral Daily  . budesonide (PULMICORT) nebulizer solution  0.5 mg Nebulization BID  . Chlorhexidine Gluconate Cloth  6 each Topical Daily  . docusate sodium  100 mg Oral BID  . enoxaparin (LOVENOX) injection  40 mg Subcutaneous Q24H  . feeding supplement (ENSURE ENLIVE)  237 mL Oral BID BM  . guaiFENesin  1,200 mg Oral BID  . ipratropium-albuterol  3 mL Nebulization Q6H  . levothyroxine  125 mcg Oral Q0600  . mouth rinse  15 mL Mouth Rinse BID  . methylPREDNISolone (SOLU-MEDROL) injection  125 mg Intravenous Q4H  . morphine CONCENTRATE  10 mg Oral Q6H  . multivitamin with minerals  1 tablet Oral Daily  . pravastatin  20 mg Oral q1800  . sodium chloride flush  3 mL  Intravenous Q12H  . sodium chloride flush  3 mL Intravenous Q12H    Continuous Infusions: . sodium chloride    . ceFEPime (MAXIPIME) IV Stopped (05/20/19 [id number])  . dexmedetomidine (PRECEDEX) IV infusion 0.4 mcg/kg/hr (05/20/19 0700)    PRN Meds: sodium chloride, acetaminophen **OR** acetaminophen, alum & mag hydroxide-simeth, diphenoxylate-atropine, HYDROmorphone (DILAUDID) injection, lip balm, LORazepam, Melatonin, methocarbamol, morphine CONCENTRATE, phenol, sodium chloride flush  Physical Exam  General: Sleepy but easily awakens, Ill appearing, in moderate resp distress.  Heart: Regular rate and rhythm.  Lungs: Decreased, coarse Abdomen: Soft, nontender, nondistended, positive bowel sounds.  Ext: No significant edema Skin: Warm and dry Neuro: Grossly intact, nonfocal.  Vital Signs: BP (!) 142/58   Pulse 92   Temp 98.2 F (36.8 C) (Axillary)   Resp (!) 36   Ht 5\' 4"  (1.626 m)   Wt 88.1 kg   SpO2 92%   BMI 33.34 kg/m  SpO2: SpO2: 92 % O2 Device: O2 Device: High Flow Nasal Cannula O2 Flow Rate: O2 Flow Rate (L/min): 50 L/min(w/ 15 L NRB)  Intake/output summary:   Intake/Output Summary (Last 24 hours) at 05/20/2019 [id number] Last data filed at 05/20/2019 0700 Gross per 24 hour  Intake 991.4 ml  Output 2400 ml  Net -1408.6 ml   LBM: Last BM Date: 05/15/19 Baseline Weight: Weight: 88.1 kg Most recent weight: Weight: 88.1 kg       Palliative Assessment/Data:    Flowsheet Rows     Most Recent Value  Intake Tab  Referral Department  Hospitalist  Unit at Time of Referral  ICU  Palliative Care Primary Diagnosis  Pulmonary  Date Notified  05/18/19  Palliative Care Type  New Palliative care  Reason for referral  Clarify Goals of Care  Date of Admission  05/17/2019  Date first seen by Palliative Care  05/19/19  # of days Palliative referral response time  1 Day(s)  # of days IP prior to Palliative referral  2  Clinical Assessment  Palliative Performance Scale  Score  20%  Psychosocial & Spiritual Assessment  Palliative Care Outcomes  Patient/Family meeting held?  Yes  Who was at the meeting?  Husband, daughter, patient  Palliative Care Outcomes  Clarified goals of care, Improved non-pain symptom therapy      Patient Active Problem List   Diagnosis Date Noted  . Hypokalemia 05/27/2019  . Prolonged QT interval 05/04/2019  . Chronic pain 05/20/2019  . Hypoxemia   . Acute on chronic respiratory failure with hypoxia ([place]) 05/14/2019  . Lymphocytic colitis 05/11/2019  . Essential hypertension 05/11/2019  . Iron deficiency anemia due to chronic blood loss 05/11/2019  . Hypothyroidism 05/11/2019  . CAP (community acquired pneumonia) 05/10/2019  . Pneumonia 05/09/2019  . Obese 05/03/2019  . S/P right TKA 05/02/2019  . Asthma 02/17/2019  . Flu-like symptoms 09/26/2018  . ILD (interstitial lung disease) ([place]) 09/26/2018  . Tinnitus, bilateral 10/20/2016  . Excessive cerumen in both ear canals 10/20/2016  . Benign essential tremor 06/09/2016  . Benign paroxysmal positional vertigo 06/09/2016  . History of benign brain tumor 06/09/2016  . Gait disturbance 06/09/2016  . Trimalleolar fracture of ankle, closed 04/03/2015  . Lumbar radiculopathy 04/19/2013  . DIARRHEA 09/15/2010  . PERSONAL HISTORY OF FAILED MODERATE SEDATION 09/15/2010  . DIARRHEA-PRESUMED INFECTIOUS 07/24/2010  . HEMORRHOIDS-INTERNAL 12/10/2008  . HEMORRHOIDS-EXTERNAL 12/10/2008  . PERSONAL HX COLONIC POLYPS 12/10/2008  . COLONIC POLYPS, HYPERPLASTIC 11/22/2008  . HYPERLIPIDEMIA 11/22/2008  . ANXIETY 11/22/2008  . DEPRESSION 11/22/2008  . ABNORMAL HEART RHYTHMS 11/22/2008  . HEMORRHOIDS 11/22/2008  . GERD 11/22/2008  . Diaphragmatic hernia 11/22/2008  . HYPERTENSION NEC 11/22/2008    Palliative Care Assessment & Plan   Patient Profile: 73 y.o. female  with past medical history of ILD, hypothyroid, lymphocytic colitis, HTN, recent PNA admitted on 05/17/2019 with  respiratory failure related to pulmonary fibrosis and sepsis from multifocal PNA.  Palliative consulted for [place].  Assessment: Patient Active Problem List   Diagnosis Date Noted  . Hypokalemia 05/10/2019  . Prolonged QT interval 05/17/2019  . Chronic pain 05/25/2019  . Hypoxemia   . Acute on chronic respiratory failure with hypoxia ([place]) 05/14/2019  . Lymphocytic colitis 05/11/2019  . Essential hypertension 05/11/2019  . Iron deficiency anemia due to chronic blood loss 05/11/2019  . Hypothyroidism 05/11/2019  . CAP (community acquired pneumonia) 05/10/2019  . Pneumonia 05/09/2019  . Obese 05/03/2019  . S/P right TKA 05/02/2019  . Asthma 02/17/2019  . Flu-like symptoms 09/26/2018  . ILD (interstitial lung disease) ([place]) 09/26/2018  . Tinnitus, bilateral 10/20/2016  . Excessive cerumen in both ear canals 10/20/2016  . Benign essential tremor 06/09/2016  . Benign paroxysmal positional vertigo 06/09/2016  . History of benign brain tumor 06/09/2016  . Gait disturbance 06/09/2016  . Trimalleolar fracture of ankle, closed 04/03/2015  . Lumbar radiculopathy 04/19/2013  . DIARRHEA 09/15/2010  . PERSONAL HISTORY OF FAILED MODERATE SEDATION 09/15/2010  . DIARRHEA-PRESUMED INFECTIOUS 07/24/2010  . HEMORRHOIDS-INTERNAL 12/10/2008  . HEMORRHOIDS-EXTERNAL 12/10/2008  . PERSONAL HX COLONIC POLYPS 12/10/2008  . COLONIC POLYPS, HYPERPLASTIC 11/22/2008  . HYPERLIPIDEMIA 11/22/2008  . ANXIETY 11/22/2008  . DEPRESSION 11/22/2008  . ABNORMAL HEART RHYTHMS 11/22/2008  . HEMORRHOIDS 11/22/2008  . GERD 11/22/2008  . Diaphragmatic hernia 11/22/2008  . HYPERTENSION NEC 11/22/2008    Recommendations/Plan: - DNR/DNI - Continue current interventions.  Plan to maximally support and treat all potentially reversible causes to see if she can improve, but I am concerned seeing her today that she is going to continue to worsen.  Discussed again with family and they are clear with her comfort being top  priority.  Will continue to be aggressive with symptom management to ensure adequate symptom relief.  If she continues to worsen, likely transition to comfort care. -Anxiety: Agree with addition of precedex. Would continue as it allows her to be more comfortable while being able to wake to spend time with family.  Continue ativan as needed. -SOB: Has been using oral morphine regularly.  Will schedule morphine 10mg  Q6 hours with additional dose of morphine 10mg  Q3hours as needed.  I also added indication for SOB to currently ordered dilaudid.  Continue to consider transition to IV medications if swallowing becomes difficulty.  I offered this again today, but she feels currently ordered med working well, so will continue with oral morphine concentrate. -Pain: Not currently an issue, has dilaudid prn in case pain becomes an issue. - Please call if any further symptom management needs.  Code Status:    Code Status Orders  (From admission, onward)         Start     Ordered   05/18/19 1019  Do not attempt resuscitation (DNR)  Continuous    Question Answer Comment  In the event of cardiac or respiratory ARREST Do not call a "code blue"   In the event of cardiac or respiratory ARREST Do not perform Intubation, CPR, defibrillation or ACLS   In the event of cardiac or respiratory ARREST Use medication by any route, position, wound care, and other measures to relive pain and suffering. May use oxygen, suction and manual treatment of airway obstruction as needed for comfort.      05/18/19 1018        Code Status History    Date Active Date Inactive Code Status Order ID Comments User Context   05/17/2019 1324 05/18/2019 1018 Partial Code [id number]  Donita Brooks, NP Inpatient  05/17/2019 0338 05/17/2019 1323 Full Code [id number]  Vianne Bulls, MD Inpatient   05/10/2019 0200 05/13/2019 1742 Full Code [id number]  Rise Patience, MD ED   05/02/2019 1047 05/03/2019 1724 Full Code [id number]   Rudi Coco Inpatient   04/04/2015 1559 04/05/2015 1928 Full Code [id number]  Wylene Simmer, MD Inpatient   04/03/2015 1134 04/04/2015 1554 Full Code [id number]  Corky Sing, PA-C Inpatient   Advance Care Planning Activity    Advance Directive Documentation     Most Recent Value  Type of Advance Directive  Healthcare Power of Attorney, Living will  Pre-existing out of facility DNR order (yellow form or pink MOST form)  -  "MOST" Form in Place?  -       Prognosis:   Guarded  Discharge Planning:  To Be Determined, but very possible this will be terminal admission  Care plan was discussed with RN, son, daughter, husband, patient, and Dr. Alfredia Ferguson  Thank you for allowing the Palliative Medicine Team to assist in the care of this patient.   Time In: 0855 Time Out: 0940 Total Time 45 Prolonged Time Billed No      Greater than 50%  of this time was spent counseling and coordinating care related to the above assessment and plan.  Micheline Rough, MD  Please contact Palliative Medicine Team phone at [phone] for questions and concerns.

## 2019-05-20 NOTE — Progress Notes (Signed)
PROGRESS NOTE    HAYA HEMLER  QMV:[id number] DOB: 01-24-1946 DOA: 05/10/2019 PCP: Robyne Peers, MD   Brief Narrative:  HPI per Dr. Mitzi Hansen on 04/30/2019 [name] is a 73 y.o. female with medical history significant for interstitial lung disease, hypothyroidism, lymphocytic colitis, hypertension, and recent admission with pneumonia, discharged on 1 L of supplemental oxygen, now returning to the ED with chills, worsened dyspnea and cough, and increased supplemental oxygen requirement.  Patient reports that she was feeling much improved when she left the hospital on 05/13/2019 and continued to do well back at home until last night when she developed shaking chills.  She also has increase in her shortness of breath and nonproductive cough over the past 24 hours.  She denies any chest pain, leg swelling, or calf tenderness.  No one with fever or respiratory symptoms at home.  She denies abdominal pain, vomiting, or diarrhea since the recent admission.  ED Course: Upon arrival to the ED, patient is found to be febrile to 38.2 C, requiring 3 L/min of supplemental oxygen to maintain saturations in the low 90s, mildly tachypneic, and with stable blood pressure.  EKG features a sinus rhythm with a QTc interval of 550 ms.  Chest x-ray is concerning for extensive bilateral peripheral hazy infiltrates consistent with markedly progressed pneumonia.  Chemistry panel is notable for potassium of 2.9.  CBC features a leukocytosis to 15,400 and a stable normocytic anemia.  Blood cultures were collected, 3 L normal saline administered, and the patient was treated with acetaminophen, cefepime, and vancomycin.  COVID-19 testing has been ordered but not yet performed.  **Interim History Continued to be tachypneic and wearing supplemental oxygen via nasal cannula but worsened and had to be placed on a heated high flow nasal cannula with 40 L and 100% FiO2 but she requested to go back to 15 Liters +NRB and  has been desaturating even on that.  She is now is back on heated high flow nasal cannula and she ended up going to 70 L with 100% FiO2 and this is slowly been titrating   Pulmonary was consulted for further evaluation recommendations.  SARS-CoV-2 testing was negative now being diuresed and given IV 40 mg Lasix yesterday AM.  Antibiotics were escalated to IV Cefepime and pulmonary added adding Brovana and Pulmicort in addition to her DuoNebs.  Have also changed her IV dexamethasone to IV Solu-Medrol and now on 125 mg q4h x3 Days and this is day 230.  Echocardiogram was repeated and patient was made a DNR and has a very poor prognosis.  Continues to be dyspneic so morphine was added in addition to her Ativan and palliative care was consulted for further goals of care discussion and symptom management.  Overnight the patient had to be put on Precedex drip and she is started on oral morphine yesterday for her dyspnea worsening breathing and this has been helping her.  Her condition continues to worsen despite aggressive interventions will continue at this time however may eventually transition to comfort care.  Assessment & Plan:   Principal Problem:   Pneumonia Active Problems:   ILD (interstitial lung disease) ([place])   Essential hypertension   Hypothyroidism   Acute on chronic respiratory failure with hypoxia (HCC)   Hypokalemia   Prolonged QT interval   Chronic pain  Sepsis 2/2 Multifocal PNA complicated in the setting of ILD and Pulmonary Fibrosis  Acute on chronic hypoxic respiratory failure now on heated high flow nasal cannula with  70 L and 100% FiO2 -Presented with chills, increased SOB and cough, and increased supplemental O2 requirement, and is found to have marked progression in pneumonia with stable BP and reassuringly normal lactate; Continues to be Febrile, Tachycardic and Tachypenic  -Blood cultures were collected in ED and she was treated with 3 liters NS, IV Vancomycin, and  IV  Cefepime; -After IVF Hydration was given Diuresis with IV Furosemide 60 milligrams and this is now been continued and she is being diuresed by pulmonary and received 40 mg yesterday -Check COVID-19 and this was negative -Check sputum culture, strep pneumo and legionella antigens, -Checked MRSA PCR and was Negative -Continued antibiotics with Rocephin and azithromycin but pulmonary escalated to IV cefepime   -Added Combivent 1 puff q6h and C/w Albuterol Inhaler prn but this was changed to DuoNeb's and pulmonary is adding Brovana and budesonide -Added Incentive Spirometry -Start Dexamethasone 6 mg IV x10 for now and pulmonary changed this to IV Solu-Medrol 40 mg twice daily but then further increased to flare dosing of ILD and she is now on 125 mg every 4 scheduled for 3 days and also added Guaifenesin 1200 mg po BID -Recent CTA PE onh 05/10/2019 showed "Acute patchy airspace disease, favor atypical pneumonia. Given there is a background of chronic interstitial lung disease acute alveolitis is also considered. No evidence for pulmonary embolism but notably limited in the upper lobes due to bolus timing." -Check Inflammatory Markers and LDH was 311, and D-Dimer was 6.18, Ferriting was 151, CRP was 26.8 and ESR was 125 -WBC was 15.4 on admission and is worsened to 20.3 -Follow Cx Data and Temperature Curve; Check Procalcitonin Level  -Pulmonary Consulted for further evaluation and assistance in Management and they are considering noncardiogenic cardiogenic pulmonary edema and obtained a echocardiogram which has been done and showed an EF of 65 to 62% and diastolic dysfunction -They are recommending an SLP evaluation to evaluate for occult aspiration and following intermittent chest x-ray. -Pulmonary also sent for a hypersensitivity panel and her autoimmune evaluation from 08/27/2018 was essentially negative -CRP remains elevated 27.9 procalcitonin was 1.06 and is now 0.55 -Pulmonary recommending weaning  for O2 saturation greater than 88% and following strict I's and O's and daily weights; she is -975 mL -Patient continues to be tachypneic and Leukocytosis worsened in the setting of Steroid Demargination -Repeat CXR yesterday AM showed  "Patchy multifocal infiltrates slightly improved when compared with the prior exam." -Patient was continuing to desaturate today especially on 15 L of nonrebreather and had saturations in the low 80s and a difficult time recovering -Repeat CXR in AM and Continue to Monitor Respiratory Status Carefully -Continue with Oral Morphine and Ativan for increased work of breathing; care involves ongoing care and has scheduled oral morphine 10 mg every 6 -Pulmonary recommending aggressive treatment for the next several days but if she does not improve they are recommending a possible transition to a palliative approach as patient does not want intubation or CPR and Dr. Lamonte Sakai discussed this with the patient's family -Patient prognosis is poor and despite her aggressive measures still desaturate -Now is on a Precedex drip as well as hydromorphone IV for pain -Respirations have been elevated and has had respirations in the 6s -Appreciate palliative's help along with pulmonary involvement in this case  Hypertension  -BP currently was 118/44 -Continue to Hold HCTZ in light of electrolyte derangements, use labetalol IVP as needed for now    Normocytic Anemia  -Hgb is 9.0 on admission; Repeat now  showed a Hgb/Hct of 8.2/27.1 -Checked Anemia Panel and showed an iron level of 16, U IBC of 200, TIBC of 216, saturation ratios of 7%, ferritin level of 166, folate of 8.8, vitamin B12 was 229 -Appears stable and continue to Monitor for S/Sx of Bleeding; Currently no overt bleeding noted  -Repeat CBC in AM   Prolonged QT interval  -QTc is 515 ms on admission  -Replace potassium to 4 and mag to 2, minimize QT-prolonging medications, and repeat EKG in am  as last EKG showed a QTC  of 457  Lymphocytic Colitis  -Continue Budesonide 6 mg po Daily and have added Dexamethazone 6 mg IV for Respiratory Issues; Defer to Pulmonary to adjust  -C/w Lomotil 1-2 tab po 4 Times Daily   Hypothyroidism -Checked TSH and was 6.067; Checked Free T4 which was 1.19 and T3 and was 62 -C/w Levothyroxine 125 mcg po Daily   HLD -C/w Pravastatin 20 mg po Daily   Obesity -Estimated body mass index is 33.34 kg/m as calculated from the following:   Height as of this encounter: '5\' 4"'$  (1.626 m).   Weight as of this encounter: 88.1 kg. -Weight Loss and Dietary Counseling given -Nutritionist consulted for further evaluation and Recc's and Nutritionist ordering Ensure Enlive BID and Magic Cup BID and Daily MVI+ Minerals   Anxiety and Depression -C/w Lorazepam 1 mg po q6hprn Anxiety   Elevated ALT -In the setting of PNA -ALT went from 58 -> 52 -> 56 -> 50 -If not improving or worsening will obtain a RUQ U/S and Acute Hepatitis Panel -Repeat CMP in AM   Hyperglycemia -In the setting of steroid demargination Her blood sugars have been ranging from 205-256 Plan continue monitor and if she continues to improve will place on sensitive alongside scale insulin however otherwise we will hold off and avoid giving the patient to many injections for comfort  Elevated BUN -In the setting of steroid demargination -Patient's BUN is now 37 and dissociated from her creatinine as her creatinine is 0.58 -Continue  and trend repeat CMP in a.m.  Leukocytosis -Worsened in the setting of steroid demargination -Continue to monitor as patient is on significant doses of steroids with IV Solu-Medrol 125 mg every 4h -Continue to monitor and trend and repeat CBC in a.m.  Electrolyte Abnormalities:  Hypokalemia -Patient's K+ this AM was 5.2 -May try lokelma if worsening  -Mag Level was 2.6 -Continue to Monitor and Replete as Necessary  Hypophosphatemia -Patient's Phos Level this AM was 3.1 -Continue  to Monitor and Replete as Necessary -Repeat Phos Level in AM   Hyponatremia -Mild as patient's sodium has gone from 135 and is now 133 -Continue monitor and trend and repeat CMP in a.m. -Likely this is in the setting of Lasix use  Hypercalcemia -In the setting of trying to keep patient is dry as possible -Patient's calcium level is now 10.9 -We will continue to monitor and trend and repeat CMP in a.m.  DVT prophylaxis: Enoxaparin 40 mg sq q24h Code Status: DO NOT RESUSCITATE  Family Communication: Discussed with Husband and Daughter and Son at Beside Disposition Plan: Remain in SDU for further evaluation and care given her worsening respiratory status continued desaturations as her prognosis is poor and she is at high risk for decompensation further  Consultants:   Pulmonary/PCCM  Pallative Care  Procedures:  ECHOCARDIOGRAM IMPRESSIONS    1. Left ventricular ejection fraction, by visual estimation, is 65 to 70%. The left ventricle has hyperdynamic function. There is  mildly increased left ventricular hypertrophy.  2. Elevated left atrial and left ventricular end-diastolic pressures.  3. Left ventricular diastolic Doppler parameters are consistent with impaired relaxation pattern of LV diastolic filling.  4. Global right ventricle has normal systolic function.The right ventricular size is normal. No increase in right ventricular wall thickness.  5. Left atrial size was normal.  6. Right atrial size was normal.  7. Trivial pericardial effusion is present.  8. Mild mitral annular calcification.  9. The mitral valve is abnormal. Trace mitral valve regurgitation. 10. The tricuspid valve is grossly normal. Tricuspid valve regurgitation is trivial. 11. The aortic valve is tricuspid Aortic valve regurgitation was not visualized by color flow Doppler. Mild aortic valve sclerosis without stenosis. 12. The pulmonic valve was grossly normal. Pulmonic valve regurgitation is not visualized  by color flow Doppler. 13. The inferior vena cava is dilated in size with >50% respiratory variability, suggesting right atrial pressure of 8 mmHg.  FINDINGS  Left Ventricle: Left ventricular ejection fraction, by visual estimation, is 65 to 70%. The left ventricle has hyperdynamic function. There is mildly increased left ventricular hypertrophy. Spectral Doppler shows Left ventricular diastolic Doppler  parameters are consistent with impaired relaxation pattern of LV diastolic filling. Elevated left atrial and left ventricular end-diastolic pressures.  Right Ventricle: The right ventricular size is normal. No increase in right ventricular wall thickness. Global RV systolic function is has normal systolic function.  Left Atrium: Left atrial size was normal in size.  Right Atrium: Right atrial size was normal in size  Pericardium: Trivial pericardial effusion is present.  Mitral Valve: The mitral valve is abnormal. There is mild thickening of the mitral valve leaflet(s). Mild mitral annular calcification. Trace mitral valve regurgitation.  Tricuspid Valve: The tricuspid valve is grossly normal. Tricuspid valve regurgitation is trivial by color flow Doppler.  Aortic Valve: The aortic valve is tricuspid. Aortic valve regurgitation was not visualized by color flow Doppler. Mild aortic valve sclerosis is present, with no evidence of aortic valve stenosis.  Pulmonic Valve: The pulmonic valve was grossly normal. Pulmonic valve regurgitation is not visualized by color flow Doppler.  Aorta: The aortic root is normal in size and structure.  Venous: The inferior vena cava is dilated in size with greater than 50% respiratory variability, suggesting right atrial pressure of 8 mmHg.  IAS/Shunts: No atrial level shunt detected by color flow Doppler.     LEFT VENTRICLE PLAX 2D LVIDd:         3.90 cm  Diastology LVIDs:         2.70 cm  LV e' lateral:   6.31 cm/s LV PW:         1.00 cm   LV E/e' lateral: 17.0 LV IVS:        1.10 cm  LV e' medial:    5.66 cm/s LVOT diam:     2.20 cm  LV E/e' medial:  18.9 LV SV:         39 ml LV SV Index:   19.16 LVOT Area:     3.80 cm    RIGHT VENTRICLE RV Basal diam:  2.40 cm RV S prime:     14.60 cm/s TAPSE (M-mode): 1.9 cm  LEFT ATRIUM             Index LA diam:        3.90 cm 2.02 cm/m LA Vol (A2C):   58.6 ml 30.33 ml/m LA Vol (A4C):   49.6 ml 25.67 ml/m LA Biplane  Vol: 57.8 ml 29.91 ml/m  AORTIC VALVE LVOT Vmax:   149.00 cm/s LVOT Vmean:  101.000 cm/s LVOT VTI:    0.327 m   AORTA Ao Root diam: 3.40 cm Ao Asc diam:  3.30 cm  MITRAL VALVE MV Area (PHT): 2.76 cm              SHUNTS MV PHT:        79.75 msec            Systemic VTI:  0.33 m MV Decel Time: 275 msec              Systemic Diam: 2.20 cm MV E velocity: 107.00 cm/s 103 cm/s MV A velocity: 113.00 cm/s 70.3 cm/s MV E/A ratio:  0.95        1.5  Antimicrobials:  Anti-infectives (From admission, onward)   Start     Dose/Rate Route Frequency Ordered Stop   05/17/19 1630  ceFEPIme (MAXIPIME) 2 g in sodium chloride 0.9 % 100 mL IVPB     2 g 200 mL/hr over 30 Minutes Intravenous Every 8 hours 05/17/19 1535     05/09/2019 2200  cefTRIAXone (ROCEPHIN) 2 g in sodium chloride 0.9 % 100 mL IVPB  Status:  Discontinued     2 g 200 mL/hr over 30 Minutes Intravenous Every 24 hours 05/12/2019 2133 05/17/19 1459   05/15/2019 2200  azithromycin (ZITHROMAX) 500 mg in sodium chloride 0.9 % 250 mL IVPB  Status:  Discontinued     500 mg 250 mL/hr over 60 Minutes Intravenous Every 24 hours 05/11/2019 2133 05/17/19 1459   04/28/2019 1945  vancomycin (VANCOCIN) 2,000 mg in sodium chloride 0.9 % 500 mL IVPB     2,000 mg 250 mL/hr over 120 Minutes Intravenous  Once 04/27/2019 1925 05/10/2019 2223   05/09/2019 1930  vancomycin (VANCOCIN) IVPB 1000 mg/200 mL premix  Status:  Discontinued     1,000 mg 200 mL/hr over 60 Minutes Intravenous  Once 05/13/2019 1922 04/30/2019 1925   05/08/2019 1930   ceFEPIme (MAXIPIME) 2 g in sodium chloride 0.9 % 100 mL IVPB     2 g 200 mL/hr over 30 Minutes Intravenous  Once 05/26/2019 1922 05/15/2019 2223     Subjective: Seen and examined at bedside and and she is a little sleepy this morning and still continue to desaturate with elevated respirations.  Was wearing his high flow nasal cannula along with a nonrebreather at 70 L and was weaning down.  No nausea or vomiting and this feels fatigued.  States the oral morphine has helped significantly.  No other concerns or complaints at this time and family at bedside and are appreciated of of the care provided.  Objective: Vitals:   05/20/19 1000 05/20/19 1045 05/20/19 1155 05/20/19 1200  BP:    (!) 118/44  Pulse: 65 63 64 61  Resp: (!) 52 (!) 40 (!) 40 (!) 52  Temp:    97.9 F (36.6 C)  TempSrc:    Axillary  SpO2: 91% 93% (!) 88% (!) 86%  Weight:      Height:        Intake/Output Summary (Last 24 hours) at 05/20/2019 1526 Last data filed at 05/20/2019 1000 Gross per 24 hour  Intake 234.21 ml  Output 1200 ml  Net -965.79 ml   Filed Weights   05/17/19 0415  Weight: 88.1 kg   Examination: Physical Exam:  Constitutional: WN/WD obese Caucasian female in Respiratory Distress and appears uncomfortable  Eyes: Lids and conjunctivae  normal, sclerae anicteric  ENMT: External Ears, Nose appear normal. Grossly normal hearing. Mucous membranes are moist.  Neck: Appears normal, supple, no cervical masses, normal ROM, no appreciable thyromegaly; Difficult to Assess JVD Respiratory: Diminished to auscultation bilaterally with coarse breath sounds, no wheezing, rales, rhonchi or crackles. Increased Respirations and is tachypenic with some accessory muscle use and is on HFNC with 50 L/min and 100% FIO2  Cardiovascular: Tachycardic rate but regular rhythm, Has a murmur. Very slight extremity edema. Abdomen: Soft, non-tender,Distended.  Bowel sounds positive x4.  GU: Deferred. Musculoskeletal: No clubbing /  cyanosis of digits/nails. No joint deformity upper and lower extremities.  Skin: No rashes, lesions, ulcers on a limited skin evaluation. No induration; Warm and dry.  Neurologic: CN 2-12 grossly intact with no focal deficits.  Romberg sign cerebellar reflexes not assessed.  Psychiatric: Normal judgment and insight. Somnolent but and oriented x 3. Anxious appearing mood and appropriate affect.   Data Reviewed: I have personally reviewed following labs and imaging studies  CBC: Recent Labs  Lab 05/18/2019 1855 05/17/19 0608 05/18/19 0209 05/19/19 0215 05/20/19 0159  WBC 15.4* 16.0* 19.6* 20.3* 23.6*  NEUTROABS 12.4* 13.7* 16.9* 18.5* 20.7*  HGB 9.0* 9.1* 8.9* 8.6* 8.2*  HCT 29.3* 29.9* 29.8* 29.0* 27.1*  MCV 91.3 92.3 92.5 92.4 92.8  PLT 358 376 426* 419* 973*   Basic Metabolic Panel: Recent Labs  Lab 05/27/2019 2100 05/17/19 0608 05/18/19 0209 05/19/19 0215 05/20/19 0159  NA 135 136 135 134* 133*  K 2.7* 3.0* 3.7 4.5 5.2*  CL 102 102 101 100 100  CO2 _0 GLUCOSE 145* 137* 205* 256* 250*  BUN _1 37*  CREATININE 0.63 0.69 0.60 0.72 0.58  CALCIUM 10.0 9.4 10.3 11.0* 10.9*  MG  --  2.0 2.3 2.3 2.6*  PHOS  --  2.4* 2.8 2.8 3.1   GFR: Estimated Creatinine Clearance: 66.3 mL/min (by C-G formula based on SCr of 0.58 mg/dL). Liver Function Tests: Recent Labs  Lab 05/09/2019 2100 05/18/19 0209 05/19/19 0215 05/20/19 0159  AST 34 32 40 32  ALT 58* 52* 56* 50*  ALKPHOS 114 128* 121 127*  BILITOT 0.5 0.5 0.4 0.5  PROT 6.6 7.1 6.7 6.4*  ALBUMIN 2.8* 2.8* 2.6* 2.5*   No results for input(s): LIPASE, AMYLASE in the last 168 hours. No results for input(s): AMMONIA in the last 168 hours. Coagulation Profile: Recent Labs  Lab 05/27/2019 2100  INR 1.3*   Cardiac Enzymes: No results for input(s): CKTOTAL, CKMB, CKMBINDEX, TROPONINI in the last 168 hours. BNP (last 3 results) No results for input(s): PROBNP in the last 8760 hours. HbA1C: No results for  input(s): HGBA1C in the last 72 hours. CBG: Recent Labs  Lab 05/18/19 0810 05/19/19 0733  GLUCAP 173* 205*   Lipid Profile: No results for input(s): CHOL, HDL, LDLCALC, TRIG, CHOLHDL, LDLDIRECT in the last 72 hours. Thyroid Function Tests: No results for input(s): TSH, T4TOTAL, FREET4, T3FREE, THYROIDAB in the last 72 hours. Anemia Panel: Recent Labs    05/18/19 0209  VITAMINB12 229  FOLATE 8.8  FERRITIN 166  TIBC 216*  IRON 16*  RETICCTPCT 1.6   Sepsis Labs: Recent Labs  Lab 05/26/2019 1855 05/18/19 0209 05/19/19 0215  PROCALCITON  --  1.06 0.55  LATICACIDVEN 1.3  --   --     Recent Results (from the past 240 hour(s))  Blood culture (routine x 2)     Status: None (Preliminary result)  Collection Time: 05/06/2019  6:55 PM   Specimen: BLOOD LEFT FOREARM  Result Value Ref Range Status   Specimen Description   Final    BLOOD LEFT FOREARM Performed at [facility] [address] [place], [place] [address]    Special Requests   Final    BOTTLES DRAWN AEROBIC AND ANAEROBIC Blood Culture adequate volume Performed at [facility] [address] [place], [place] [address]    Culture   Final    NO GROWTH 4 DAYS Performed at [facility], [place] [address], [place], [place] [address]    Report Status PENDING  Incomplete  Blood culture (routine x 2)     Status: None (Preliminary result)   Collection Time: 05/25/2019  6:55 PM   Specimen: BLOOD LEFT FOREARM  Result Value Ref Range Status   Specimen Description   Final    BLOOD LEFT FOREARM Performed at [facility] [address], [place], [place] [address]    Special Requests   Final    BOTTLES DRAWN AEROBIC AND ANAEROBIC Blood Culture adequate volume Performed at [facility] [address], [place], [place] [address]    Culture   Final    NO GROWTH 4 DAYS Performed at [facility], [place] [address], [place], [place] [address]     Report Status PENDING  Incomplete  SARS CORONAVIRUS 2 (TAT 6-24 HRS) Nasopharyngeal Nasopharyngeal Swab     Status: None   Collection Time: 05/08/2019 11:24 PM   Specimen: Nasopharyngeal Swab  Result Value Ref Range Status   SARS Coronavirus 2 NEGATIVE NEGATIVE Final    Comment: (NOTE) SARS-CoV-2 target nucleic acids are NOT DETECTED. The SARS-CoV-2 RNA is generally detectable in upper and lower respiratory specimens during the acute phase of infection. Negative results do not preclude SARS-CoV-2 infection, do not rule out co-infections with other pathogens, and should not be used as the sole basis for treatment or other patient management decisions. Negative results must be combined with clinical observations, patient history, and epidemiological information. The expected result is Negative. Fact Sheet for Patients: SugarRoll.be Fact Sheet for Healthcare Providers: https://www.woods-mathews.com/ This test is not yet approved or cleared by the [place] FDA and  has been authorized for detection and/or diagnosis of SARS-CoV-2 by FDA under an Emergency Use Authorization (EUA). This EUA will remain  in effect (meaning this test can be used) for the duration of the COVID-19 declaration under Section 56 4(b)(1) of the Act, 21 U.S.C. section 360bbb-3(b)(1), unless the authorization is terminated or revoked sooner. Performed at [facility], [place] [address], [place], [place] [address]   Urine culture     Status: Abnormal   Collection Time: 05/17/19  6:00 AM   Specimen: Urine, Random  Result Value Ref Range Status   Specimen Description   Final    URINE, RANDOM Performed at [facility] [address], [place], [place] [address]    Special Requests   Final    NONE Performed at [facility], [place] [address], [place],  [address]    Culture (A)  Final    <10,000 COLONIES/mL INSIGNIFICANT  GROWTH Performed at [facility] [address] [place],  [address]    Report Status 05/18/2019 FINAL  Final  MRSA PCR Screening     Status: None   Collection Time: 05/17/19  6:00 AM   Specimen: Nasopharyngeal  Result Value Ref Range  Status   MRSA by PCR NEGATIVE NEGATIVE Final    Comment:        The GeneXpert MRSA Assay (FDA approved for NASAL specimens only), is one component of a comprehensive MRSA colonization surveillance program. It is not intended to diagnose MRSA infection nor to guide or monitor treatment for MRSA infections. Performed at [facility], [place] [address], [place], [place] [address]     Radiology Studies: Dg Chest Port 1 View  Result Date: 05/19/2019 CLINICAL DATA:  Respiratory failure EXAM: PORTABLE CHEST 1 VIEW COMPARISON:  05/18/2019 FINDINGS: Cardiac shadow is stable. Persistent patchy airspace disease is noted throughout both lungs slightly improved when compare with the prior exam. Central vascular congestion is noted. No bony abnormality is seen. IMPRESSION: Patchy multifocal infiltrates slightly improved when compared with the prior exam. Electronically Signed   By: Inez Catalina M.D.   On: 05/19/2019 07:17   Scheduled Meds: . arformoterol  15 mcg Nebulization BID  . aspirin  81 mg Oral Daily  . benzonatate  200 mg Oral TID  . budesonide  6 mg Oral Daily  . budesonide (PULMICORT) nebulizer solution  0.5 mg Nebulization BID  . Chlorhexidine Gluconate Cloth  6 each Topical Daily  . docusate sodium  100 mg Oral BID  . enoxaparin (LOVENOX) injection  40 mg Subcutaneous Q24H  . feeding supplement (ENSURE ENLIVE)  237 mL Oral BID BM  . guaiFENesin  1,200 mg Oral BID  . ipratropium-albuterol  3 mL Nebulization Q6H WA  . levothyroxine  125 mcg Oral Q0600  . mouth rinse  15 mL Mouth Rinse BID  . methylPREDNISolone (SOLU-MEDROL) injection  125 mg Intravenous Q4H  . morphine CONCENTRATE  10 mg Oral Q6H  . multivitamin with  minerals  1 tablet Oral Daily  . pravastatin  20 mg Oral q1800  . sodium chloride flush  3 mL Intravenous Q12H  . sodium chloride flush  3 mL Intravenous Q12H   Continuous Infusions: . sodium chloride    . ceFEPime (MAXIPIME) IV Stopped (05/20/19 1342)  . dexmedetomidine (PRECEDEX) IV infusion 0.4 mcg/kg/hr (05/20/19 1511)    LOS: 4 days   Kerney Elbe, DO [facility] PAGER is on [place]  If 7PM-7AM, please contact night-coverage [email or web address] Password [email or web address] 05/20/2019, 3:26 PM

## 2019-05-20 NOTE — Progress Notes (Signed)
[place] Progress Note Patient Name: [name] DOB: 08-01-1945 MRN: [id number]   Date of Service  05/20/2019  HPI/Events of Note  Patient would have episodes of agitation with subsequent desaturation. Already on PO ativan and morphine RTC. Request for morphine drip.  eICU Interventions  Trial of Precedex drip. Continued discussion regarding goals of care     Intervention Category Major Interventions: Delirium, psychosis, severe agitation - evaluation and management  [name] 05/20/2019, 4:50 AM

## 2019-05-20 NOTE — Progress Notes (Signed)
NAME:  [name], MRN:  [id number], DOB:  05/23/1946, LOS: 4 ADMISSION DATE:  05/13/2019, CONSULTATION DATE:  05/17/19 REFERRING MD:  Dr. Alfredia Ferguson, CHIEF COMPLAINT:  Hypoxic Respiratory Failure    Brief History    73 y/o F who presented to [facility] on 10/20 with reports of low oxygen saturations and shortness of breath.    The patient underwent a right total knee replacement on 10/6 per Dr. Alvan Dame.  Additionally, she was recently admitted from 10/13-10/17 with concern for possible PNA.  She was treated with ceftriaxone and azithromycin.  She was COVID negative during that admission.  CTA of the chest 10/14 showed patchy diffuse bilateral airspace disease.  After discharge home, she noted that her O2 levels were running 85-88% despite increasing her oxygen.  She returns to the ER 10/20 with increased SOB and low O2 levels. Work up concerning for possible PNA.  O2 needs noted to increase to 6L.    PCCM consulted for evaluation.   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 O2 increased to 8L 10/22 O2 increased to 15L salter + NRB. Did not tolerate heated high flow due to noise  Consults:  PCCM  Palliative care 10/23   Procedures:     Significant Diagnostic Tests:  CTA Chest 10/14 >> acute patchy airspace disease, favor atypical pneumonia, negative for PE ECHO 10/22 >> LVEF 65-70%, LV hyperdynamic function, mild LV hypertrophy, elevated LA/LVEDP, LV diastolic impaired relaxation, normal LA/RA, trivial pericardial effusion, trace MR, trivial TR, mild aortic valve sclerosis without stenosis, RA pressure ~ 65mHg  Micro Data:  COVID 10/20 >> negative  UC 10/21 >> less than 10k colonies, insignificant growth BCx2 10/20 >> NG Sputum 10/20 >> not obtainable    Antimicrobials:  Cefepime 10/20 x1  Vanco 10/20 x1  Rocephin 10/20 >> x one  Azithro 10/20 >> x one  Maxeime 10/21 >>>  Interim history/subjective:   doing better since precedex added/ palliative care on board   Objective   Blood pressure (!) 124/51, pulse 65, temperature 98.2 F (36.8 C), temperature source Axillary, resp. rate (!) 52, height '5\' 4"'$  (1.626 m), weight 88.1 kg, SpO2 91 %.    FiO2 (%):  [100 %] 100 %   Intake/Output Summary (Last 24 hours) at 05/20/2019 1103 Last data filed at 05/20/2019 1000 Gross per 24 hour  Intake 777.81 ml  Output 2400 ml  Net -1622.19 ml   Filed Weights   05/17/19 0415  Weight: 88.1 kg    Examination:  Pt sleeping on my arrival not awakened No jvd Lungs with coarse insp> exp rhonchi bilaterally RRR no s3 or or sign murmur Abd soft, nl  excursion  Extr warm with no edema or clubbing noted Neuro  sedated  Resolved Hospital Problem list      Assessment & Plan:    Acute on Chronic Hypoxic Respiratory Failure  Pulmonary Fibrosis  Underlying pulmonary fibrosis of unclear etiology with new superimposed diffuse bilateral infiltrates assoc with marked increase esr .  P: Continue nosocomial abx coverage  Pulmicort + Brovana BID, Duoneb Q6 Follow cultures, HSP panel  Continue high dose steroids 125 mg QID   Most c/w  an acute fibrotic flare, hope she will respond to above steroids  DNR in the event of arrest,  no intubation       Best practice:  Diet: Heart Healthy as tol Pain/Anxiety/Delirium protocol (if indicated): n/a  VAP protocol (if indicated): n/a  DVT prophylaxis: enoxaparin  GI prophylaxis: n/a  Glucose control: n/a Mobility: as tolerated  Code Status: DNR Family Communication: Husband updated 10/24 at bedside, comfortable with palliation   Disposition: SDU   Labs   CBC: Recent Labs  Lab 05/08/2019 1855 05/17/19 0608 05/18/19 0209 05/19/19 0215 05/20/19 0159  WBC 15.4* 16.0* 19.6* 20.3* 23.6*  NEUTROABS 12.4* 13.7* 16.9* 18.5* 20.7*  HGB 9.0*  9.1* 8.9* 8.6* 8.2*  HCT 29.3* 29.9* 29.8* 29.0* 27.1*  MCV 91.3 92.3 92.5 92.4 92.8  PLT 358 376 426* 419* 453*    Basic Metabolic Panel: Recent Labs  Lab 05/18/2019 2100 05/17/19 0608 05/18/19 0209 05/19/19 0215 05/20/19 0159  NA 135 136 135 134* 133*  K 2.7* 3.0* 3.7 4.5 5.2*  CL 102 102 101 100 100  CO2 '24 23 24 24 24  '$ GLUCOSE 145* 137* 205* 256* 250*  BUN '17 11 17 23 '$ 37*  CREATININE 0.63 0.69 0.60 0.72 0.58  CALCIUM 10.0 9.4 10.3 11.0* 10.9*  MG  --  2.0 2.3 2.3 2.6*  PHOS  --  2.4* 2.8 2.8 3.1   GFR: Estimated Creatinine Clearance: 66.3 mL/min (by C-G formula based on SCr of 0.58 mg/dL). Recent Labs  Lab 04/28/2019 1855 05/17/19 0608 05/18/19 0209 05/19/19 0215 05/20/19 0159  PROCALCITON  --   --  1.06 0.55  --   WBC 15.4* 16.0* 19.6* 20.3* 23.6*  LATICACIDVEN 1.3  --   --   --   --     Liver Function Tests: Recent Labs  Lab 05/07/2019 2100 05/18/19 0209 05/19/19 0215 05/20/19 0159  AST 34 32 40 32  ALT 58* 52* 56* 50*  ALKPHOS 114 128* 121 127*  BILITOT 0.5 0.5 0.4 0.5  PROT 6.6 7.1 6.7 6.4*  ALBUMIN 2.8* 2.8* 2.6* 2.5*   No results for input(s): LIPASE, AMYLASE in the last 168 hours. No results for input(s): AMMONIA in the last 168 hours.  ABG    Component Value Date/Time   HCO3 26.6 04/30/2019 2045   TCO2 25 07/07/2012 0653   O2SAT 36.2 04/30/2019 2045     Coagulation Profile: Recent Labs  Lab 04/27/2019 2100  INR 1.3*    Cardiac Enzymes: No results for input(s): CKTOTAL, CKMB, CKMBINDEX, TROPONINI in the last 168 hours.  HbA1C: No results found for: HGBA1C  CBG: Recent Labs  Lab 05/18/19 0810 05/19/19 0733  GLUCAP 173* 205*    Nothing else to offer at this point.    Christinia Gully, MD [facility] [place] [phone] After 5:30 PM or weekends, use [facility] [phone]

## 2019-05-21 ENCOUNTER — Inpatient Hospital Stay (HOSPITAL_COMMUNITY): Payer: Medicare Other

## 2019-05-21 DIAGNOSIS — J9601 Acute respiratory failure with hypoxia: Secondary | ICD-10-CM | POA: Diagnosis not present

## 2019-05-21 DIAGNOSIS — G8929 Other chronic pain: Secondary | ICD-10-CM | POA: Diagnosis not present

## 2019-05-21 DIAGNOSIS — I10 Essential (primary) hypertension: Secondary | ICD-10-CM | POA: Diagnosis not present

## 2019-05-21 DIAGNOSIS — Z515 Encounter for palliative care: Secondary | ICD-10-CM | POA: Diagnosis not present

## 2019-05-21 DIAGNOSIS — J189 Pneumonia, unspecified organism: Secondary | ICD-10-CM

## 2019-05-21 DIAGNOSIS — R0600 Dyspnea, unspecified: Secondary | ICD-10-CM | POA: Diagnosis not present

## 2019-05-21 DIAGNOSIS — J9621 Acute and chronic respiratory failure with hypoxia: Secondary | ICD-10-CM | POA: Diagnosis not present

## 2019-05-21 LAB — CULTURE, BLOOD (ROUTINE X 2)
Culture: NO GROWTH
Culture: NO GROWTH
Special Requests: ADEQUATE
Special Requests: ADEQUATE

## 2019-05-21 LAB — CBC WITH DIFFERENTIAL/PLATELET
Abs Immature Granulocytes: 1.15 10*3/uL — ABNORMAL HIGH (ref 0.00–0.07)
Basophils Absolute: 0.1 10*3/uL (ref 0.0–0.1)
Basophils Relative: 0 %
Eosinophils Absolute: 0 10*3/uL (ref 0.0–0.5)
Eosinophils Relative: 0 %
HCT: 23.7 % — ABNORMAL LOW (ref 36.0–46.0)
Hemoglobin: 7.1 g/dL — ABNORMAL LOW (ref 12.0–15.0)
Immature Granulocytes: 5 %
Lymphocytes Relative: 6 %
Lymphs Abs: 1.4 10*3/uL (ref 0.7–4.0)
MCH: 28.2 pg (ref 26.0–34.0)
MCHC: 30 g/dL (ref 30.0–36.0)
MCV: 94 fL (ref 80.0–100.0)
Monocytes Absolute: 1.5 10*3/uL — ABNORMAL HIGH (ref 0.1–1.0)
Monocytes Relative: 6 %
Neutro Abs: 19.5 10*3/uL — ABNORMAL HIGH (ref 1.7–7.7)
Neutrophils Relative %: 83 %
Platelets: 318 10*3/uL (ref 150–400)
RBC: 2.52 MIL/uL — ABNORMAL LOW (ref 3.87–5.11)
RDW: 20.9 % — ABNORMAL HIGH (ref 11.5–15.5)
WBC: 23.6 10*3/uL — ABNORMAL HIGH (ref 4.0–10.5)
nRBC: 0.2 % (ref 0.0–0.2)

## 2019-05-21 LAB — PHOSPHORUS: Phosphorus: 4.7 mg/dL — ABNORMAL HIGH (ref 2.5–4.6)

## 2019-05-21 LAB — COMPREHENSIVE METABOLIC PANEL
ALT: 51 U/L — ABNORMAL HIGH (ref 0–44)
AST: 27 U/L (ref 15–41)
Albumin: 2.4 g/dL — ABNORMAL LOW (ref 3.5–5.0)
Alkaline Phosphatase: 148 U/L — ABNORMAL HIGH (ref 38–126)
Anion gap: 10 (ref 5–15)
BUN: 74 mg/dL — ABNORMAL HIGH (ref 8–23)
CO2: 22 mmol/L (ref 22–32)
Calcium: 10.7 mg/dL — ABNORMAL HIGH (ref 8.9–10.3)
Chloride: 103 mmol/L (ref 98–111)
Creatinine, Ser: 0.86 mg/dL (ref 0.44–1.00)
GFR calc Af Amer: >60 mL/min (ref >60)
GFR calc non Af Amer: >60 mL/min (ref >60)
Glucose, Bld: 286 mg/dL — ABNORMAL HIGH (ref 70–99)
Potassium: 4.7 mmol/L (ref 3.5–5.1)
Sodium: 135 mmol/L (ref 135–145)
Total Bilirubin: 0.6 mg/dL (ref 0.3–1.2)
Total Protein: 5.8 g/dL — ABNORMAL LOW (ref 6.5–8.1)

## 2019-05-21 LAB — MAGNESIUM: Magnesium: 2.9 mg/dL — ABNORMAL HIGH (ref 1.7–2.4)

## 2019-05-21 MED ORDER — LORAZEPAM 2 MG/ML IJ SOLN
2.0000 mg | INTRAMUSCULAR | Status: DC
  Administered 2019-05-21: 2 mg via INTRAVENOUS
  Filled 2019-05-21: qty 1

## 2019-05-21 MED ORDER — ONDANSETRON HCL 4 MG/2ML IJ SOLN: 4.0000 mg | Freq: Four times a day (QID) | INTRAMUSCULAR | Status: DC | PRN

## 2019-05-21 MED ORDER — POLYVINYL ALCOHOL 1.4 % OP SOLN
1.0000 [drp] | Freq: Four times a day (QID) | OPHTHALMIC | Status: DC | PRN
  Filled 2019-05-21: qty 15

## 2019-05-21 MED ORDER — METHYLPREDNISOLONE SODIUM SUCC 125 MG IJ SOLR
125.0000 mg | Freq: Three times a day (TID) | INTRAMUSCULAR | Status: DC
  Administered 2019-05-21: 125 mg via INTRAVENOUS
  Filled 2019-05-21: qty 2

## 2019-05-21 MED ORDER — IPRATROPIUM-ALBUTEROL 0.5-2.5 (3) MG/3ML IN SOLN
3.0000 mL | RESPIRATORY_TRACT | Status: DC | PRN
  Administered 2019-05-21: 3 mL via RESPIRATORY_TRACT
  Filled 2019-05-21: qty 3

## 2019-05-21 MED ORDER — LORAZEPAM 2 MG/ML IJ SOLN
2.0000 mg | INTRAMUSCULAR | Status: DC | PRN
  Administered 2019-05-21: 2 mg via INTRAVENOUS
  Filled 2019-05-21: qty 1

## 2019-05-21 MED ORDER — HALOPERIDOL LACTATE 5 MG/ML IJ SOLN: 0.5000 mg | INTRAMUSCULAR | Status: DC | PRN

## 2019-05-21 MED ORDER — HYDROMORPHONE HCL 2 MG/ML IJ SOLN
2.0000 mg | INTRAMUSCULAR | Status: AC
  Administered 2019-05-21: 2 mg via INTRAVENOUS
  Filled 2019-05-21: qty 1

## 2019-05-21 MED ORDER — ONDANSETRON 4 MG PO TBDP: 4.0000 mg | ORAL_TABLET | Freq: Four times a day (QID) | ORAL | Status: DC | PRN

## 2019-05-21 MED ORDER — LORAZEPAM 2 MG/ML IJ SOLN
1.0000 mg | INTRAMUSCULAR | Status: DC
  Administered 2019-05-21 (×2): 1 mg via INTRAVENOUS
  Filled 2019-05-21 (×2): qty 1

## 2019-05-21 MED ORDER — HYDROMORPHONE HCL 1 MG/ML IJ SOLN: 1.0000 mg | INTRAMUSCULAR | Status: DC | PRN

## 2019-05-21 MED ORDER — HALOPERIDOL 1 MG PO TABS: 0.5000 mg | ORAL_TABLET | ORAL | Status: DC | PRN

## 2019-05-21 MED ORDER — MORPHINE 100MG IN NS 100ML (1MG/ML) PREMIX INFUSION
10.0000 mg/h | INTRAVENOUS | Status: DC
  Administered 2019-05-21: 5 mg/h via INTRAVENOUS
  Administered 2019-05-21: 13 mg/h via INTRAVENOUS
  Filled 2019-05-21 (×4): qty 100

## 2019-05-21 MED ORDER — GLYCOPYRROLATE 0.2 MG/ML IJ SOLN
0.2000 mg | INTRAMUSCULAR | Status: DC | PRN
  Filled 2019-05-21 (×2): qty 1

## 2019-05-21 MED ORDER — LORAZEPAM 2 MG/ML IJ SOLN
1.0000 mg | INTRAMUSCULAR | Status: DC | PRN
  Administered 2019-05-21: 1 mg via INTRAVENOUS
  Filled 2019-05-21: qty 1

## 2019-05-21 MED ORDER — LORAZEPAM 2 MG/ML IJ SOLN
2.0000 mg/h | INTRAVENOUS | Status: DC
  Administered 2019-05-21: 2 mg/h via INTRAVENOUS
  Filled 2019-05-21: qty 25

## 2019-05-21 MED ORDER — HALOPERIDOL LACTATE 2 MG/ML PO CONC
0.5000 mg | ORAL | Status: DC | PRN
  Filled 2019-05-21: qty 0.3

## 2019-05-21 MED ORDER — GLYCOPYRROLATE 0.2 MG/ML IJ SOLN: 0.2000 mg | INTRAMUSCULAR | Status: DC | PRN

## 2019-05-21 MED ORDER — LORAZEPAM BOLUS VIA INFUSION
1.0000 mg | INTRAVENOUS | Status: DC | PRN
  Filled 2019-05-21: qty 1

## 2019-05-21 MED ORDER — MORPHINE BOLUS VIA INFUSION
4.0000 mg | INTRAVENOUS | Status: DC | PRN
  Administered 2019-05-21 (×4): 4 mg via INTRAVENOUS
  Filled 2019-05-21: qty 4

## 2019-05-21 MED ORDER — MORPHINE BOLUS VIA INFUSION
8.0000 mg | INTRAVENOUS | Status: DC | PRN
  Administered 2019-05-21: 8 mg via INTRAVENOUS
  Filled 2019-05-21: qty 8

## 2019-05-21 MED ORDER — GLYCOPYRROLATE 1 MG PO TABS: 1.0000 mg | ORAL_TABLET | ORAL | Status: DC | PRN

## 2019-05-21 MED ORDER — BIOTENE DRY MOUTH MT LIQD: 15.0000 mL | OROMUCOSAL | Status: DC | PRN

## 2019-05-22 ENCOUNTER — Other Ambulatory Visit (HOSPITAL_COMMUNITY): Payer: Medicare Other

## 2019-05-22 ENCOUNTER — Ambulatory Visit: Payer: Medicare Other | Admitting: Internal Medicine

## 2019-05-22 LAB — HYPERSENSITIVITY PNEUMONITIS
A. Pullulans Abs: NEGATIVE
A.Fumigatus #1 Abs: NEGATIVE
Micropolyspora faeni, IgG: NEGATIVE
Pigeon Serum Abs: NEGATIVE
Thermoact. Saccharii: NEGATIVE
Thermoactinomyces vulgaris, IgG: NEGATIVE

## 2019-05-28 NOTE — Progress Notes (Signed)
Pt unresponsive and apneic.  Unable to palpate carotid pulse.  DNR status confirmed.  Asystole on monitor and confirmed in two leads.  No breath sounds auscultated for full minute.  No heart sounds auscultated for full minute.  Pupils fixed and dilated.  Confirmed by Angelica Rothermel,RN. Patient pronounced dead at: 10/31/36.  Jacqulyn Ducking ICU/SD RN4 / Care Coordinator / Rapid Response Nurse Rapid Response Number:  [phone] ICU Charge Nurse Number:  [phone]

## 2019-05-28 NOTE — Progress Notes (Signed)
PROGRESS NOTE    [name]  PJK:[id number] DOB: 13-Sep-1945 DOA: 05/11/2019 PCP: Robyne Peers, MD   Brief Narrative:  HPI per Dr. Mitzi Hansen on 05/26/2019 [name] is a 73 y.o. female with medical history significant for interstitial lung disease, hypothyroidism, lymphocytic colitis, hypertension, and recent admission with pneumonia, discharged on 1 L of supplemental oxygen, now returning to the ED with chills, worsened dyspnea and cough, and increased supplemental oxygen requirement.  Patient reports that she was feeling much improved when she left the hospital on 05/13/2019 and continued to do well back at home until last night when she developed shaking chills.  She also has increase in her shortness of breath and nonproductive cough over the past 24 hours.  She denies any chest pain, leg swelling, or calf tenderness.  No one with fever or respiratory symptoms at home.  She denies abdominal pain, vomiting, or diarrhea since the recent admission.  ED Course: Upon arrival to the ED, patient is found to be febrile to 38.2 C, requiring 3 L/min of supplemental oxygen to maintain saturations in the low 90s, mildly tachypneic, and with stable blood pressure.  EKG features a sinus rhythm with a QTc interval of 550 ms.  Chest x-ray is concerning for extensive bilateral peripheral hazy infiltrates consistent with markedly progressed pneumonia.  Chemistry panel is notable for potassium of 2.9.  CBC features a leukocytosis to 15,400 and a stable normocytic anemia.  Blood cultures were collected, 3 L normal saline administered, and the patient was treated with acetaminophen, cefepime, and vancomycin.  COVID-19 testing has been ordered but not yet performed.  **Interim History Continued to be tachypneic and wearing supplemental oxygen via nasal cannula but worsened and had to be placed on a heated high flow nasal cannula with 40 L and 100% FiO2 but she requested to go back to 15 Liters +NRB and  has been desaturating even on that.  She is now is back on heated high flow nasal cannula and she ended up going to 70 L with 100% FiO2 and this is slowly been titrating but O2 saturation continues to worsen   Pulmonary was consulted for further evaluation recommendations.  SARS-CoV-2 testing was negative now being diuresed and given IV 40 mg Lasix yesterday AM.  Antibiotics were escalated to IV Cefepime and pulmonary added adding Brovana and Pulmicort in addition to her DuoNebs.  Have also changed her IV dexamethasone to IV Solu-Medrol and now on 125 mg q4h x3 Days and this is day 230.  Echocardiogram was repeated and patient was made a DNR and has a very poor prognosis.  Continues to be dyspneic so morphine was added in addition to her Ativan and palliative care was consulted for further goals of care discussion and symptom management.  The night before last the patient had to be put on Precedex drip and she is started on oral morphine yesterday for her dyspnea worsening breathing and this has been helping her.  Her condition continues to worsen despite aggressive interventions and after discussion with family and Palliative a decision was made to primarily focus on the patient's comfort and dignity and transition to full comfort care and measures.   Assessment & Plan:   Principal Problem:   Pneumonia Active Problems:   ILD (interstitial lung disease) ([facility])   Essential hypertension   Hypothyroidism   Acute on chronic respiratory failure with hypoxia (HCC)   Hypokalemia   Prolonged QT interval   Chronic pain  Sepsis 2/2  Multifocal PNA complicated in the setting of ILD and Pulmonary Fibrosis  Acute on chronic hypoxic respiratory failure now on heated high flow nasal cannula with 70 L and 100% FiO2 -Presented with chills, increased SOB and cough, and increased supplemental O2 requirement, and is found to have marked progression in pneumonia with stable BP and reassuringly normal lactate;  Continues to be Febrile, Tachycardic and Tachypenic  -Blood cultures were collected in ED and she was treated with 3 liters NS, IV Vancomycin, and  IV Cefepime; -After IVF Hydration was given Diuresis with IV Furosemide 60 milligrams and this is now been continued and she is being diuresed by pulmonary and received 40 mg yesterday -Check COVID-19 and this was negative -Check sputum culture, strep pneumo and legionella antigens, -Checked MRSA PCR and was Negative -Continued antibiotics with Rocephin and azithromycin but pulmonary escalated to IV cefepime but will now stop given transition to Comfort Measures  -Added Combivent 1 puff q6h and C/w Albuterol Inhaler prn but this was changed to DuoNeb's and pulmonary is adding Brovana and budesonide and will continue as needed for Comfort  -Added Incentive Spirometry -Started Dexamethasone 6 mg IV x10 for now and pulmonary changed this to IV Solu-Medrol 40 mg twice daily but then further increased to flare dosing of ILD and she is now on 125 mg every 4 scheduled for 3 days and stopped today -Also added Guaifenesin 1200 mg po BID but not awake enough to take po -Recent CTA PE onh 05/10/2019 showed "Acute patchy airspace disease, favor atypical pneumonia. Given there is a background of chronic interstitial lung disease acute alveolitis is also considered. No evidence for pulmonary embolism but notably limited in the upper lobes due to bolus timing." -Check Inflammatory Markers and LDH was 311, and D-Dimer was 6.18, Ferriting was 151, CRP was 26.8 and ESR was 125 -WBC was 15.4 on admission and is worsened to 23.6 -Will not continue to Monitor Cx Data and Temperature Curve -Pulmonary Consulted for further evaluation and assistance in Management and they are considering noncardiogenic cardiogenic pulmonary edema and obtained a echocardiogram which has been done and showed an EF of 65 to 73% and diastolic dysfunction -They are recommending an SLP evaluation to  evaluate for occult aspiration and following intermittent chest x-ray. -Pulmonary also sent for a hypersensitivity panel and her autoimmune evaluation from 08/27/2018 was essentially negative -CRP remains elevated 27.9 procalcitonin was 1.06 and is now 0.55 and will not check again  -Started on Precedex gtt and will continue as well -Patient continuest to desaturate today and prognosis is exteremly poor and she will be transitioned to Full Comfort Measures -Continued with Oral Morphine and Ativan for increased work of breathing; care involves ongoing care and has scheduled oral morphine 10 mg every 6 but now will be transitoned to a Morphine Infusion with Additional PRN boluses -Appreciate palliative's help along with pulmonary involvement in this case -Patient is now Full COMFORT CARE   Hypertension  -BP currently was 109/43 -Continue to Hold HCTZ in light of electrolyte derangements -Stopped labetalol IVP as needed for now    Normocytic Anemia  -Hgb is 9.0 on admission; Repeat now showed a Hgb/Hct of 7.1/23,7 -Checked Anemia Panel and showed an iron level of 16, U IBC of 200, TIBC of 216, saturation ratios of 7%, ferritin level of 166, folate of 8.8, vitamin B12 was 229 -Appears stable and continue to Monitor for S/Sx of Bleeding; Currently no overt bleeding noted  -Will not Repeat CBC in AM now  that she is Comfort Care  Prolonged QT interval  -QTc is 515 ms on admission  -Replace potassium to 4 and mag to 2, minimize QT-prolonging medications, and repeat EKG in am  as last EKG showed a QTC of 457  Lymphocytic Colitis  -iscontinued Budesonide 6 mg po Daily and have added Dexamethazone 6 mg IV for Respiratory Issues; Defer to Pulmonary to adjust  -C/w Lomotil 1-2 tab po 4 Times Daily   Hypothyroidism -Checked TSH and was 6.067; Checked Free T4 which was 1.19 and T3 and was 62 -Discontinue Levothyroxine 125 mcg po Daily   HLD -Discontinue Pravastatin 20 mg po Daily   Obesity  -Estimated body mass index is 33.34 kg/m as calculated from the following:   Height as of this encounter: '5\' 4"'$  (1.626 m).   Weight as of this encounter: 88.1 kg. -Weight Loss and Dietary Counseling was given but she is now FULL COMFORT -Nutritionist consulted for further evaluation and Recc's and Nutritionist ordering Ensure Enlive BID and Magic Cup BID and Daily MVI+ Minerals   Anxiety and Depression -C/w Lorazepam 1 mg po q4h scheduled and q2hprn Anxiety    Elevated ALT -In the setting of PNA -ALT went from 58 -> 52 -> 56 -> 50  -Will not repeat CMP in AM as she is COMFORT CARE  Hyperglycemia -In the setting of steroid demargination Her blood sugars have been ranging from 205-256 Plan continue monitor and if she continues to improve will place on sensitive alongside scale insulin however otherwise we will hold off and avoid giving the patient to many injections for comfort  Elevated BUN -In the setting of steroid demargination -Patient's BUN is now 74 and dissociated from her creatinine as her creatinine is 0.86 -Continue  and trend repeat CMP in a.m.  Leukocytosis -Worsened in the setting of steroid demargination -Continued to Significant doses of steroids with IV Solu-Medrol 125 mg every 4h but now stopped  -Will not repeat CBC in AM as she is COMFORT CARE  Electrolyte Abnormalities:  Hypokalemia -Patient's K+ this AM was 5.2 -May try lokelma if worsening  -Mag Level was 2.9 -Will not repeat CMP in AM as she is COMFORT CARE  Hypophosphatemia -> Hyperphosphatemia -Patient's Phos Level this AM was 4.7 -Continue to Monitor and Replete as Necessary -Will not repeat Phos in AM as she is COMFORT CARE  Hyponatremia -Mild as patient's sodium has gone from 135 and is now 133 -> 135 -Continue monitor and trend and repeat CMP in a.m. -Likely this is in the setting of Lasix use  Hypercalcemia -In the setting of trying to keep patient is dry as possible -Patient's calcium  level is now 10.7 -Will not repeat CMP in AM as she is COMFORT CARE  After a lengthy discussion with the patient's family at bedside a decision was made to focus on the patient's comfort and dignity and she is transition to full comfort care and all medications are not in patient's comfort were discontinued and palliative care adjusted her medications and continue her on a Precedex drip as well as a morphine drip.  Prognosis is grim and in-hospital death is anticipated shortly as she has a very high risk for decompensation  DVT prophylaxis: Enoxaparin 40 mg sq q24h Code Status: DO NOT RESUSCITATE and Transition to COMFORT CARE Family Communication: Discussed with Husband and Daughter and Son at Beside along with Palliative Care  Disposition Plan: Remain in SDU for further evaluation and care given her worsening respiratory status continued  desaturations as her prognosis is poor and she is at high risk for decompensation further  Consultants:   Pulmonary/PCCM  Pallative Care  Procedures:  ECHOCARDIOGRAM IMPRESSIONS    1. Left ventricular ejection fraction, by visual estimation, is 65 to 70%. The left ventricle has hyperdynamic function. There is mildly increased left ventricular hypertrophy.  2. Elevated left atrial and left ventricular end-diastolic pressures.  3. Left ventricular diastolic Doppler parameters are consistent with impaired relaxation pattern of LV diastolic filling.  4. Global right ventricle has normal systolic function.The right ventricular size is normal. No increase in right ventricular wall thickness.  5. Left atrial size was normal.  6. Right atrial size was normal.  7. Trivial pericardial effusion is present.  8. Mild mitral annular calcification.  9. The mitral valve is abnormal. Trace mitral valve regurgitation. 10. The tricuspid valve is grossly normal. Tricuspid valve regurgitation is trivial. 11. The aortic valve is tricuspid Aortic valve regurgitation was  not visualized by color flow Doppler. Mild aortic valve sclerosis without stenosis. 12. The pulmonic valve was grossly normal. Pulmonic valve regurgitation is not visualized by color flow Doppler. 13. The inferior vena cava is dilated in size with >50% respiratory variability, suggesting right atrial pressure of 8 mmHg.  FINDINGS  Left Ventricle: Left ventricular ejection fraction, by visual estimation, is 65 to 70%. The left ventricle has hyperdynamic function. There is mildly increased left ventricular hypertrophy. Spectral Doppler shows Left ventricular diastolic Doppler  parameters are consistent with impaired relaxation pattern of LV diastolic filling. Elevated left atrial and left ventricular end-diastolic pressures.  Right Ventricle: The right ventricular size is normal. No increase in right ventricular wall thickness. Global RV systolic function is has normal systolic function.  Left Atrium: Left atrial size was normal in size.  Right Atrium: Right atrial size was normal in size  Pericardium: Trivial pericardial effusion is present.  Mitral Valve: The mitral valve is abnormal. There is mild thickening of the mitral valve leaflet(s). Mild mitral annular calcification. Trace mitral valve regurgitation.  Tricuspid Valve: The tricuspid valve is grossly normal. Tricuspid valve regurgitation is trivial by color flow Doppler.  Aortic Valve: The aortic valve is tricuspid. Aortic valve regurgitation was not visualized by color flow Doppler. Mild aortic valve sclerosis is present, with no evidence of aortic valve stenosis.  Pulmonic Valve: The pulmonic valve was grossly normal. Pulmonic valve regurgitation is not visualized by color flow Doppler.  Aorta: The aortic root is normal in size and structure.  Venous: The inferior vena cava is dilated in size with greater than 50% respiratory variability, suggesting right atrial pressure of 8 mmHg.  IAS/Shunts: No atrial level shunt  detected by color flow Doppler.     LEFT VENTRICLE PLAX 2D LVIDd:         3.90 cm  Diastology LVIDs:         2.70 cm  LV e' lateral:   6.31 cm/s LV PW:         1.00 cm  LV E/e' lateral: 17.0 LV IVS:        1.10 cm  LV e' medial:    5.66 cm/s LVOT diam:     2.20 cm  LV E/e' medial:  18.9 LV SV:         39 ml LV SV Index:   19.16 LVOT Area:     3.80 cm    RIGHT VENTRICLE RV Basal diam:  2.40 cm RV S prime:     14.60 cm/s TAPSE (M-mode):  1.9 cm  LEFT ATRIUM             Index LA diam:        3.90 cm 2.02 cm/m LA Vol (A2C):   58.6 ml 30.33 ml/m LA Vol (A4C):   49.6 ml 25.67 ml/m LA Biplane Vol: 57.8 ml 29.91 ml/m  AORTIC VALVE LVOT Vmax:   149.00 cm/s LVOT Vmean:  101.000 cm/s LVOT VTI:    0.327 m   AORTA Ao Root diam: 3.40 cm Ao Asc diam:  3.30 cm  MITRAL VALVE MV Area (PHT): 2.76 cm              SHUNTS MV PHT:        79.75 msec            Systemic VTI:  0.33 m MV Decel Time: 275 msec              Systemic Diam: 2.20 cm MV E velocity: 107.00 cm/s 103 cm/s MV A velocity: 113.00 cm/s 70.3 cm/s MV E/A ratio:  0.95        1.5  Antimicrobials:  Anti-infectives (From admission, onward)   Start     Dose/Rate Route Frequency Ordered Stop   05/17/19 1630  ceFEPIme (MAXIPIME) 2 g in sodium chloride 0.9 % 100 mL IVPB  Status:  Discontinued     2 g 200 mL/hr over 30 Minutes Intravenous Every 8 hours 05/17/19 1535 05-26-19 0951   04/30/2019 2200  cefTRIAXone (ROCEPHIN) 2 g in sodium chloride 0.9 % 100 mL IVPB  Status:  Discontinued     2 g 200 mL/hr over 30 Minutes Intravenous Every 24 hours 05/19/2019 2133 05/17/19 1459   05/20/2019 2200  azithromycin (ZITHROMAX) 500 mg in sodium chloride 0.9 % 250 mL IVPB  Status:  Discontinued     500 mg 250 mL/hr over 60 Minutes Intravenous Every 24 hours 05/15/2019 2133 05/17/19 1459   04/28/2019 1945  vancomycin (VANCOCIN) 2,000 mg in sodium chloride 0.9 % 500 mL IVPB     2,000 mg 250 mL/hr over 120 Minutes Intravenous  Once 05/27/2019  1925 04/27/2019 2223   05/17/2019 1930  vancomycin (VANCOCIN) IVPB 1000 mg/200 mL premix  Status:  Discontinued     1,000 mg 200 mL/hr over 60 Minutes Intravenous  Once 05/02/2019 1922 05/05/2019 1925   04/28/2019 1930  ceFEPIme (MAXIPIME) 2 g in sodium chloride 0.9 % 100 mL IVPB     2 g 200 mL/hr over 30 Minutes Intravenous  Once 05/04/2019 1922 05/22/2019 2223     Subjective: Seen and examined at bedside he was essentially unresponsive.  Had a rough night per family and was agitated.  Now resting comfortably but is tachypneic and still continues to be hypoxic.  Patient does not respond to verbal commands or physical stimuli now.  No other concerns or complaints at this time and family appreciative of care.  Objective: Vitals:   05/26/2019 0918 05/26/2019 1100 05/26/19 1200 05/26/2019 1204  BP:      Pulse:  (!) 56 64 86  Resp:  (!) 21 (!) 21 (!) 29  Temp:      TempSrc:      SpO2: (!) 89% (!) 85% 92% (!) 66%  Weight:      Height:        Intake/Output Summary (Last 24 hours) at 05/26/2019 1450 Last data filed at May 26, 2019 1054 Gross per 24 hour  Intake 836.46 ml  Output 2030 ml  Net -1193.54 ml   Danley Danker  Weights   05/17/19 0415  Weight: 88.1 kg   Examination: Physical Exam:  Constitutional: WN/WD obese Caucasian female who is unresponsive and tachypenic Eyes: Lids normal. Did not open eyes ENMT: External Ears, Nose appear normal. Grossly normal hearing. Mucous membranes are moist.  Neck: Appears normal, supple, no cervical masses, normal ROM, no appreciable thyromegaly; Difficult to assess for JVD Respiratory: Diminished to auscultation bilaterally with coarse breath sound and some rhonchi but  no appreciable wheezing, rales, r crackles. Increased respiratory effort and patient is tachypenic and hypoxic.  Cardiovascular: Tachycardic rate, no murmurs / rubs / gallops. S1 and S2 auscultated.Trace extremity edema. Abdomen: Soft, non-tender, Distended. Bowel sounds positive.  GU: Deferred.  Musculoskeletal: No clubbing / cyanosis of digits/nails. No joint deformity upper and lower extremities.  Skin: No rashes, lesions, ulcers on a limited skin evaluation No induration; Warm and dry.  Neurologic: Unresponsive and does not follow commands. Romberg sign cerebellar reflexes not assessed.  Psychiatric: Impaired judgment and insight. Not Alert and oriented x 3.   Data Reviewed: I have personally reviewed following labs and imaging studies  CBC: Recent Labs  Lab 05/17/19 0608 05/18/19 0209 05/19/19 0215 05/20/19 0159 05-31-2019 0217  WBC 16.0* 19.6* 20.3* 23.6* 23.6*  NEUTROABS 13.7* 16.9* 18.5* 20.7* 19.5*  HGB 9.1* 8.9* 8.6* 8.2* 7.1*  HCT 29.9* 29.8* 29.0* 27.1* 23.7*  MCV 92.3 92.5 92.4 92.8 94.0  PLT 376 426* 419* 453* 035   Basic Metabolic Panel: Recent Labs  Lab 05/17/19 0608 05/18/19 0209 05/19/19 0215 05/20/19 0159 May 31, 2019 0217  NA 136 135 134* 133* 135  K 3.0* 3.7 4.5 5.2* 4.7  CL 102 101 100 100 103  CO2 '23 24 24 24 22  '$ GLUCOSE 137* 205* 256* 250* 286*  BUN '11 17 23 '$ 37* 74*  CREATININE 0.69 0.60 0.72 0.58 0.86  CALCIUM 9.4 10.3 11.0* 10.9* 10.7*  MG 2.0 2.3 2.3 2.6* 2.9*  PHOS 2.4* 2.8 2.8 3.1 4.7*   GFR: Estimated Creatinine Clearance: 61.7 mL/min (by C-G formula based on SCr of 0.86 mg/dL). Liver Function Tests: Recent Labs  Lab 05/12/2019 2100 05/18/19 0209 05/19/19 0215 05/20/19 0159 05-31-2019 0217  AST 34 32 40 32 27  ALT 58* 52* 56* 50* 51*  ALKPHOS 114 128* 121 127* 148*  BILITOT 0.5 0.5 0.4 0.5 0.6  PROT 6.6 7.1 6.7 6.4* 5.8*  ALBUMIN 2.8* 2.8* 2.6* 2.5* 2.4*   No results for input(s): LIPASE, AMYLASE in the last 168 hours. No results for input(s): AMMONIA in the last 168 hours. Coagulation Profile: Recent Labs  Lab 05/08/2019 2100  INR 1.3*   Cardiac Enzymes: No results for input(s): CKTOTAL, CKMB, CKMBINDEX, TROPONINI in the last 168 hours. BNP (last 3 results) No results for input(s): PROBNP in the last 8760 hours. HbA1C:  No results for input(s): HGBA1C in the last 72 hours. CBG: Recent Labs  Lab 05/18/19 0810 05/19/19 0733  GLUCAP 173* 205*   Lipid Profile: No results for input(s): CHOL, HDL, LDLCALC, TRIG, CHOLHDL, LDLDIRECT in the last 72 hours. Thyroid Function Tests: No results for input(s): TSH, T4TOTAL, FREET4, T3FREE, THYROIDAB in the last 72 hours. Anemia Panel: No results for input(s): VITAMINB12, FOLATE, FERRITIN, TIBC, IRON, RETICCTPCT in the last 72 hours. Sepsis Labs: Recent Labs  Lab 05/25/2019 1855 05/18/19 0209 05/19/19 0215  PROCALCITON  --  1.06 0.55  LATICACIDVEN 1.3  --   --     Recent Results (from the past 240 hour(s))  Blood culture (routine x 2)  Status: None (Preliminary result)   Collection Time: 05/14/2019  6:55 PM   Specimen: BLOOD LEFT FOREARM  Result Value Ref Range Status   Specimen Description   Final    BLOOD LEFT FOREARM Performed at [facility] [address], [place], [place] [address]    Special Requests   Final    BOTTLES DRAWN AEROBIC AND ANAEROBIC Blood Culture adequate volume Performed at [facility] [address], [place], [place] [address]    Culture   Final    NO GROWTH 4 DAYS Performed at [facility], [place] [address] [place], [place] [address]    Report Status PENDING  Incomplete  Blood culture (routine x 2)     Status: None (Preliminary result)   Collection Time: 05/10/2019  6:55 PM   Specimen: BLOOD LEFT FOREARM  Result Value Ref Range Status   Specimen Description   Final    BLOOD LEFT FOREARM Performed at [facility] [address] [place], [place] [address]    Special Requests   Final    BOTTLES DRAWN AEROBIC AND ANAEROBIC Blood Culture adequate volume Performed at [facility] [address] [place], [place] [address]    Culture   Final    NO GROWTH 4 DAYS Performed at [facility], [place] [address], [place], [place] [address]     Report Status PENDING  Incomplete  SARS CORONAVIRUS 2 (TAT 6-24 HRS) Nasopharyngeal Nasopharyngeal Swab     Status: None   Collection Time: 05/20/2019 11:24 PM   Specimen: Nasopharyngeal Swab  Result Value Ref Range Status   SARS Coronavirus 2 NEGATIVE NEGATIVE Final    Comment: (NOTE) SARS-CoV-2 target nucleic acids are NOT DETECTED. The SARS-CoV-2 RNA is generally detectable in upper and lower respiratory specimens during the acute phase of infection. Negative results do not preclude SARS-CoV-2 infection, do not rule out co-infections with other pathogens, and should not be used as the sole basis for treatment or other patient management decisions. Negative results must be combined with clinical observations, patient history, and epidemiological information. The expected result is Negative. Fact Sheet for Patients: SugarRoll.be Fact Sheet for Healthcare Providers: https://www.woods-mathews.com/ This test is not yet approved or cleared by the [place] FDA and  has been authorized for detection and/or diagnosis of SARS-CoV-2 by FDA under an Emergency Use Authorization (EUA). This EUA will remain  in effect (meaning this test can be used) for the duration of the COVID-19 declaration under Section 56 4(b)(1) of the Act, 21 U.S.C. section 360bbb-3(b)(1), unless the authorization is terminated or revoked sooner. Performed at [facility], [place] [address] [place], [place] [address]   Urine culture     Status: Abnormal   Collection Time: 05/17/19  6:00 AM   Specimen: Urine, Random  Result Value Ref Range Status   Specimen Description   Final    URINE, RANDOM Performed at [facility] [address], [place], [place] [address]    Special Requests   Final    NONE Performed at [facility], [place] [address], [place], [place] [address]    Culture (A)  Final    <10,000 COLONIES/mL INSIGNIFICANT  GROWTH Performed at [facility] [address], [place],  [address]    Report Status 05/18/2019 FINAL  Final  MRSA PCR Screening     Status: None   Collection Time: 05/17/19  6:00 AM   Specimen:  Nasopharyngeal  Result Value Ref Range Status   MRSA by PCR NEGATIVE NEGATIVE Final    Comment:        The GeneXpert MRSA Assay (FDA approved for NASAL specimens only), is one component of a comprehensive MRSA colonization surveillance program. It is not intended to diagnose MRSA infection nor to guide or monitor treatment for MRSA infections. Performed at [facility], [place] [address] [place], [place] [address]     Radiology Studies: Dg Chest Port 1 View  Result Date: Jun 05, 2019 CLINICAL DATA:  Shortness of breath EXAM: PORTABLE CHEST 1 VIEW COMPARISON:  May 19, 2019 FINDINGS: There is extensive airspace opacity throughout the lungs bilaterally with areas of consolidation somewhat greater on the left than on the right. There is new patchy airspace opacity in the right base compared to 2 days prior. There is also slightly more opacification in the left upper lobe. There is a small left pleural effusion. Heart is upper normal in size with pulmonary vascularity normal. No adenopathy. There is aortic atherosclerosis. No bone lesions. IMPRESSION: Widespread airspace opacity bilaterally with overall slight increase in opacity bilaterally compared to 2 days prior. Suspect widespread pneumonia, although a degree of alveolar edema superimposed is quite possible. Stable small left pleural effusion. Stable cardiac silhouette. No adenopathy demonstrable. Aortic Atherosclerosis (ICD10-I70.0). Electronically Signed   By: Lowella Grip III M.D.   On: Jun 05, 2019 13:45   Scheduled Meds: . arformoterol  15 mcg Nebulization BID  . budesonide (PULMICORT) nebulizer solution  0.5 mg Nebulization BID  . Chlorhexidine Gluconate Cloth  6 each Topical Daily  . LORazepam  1 mg  Intravenous Q4H  . mouth rinse  15 mL Mouth Rinse BID  . methylPREDNISolone (SOLU-MEDROL) injection  125 mg Intravenous Q8H  . sodium chloride flush  3 mL Intravenous Q12H  . sodium chloride flush  3 mL Intravenous Q12H   Continuous Infusions: . sodium chloride 10 mL/hr at 05/20/19 1900  . dexmedetomidine (PRECEDEX) IV infusion Stopped (Jun 05, 2019 1051)  . morphine 5 mg/hr (06/05/2019 1057)    LOS: 5 days   Kerney Elbe, DO [facility] PAGER is on [place]  If 7PM-7AM, please contact night-coverage [email or web address] Password [email or web address] Jun 05, 2019, 2:50 PM

## 2019-05-28 NOTE — Progress Notes (Addendum)
Verbal order given to day shift RN Ruby by Dr. Alfredia Ferguson that RN may place foley catheter if needed for pt comfort. This was not done on day shift because pt was able to use purewick. On night shift, pt has become increasingly confused and has made repeated attempts to get OOB saying "I have to pee." When she does this, her SpO2 decreases to 50% and she has extreme SOB. This results in pt requiring prn medications for SOB and an increase in her FiO2. It takes hours for pt's SpO2 to recover and for her FiO2 to be weaned back down. Multiple attempts made to get pt to use purewick with no success. I and O cath considered, however, pt does not tolerate position changes or being laid flat at this time. Decision was made to lay pt flat one time and place foley catheter(from order from day shift MD) instead of laying her flat every time an I and O cath is needed. 14Fr foley placed yielding 530 cc clear yellow urine. Will continue to monitor pt.

## 2019-05-28 NOTE — Progress Notes (Addendum)
ADDENDUM 06-11-2019: 2043  I called to check on Ms. Dowling this evening and RN reported that she was having some increased WOB.  I came to her room and assessed her.  She was not distress as she was sedated, but certainly with increased WOB, clavicular pulling, etc.  Attempted multiple interventions including increase in opioids, trial of dilaudid, and breathing treatment without success.  I recommended starting ativan infusion for sedation to ensure comfort and family in agreement.  Discussed that NRB was likely adding time, but not quality to her life while also causing excessive dryness.  Family in agreement with transition back to [place] only.  She appeared more comfortable with transition back to [place].  Time in: 1920 Time out: 2020 Total time: 60 additional minutes of direct patient care in addition to 55 minutes from encounter documented below. Micheline Rough, MD [place] Team [phone]                                                                                                                                                                                                            Daily Progress Note   Patient Name: [name]       Date: 2019/06/11 DOB: [birth year]  Age: 73 y.o. MRN#: [id number] Attending Physician: Kerney Elbe, DO Primary Care Physician: Robyne Peers, MD Admit Date: 05/22/2019  Reason for Consultation/Follow-up: GOC and symptom management  Subjective: I saw and examined Ms. Pereda today.  She appears worse and has increased WOB.    Dr. Alfredia Ferguson and I discussed with family, including husband, sister, and daughter.  They share my concern that she is worsening and is more uncomfortable.    Reviewed her clinical course, and we discussed options of continuation of current interventions vs transitioning to full comfort care.    Family is clear that primary goal moving forward is comfort and dignity and that she does not  suffer.  They are in agreement with transition to full comfort care.  Length of Stay: 5  Current Medications: Scheduled Meds:  . arformoterol  15 mcg Nebulization BID  . budesonide (PULMICORT) nebulizer solution  0.5 mg Nebulization BID  . Chlorhexidine Gluconate Cloth  6 each Topical Daily  . ipratropium-albuterol  3 mL Nebulization Q6H WA  . LORazepam  1 mg Intravenous Q4H  . mouth rinse  15 mL Mouth Rinse BID  . methylPREDNISolone (SOLU-MEDROL) injection  125 mg Intravenous Q8H  . sodium chloride flush  3 mL Intravenous Q12H  . sodium chloride flush  3 mL  Intravenous Q12H    Continuous Infusions: . sodium chloride 10 mL/hr at 05/20/19 1900  . dexmedetomidine (PRECEDEX) IV infusion Stopped (06/12/2019 1051)  . morphine 5 mg/hr (06-12-19 1057)    PRN Meds: sodium chloride, acetaminophen **OR** acetaminophen, alum & mag hydroxide-simeth, antiseptic oral rinse, glycopyrrolate **OR** glycopyrrolate **OR** glycopyrrolate, haloperidol **OR** haloperidol **OR** haloperidol lactate, HYDROmorphone (DILAUDID) injection, lip balm, LORazepam, morphine, ondansetron **OR** ondansetron (ZOFRAN) IV, phenol, polyvinyl alcohol, sodium chloride flush  Physical Exam         General: Does not arouse this AM, Ill appearing, in moderate resp distress.  Heart: Regular rate and rhythm.  Lungs: Decreased, coarse Abdomen: Soft, nontender, nondistended, positive bowel sounds.  Ext: No significant edema Skin: Warm and dry  Vital Signs: BP (!) 109/43 (BP Location: Left Leg)   Pulse 86   Temp (!) 97 F (36.1 C) (Axillary)   Resp (!) 29   Ht 5\' 4"  (1.626 m)   Wt 88.1 kg   SpO2 (!) 66% Comment: Attempted to place on HFNC only, pt didnt tolerate, NRBM  BMI 33.34 kg/m  SpO2: SpO2: (!) 66 %(Attempted to place on HFNC only, pt didnt tolerate, NRBM) O2 Device: O2 Device: High Flow Nasal Cannula, NRB O2 Flow Rate: O2 Flow Rate (L/min): 6 L/min  Intake/output summary:   Intake/Output Summary (Last 24  hours) at Jun 12, 2019 1225 Last data filed at June 12, 2019 1054 Gross per 24 hour  Intake 935.38 ml  Output 2030 ml  Net -1094.62 ml   LBM: Last BM Date: 05/15/19 Baseline Weight: Weight: 88.1 kg Most recent weight: Weight: 88.1 kg       Palliative Assessment/Data:    Flowsheet Rows     Most Recent Value  Intake Tab  Referral Department  Hospitalist  Unit at Time of Referral  ICU  Palliative Care Primary Diagnosis  Pulmonary  Date Notified  05/18/19  Palliative Care Type  New Palliative care  Reason for referral  Clarify Goals of Care  Date of Admission  05/15/2019  Date first seen by Palliative Care  05/19/19  # of days Palliative referral response time  1 Day(s)  # of days IP prior to Palliative referral  2  Clinical Assessment  Palliative Performance Scale Score  20%  Psychosocial & Spiritual Assessment  Palliative Care Outcomes  Patient/Family meeting held?  Yes  Who was at the meeting?  Husband, daughter, patient  Palliative Care Outcomes  Clarified goals of care, Improved non-pain symptom therapy      Patient Active Problem List   Diagnosis Date Noted  . Hypokalemia 05/08/2019  . Prolonged QT interval 05/27/2019  . Chronic pain 05/23/2019  . Hypoxemia   . Acute on chronic respiratory failure with hypoxia ([place]) 05/14/2019  . Lymphocytic colitis 05/11/2019  . Essential hypertension 05/11/2019  . Iron deficiency anemia due to chronic blood loss 05/11/2019  . Hypothyroidism 05/11/2019  . CAP (community acquired pneumonia) 05/10/2019  . Pneumonia 05/09/2019  . Obese 05/03/2019  . S/P right TKA 05/02/2019  . Asthma 02/17/2019  . Flu-like symptoms 09/26/2018  . ILD (interstitial lung disease) ([place]) 09/26/2018  . Tinnitus, bilateral 10/20/2016  . Excessive cerumen in both ear canals 10/20/2016  . Benign essential tremor 06/09/2016  . Benign paroxysmal positional vertigo 06/09/2016  . History of benign brain tumor 06/09/2016  . Gait disturbance 06/09/2016  .  Trimalleolar fracture of ankle, closed 04/03/2015  . Lumbar radiculopathy 04/19/2013  . DIARRHEA 09/15/2010  . PERSONAL HISTORY OF FAILED MODERATE SEDATION 09/15/2010  . DIARRHEA-PRESUMED  INFECTIOUS 07/24/2010  . HEMORRHOIDS-INTERNAL 12/10/2008  . HEMORRHOIDS-EXTERNAL 12/10/2008  . PERSONAL HX COLONIC POLYPS 12/10/2008  . COLONIC POLYPS, HYPERPLASTIC 11/22/2008  . HYPERLIPIDEMIA 11/22/2008  . ANXIETY 11/22/2008  . DEPRESSION 11/22/2008  . ABNORMAL HEART RHYTHMS 11/22/2008  . HEMORRHOIDS 11/22/2008  . GERD 11/22/2008  . Diaphragmatic hernia 11/22/2008  . HYPERTENSION NEC 11/22/2008    Palliative Care Assessment & Plan   Patient Profile: 73 y.o. 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  Palliative consulted for [place].   Assessment: Patient Active Problem List   Diagnosis Date Noted  . Hypokalemia 05/20/2019  . Prolonged QT interval 05/26/2019  . Chronic pain 05/08/2019  . Hypoxemia   . Acute on chronic respiratory failure with hypoxia ([place]) 05/14/2019  . Lymphocytic colitis 05/11/2019  . Essential hypertension 05/11/2019  . Iron deficiency anemia due to chronic blood loss 05/11/2019  . Hypothyroidism 05/11/2019  . CAP (community acquired pneumonia) 05/10/2019  . Pneumonia 05/09/2019  . Obese 05/03/2019  . S/P right TKA 05/02/2019  . Asthma 02/17/2019  . Flu-like symptoms 09/26/2018  . ILD (interstitial lung disease) ([place]) 09/26/2018  . Tinnitus, bilateral 10/20/2016  . Excessive cerumen in both ear canals 10/20/2016  . Benign essential tremor 06/09/2016  . Benign paroxysmal positional vertigo 06/09/2016  . History of benign brain tumor 06/09/2016  . Gait disturbance 06/09/2016  . Trimalleolar fracture of ankle, closed 04/03/2015  . Lumbar radiculopathy 04/19/2013  . DIARRHEA 09/15/2010  . PERSONAL HISTORY OF  FAILED MODERATE SEDATION 09/15/2010  . DIARRHEA-PRESUMED INFECTIOUS 07/24/2010  . HEMORRHOIDS-INTERNAL 12/10/2008  . HEMORRHOIDS-EXTERNAL 12/10/2008  . PERSONAL HX COLONIC POLYPS 12/10/2008  . COLONIC POLYPS, HYPERPLASTIC 11/22/2008  . HYPERLIPIDEMIA 11/22/2008  . ANXIETY 11/22/2008  . DEPRESSION 11/22/2008  . ABNORMAL HEART RHYTHMS 11/22/2008  . HEMORRHOIDS 11/22/2008  . GERD 11/22/2008  . Diaphragmatic hernia 11/22/2008  . HYPERTENSION NEC 11/22/2008    Recommendations/Plan: - DNR/DNI - Her clinical condition continues to worsen.  Family in agreement that her desire at this point would be to focus on comfort care. -SOB/Pain: Has been using oral morphine regularly but not able to swallow at this point.  Plan for initiation of morphine infusion with additional prn boluses as needed to maintain comfort. -Anxiety: Plan for scheduled ativan with additional breakthrough ativan prn. -Secretions: [place]ul as needed - Please call if any further symptom management needs.  Code Status:    Code Status Orders  (From admission, onward)         Start     Ordered   05/18/19 1019  Do not attempt resuscitation (DNR)  Continuous    Question Answer Comment  In the event of cardiac or respiratory ARREST Do not call a "code blue"   In the event of cardiac or respiratory ARREST Do not perform Intubation, CPR, defibrillation or ACLS   In the event of cardiac or respiratory ARREST Use medication by any route, position, wound care, and other measures to relive pain and suffering. May use oxygen, suction and manual treatment of airway obstruction as needed for comfort.      05/18/19 1018        Code Status History    Date Active Date Inactive Code Status Order ID Comments User Context   05/17/2019 1324 05/18/2019 1018 Partial Code [id number]  Donita Brooks, NP Inpatient   05/17/2019 0338 05/17/2019 1323 Full Code [id number]  Opyd,  Ilene Qua, MD Inpatient   05/10/2019 0200 05/13/2019 1742 Full  Code [id number]  Rise Patience, MD ED   05/02/2019 1047 05/03/2019 1724 Full Code [id number]  Rudi Coco Inpatient   04/04/2015 1559 04/05/2015 1928 Full Code [id number]  Wylene Simmer, MD Inpatient   04/03/2015 1134 04/04/2015 1554 Full Code [id number]  Corky Sing, PA-C Inpatient   Advance Care Planning Activity    Advance Directive Documentation     Most Recent Value  Type of Advance Directive  Healthcare Power of Attorney, Living will  Pre-existing out of facility DNR order (yellow form or pink MOST form)  -  "MOST" Form in Place?  -       Prognosis:  Hours to days  Discharge Planning: Anticipated Hospital Death   Care plan was discussed with RN, sister, daughter, husband, and Dr. Alfredia Ferguson  Thank you for allowing the Palliative Medicine Team to assist in the care of this patient.   Time In: 0900 Time Out: 0955 Total Time 55 Prolonged Time Billed No      Greater than 50%  of this time was spent counseling and coordinating care related to the above assessment and plan.  Micheline Rough, MD  Please contact Palliative Medicine Team phone at [phone] for questions and concerns.

## 2019-05-28 NOTE — Progress Notes (Signed)
During shift change, patient became anxious, short of breath, and extremely hypoxic. Night shift RN administered PRN ativan with some relief to the patient's anxiety during this time. PRN IV dilaudid given shortly after, patient still moaning and appeared to be uncomfortable. Family at bedside and in agreement with plan. Palliative also paged to notify of change in status from 05/20/2019. Palliative to round on patient this AM. Patient more comfortable at this time.  Will continue to monitor.

## 2019-05-28 NOTE — Death Summary Note (Signed)
DEATH SUMMARY   Patient Details  Name: [name] MRN: [id number] DOB: 17-Aug-1945  Admission/Discharge Information   Admit Date:  26-May-2019  Date of Death: Date of Death: 05/31/2019  Time of Death: Time of Death: 2136-10-29  Length of Stay: 5  Referring Physician: Robyne Peers, MD   Reason(s) for Hospitalization  Chills, increased Cough and SOB, increased O2 requirements  Diagnoses  Preliminary cause of death:    Acute cardio pulmonary failure in the setting of acute on chronic respiratory failure from interstitial lung disease along with concomitant pneumonia  Secondary Diagnoses (including complications and co-morbidities)  Sepsis 2/2 Multifocal PNA complicated in the setting of ILD and Pulmonary Fibrosis  Acute on chronic hypoxic respiratory failure now on heated high flow nasal cannula with 70 L and 100% FiO2  Hypertension  Normocytic Anemia  Prolonged QT interval   Lymphocytic Colitis   Hypothyroidism  HLD  Obesity  Anxiety and Depression  Elevated ALT  Hyperglycemia  Elevated BUN  Leukocytosis  Hypokalemia  Hypophosphatemia -> Hyperphosphatemia  Hyponatremia  Hypercalcemia  Principal Problem:   Pneumonia Active Problems:   ILD (interstitial lung disease) ([facility])   Essential hypertension   Hypothyroidism   Acute on chronic respiratory failure with hypoxia ([facility])   Hypokalemia   Prolonged QT interval   Chronic pain   Brief Hospital Course (including significant findings, care, treatment, and services provided and events leading to death)  HPI per Dr. Mitzi Hansen on May 26, 2019 [name] a 73 y.o.femalewith medical history significant forinterstitial lung disease, hypothyroidism, lymphocytic colitis,hypertension, and recent admission with pneumonia, discharged on 1 L of supplemental oxygen, now returning to the ED with chills, worsened dyspnea and cough, and increased supplemental oxygen requirement.Patient  reports that she was feeling much improved when she left the hospital on 05/13/2019 and continued to do well back at home until last night when she developed shaking chills. She also has increase in her shortness of breath and nonproductive cough over the past 24 hours. She denies any chest pain, leg swelling, or calf tenderness. No one with fever or respiratory symptoms at home.She denies abdominal pain, vomiting, or diarrhea since the recent admission.  ED Course:Upon arrival to the ED, patient is found to be febrile to 38.2C,requiring 3 L/min of supplemental oxygen to maintain saturations in the low 90s, mildly tachypneic, and with stable blood pressure. EKG features a sinus rhythm with a QTc interval of 550 ms. Chest x-ray is concerning for extensive bilateral peripheral hazy infiltrates consistent with markedly progressed pneumonia. Chemistry panel is notable for potassium of 2.9. CBC features a leukocytosis to 15,400 and a stable normocytic anemia. Blood cultures were collected, 3 L normal saline administered, and the patient was treated with acetaminophen, cefepime, and vancomycin. COVID-19 testing has been ordered but not yet performed.  **Interim History Continued to be tachypneic and wearing supplemental oxygen via nasal cannula but worsened and had to be placed on a heated high flow nasal cannula with 40 L and 100% FiO2 but she requested to go back to 15 Liters +NRB and has been desaturating even on that.  She is now is back on heated high flow nasal cannula and she ended up going to 70 L with 100% FiO2 and this is slowly been titrating but O2 saturation continues to worsen   Pulmonary was consulted for further evaluation recommendations.  SARS-CoV-2 testing was negative now being diuresed and given IV 40 mg Lasix yesterday AM.  Antibiotics were escalated to IV Cefepime and pulmonary  added adding Brovana and Pulmicort in addition to her DuoNebs.  Have also changed her IV  dexamethasone to IV Solu-Medrol and now on 125 mg q4h x3 Days and this is day 230.  Echocardiogram was repeated and patient was made a DNR and has a very poor prognosis.  Continues to be dyspneic so morphine was added in addition to her Ativan and palliative care was consulted for further goals of care discussion and symptom management.  The night before last the patient had to be put on Precedex drip and she is started on oral morphine yesterday for her dyspnea worsening breathing and this has been helping her.  Her condition continued to worsen despite aggressive interventions and after discussion with family and Palliative a decision was made to primarily focus on the patient's comfort and dignity and transition to full comfort care and measures.   Patient was transitioned to full comfort measures and subsequently passed away on 05/28/19 at [id number].  Pertinent Labs and Studies  Significant Diagnostic Studies Dg Chest 2 View  Result Date: 05/20/2019 CLINICAL DATA:  Shortness of breath. Pulmonary fibrosis. EXAM: CHEST - 2 VIEW COMPARISON:  Radiographs dated 05/09/2019 and CT scan dated 05/10/2019 FINDINGS: There are extensive bilateral peripheral hazy pulmonary infiltrates consistent with pneumonia, markedly progressed since the prior chest x-ray and chest CT. No effusions. Heart size and pulmonary vascularity are normal. No bone abnormality. IMPRESSION: Extensive bilateral peripheral hazy pulmonary infiltrates consistent with pneumonia, markedly progressed since the prior chest x-ray and chest CT scan. Electronically Signed   By: Lorriane Shire M.D.   On: 05/20/2019 18:17   Dg Chest 2 View  Result Date: 05/09/2019 CLINICAL DATA:  73 year old female with sepsis. Fever. EXAM: CHEST - 2 VIEW COMPARISON:  Chest radiographs 02/28/2019 and earlier. Chest CT 04/05/2019. FINDINGS: Portable AP semi upright view at 2242 hours. Moderate chronic hiatal hernia. Other mediastinal contours are within normal limits.  Visualized tracheal air column is within normal limits. Stable lung volumes. Chronic increased interstitial markings with pulmonary fibrosis suspected on CT last month. New 5 centimeter area of confluent opacity in the left upper lobe partially abutting the hilum. No superimposed pneumothorax or pleural effusion. No other confluent opacity. Paucity of bowel gas in the upper abdomen. IMPRESSION: 1. New 5 cm area of opacity in the left upper lung since the CT last month is compatible with upper lobe pneumonia. No pleural effusion. 2. Underlying pulmonary fibrosis. 3. Moderate chronic hiatal hernia. Electronically Signed   By: Genevie Ann M.D.   On: 05/09/2019 23:02   Ct Angio Chest Pe W Or Wo Contrast  Result Date: 05/10/2019 CLINICAL DATA:  Shortness of breath with fever.  Recent knee surgery EXAM: CT ANGIOGRAPHY CHEST WITH CONTRAST TECHNIQUE: Multidetector CT imaging of the chest was performed using the standard protocol during bolus administration of intravenous contrast. Multiplanar CT image reconstructions and MIPs were obtained to evaluate the vascular anatomy. CONTRAST:  173mL OMNIPAQUE IOHEXOL 350 MG/ML SOLN COMPARISON:  04/05/2019 FINDINGS: Cardiovascular: Suboptimal pulmonary artery opacification in the upper lobes with no confidence for detecting emboli beyond the lobar to proximal segmental level. The other vessels are well opacified and negative for filling defect. Normal heart size. No pericardial effusion. Mild atherosclerotic calcification of the aorta. Mediastinum/Nodes: Moderate hiatal hernia. No worrisome lymph nodes. Lungs/Pleura: There is a background of interstitial opacity with architectural distortion and traction bronchiectasis on prior CT. There is now superimposed patchy airspace disease in the bilateral lungs. No acute septal thickening for edema. No pleural  effusion or pneumothorax Upper Abdomen: No acute finding Musculoskeletal: No acute or aggressive finding Review of the MIP images  confirms the above findings. IMPRESSION: 1. Acute patchy airspace disease, favor atypical pneumonia. Given there is a background of chronic interstitial lung disease acute alveolitis is also considered. 2. No evidence for pulmonary embolism but notably limited in the upper lobes due to bolus timing. Electronically Signed   By: Monte Fantasia M.D.   On: 05/10/2019 04:18   Dg Chest Port 1 View  Result Date: 05-27-19 CLINICAL DATA:  Shortness of breath EXAM: PORTABLE CHEST 1 VIEW COMPARISON:  May 19, 2019 FINDINGS: There is extensive airspace opacity throughout the lungs bilaterally with areas of consolidation somewhat greater on the left than on the right. There is new patchy airspace opacity in the right base compared to 2 days prior. There is also slightly more opacification in the left upper lobe. There is a small left pleural effusion. Heart is upper normal in size with pulmonary vascularity normal. No adenopathy. There is aortic atherosclerosis. No bone lesions. IMPRESSION: Widespread airspace opacity bilaterally with overall slight increase in opacity bilaterally compared to 2 days prior. Suspect widespread pneumonia, although a degree of alveolar edema superimposed is quite possible. Stable small left pleural effusion. Stable cardiac silhouette. No adenopathy demonstrable. Aortic Atherosclerosis (ICD10-I70.0). Electronically Signed   By: Lowella Grip III M.D.   On: 05-27-2019 13:45   Dg Chest Port 1 View  Result Date: 05/19/2019 CLINICAL DATA:  Respiratory failure EXAM: PORTABLE CHEST 1 VIEW COMPARISON:  05/18/2019 FINDINGS: Cardiac shadow is stable. Persistent patchy airspace disease is noted throughout both lungs slightly improved when compare with the prior exam. Central vascular congestion is noted. No bony abnormality is seen. IMPRESSION: Patchy multifocal infiltrates slightly improved when compared with the prior exam. Electronically Signed   By: Inez Catalina M.D.   On: 05/19/2019  07:17   Dg Chest Port 1 View  Result Date: 05/18/2019 CLINICAL DATA:  Shortness of breath. EXAM: PORTABLE CHEST 1 VIEW COMPARISON:  Radiograph yesterday. CT 05/10/2019 FINDINGS: Slight worsening of bilateral heterogeneous airspace disease since yesterday. Unchanged heart size and mediastinal contours. No pneumothorax or large pleural effusion. Unchanged osseous structures. IMPRESSION: Slight worsening of bilateral heterogeneous airspace disease since yesterday, suspicious for multifocal pneumonia. An element of superimposed pulmonary edema is also considered. Electronically Signed   By: Keith Rake M.D.   On: 05/18/2019 05:49   Dg Chest Port 1 View  Result Date: 05/17/2019 CLINICAL DATA:  Shortness of breath. EXAM: PORTABLE CHEST 1 VIEW COMPARISON:  May 16, 2019. FINDINGS: Stable cardiomediastinal silhouette. Stable diffuse bilateral lung opacities are noted concerning for multifocal pneumonia. No pneumothorax or significant pleural effusion is noted. Bony thorax unremarkable. IMPRESSION: Stable diffuse bilateral lung opacities are noted concerning for multifocal pneumonia. Electronically Signed   By: Marijo Conception M.D.   On: 05/17/2019 10:54   Dg Knee Complete 4 Views Right  Result Date: 05/09/2019 CLINICAL DATA:  Swelling fever EXAM: RIGHT KNEE - COMPLETE 4+ VIEW COMPARISON:  None. FINDINGS: Status post knee replacement. Normal alignment and no fracture. Generalized soft tissue swelling. Possible knee effusion. IMPRESSION: 1. Status post right knee replacement without acute osseous abnormality 2. Diffuse soft tissue swelling with possible knee effusion Electronically Signed   By: Donavan Foil M.D.   On: 05/09/2019 23:01   ECHOCARDIOGRAM IMPRESSIONS   1. Left ventricular ejection fraction, by visual estimation, is 65 to 70%. The left ventricle has hyperdynamic function. There is mildly increased left  ventricular hypertrophy. 2. Elevated left atrial and left ventricular  end-diastolic pressures. 3. Left ventricular diastolic Doppler parameters are consistent with impaired relaxation pattern of LV diastolic filling. 4. Global right ventricle has normal systolic function.The right ventricular size is normal. No increase in right ventricular wall thickness. 5. Left atrial size was normal. 6. Right atrial size was normal. 7. Trivial pericardial effusion is present. 8. Mild mitral annular calcification. 9. The mitral valve is abnormal. Trace mitral valve regurgitation. 10. The tricuspid valve is grossly normal. Tricuspid valve regurgitation is trivial. 11. The aortic valve is tricuspid Aortic valve regurgitation was not visualized by color flow Doppler. Mild aortic valve sclerosis without stenosis. 12. The pulmonic valve was grossly normal. Pulmonic valve regurgitation is not visualized by color flow Doppler. 13. The inferior vena cava is dilated in size with >50% respiratory variability, suggesting right atrial pressure of 8 mmHg.  FINDINGS Left Ventricle: Left ventricular ejection fraction, by visual estimation, is 65 to 70%. The left ventricle has hyperdynamic function. There is mildly increased left ventricular hypertrophy. Spectral Doppler shows Left ventricular diastolic Doppler  parameters are consistent with impaired relaxation pattern of LV diastolic filling. Elevated left atrial and left ventricular end-diastolic pressures.  Right Ventricle: The right ventricular size is normal. No increase in right ventricular wall thickness. Global RV systolic function is has normal systolic function.  Left Atrium: Left atrial size was normal in size.  Right Atrium: Right atrial size was normal in size  Pericardium: Trivial pericardial effusion is present.  Mitral Valve: The mitral valve is abnormal. There is mild thickening of the mitral valve leaflet(s). Mild mitral annular calcification. Trace mitral valve regurgitation.  Tricuspid Valve: The  tricuspid valve is grossly normal. Tricuspid valve regurgitation is trivial by color flow Doppler.  Aortic Valve: The aortic valve is tricuspid. Aortic valve regurgitation was not visualized by color flow Doppler. Mild aortic valve sclerosis is present, with no evidence of aortic valve stenosis.  Pulmonic Valve: The pulmonic valve was grossly normal. Pulmonic valve regurgitation is not visualized by color flow Doppler.  Aorta: The aortic root is normal in size and structure.  Venous: The inferior vena cava is dilated in size with greater than 50% respiratory variability, suggesting right atrial pressure of 8 mmHg.  IAS/Shunts: No atrial level shunt detected by color flow Doppler.    LEFT VENTRICLE PLAX 2D LVIDd: 3.90 cm Diastology LVIDs: 2.70 cm LV e' lateral: 6.31 cm/s LV PW: 1.00 cm LV E/e' lateral: 17.0 LV IVS: 1.10 cm LV e' medial: 5.66 cm/s LVOT diam: 2.20 cm LV E/e' medial: 18.9 LV SV: 39 ml LV SV Index: 19.16 LVOT Area: 3.80 cm   RIGHT VENTRICLE RV Basal diam: 2.40 cm RV S prime: 14.60 cm/s TAPSE (M-mode): 1.9 cm  LEFT ATRIUM Index LA diam: 3.90 cm 2.02 cm/m LA Vol (A2C): 58.6 ml 30.33 ml/m LA Vol (A4C): 49.6 ml 25.67 ml/m LA Biplane Vol: 57.8 ml 29.91 ml/m AORTIC VALVE LVOT Vmax: 149.00 cm/s LVOT Vmean: 101.000 cm/s LVOT VTI: 0.327 m  AORTA Ao Root diam: 3.40 cm Ao Asc diam: 3.30 cm  MITRAL VALVE MV Area (PHT): 2.76 cm SHUNTS MV PHT: 79.75 msec Systemic VTI: 0.33 m MV Decel Time: 275 msec Systemic Diam: 2.20 cm MV E velocity: 107.00 cm/s 103 cm/s MV A velocity: 113.00 cm/s 70.3 cm/s MV E/A ratio: 0.95 1.5  Microbiology Recent Results (from the past 240 hour(s))  Blood culture (routine x 2)     Status: None   Collection Time: 05/04/2019  6:55 PM   Specimen: BLOOD LEFT FOREARM  Result  Value Ref Range Status   Specimen Description   Final    BLOOD LEFT FOREARM Performed at [facility] [address], [place], [place] [address]    Special Requests   Final    BOTTLES DRAWN AEROBIC AND ANAEROBIC Blood Culture adequate volume Performed at [facility] [address] [place], [place] [address]    Culture   Final    NO GROWTH 5 DAYS Performed at [facility], [place] [address], [place], [place] [address]    Report Status 06-08-19 FINAL  Final  Blood culture (routine x 2)     Status: None   Collection Time: 05/13/2019  6:55 PM   Specimen: BLOOD LEFT FOREARM  Result Value Ref Range Status   Specimen Description   Final    BLOOD LEFT FOREARM Performed at [facility] [address], [place], [place] [address]    Special Requests   Final    BOTTLES DRAWN AEROBIC AND ANAEROBIC Blood Culture adequate volume Performed at [facility] [address], [place], [place] [address]    Culture   Final    NO GROWTH 5 DAYS Performed at [facility], [place] [address] [place], [place] [address]    Report Status 06-08-19 FINAL  Final  SARS CORONAVIRUS 2 (TAT 6-24 HRS) Nasopharyngeal Nasopharyngeal Swab     Status: None   Collection Time: 05/05/2019 11:24 PM   Specimen: Nasopharyngeal Swab  Result Value Ref Range Status   SARS Coronavirus 2 NEGATIVE NEGATIVE Final    Comment: (NOTE) SARS-CoV-2 target nucleic acids are NOT DETECTED. The SARS-CoV-2 RNA is generally detectable in upper and lower respiratory specimens during the acute phase of infection. Negative results do not preclude SARS-CoV-2 infection, do not rule out co-infections with other pathogens, and should not be used as the sole basis for treatment or other patient management decisions. Negative results must be combined with clinical observations, patient history, and epidemiological information. The expected result is  Negative. Fact Sheet for Patients: SugarRoll.be Fact Sheet for Healthcare Providers: https://www.woods-mathews.com/ This test is not yet approved or cleared by the [place] FDA and  has been authorized for detection and/or diagnosis of SARS-CoV-2 by FDA under an Emergency Use Authorization (EUA). This EUA will remain  in effect (meaning this test can be used) for the duration of the COVID-19 declaration under Section 56 4(b)(1) of the Act, 21 U.S.C. section 360bbb-3(b)(1), unless the authorization is terminated or revoked sooner. Performed at [facility], [place] [address], [place], [place] [address]   Urine culture     Status: Abnormal   Collection Time: 05/17/19  6:00 AM   Specimen: Urine, Random  Result Value Ref Range Status   Specimen Description   Final    URINE, RANDOM Performed at [facility] [address], [place], [place] [address]    Special Requests   Final    NONE Performed at [facility], [place] [address], [place], [place] [address]    Culture (A)  Final    <10,000 COLONIES/mL INSIGNIFICANT GROWTH Performed at [facility] [address], [place],  [address]    Report Status 05/18/2019 FINAL  Final  MRSA PCR Screening     Status: None   Collection Time: 05/17/19  6:00 AM   Specimen: Nasopharyngeal  Result Value Ref Range Status   MRSA  by PCR NEGATIVE NEGATIVE Final    Comment:        The GeneXpert MRSA Assay (FDA approved for NASAL specimens only), is one component of a comprehensive MRSA colonization surveillance program. It is not intended to diagnose MRSA infection nor to guide or monitor treatment for MRSA infections. Performed at [facility], [place] [address] [place], [place] [address]    Lab Basic Metabolic Panel: Recent Labs  Lab 05/17/19 [id number] 05/18/19 0209 05/19/19 0215 05/20/19 0159 2019/05/25 0217  NA 136 135 134*  133* 135  K 3.0* 3.7 4.5 5.2* 4.7  CL 102 101 100 100 103  CO2 23 24 24 24 22   GLUCOSE 137* 205* 256* 250* 286*  BUN 11 17 23  37* 74*  CREATININE 0.69 0.60 0.72 0.58 0.86  CALCIUM 9.4 10.3 11.0* 10.9* 10.7*  MG 2.0 2.3 2.3 2.6* 2.9*  PHOS 2.4* 2.8 2.8 3.1 4.7*   Liver Function Tests: Recent Labs  Lab 05/14/2019 2100 05/18/19 0209 05/19/19 0215 05/20/19 0159 2019-05-25 0217  AST 34 32 40 32 27  ALT 58* 52* 56* 50* 51*  ALKPHOS 114 128* 121 127* 148*  BILITOT 0.5 0.5 0.4 0.5 0.6  PROT 6.6 7.1 6.7 6.4* 5.8*  ALBUMIN 2.8* 2.8* 2.6* 2.5* 2.4*   No results for input(s): LIPASE, AMYLASE in the last 168 hours. No results for input(s): AMMONIA in the last 168 hours. CBC: Recent Labs  Lab 05/17/19 0608 05/18/19 0209 05/19/19 0215 05/20/19 0159 25-May-2019 0217  WBC 16.0* 19.6* 20.3* 23.6* 23.6*  NEUTROABS 13.7* 16.9* 18.5* 20.7* 19.5*  HGB 9.1* 8.9* 8.6* 8.2* 7.1*  HCT 29.9* 29.8* 29.0* 27.1* 23.7*  MCV 92.3 92.5 92.4 92.8 94.0  PLT 376 426* 419* 453* 318   Cardiac Enzymes: No results for input(s): CKTOTAL, CKMB, CKMBINDEX, TROPONINI in the last 168 hours. Sepsis Labs: Recent Labs  Lab 04/30/2019 1855  05/18/19 0209 05/19/19 0215 05/20/19 0159 05/25/2019 0217  PROCALCITON  --   --  1.06 0.55  --   --   WBC 15.4*   < > 19.6* 20.3* 23.6* 23.6*  LATICACIDVEN 1.3  --   --   --   --   --    < > = values in this interval not displayed.    Procedures/Operations  As Above  [employer] 05/22/2019, 7:24 AM

## 2019-05-28 NOTE — Progress Notes (Signed)
Oddis Westling RN and Jennye Moccasin RN at bedside to pronounce time of death 06/09/19 at [id number]. Both RNs auscultated and palpated patient and found there was no longer breath sounds or a pulse. Family was also at bedside. Attending provider and CDS notified.

## 2019-05-28 NOTE — Progress Notes (Signed)
NAME:  [name], MRN:  [id number], DOB:  Nov 14, 1945, LOS: 5 ADMISSION DATE:  05/02/2019, CONSULTATION DATE:  05/17/19 REFERRING MD:  Dr. Alfredia Ferguson, CHIEF COMPLAINT:  Hypoxic Respiratory Failure    Brief History    73 y/o F who presented to [facility] on 10/20 with reports of low oxygen saturations and shortness of breath.    The patient underwent a right total knee replacement on 10/6 per Dr. Alvan Dame.  Additionally, she was recently admitted from 10/13-10/17 with concern for possible PNA.  She was treated with ceftriaxone and azithromycin.  She was COVID negative during that admission.  CTA of the chest 10/14 showed patchy diffuse bilateral airspace disease.  After discharge home, she noted that her O2 levels were running 85-88% despite increasing her oxygen.  She returns to the ER 10/20 with increased SOB and low O2 levels. Work up concerning for possible PNA.  O2 needs noted to increase to 6L.    PCCM consulted for evaluation.   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 O2 increased to 8L 10/22 O2 increased to 15L salter + NRB. Did not tolerate heated high flow due to nose  Consults:  PCCM  Palliative care 10/23   Procedures:     Significant Diagnostic Tests:  CTA Chest 10/14 >> acute patchy airspace disease, favor atypical pneumonia, negative for PE ECHO 10/22 >> LVEF 65-70%, LV hyperdynamic function, mild LV hypertrophy, elevated LA/LVEDP, LV diastolic impaired relaxation, normal LA/RA, trivial pericardial effusion, trace MR, trivial TR, mild aortic valve sclerosis without stenosis, RA pressure ~ 33mHg  Micro Data:  COVID 10/20 >> negative  UC 10/21 >> less than 10k colonies, insignificant growth BCx2 10/20 >> NG Sputum 10/20 >> not obtainable    Antimicrobials:  Cefepime 10/20 x1  Vanco 10/20 x1  Rocephin 10/20 >> x one  Azithro 10/20 >> x one  Maxeime 10/21 >>>  Interim history/subjective:  On am rounds appears to be resting comfortably/ fm at bedside, no cough but back up to 100% FIO2   Objective   Blood pressure (!) 109/43, pulse 61, temperature (!) 97 F (36.1 C), temperature source Axillary, resp. rate (!) 26, height '5\' 4"'$  (1.626 m), weight 88.1 kg, SpO2 (!) 89 %.    FiO2 (%):  [90 %-100 %] 100 %   Intake/Output Summary (Last 24 hours) at [id number] Last data filed at 1November 17, 20201000 Gross per 24 hour  Intake 927.8 ml  Output 2030 ml  Net -1102.2 ml   Filed Weights   05/17/19 0415  Weight: 88.1 kg    Examination: Sleeping at 60 degrees hob No jvd Lungs with a distant diffuse insp crackles, no exp wheeze  RRR no s3 or or sign murmur Abd soft with nl  excursion  Extr warm with no edema or clubbing noted    Resolved Hospital Problem list      Assessment & Plan:    Acute on Chronic Hypoxic Respiratory Failure  Pulmonary Fibrosis  Underlying pulmonary fibrosis of unclear etiology with new superimposed diffuse bilateral infiltrates assoc with marked increase esr .  P: Ok to continue nosocomial abx coverage as troponin was > 0.50  Continue Pulmicort + Brovana BID, Duoneb Q6 Follow up  HSP panel w/a Resume solumedrol 125 mg IV q 8 empirically  given very high esr on admit  DNR in the event of arrest, no intubation     Anemia noted down 2 gm x 3 days c/w blood loss so d/c'd lovenox  and just using  PAS  - consider transfusion vs shift entirely to comfort rx pending visit by palliative care     Best practice:  Diet: Heart Healthy as tol Pain/Anxiety/Delirium protocol (if indicated): n/a  VAP protocol (if indicated): n/a  DVT prophylaxis: PAS  GI prophylaxis: n/a  Glucose control: n/a Mobility: as tolerated  Code Status: DNR Family Communication:  Fm at bedside/  comfortable with palliation and defer all aspects of this to  palliative care    Disposition: SDU   Labs   CBC: Recent Labs  Lab 05/17/19 0608 05/18/19 0209 05/19/19 0215 05/20/19 0159 01-Jun-2019 0217  WBC 16.0* 19.6* 20.3* 23.6* 23.6*  NEUTROABS 13.7* 16.9* 18.5* 20.7* 19.5*  HGB 9.1* 8.9* 8.6* 8.2* 7.1*  HCT 29.9* 29.8* 29.0* 27.1* 23.7*  MCV 92.3 92.5 92.4 92.8 94.0  PLT 376 426* 419* 453* 865    Basic Metabolic Panel: Recent Labs  Lab 05/17/19 0608 05/18/19 0209 05/19/19 0215 05/20/19 0159 June 01, 2019 0217  NA 136 135 134* 133* 135  K 3.0* 3.7 4.5 5.2* 4.7  CL 102 101 100 100 103  CO2 '23 24 24 24 22  '$ GLUCOSE 137* 205* 256* 250* 286*  BUN '11 17 23 '$ 37* 74*  CREATININE 0.69 0.60 0.72 0.58 0.86  CALCIUM 9.4 10.3 11.0* 10.9* 10.7*  MG 2.0 2.3 2.3 2.6* 2.9*  PHOS 2.4* 2.8 2.8 3.1 4.7*   GFR: Estimated Creatinine Clearance: 61.7 mL/min (by C-G formula based on SCr of 0.86 mg/dL). Recent Labs  Lab 05/11/2019 1855  05/18/19 0209 05/19/19 0215 05/20/19 0159 2019/06/01 0217  PROCALCITON  --   --  1.06 0.55  --   --   WBC 15.4*   < > 19.6* 20.3* 23.6* 23.6*  LATICACIDVEN 1.3  --   --   --   --   --    < > = values in this interval not displayed.    Liver Function Tests: Recent Labs  Lab 05/09/2019 2100 05/18/19 0209 05/19/19 0215 05/20/19 0159 2019/06/01 0217  AST 34 32 40 32 27  ALT 58* 52* 56* 50* 51*  ALKPHOS 114 128* 121 127* 148*  BILITOT 0.5 0.5 0.4 0.5 0.6  PROT 6.6 7.1 6.7 6.4* 5.8*  ALBUMIN 2.8* 2.8* 2.6* 2.5* 2.4*   No results for input(s): LIPASE, AMYLASE in the last 168 hours. No results for input(s): AMMONIA in the last 168 hours.  ABG    Component Value Date/Time   HCO3 26.6 05/04/2019 2045   TCO2 25 07/07/2012 0653   O2SAT 36.2 05/15/2019 2045     Coagulation Profile: Recent Labs  Lab 05/27/2019 2100  INR 1.3*    Cardiac Enzymes: No results for input(s): CKTOTAL, CKMB, CKMBINDEX, TROPONINI in the last 168 hours.  HbA1C: No results found for: HGBA1C  CBG: Recent Labs  Lab 05/18/19 0810  05/19/19 [place]* 205*       Christinia Gully, MD [facility] [phone] After 5:30 PM or weekends, use Beeper [phone]

## 2019-05-28 DEATH — deceased

## 2019-05-30 ENCOUNTER — Ambulatory Visit: Payer: Medicare Other | Admitting: Internal Medicine

## 2019-06-06 ENCOUNTER — Ambulatory Visit: Payer: Medicare Other | Admitting: Internal Medicine

## 2020-01-22 IMAGING — CR DG KNEE COMPLETE 4+V*R*
4 series · 4 of 4 positions shown · non-contrast
Comparison: None.

CLINICAL DATA: Swelling fever

EXAM:
RIGHT KNEE - COMPLETE 4+ VIEW

[x knee ap right (1 of 3)]
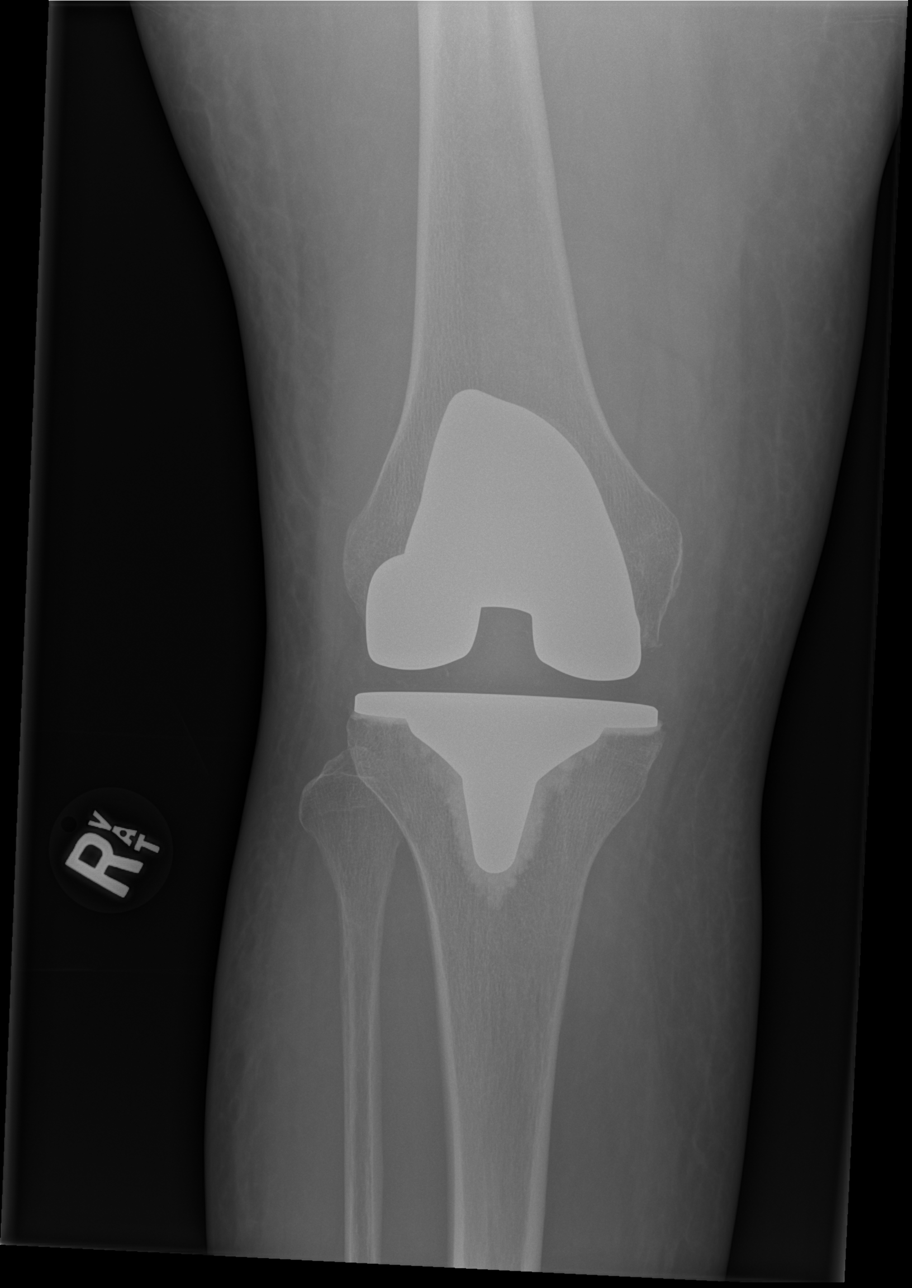

[x knee ap right (2 of 3)]
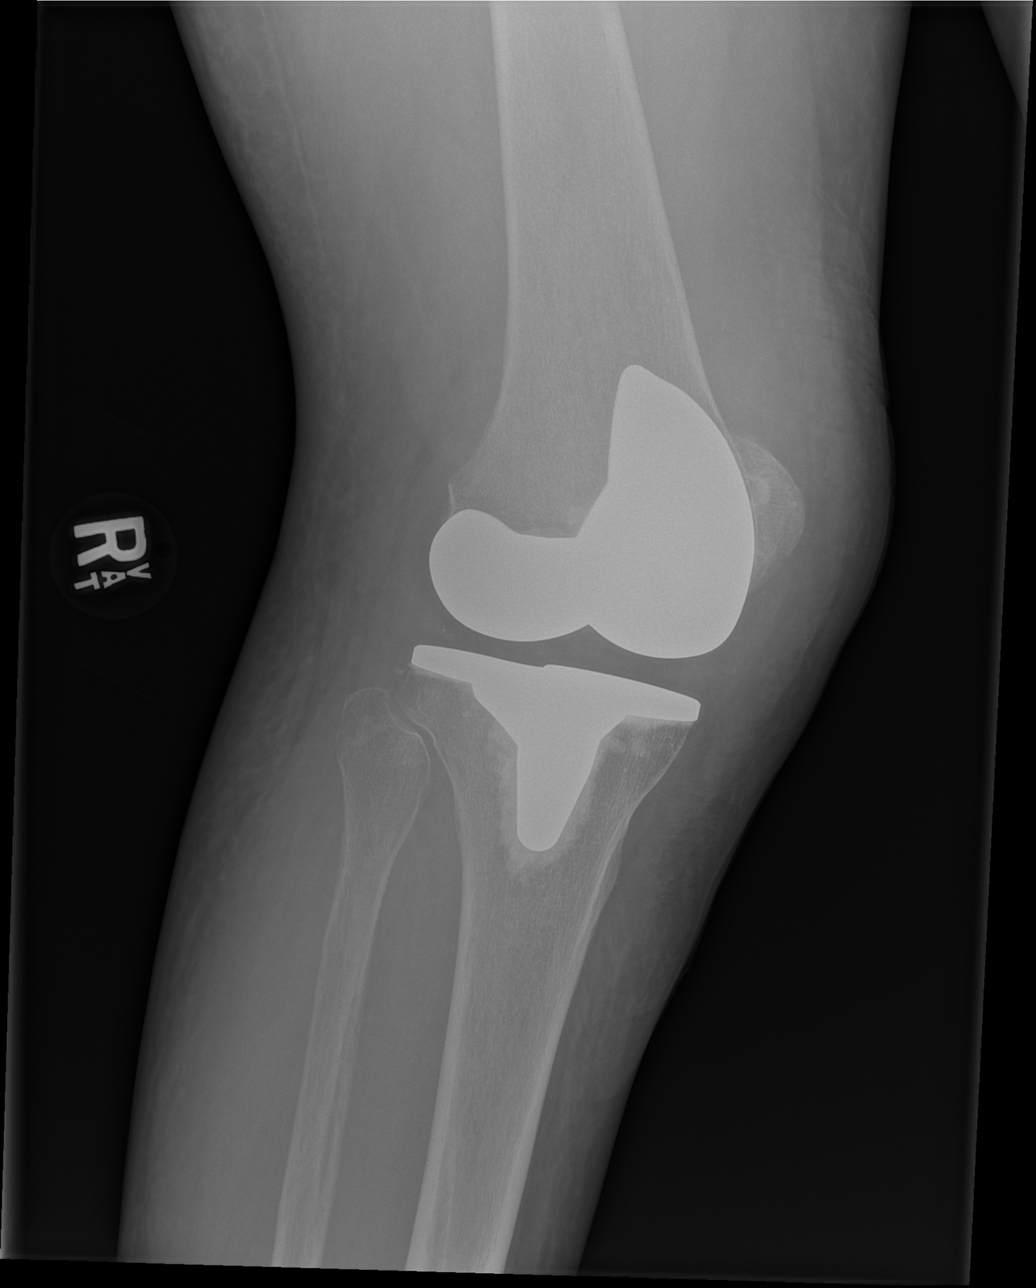

[x knee ap right (3 of 3)]
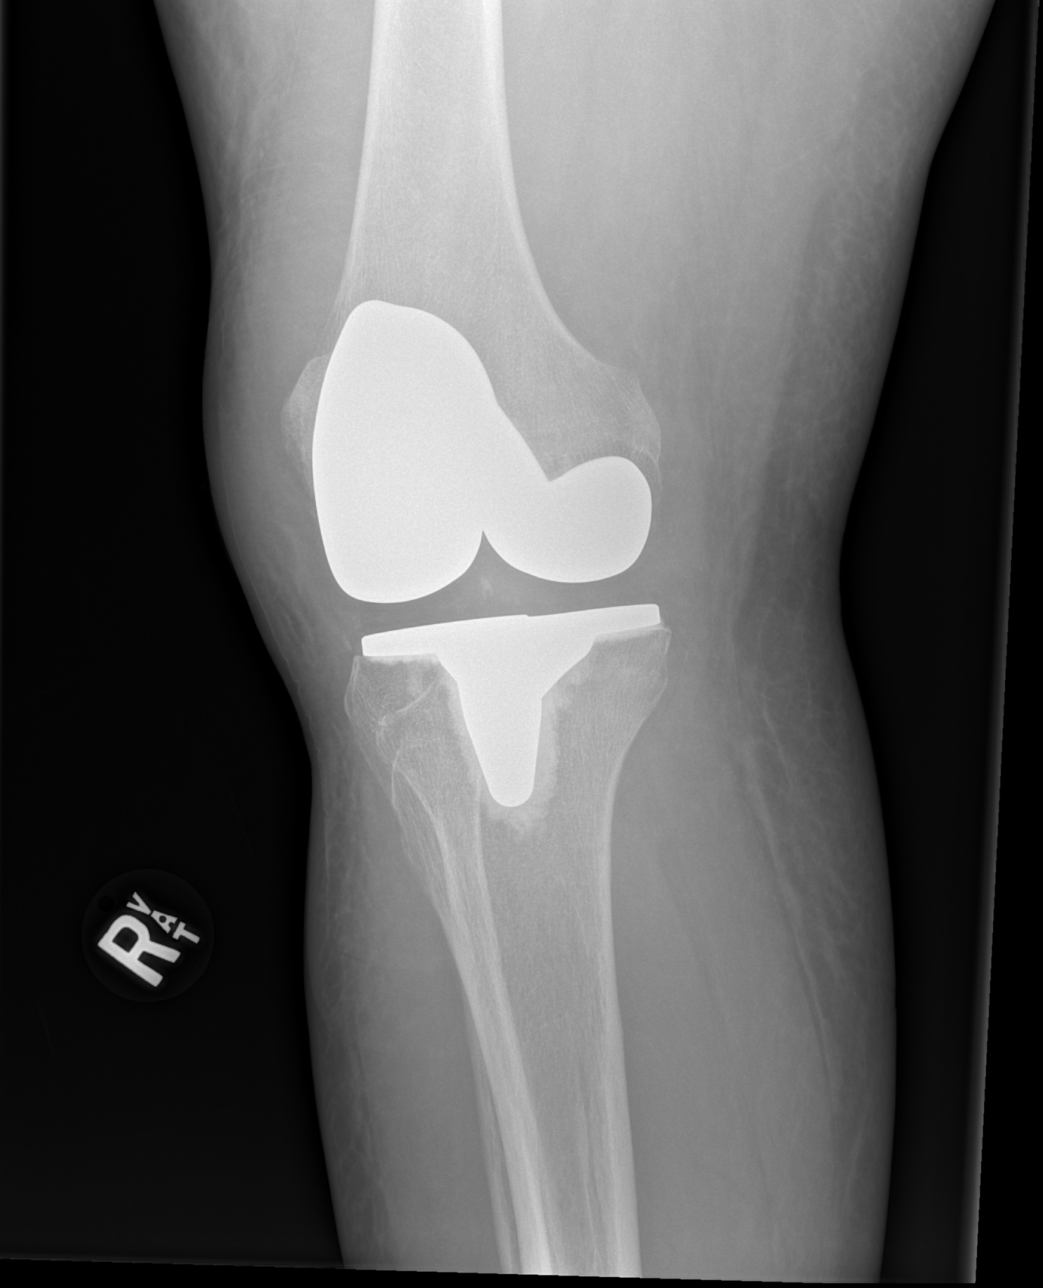

[x knee lat right]
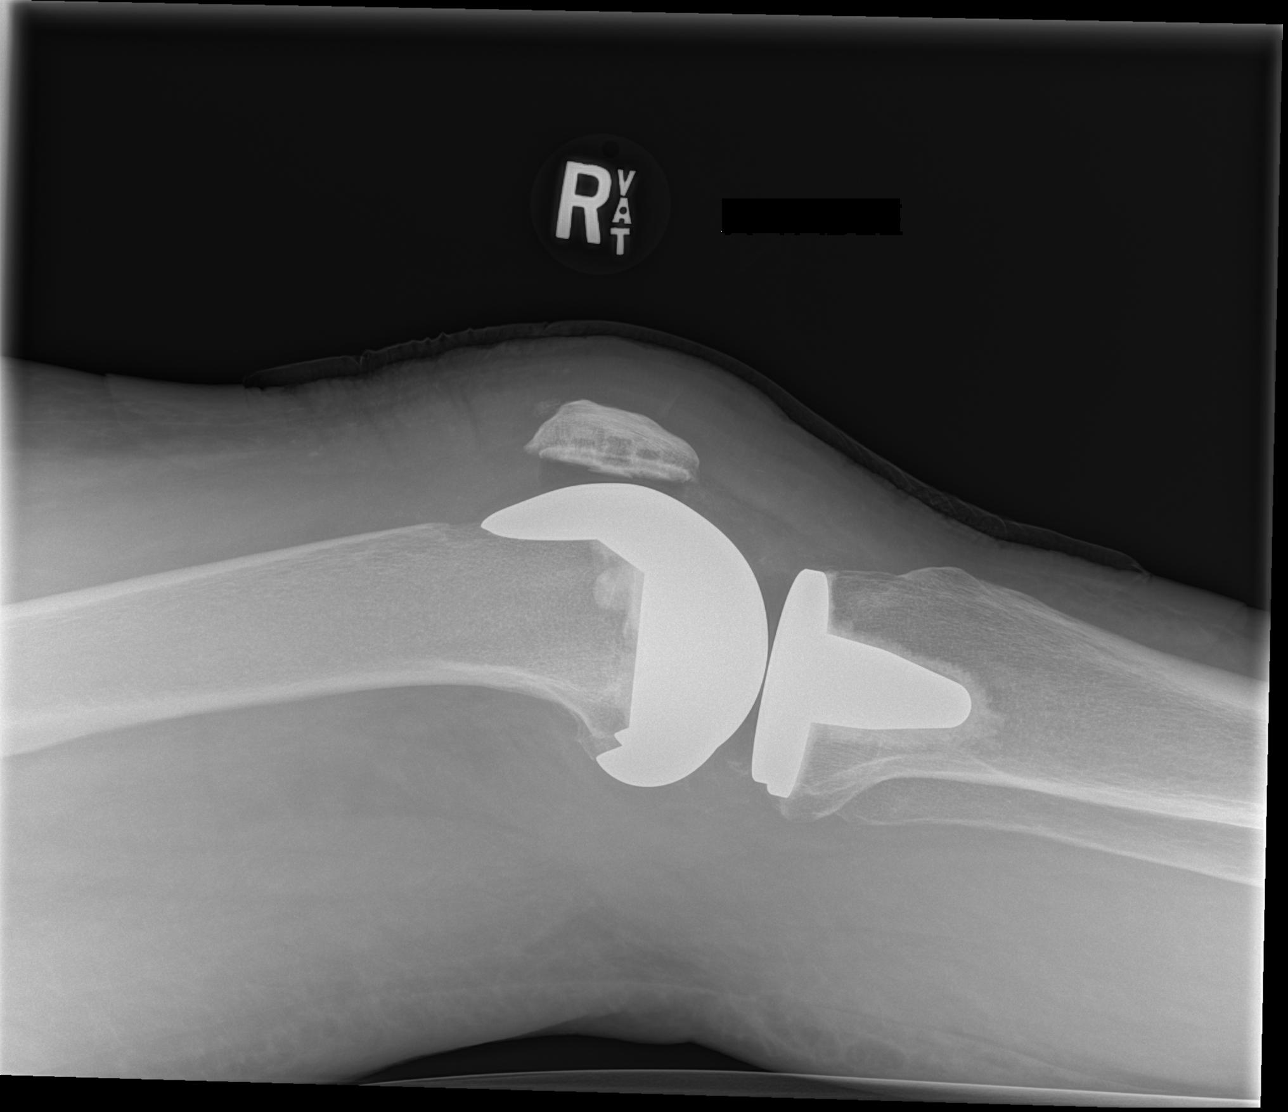

[4 of 4 positions shown; findings below may reference images not displayed]

FINDINGS: Status post knee replacement. Normal alignment and no fracture.
Generalized soft tissue swelling. Possible knee effusion.
IMPRESSION: 1. Status post right knee replacement without acute osseous
abnormality
2. Diffuse soft tissue swelling with possible knee effusion

## 2020-01-23 IMAGING — CT CT ANGIO CHEST
2 of 6 series · 19 of 36 positions shown · IV contrast (OMNIPAQUE 350)
Comparison: 04/05/2019

CLINICAL DATA: Shortness of breath with fever.  Recent knee surgery

EXAM:
CT ANGIOGRAPHY CHEST WITH CONTRAST
TECHNIQUE: Multidetector CT imaging of the chest was performed using the
standard protocol during bolus administration of intravenous
contrast. Multiplanar CT image reconstructions and MIPs were
obtained to evaluate the vascular anatomy.
CONTRAST:  100mL OMNIPAQUE IOHEXOL 350 MG/ML SOLN

[Series 5: thins · axial · 0.71mm/px · z∈[-38,+203]mm · 18 of 269 slices shown]
[im 14/269  lung]
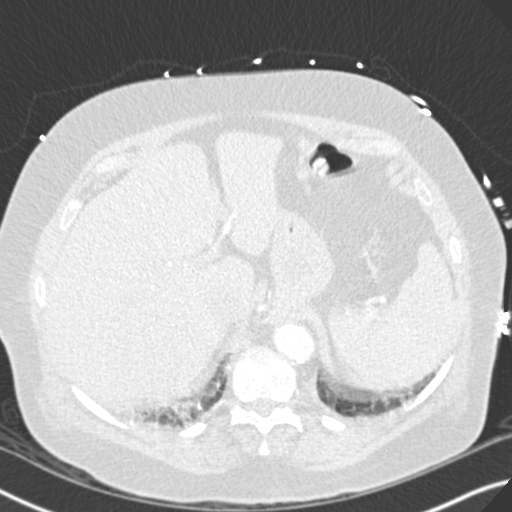
[im 27/269  mediastinal]
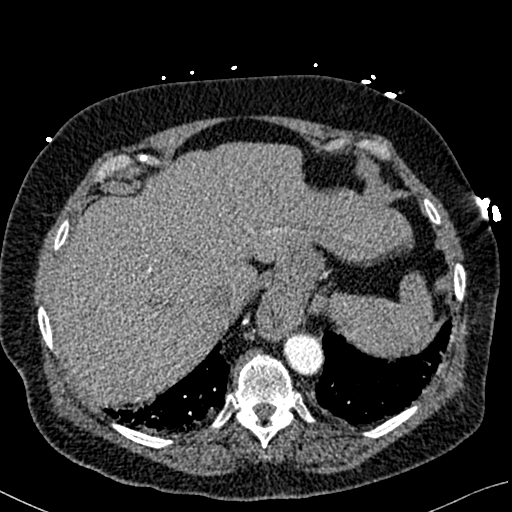
[im 41/269  lung]
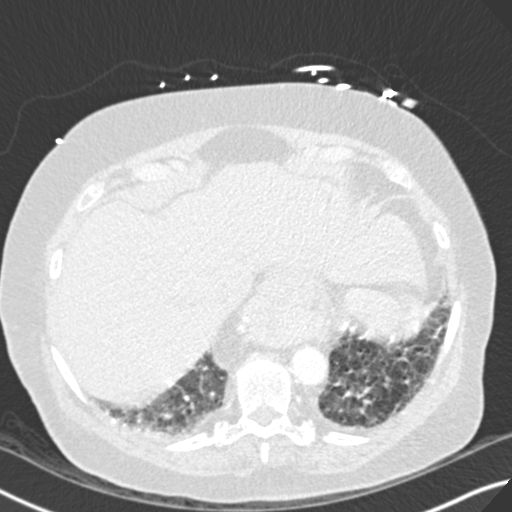
[im 54/269  mediastinal]
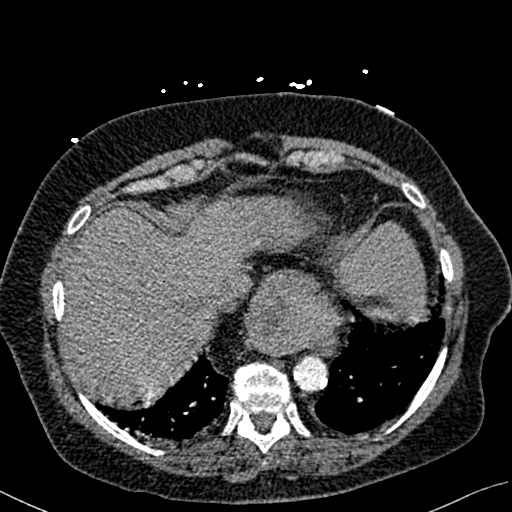
[im 68/269  lung]
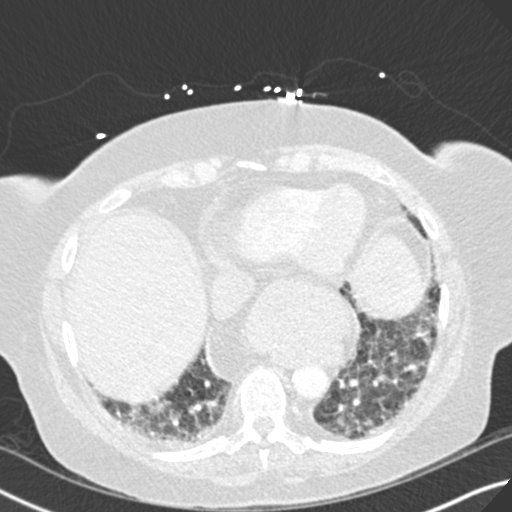
[im 81/269  mediastinal]
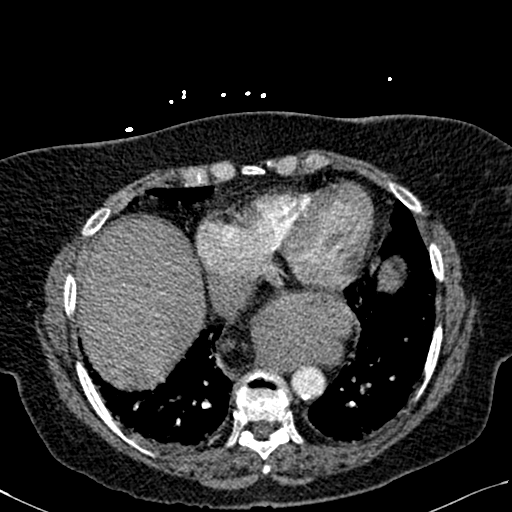
[im 94/269  lung]
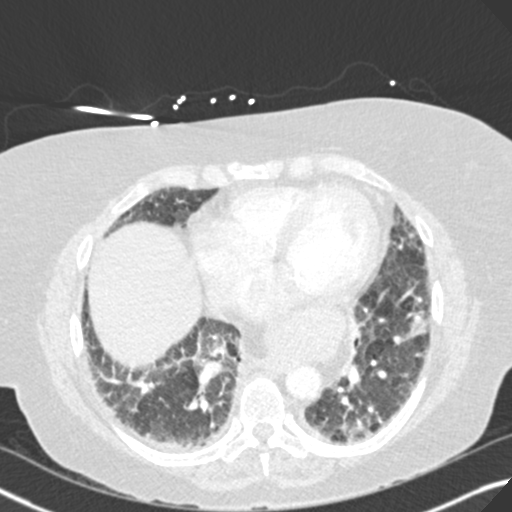
[im 108/269  mediastinal]
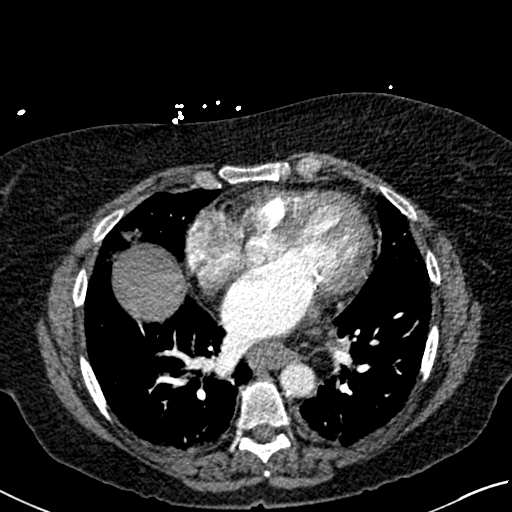
[im 121/269  lung]
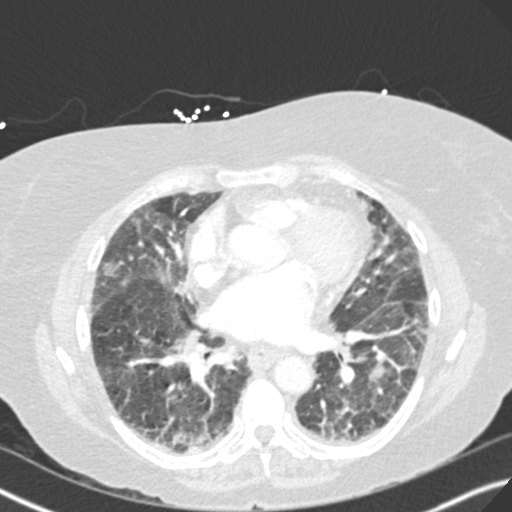
[im 148/269  mediastinal]
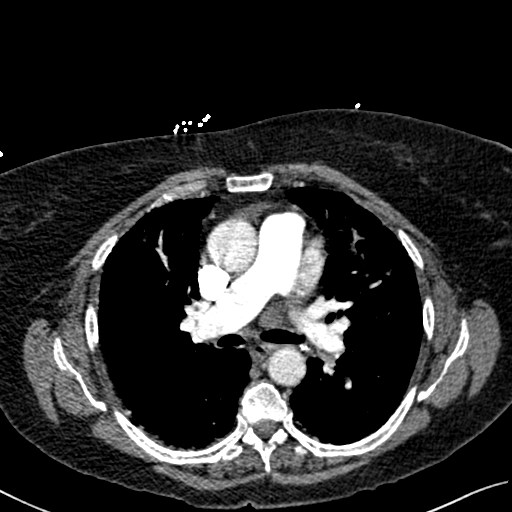
[im 161/269  lung]
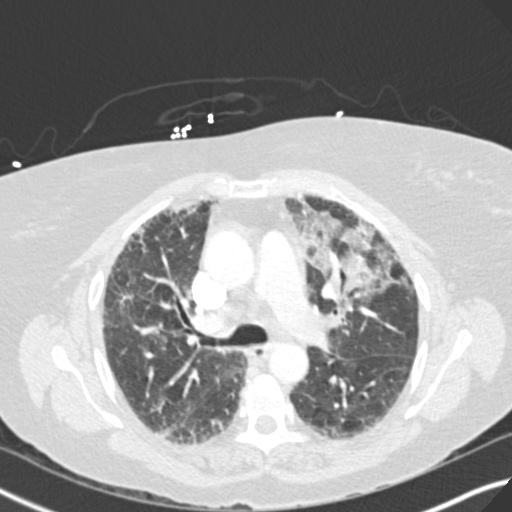
[im 175/269  mediastinal]
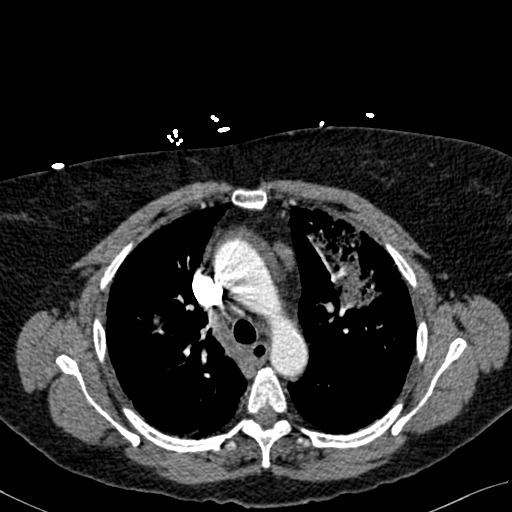
[im 188/269  lung]
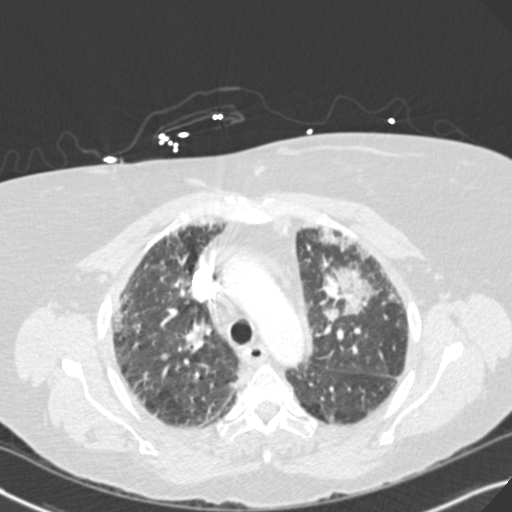
[im 202/269  mediastinal]
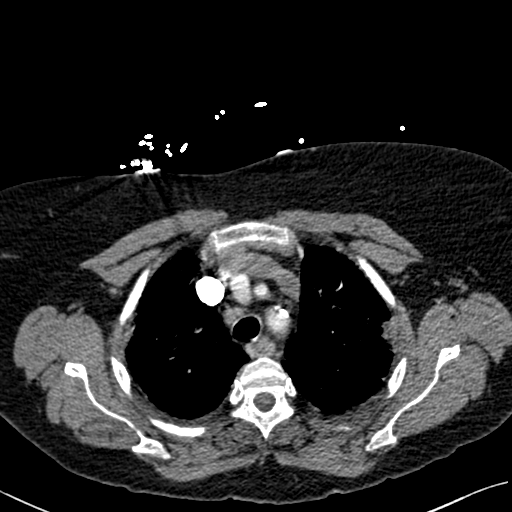
[im 215/269  lung]
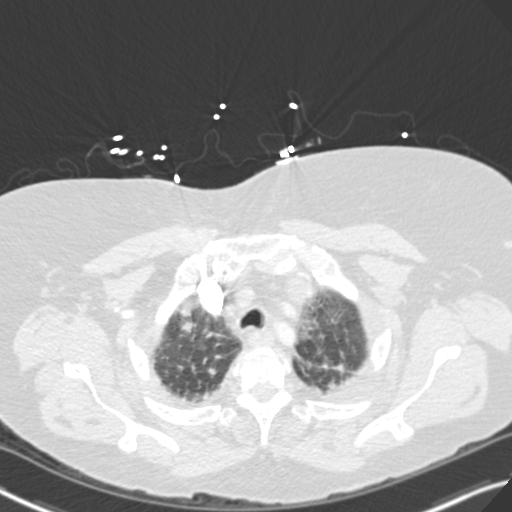
[im 228/269  mediastinal]
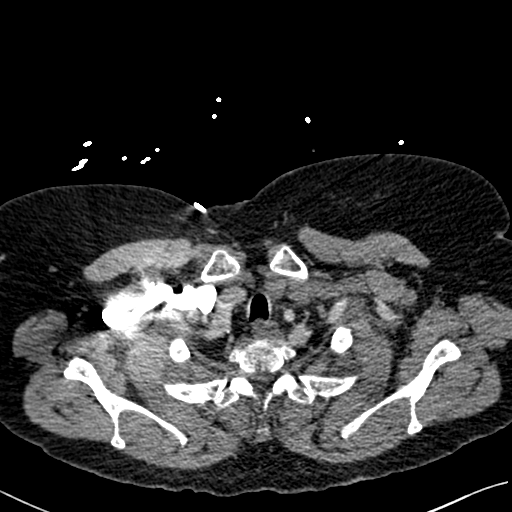
[im 242/269  lung]
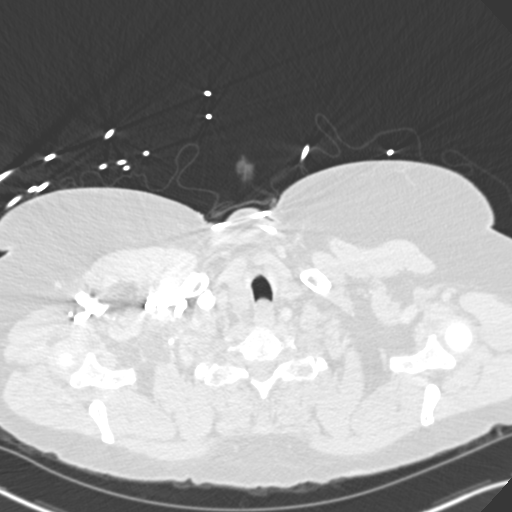
[im 255/269  mediastinal]
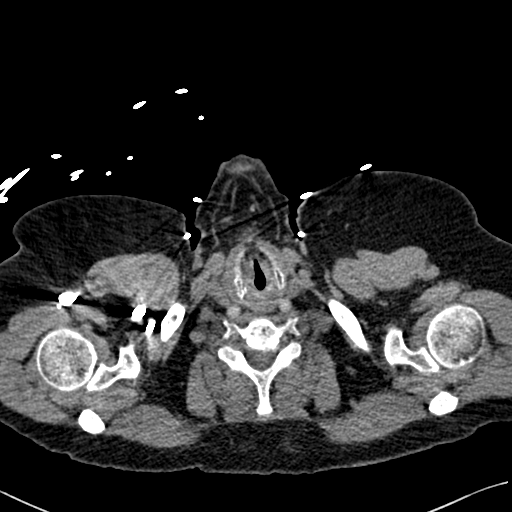

[Series 7: coronal mpr · coronal · 0.53mm/px · 1 of 136 slices shown]
[im 68/136  mediastinal]
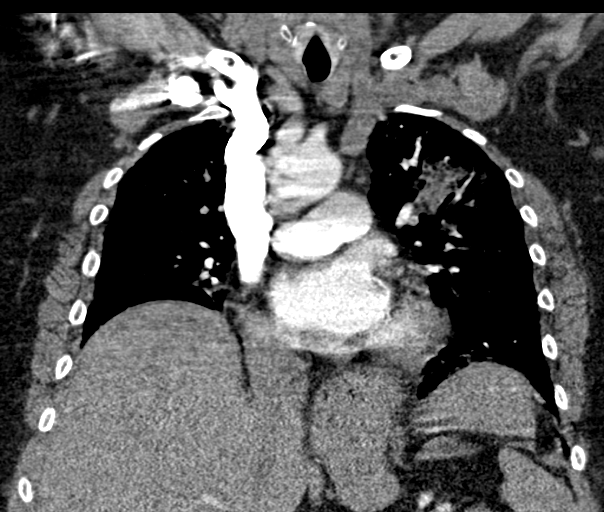

[19 of 36 positions shown; findings below may reference images not displayed]

FINDINGS: Cardiovascular: Suboptimal pulmonary artery opacification in the
upper lobes with no confidence for detecting emboli beyond the lobar
to proximal segmental level. The other vessels are well opacified
and negative for filling defect. Normal heart size. No pericardial
effusion. Mild atherosclerotic calcification of the aorta.

Mediastinum/Nodes: Moderate hiatal hernia. No worrisome lymph nodes.

Lungs/Pleura: There is a background of interstitial opacity with
architectural distortion and traction bronchiectasis on prior CT.
There is now superimposed patchy airspace disease in the bilateral
lungs. No acute septal thickening for edema. No pleural effusion or
pneumothorax

Upper Abdomen: No acute finding

Musculoskeletal: No acute or aggressive finding

Review of the MIP images confirms the above findings.
IMPRESSION: 1. Acute patchy airspace disease, favor atypical pneumonia. Given
there is a background of chronic interstitial lung disease acute
alveolitis is also considered.
2. No evidence for pulmonary embolism but notably limited in the
upper lobes due to bolus timing.
